# Patient Record
Sex: Female | Born: 1965 | Race: White | Hispanic: No | Marital: Married | Smoking: Former smoker
Health system: Southern US, Community
[De-identification: ages and names within clinical notes are randomized; demographics above are authoritative.]

## PROBLEM LIST (undated history)

## (undated) DIAGNOSIS — R5383 Other fatigue: Secondary | ICD-10-CM

## (undated) DIAGNOSIS — I219 Acute myocardial infarction, unspecified: Secondary | ICD-10-CM

## (undated) DIAGNOSIS — IMO0002 Reserved for concepts with insufficient information to code with codable children: Secondary | ICD-10-CM

## (undated) DIAGNOSIS — I25110 Atherosclerotic heart disease of native coronary artery with unstable angina pectoris: Secondary | ICD-10-CM

## (undated) DIAGNOSIS — Z951 Presence of aortocoronary bypass graft: Secondary | ICD-10-CM

## (undated) DIAGNOSIS — G35 Multiple sclerosis: Secondary | ICD-10-CM

## (undated) DIAGNOSIS — K589 Irritable bowel syndrome without diarrhea: Secondary | ICD-10-CM

## (undated) DIAGNOSIS — O24419 Gestational diabetes mellitus in pregnancy, unspecified control: Secondary | ICD-10-CM

## (undated) DIAGNOSIS — M51369 Other intervertebral disc degeneration, lumbar region without mention of lumbar back pain or lower extremity pain: Secondary | ICD-10-CM

## (undated) DIAGNOSIS — E669 Obesity, unspecified: Secondary | ICD-10-CM

## (undated) DIAGNOSIS — G35D Multiple sclerosis, unspecified: Secondary | ICD-10-CM

## (undated) DIAGNOSIS — M5136 Other intervertebral disc degeneration, lumbar region: Secondary | ICD-10-CM

## (undated) HISTORY — DX: Acute myocardial infarction, unspecified: I21.9

## (undated) HISTORY — DX: Gestational diabetes mellitus in pregnancy, unspecified control: O24.419

## (undated) HISTORY — PX: CHOLECYSTECTOMY: SHX55

## (undated) HISTORY — DX: Reserved for concepts with insufficient information to code with codable children: IMO0002

---

## 2001-05-15 ENCOUNTER — Other Ambulatory Visit: Admission: RE | Admit: 2001-05-15 | Discharge: 2001-05-15 | Payer: Self-pay | Admitting: Obstetrics and Gynecology

## 2002-03-14 ENCOUNTER — Emergency Department (HOSPITAL_COMMUNITY): Admission: EM | Admit: 2002-03-14 | Discharge: 2002-03-14 | Payer: Self-pay | Admitting: *Deleted

## 2002-03-26 DIAGNOSIS — IMO0002 Reserved for concepts with insufficient information to code with codable children: Secondary | ICD-10-CM

## 2002-03-26 DIAGNOSIS — R87619 Unspecified abnormal cytological findings in specimens from cervix uteri: Secondary | ICD-10-CM

## 2002-03-26 HISTORY — PX: CERVICAL CONE BIOPSY: SUR198

## 2002-03-26 HISTORY — DX: Unspecified abnormal cytological findings in specimens from cervix uteri: R87.619

## 2002-03-26 HISTORY — DX: Reserved for concepts with insufficient information to code with codable children: IMO0002

## 2002-03-26 HISTORY — PX: COLPOSCOPY: SHX161

## 2004-03-26 DIAGNOSIS — O24419 Gestational diabetes mellitus in pregnancy, unspecified control: Secondary | ICD-10-CM

## 2004-03-26 HISTORY — DX: Gestational diabetes mellitus in pregnancy, unspecified control: O24.419

## 2010-07-02 ENCOUNTER — Emergency Department (HOSPITAL_COMMUNITY): Payer: 59

## 2010-07-02 ENCOUNTER — Emergency Department (HOSPITAL_COMMUNITY)
Admission: EM | Admit: 2010-07-02 | Discharge: 2010-07-02 | Disposition: A | Discharge status: Home / Self Care | Payer: 59 | Attending: Emergency Medicine | Admitting: Emergency Medicine

## 2010-07-02 DIAGNOSIS — G35 Multiple sclerosis: Secondary | ICD-10-CM | POA: Insufficient documentation

## 2010-07-02 DIAGNOSIS — R0789 Other chest pain: Secondary | ICD-10-CM | POA: Insufficient documentation

## 2010-07-02 DIAGNOSIS — M546 Pain in thoracic spine: Secondary | ICD-10-CM | POA: Insufficient documentation

## 2010-07-02 LAB — COMPREHENSIVE METABOLIC PANEL
ALT: 27 U/L (ref 0–35)
AST: 28 U/L (ref 0–37)
Albumin: 3.3 g/dL — ABNORMAL LOW (ref 3.5–5.2)
Alkaline Phosphatase: 49 U/L (ref 39–117)
BUN: 7 mg/dL (ref 6–23)
CO2: 23 mEq/L (ref 19–32)
Calcium: 8.4 mg/dL (ref 8.4–10.5)
Chloride: 106 mEq/L (ref 96–112)
Creatinine, Ser: 0.53 mg/dL (ref 0.4–1.2)
GFR calc Af Amer: >60 mL/min (ref >60)
GFR calc non Af Amer: >60 mL/min (ref >60)
Glucose, Bld: 129 mg/dL — ABNORMAL HIGH (ref 70–99)
Potassium: 3.5 mEq/L (ref 3.5–5.1)
Sodium: 138 mEq/L (ref 135–145)
Total Bilirubin: 0.3 mg/dL (ref 0.3–1.2)
Total Protein: 6.4 g/dL (ref 6.0–8.3)

## 2010-07-02 LAB — URINALYSIS, ROUTINE W REFLEX MICROSCOPIC
Bilirubin Urine: NEGATIVE
Glucose, UA: NEGATIVE mg/dL
Hgb urine dipstick: NEGATIVE
Ketones, ur: NEGATIVE mg/dL
Nitrite: NEGATIVE
Protein, ur: NEGATIVE mg/dL
Specific Gravity, Urine: 1.013 (ref 1.005–1.030)
Urobilinogen, UA: 0.2 mg/dL (ref 0.0–1.0)
pH: 8 (ref 5.0–8.0)

## 2010-07-02 LAB — DIFFERENTIAL
Basophils Absolute: 0 10*3/uL (ref 0.0–0.1)
Basophils Relative: 0 % (ref 0–1)
Eosinophils Absolute: 0.1 10*3/uL (ref 0.0–0.7)
Eosinophils Relative: 3 % (ref 0–5)
Lymphocytes Relative: 29 % (ref 12–46)
Lymphs Abs: 1.3 10*3/uL (ref 0.7–4.0)
Monocytes Absolute: 0.5 10*3/uL (ref 0.1–1.0)
Monocytes Relative: 11 % (ref 3–12)
Neutro Abs: 2.6 10*3/uL (ref 1.7–7.7)
Neutrophils Relative %: 57 % (ref 43–77)

## 2010-07-02 LAB — CBC
HCT: 35.2 % — ABNORMAL LOW (ref 36.0–46.0)
Hemoglobin: 12.2 g/dL (ref 12.0–15.0)
MCH: 30.4 pg (ref 26.0–34.0)
MCHC: 34.7 g/dL (ref 30.0–36.0)
MCV: 87.8 fL (ref 78.0–100.0)
Platelets: 209 10*3/uL (ref 150–400)
RBC: 4.01 MIL/uL (ref 3.87–5.11)
RDW: 12.4 % (ref 11.5–15.5)
WBC: 4.6 10*3/uL (ref 4.0–10.5)

## 2010-07-02 LAB — POCT CARDIAC MARKERS
CKMB, poc: <1 ng/mL — ABNORMAL LOW (ref 1.0–8.0)
CKMB, poc: <1 ng/mL — ABNORMAL LOW (ref 1.0–8.0)
Myoglobin, poc: 35.5 ng/mL (ref 12–200)
Myoglobin, poc: 34.8 ng/mL (ref 12–200)
Troponin i, poc: <0.05 ng/mL (ref 0.00–0.09)
Troponin i, poc: <0.05 ng/mL (ref 0.00–0.09)

## 2010-07-02 LAB — D-DIMER, QUANTITATIVE: D-Dimer, Quant: 0.25 ug/mL-FEU (ref 0.00–0.48)

## 2010-07-02 LAB — POCT PREGNANCY, URINE: Preg Test, Ur: NEGATIVE

## 2010-07-03 LAB — URINE CULTURE
Colony Count: 75000
Culture  Setup Time: 201204082043

## 2011-06-20 ENCOUNTER — Other Ambulatory Visit: Payer: Self-pay | Admitting: Neurology

## 2011-07-02 ENCOUNTER — Other Ambulatory Visit: Payer: Self-pay | Admitting: Neurology

## 2011-07-04 ENCOUNTER — Other Ambulatory Visit: Payer: Self-pay | Admitting: Neurology

## 2011-07-04 ENCOUNTER — Encounter (HOSPITAL_COMMUNITY): Payer: Self-pay

## 2011-07-04 ENCOUNTER — Encounter (HOSPITAL_COMMUNITY)
Admission: RE | Admit: 2011-07-04 | Discharge: 2011-07-04 | Disposition: A | Discharge status: Home / Self Care | Payer: 59 | Source: Ambulatory Visit | Attending: Neurology | Admitting: Neurology

## 2011-07-04 DIAGNOSIS — G35 Multiple sclerosis: Secondary | ICD-10-CM | POA: Insufficient documentation

## 2011-07-04 HISTORY — DX: Multiple sclerosis, unspecified: G35.D

## 2011-07-04 HISTORY — DX: Multiple sclerosis: G35

## 2011-07-04 MED ORDER — DIPHENHYDRAMINE HCL 25 MG PO CAPS
ORAL_CAPSULE | ORAL | Status: AC
  Filled 2011-07-04: qty 1

## 2011-07-04 MED ORDER — LORATADINE 10 MG PO TABS
ORAL_TABLET | ORAL | Status: AC
  Administered 2011-07-04: 10 mg via ORAL
  Filled 2011-07-04: qty 1

## 2011-07-04 MED ORDER — SODIUM CHLORIDE 0.9 % IV SOLN
INTRAVENOUS | Status: AC
  Administered 2011-07-04: 11:00:00 via INTRAVENOUS

## 2011-07-04 MED ORDER — LORATADINE 10 MG PO TABS
10.0000 mg | ORAL_TABLET | Freq: Once | ORAL | Status: AC | PRN
  Administered 2011-07-04: 10 mg via ORAL

## 2011-07-04 MED ORDER — ACETAMINOPHEN 325 MG PO TABS
650.0000 mg | ORAL_TABLET | Freq: Four times a day (QID) | ORAL | Status: DC | PRN
  Administered 2011-07-04: 650 mg via ORAL

## 2011-07-04 MED ORDER — ACETAMINOPHEN 325 MG PO TABS
ORAL_TABLET | ORAL | Status: AC
  Administered 2011-07-04: 650 mg via ORAL
  Filled 2011-07-04: qty 2

## 2011-07-04 MED ORDER — DIPHENHYDRAMINE HCL 25 MG PO CAPS: 50.0000 mg | ORAL_CAPSULE | Freq: Four times a day (QID) | ORAL | Status: DC | PRN

## 2011-07-04 MED ORDER — SODIUM CHLORIDE 0.9 % IV SOLN
300.0000 mg | INTRAVENOUS | Status: DC
  Administered 2011-07-04: 300 mg via INTRAVENOUS
  Filled 2011-07-04: qty 15

## 2011-07-04 NOTE — Discharge Instructions (Signed)
Natalizumab injection What is this medicine? NATALIZUMAB (na ta LIZ you mab) is used to treat relapsing multiple sclerosis. This drug is not a cure. It is also used to treat Crohn's disease. This medicine may be used for other purposes; ask your health care provider or pharmacist if you have questions. What should I tell my health care provider before I take this medicine? They need to know if you have any of these conditions: -immune system problems -progressive multifocal leukoencephalopathy (PML) -an unusual or allergic reaction to natalizumab, other medicines, foods, dyes, or preservatives -pregnant or trying to get pregnant -breast-feeding How should I use this medicine? This medicine is for infusion into a vein. It is given by a health care professional in a hospital or clinic setting. A special MedGuide will be given to you by the pharmacist with each prescription and refill. Be sure to read this information carefully each time. Talk to your pediatrician regarding the use of this medicine in children. This medicine is not approved for use in children. Overdosage: If you think you have taken too much of this medicine contact a poison control center or emergency room at once. NOTE: This medicine is only for you. Do not share this medicine with others. What if I miss a dose? It is important not to miss your dose. Call your doctor or health care professional if you are unable to keep an appointment. What may interact with this medicine? -azathioprine -cyclosporine -interferon -6-mercaptopurine -methotrexate -steroid medicines like prednisone or cortisone -TNF-alpha inhibitors like adalimumab, etanercept, and infliximab -vaccines This list may not describe all possible interactions. Give your health care provider a list of all the medicines, herbs, non-prescription drugs, or dietary supplements you use. Also tell them if you smoke, drink alcohol, or use illegal drugs. Some items may  interact with your medicine. What should I watch for while using this medicine? Your condition will be monitored carefully while you are receiving this medicine. Visit your doctor for regular check ups. Tell your doctor or healthcare professional if your symptoms do not start to get better or if they get worse. Stay away from people who are sick. Call your doctor or health care professional for advice if you get a fever, chills or sore throat, or other symptoms of a cold or flu. Do not treat yourself. In some patients, this medicine may cause a serious brain infection that may cause death. If you have any problems seeing, thinking, speaking, walking, or standing, tell your doctor right away. If you cannot reach your doctor, get urgent medical care. What side effects may I notice from receiving this medicine? Side effects that you should report to your doctor or health care professional as soon as possible: -allergic reactions like skin rash, itching or hives, swelling of the face, lips, or tongue -breathing problems -changes in vision -chest pain -dark urine -depression, feelings of sadness -dizziness -general ill feeling or flu-like symptoms -irregular, missed, or painful menstrual periods -light-colored stools -loss of appetite, nausea -muscle weakness -problems with balance, talking, or walking -right upper belly pain -unusually weak or tired -yellowing of the eyes or skin Side effects that usually do not require medical attention (report to your doctor or health care professional if they continue or are bothersome): -aches, pains -headache -stomach upset -tiredness This list may not describe all possible side effects. Call your doctor for medical advice about side effects. You may report side effects to FDA at 1-800-FDA-1088. Where should I keep my medicine? This drug   is given in a hospital or clinic and will not be stored at home. NOTE: This sheet is a summary. It may not cover  all possible information. If you have questions about this medicine, talk to your doctor, pharmacist, or health care provider.  2012, Elsevier/Gold Standard. (05/01/2008 1:33:21 PM) 

## 2011-07-04 NOTE — Progress Notes (Signed)
Patient stable Tolerated infusion well. No signs of adverse reaction noted.

## 2011-07-31 ENCOUNTER — Other Ambulatory Visit: Payer: Self-pay | Admitting: Neurology

## 2011-08-01 ENCOUNTER — Encounter (HOSPITAL_COMMUNITY)
Admission: RE | Admit: 2011-08-01 | Discharge: 2011-08-01 | Disposition: A | Discharge status: Home / Self Care | Payer: 59 | Source: Ambulatory Visit | Attending: Neurology | Admitting: Neurology

## 2011-08-01 ENCOUNTER — Encounter (HOSPITAL_COMMUNITY): Payer: 59

## 2011-08-01 ENCOUNTER — Encounter (HOSPITAL_COMMUNITY): Payer: Self-pay

## 2011-08-01 DIAGNOSIS — G35 Multiple sclerosis: Secondary | ICD-10-CM | POA: Insufficient documentation

## 2011-08-01 MED ORDER — ACETAMINOPHEN 325 MG PO TABS
ORAL_TABLET | ORAL | Status: AC
  Administered 2011-08-01: 975 mg via ORAL
  Filled 2011-08-01: qty 3

## 2011-08-01 MED ORDER — LORATADINE 10 MG PO TABS
10.0000 mg | ORAL_TABLET | ORAL | Status: DC
  Administered 2011-08-01: 10 mg via ORAL

## 2011-08-01 MED ORDER — SODIUM CHLORIDE 0.9 % IV SOLN
INTRAVENOUS | Status: DC
  Administered 2011-08-01: 10:00:00 via INTRAVENOUS

## 2011-08-01 MED ORDER — ACETAMINOPHEN 500 MG PO TABS
1000.0000 mg | ORAL_TABLET | ORAL | Status: DC
  Administered 2011-08-01: 975 mg via ORAL

## 2011-08-01 MED ORDER — SODIUM CHLORIDE 0.9 % IV SOLN
300.0000 mg | INTRAVENOUS | Status: DC
  Administered 2011-08-01: 300 mg via INTRAVENOUS
  Filled 2011-08-01: qty 15

## 2011-08-01 MED ORDER — LORATADINE 10 MG PO TABS
ORAL_TABLET | ORAL | Status: AC
  Administered 2011-08-01: 10 mg via ORAL
  Filled 2011-08-01: qty 1

## 2011-08-01 NOTE — Progress Notes (Signed)
Tolerated Tysabri well. Verbalized understanding of d/c instructions. Home with daughter.

## 2011-08-01 NOTE — Discharge Instructions (Signed)
Natalizumab injection What is this medicine? NATALIZUMAB (na ta LIZ you mab) is used to treat relapsing multiple sclerosis. This drug is not a cure. It is also used to treat Crohn's disease. This medicine may be used for other purposes; ask your health care provider or pharmacist if you have questions. What should I tell my health care provider before I take this medicine? They need to know if you have any of these conditions: -immune system problems -progressive multifocal leukoencephalopathy (PML) -an unusual or allergic reaction to natalizumab, other medicines, foods, dyes, or preservatives -pregnant or trying to get pregnant -breast-feeding How should I use this medicine? This medicine is for infusion into a vein. It is given by a health care professional in a hospital or clinic setting. A special MedGuide will be given to you by the pharmacist with each prescription and refill. Be sure to read this information carefully each time. Talk to your pediatrician regarding the use of this medicine in children. This medicine is not approved for use in children. Overdosage: If you think you have taken too much of this medicine contact a poison control center or emergency room at once. NOTE: This medicine is only for you. Do not share this medicine with others. What if I miss a dose? It is important not to miss your dose. Call your doctor or health care professional if you are unable to keep an appointment. What may interact with this medicine? -azathioprine -cyclosporine -interferon -6-mercaptopurine -methotrexate -steroid medicines like prednisone or cortisone -TNF-alpha inhibitors like adalimumab, etanercept, and infliximab -vaccines This list may not describe all possible interactions. Give your health care provider a list of all the medicines, herbs, non-prescription drugs, or dietary supplements you use. Also tell them if you smoke, drink alcohol, or use illegal drugs. Some items may  interact with your medicine. What should I watch for while using this medicine? Your condition will be monitored carefully while you are receiving this medicine. Visit your doctor for regular check ups. Tell your doctor or healthcare professional if your symptoms do not start to get better or if they get worse. Stay away from people who are sick. Call your doctor or health care professional for advice if you get a fever, chills or sore throat, or other symptoms of a cold or flu. Do not treat yourself. In some patients, this medicine may cause a serious brain infection that may cause death. If you have any problems seeing, thinking, speaking, walking, or standing, tell your doctor right away. If you cannot reach your doctor, get urgent medical care. What side effects may I notice from receiving this medicine? Side effects that you should report to your doctor or health care professional as soon as possible: -allergic reactions like skin rash, itching or hives, swelling of the face, lips, or tongue -breathing problems -changes in vision -chest pain -dark urine -depression, feelings of sadness -dizziness -general ill feeling or flu-like symptoms -irregular, missed, or painful menstrual periods -light-colored stools -loss of appetite, nausea -muscle weakness -problems with balance, talking, or walking -right upper belly pain -unusually weak or tired -yellowing of the eyes or skin Side effects that usually do not require medical attention (report to your doctor or health care professional if they continue or are bothersome): -aches, pains -headache -stomach upset -tiredness This list may not describe all possible side effects. Call your doctor for medical advice about side effects. You may report side effects to FDA at 1-800-FDA-1088. Where should I keep my medicine? This drug   is given in a hospital or clinic and will not be stored at home. NOTE: This sheet is a summary. It may not cover  all possible information. If you have questions about this medicine, talk to your doctor, pharmacist, or health care provider.  2012, Elsevier/Gold Standard. (05/01/2008 1:33:21 PM) 

## 2011-08-06 DIAGNOSIS — D069 Carcinoma in situ of cervix, unspecified: Secondary | ICD-10-CM | POA: Insufficient documentation

## 2011-08-28 ENCOUNTER — Other Ambulatory Visit (HOSPITAL_COMMUNITY): Payer: Self-pay | Admitting: *Deleted

## 2011-08-29 ENCOUNTER — Encounter (HOSPITAL_COMMUNITY)
Admission: RE | Admit: 2011-08-29 | Discharge: 2011-08-29 | Disposition: A | Discharge status: Home / Self Care | Payer: 59 | Source: Ambulatory Visit | Attending: Neurology | Admitting: Neurology

## 2011-08-29 ENCOUNTER — Encounter (HOSPITAL_COMMUNITY): Payer: Self-pay

## 2011-08-29 DIAGNOSIS — G35 Multiple sclerosis: Secondary | ICD-10-CM | POA: Insufficient documentation

## 2011-08-29 MED ORDER — LORATADINE 10 MG PO TABS
ORAL_TABLET | ORAL | Status: AC
  Filled 2011-08-29: qty 1

## 2011-08-29 MED ORDER — SODIUM CHLORIDE 0.9 % IV SOLN
INTRAVENOUS | Status: DC
  Administered 2011-08-29: 250 mL via INTRAVENOUS

## 2011-08-29 MED ORDER — ACETAMINOPHEN 500 MG PO TABS
ORAL_TABLET | ORAL | Status: AC
  Filled 2011-08-29: qty 2

## 2011-08-29 MED ORDER — LORATADINE 10 MG PO TABS
10.0000 mg | ORAL_TABLET | Freq: Once | ORAL | Status: AC
  Administered 2011-08-29: 10 mg via ORAL

## 2011-08-29 MED ORDER — SODIUM CHLORIDE 0.9 % IV SOLN
300.0000 mg | INTRAVENOUS | Status: DC
  Administered 2011-08-29: 300 mg via INTRAVENOUS
  Filled 2011-08-29: qty 15

## 2011-08-29 MED ORDER — ACETAMINOPHEN 500 MG PO TABS
1000.0000 mg | ORAL_TABLET | ORAL | Status: DC
  Administered 2011-08-29: 1000 mg via ORAL

## 2011-08-29 MED ORDER — LORATADINE 5 MG/5ML PO SYRP: 10.0000 mg | ORAL_SOLUTION | ORAL | Status: DC

## 2011-08-29 NOTE — Discharge Instructions (Signed)
Natalizumab injection What is this medicine? NATALIZUMAB (na ta LIZ you mab) is used to treat relapsing multiple sclerosis. This drug is not a cure. It is also used to treat Crohn's disease. This medicine may be used for other purposes; ask your health care provider or pharmacist if you have questions. What should I tell my health care provider before I take this medicine? They need to know if you have any of these conditions: -immune system problems -progressive multifocal leukoencephalopathy (PML) -an unusual or allergic reaction to natalizumab, other medicines, foods, dyes, or preservatives -pregnant or trying to get pregnant -breast-feeding How should I use this medicine? This medicine is for infusion into a vein. It is given by a health care professional in a hospital or clinic setting. A special MedGuide will be given to you by the pharmacist with each prescription and refill. Be sure to read this information carefully each time. Talk to your pediatrician regarding the use of this medicine in children. This medicine is not approved for use in children. Overdosage: If you think you have taken too much of this medicine contact a poison control center or emergency room at once. NOTE: This medicine is only for you. Do not share this medicine with others. What if I miss a dose? It is important not to miss your dose. Call your doctor or health care professional if you are unable to keep an appointment. What may interact with this medicine? -azathioprine -cyclosporine -interferon -6-mercaptopurine -methotrexate -steroid medicines like prednisone or cortisone -TNF-alpha inhibitors like adalimumab, etanercept, and infliximab -vaccines This list may not describe all possible interactions. Give your health care provider a list of all the medicines, herbs, non-prescription drugs, or dietary supplements you use. Also tell them if you smoke, drink alcohol, or use illegal drugs. Some items may  interact with your medicine. What should I watch for while using this medicine? Your condition will be monitored carefully while you are receiving this medicine. Visit your doctor for regular check ups. Tell your doctor or healthcare professional if your symptoms do not start to get better or if they get worse. Stay away from people who are sick. Call your doctor or health care professional for advice if you get a fever, chills or sore throat, or other symptoms of a cold or flu. Do not treat yourself. In some patients, this medicine may cause a serious brain infection that may cause death. If you have any problems seeing, thinking, speaking, walking, or standing, tell your doctor right away. If you cannot reach your doctor, get urgent medical care. What side effects may I notice from receiving this medicine? Side effects that you should report to your doctor or health care professional as soon as possible: -allergic reactions like skin rash, itching or hives, swelling of the face, lips, or tongue -breathing problems -changes in vision -chest pain -dark urine -depression, feelings of sadness -dizziness -general ill feeling or flu-like symptoms -irregular, missed, or painful menstrual periods -light-colored stools -loss of appetite, nausea -muscle weakness -problems with balance, talking, or walking -right upper belly pain -unusually weak or tired -yellowing of the eyes or skin Side effects that usually do not require medical attention (report to your doctor or health care professional if they continue or are bothersome): -aches, pains -headache -stomach upset -tiredness This list may not describe all possible side effects. Call your doctor for medical advice about side effects. You may report side effects to FDA at 1-800-FDA-1088. Where should I keep my medicine? This drug   is given in a hospital or clinic and will not be stored at home. NOTE: This sheet is a summary. It may not cover  all possible information. If you have questions about this medicine, talk to your doctor, pharmacist, or health care provider.  2012, Elsevier/Gold Standard. (05/01/2008 1:33:21 PM) 

## 2011-09-26 ENCOUNTER — Encounter (HOSPITAL_COMMUNITY)
Admission: RE | Admit: 2011-09-26 | Discharge: 2011-09-26 | Disposition: A | Discharge status: Home / Self Care | Payer: 59 | Source: Ambulatory Visit | Attending: Neurology | Admitting: Neurology

## 2011-09-26 ENCOUNTER — Encounter (HOSPITAL_COMMUNITY): Payer: Self-pay

## 2011-09-26 DIAGNOSIS — G35 Multiple sclerosis: Secondary | ICD-10-CM | POA: Insufficient documentation

## 2011-09-26 HISTORY — DX: Other fatigue: R53.83

## 2011-09-26 MED ORDER — ACETAMINOPHEN 500 MG PO TABS
ORAL_TABLET | ORAL | Status: AC
  Filled 2011-09-26: qty 2

## 2011-09-26 MED ORDER — LORATADINE 10 MG PO TABS
10.0000 mg | ORAL_TABLET | Freq: Every day | ORAL | Status: DC
  Administered 2011-09-26: 10 mg via ORAL

## 2011-09-26 MED ORDER — LORATADINE 10 MG PO TABS
ORAL_TABLET | ORAL | Status: AC
  Filled 2011-09-26: qty 1

## 2011-09-26 MED ORDER — SODIUM CHLORIDE 0.9 % IV SOLN
INTRAVENOUS | Status: DC
  Administered 2011-09-26: 10:00:00 via INTRAVENOUS

## 2011-09-26 MED ORDER — ACETAMINOPHEN 500 MG PO TABS
1000.0000 mg | ORAL_TABLET | ORAL | Status: DC
  Administered 2011-09-26: 1000 mg via ORAL

## 2011-09-26 MED ORDER — SODIUM CHLORIDE 0.9 % IV SOLN
300.0000 mg | INTRAVENOUS | Status: DC
  Administered 2011-09-26: 300 mg via INTRAVENOUS
  Filled 2011-09-26: qty 15

## 2011-10-24 ENCOUNTER — Encounter (HOSPITAL_COMMUNITY): Payer: Self-pay

## 2011-10-24 ENCOUNTER — Encounter (HOSPITAL_COMMUNITY)
Admission: RE | Admit: 2011-10-24 | Discharge: 2011-10-24 | Disposition: A | Discharge status: Home / Self Care | Payer: 59 | Source: Ambulatory Visit | Attending: Neurology | Admitting: Neurology

## 2011-10-24 MED ORDER — LORATADINE 10 MG PO TABS
10.0000 mg | ORAL_TABLET | Freq: Every day | ORAL | Status: DC
  Administered 2011-10-24: 10 mg via ORAL
  Filled 2011-10-24: qty 1

## 2011-10-24 MED ORDER — EPINEPHRINE 0.3 MG/0.3ML IJ DEVI
0.3000 mg | Freq: Once | INTRAMUSCULAR | Status: DC
  Filled 2011-10-24: qty 0.6

## 2011-10-24 MED ORDER — SODIUM CHLORIDE 0.9 % IV SOLN
500.0000 mg | Freq: Once | INTRAVENOUS | Status: DC
  Filled 2011-10-24: qty 4

## 2011-10-24 MED ORDER — ACETAMINOPHEN 500 MG PO TABS
1000.0000 mg | ORAL_TABLET | ORAL | Status: AC
  Administered 2011-10-24: 1000 mg via ORAL
  Filled 2011-10-24: qty 2

## 2011-10-24 MED ORDER — SODIUM CHLORIDE 0.9 % IV SOLN
300.0000 mg | INTRAVENOUS | Status: AC
  Administered 2011-10-24: 300 mg via INTRAVENOUS
  Filled 2011-10-24: qty 15

## 2011-10-24 MED ORDER — SODIUM CHLORIDE 0.9 % IV SOLN
INTRAVENOUS | Status: AC
  Administered 2011-10-24: 10:00:00 via INTRAVENOUS

## 2011-11-21 ENCOUNTER — Encounter (HOSPITAL_COMMUNITY): Payer: Self-pay

## 2011-11-21 ENCOUNTER — Encounter (HOSPITAL_COMMUNITY)
Admission: RE | Admit: 2011-11-21 | Discharge: 2011-11-21 | Disposition: A | Discharge status: Home / Self Care | Payer: 59 | Source: Ambulatory Visit | Attending: Neurology | Admitting: Neurology

## 2011-11-21 DIAGNOSIS — G35 Multiple sclerosis: Secondary | ICD-10-CM | POA: Insufficient documentation

## 2011-11-21 MED ORDER — LORATADINE 10 MG PO TABS
10.0000 mg | ORAL_TABLET | ORAL | Status: DC
  Administered 2011-11-21: 10 mg via ORAL
  Filled 2011-11-21: qty 1

## 2011-11-21 MED ORDER — SODIUM CHLORIDE 0.9 % IV SOLN
INTRAVENOUS | Status: DC
  Administered 2011-11-21: 250 mL via INTRAVENOUS

## 2011-11-21 MED ORDER — SODIUM CHLORIDE 0.9 % IV SOLN
300.0000 mg | INTRAVENOUS | Status: DC
  Administered 2011-11-21: 300 mg via INTRAVENOUS
  Filled 2011-11-21: qty 15

## 2011-11-21 MED ORDER — ACETAMINOPHEN 500 MG PO TABS
1000.0000 mg | ORAL_TABLET | ORAL | Status: DC
  Administered 2011-11-21: 1000 mg via ORAL
  Filled 2011-11-21: qty 2

## 2011-12-19 ENCOUNTER — Encounter (HOSPITAL_COMMUNITY)
Admission: RE | Admit: 2011-12-19 | Discharge: 2011-12-19 | Disposition: A | Discharge status: Home / Self Care | Payer: 59 | Source: Ambulatory Visit | Attending: Neurology | Admitting: Neurology

## 2011-12-19 ENCOUNTER — Encounter (HOSPITAL_COMMUNITY): Payer: Self-pay

## 2011-12-19 DIAGNOSIS — G35 Multiple sclerosis: Secondary | ICD-10-CM | POA: Insufficient documentation

## 2011-12-19 MED ORDER — SODIUM CHLORIDE 0.9 % IV SOLN
300.0000 mg | INTRAVENOUS | Status: DC
  Administered 2011-12-19: 300 mg via INTRAVENOUS
  Filled 2011-12-19: qty 15

## 2011-12-19 MED ORDER — SODIUM CHLORIDE 0.9 % IV SOLN
INTRAVENOUS | Status: DC
  Administered 2011-12-19: 11:00:00 via INTRAVENOUS

## 2011-12-19 MED ORDER — ACETAMINOPHEN 500 MG PO TABS
1000.0000 mg | ORAL_TABLET | ORAL | Status: DC
  Administered 2011-12-19: 1000 mg via ORAL
  Filled 2011-12-19: qty 2

## 2011-12-19 MED ORDER — LORATADINE 10 MG PO TABS
10.0000 mg | ORAL_TABLET | ORAL | Status: DC
  Administered 2011-12-19: 10 mg via ORAL
  Filled 2011-12-19: qty 1

## 2011-12-19 NOTE — Progress Notes (Signed)
Pt's "runny nose" remains unchanged and still afebrile. She request to leave 30 minutes earlier than usual 1 hour observation period post Tysabri infusion because she wanted "to go home and take some allergy medication"

## 2011-12-19 NOTE — Progress Notes (Signed)
Pt has a "runny nose' clear secretion and is afebrile." Feels like it is allergy". Pt spoke with Dr Terrace Arabia and is to proceed with Tysabri unless she has a fever

## 2012-01-16 ENCOUNTER — Encounter (HOSPITAL_COMMUNITY)
Admission: RE | Admit: 2012-01-16 | Discharge: 2012-01-16 | Disposition: A | Discharge status: Home / Self Care | Payer: 59 | Source: Ambulatory Visit | Attending: Neurology | Admitting: Neurology

## 2012-01-16 ENCOUNTER — Encounter (HOSPITAL_COMMUNITY): Payer: Self-pay

## 2012-01-16 DIAGNOSIS — G35 Multiple sclerosis: Secondary | ICD-10-CM | POA: Insufficient documentation

## 2012-01-16 MED ORDER — LORATADINE 10 MG PO TABS
10.0000 mg | ORAL_TABLET | ORAL | Status: AC
  Administered 2012-01-16: 10 mg via ORAL
  Filled 2012-01-16: qty 1

## 2012-01-16 MED ORDER — ACETAMINOPHEN 500 MG PO TABS
1000.0000 mg | ORAL_TABLET | ORAL | Status: AC
  Administered 2012-01-16: 1000 mg via ORAL
  Filled 2012-01-16: qty 2

## 2012-01-16 MED ORDER — SODIUM CHLORIDE 0.9 % IV SOLN
300.0000 mg | INTRAVENOUS | Status: AC
  Administered 2012-01-16: 300 mg via INTRAVENOUS
  Filled 2012-01-16: qty 15

## 2012-01-16 MED ORDER — SODIUM CHLORIDE 0.9 % IV SOLN
INTRAVENOUS | Status: AC
  Administered 2012-01-16: 10:00:00 via INTRAVENOUS

## 2012-01-16 NOTE — Progress Notes (Addendum)
Post TYSABRI infusion. Pt declined to stay the entire 60 minutes observation time. Pt and daughter left after 30 minutes post infusion

## 2012-02-13 ENCOUNTER — Encounter (HOSPITAL_COMMUNITY): Payer: Self-pay

## 2012-02-13 ENCOUNTER — Encounter (HOSPITAL_COMMUNITY)
Admission: RE | Admit: 2012-02-13 | Discharge: 2012-02-13 | Disposition: A | Discharge status: Home / Self Care | Payer: 59 | Source: Ambulatory Visit | Attending: Neurology | Admitting: Neurology

## 2012-02-13 DIAGNOSIS — G35 Multiple sclerosis: Secondary | ICD-10-CM | POA: Insufficient documentation

## 2012-02-13 MED ORDER — SODIUM CHLORIDE 0.9 % IV SOLN
INTRAVENOUS | Status: AC
  Administered 2012-02-13: 10:00:00 via INTRAVENOUS

## 2012-02-13 MED ORDER — SODIUM CHLORIDE 0.9 % IV SOLN
300.0000 mg | INTRAVENOUS | Status: AC
  Administered 2012-02-13: 300 mg via INTRAVENOUS
  Filled 2012-02-13: qty 15

## 2012-02-13 MED ORDER — LORATADINE 10 MG PO TABS
10.0000 mg | ORAL_TABLET | ORAL | Status: AC
  Administered 2012-02-13: 10 mg via ORAL
  Filled 2012-02-13: qty 1

## 2012-02-13 MED ORDER — ACETAMINOPHEN 500 MG PO TABS
1000.0000 mg | ORAL_TABLET | ORAL | Status: AC
  Administered 2012-02-13: 1000 mg via ORAL
  Filled 2012-02-13: qty 2

## 2012-02-13 NOTE — Progress Notes (Signed)
Patient and daughter stayed 30 minutes post Tysabri infusion. Patient stated she "felt fine" and was unable to stay the remaining 30 minutes for observation. Home with daughter.

## 2012-03-10 ENCOUNTER — Other Ambulatory Visit: Payer: Self-pay | Admitting: Neurology

## 2012-03-12 ENCOUNTER — Encounter (HOSPITAL_COMMUNITY): Payer: Self-pay

## 2012-03-12 ENCOUNTER — Encounter (HOSPITAL_COMMUNITY)
Admission: RE | Admit: 2012-03-12 | Discharge: 2012-03-12 | Disposition: A | Discharge status: Home / Self Care | Payer: 59 | Source: Ambulatory Visit | Attending: Neurology | Admitting: Neurology

## 2012-03-12 DIAGNOSIS — G35 Multiple sclerosis: Secondary | ICD-10-CM | POA: Insufficient documentation

## 2012-03-12 MED ORDER — LORATADINE 10 MG PO TABS
10.0000 mg | ORAL_TABLET | ORAL | Status: DC
Start: 2012-03-12 — End: 2012-03-13
  Administered 2012-03-12: 10 mg via ORAL
  Filled 2012-03-12: qty 1

## 2012-03-12 MED ORDER — SODIUM CHLORIDE 0.9 % IV SOLN
300.0000 mg | INTRAVENOUS | Status: DC
  Administered 2012-03-12: 300 mg via INTRAVENOUS
  Filled 2012-03-12: qty 15

## 2012-03-12 MED ORDER — ACETAMINOPHEN 500 MG PO TABS
1000.0000 mg | ORAL_TABLET | ORAL | Status: DC
  Administered 2012-03-12: 1000 mg via ORAL
  Filled 2012-03-12: qty 2

## 2012-03-12 MED ORDER — METHYLPREDNISOLONE SODIUM SUCC 1000 MG IJ SOLR
500.0000 mg | INTRAMUSCULAR | Status: DC
  Filled 2012-03-12: qty 4

## 2012-03-12 MED ORDER — SODIUM CHLORIDE 0.9 % IV SOLN
INTRAVENOUS | Status: DC
  Administered 2012-03-12: 10:00:00 via INTRAVENOUS

## 2012-03-12 MED ORDER — EPINEPHRINE 0.3 MG/0.3ML IJ DEVI
0.3000 mg | INTRAMUSCULAR | Status: DC
  Filled 2012-03-12: qty 0.6

## 2012-04-09 ENCOUNTER — Encounter (HOSPITAL_COMMUNITY)
Admission: RE | Admit: 2012-04-09 | Discharge: 2012-04-09 | Disposition: A | Discharge status: Home / Self Care | Payer: Managed Care, Other (non HMO) | Source: Ambulatory Visit | Attending: Neurology | Admitting: Neurology

## 2012-04-09 ENCOUNTER — Encounter (HOSPITAL_COMMUNITY): Payer: Self-pay

## 2012-04-09 DIAGNOSIS — G35 Multiple sclerosis: Secondary | ICD-10-CM | POA: Insufficient documentation

## 2012-04-09 MED ORDER — LORATADINE 10 MG PO TABS
10.0000 mg | ORAL_TABLET | ORAL | Status: DC
  Administered 2012-04-09: 10 mg via ORAL
  Filled 2012-04-09: qty 1

## 2012-04-09 MED ORDER — ACETAMINOPHEN 500 MG PO TABS
1000.0000 mg | ORAL_TABLET | ORAL | Status: DC
  Administered 2012-04-09: 1000 mg via ORAL
  Filled 2012-04-09: qty 2

## 2012-04-09 MED ORDER — NATALIZUMAB 300 MG/15ML IV CONC
300.0000 mg | INTRAVENOUS | Status: DC
  Administered 2012-04-09: 300 mg via INTRAVENOUS
  Filled 2012-04-09: qty 15

## 2012-04-09 MED ORDER — SODIUM CHLORIDE 0.9 % IV SOLN
INTRAVENOUS | Status: DC
  Administered 2012-04-09: 10:00:00 via INTRAVENOUS

## 2012-04-09 NOTE — Progress Notes (Addendum)
Tolerated Tysabri well. No adverse reactions noted. Patient and daughter stayed 35 minutes post infusion. Declined to stay 60 minutes. Stated she felt fine and "really needed to leave after 30 minutes today." Instructed to call physician for concerns once she leaves Short Stay.

## 2012-05-09 ENCOUNTER — Encounter (HOSPITAL_COMMUNITY): Payer: Self-pay

## 2012-05-09 ENCOUNTER — Encounter (HOSPITAL_COMMUNITY)
Admission: RE | Admit: 2012-05-09 | Discharge: 2012-05-09 | Disposition: A | Discharge status: Home / Self Care | Payer: Managed Care, Other (non HMO) | Source: Ambulatory Visit | Attending: Neurology | Admitting: Neurology

## 2012-05-09 DIAGNOSIS — G35 Multiple sclerosis: Secondary | ICD-10-CM | POA: Insufficient documentation

## 2012-05-09 MED ORDER — ACETAMINOPHEN 500 MG PO TABS
1000.0000 mg | ORAL_TABLET | ORAL | Status: AC
  Administered 2012-05-09: 1000 mg via ORAL
  Filled 2012-05-09: qty 2

## 2012-05-09 MED ORDER — SODIUM CHLORIDE 0.9 % IV SOLN
INTRAVENOUS | Status: DC
  Administered 2012-05-09: 10:00:00 via INTRAVENOUS

## 2012-05-09 MED ORDER — LORATADINE 10 MG PO TABS
10.0000 mg | ORAL_TABLET | ORAL | Status: AC
  Administered 2012-05-09: 10 mg via ORAL
  Filled 2012-05-09: qty 1

## 2012-05-09 MED ORDER — SODIUM CHLORIDE 0.9 % IV SOLN
300.0000 mg | INTRAVENOUS | Status: AC
  Administered 2012-05-09: 300 mg via INTRAVENOUS
  Filled 2012-05-09: qty 15

## 2012-06-03 ENCOUNTER — Other Ambulatory Visit: Payer: Self-pay | Admitting: Neurology

## 2012-06-03 MED ORDER — SODIUM CHLORIDE 0.9 % IV SOLN: 300.0000 mg | INTRAVENOUS | Status: DC

## 2012-06-05 ENCOUNTER — Other Ambulatory Visit (HOSPITAL_COMMUNITY): Payer: Self-pay | Admitting: Neurology

## 2012-06-06 ENCOUNTER — Encounter (HOSPITAL_COMMUNITY): Payer: Self-pay

## 2012-06-06 ENCOUNTER — Encounter (HOSPITAL_COMMUNITY)
Admission: RE | Admit: 2012-06-06 | Discharge: 2012-06-06 | Disposition: A | Discharge status: Home / Self Care | Payer: Managed Care, Other (non HMO) | Source: Ambulatory Visit | Attending: Neurology | Admitting: Neurology

## 2012-06-06 DIAGNOSIS — G35 Multiple sclerosis: Secondary | ICD-10-CM | POA: Insufficient documentation

## 2012-06-06 MED ORDER — LORATADINE 10 MG PO TABS
10.0000 mg | ORAL_TABLET | ORAL | Status: DC
  Administered 2012-06-06: 10 mg via ORAL
  Filled 2012-06-06: qty 1

## 2012-06-06 MED ORDER — ACETAMINOPHEN 500 MG PO TABS
1000.0000 mg | ORAL_TABLET | ORAL | Status: DC
  Administered 2012-06-06: 1000 mg via ORAL
  Filled 2012-06-06: qty 2

## 2012-06-06 MED ORDER — SODIUM CHLORIDE 0.9 % IV SOLN
INTRAVENOUS | Status: DC
  Administered 2012-06-06: 10:00:00 via INTRAVENOUS

## 2012-06-06 MED ORDER — SODIUM CHLORIDE 0.9 % IV SOLN
300.0000 mg | INTRAVENOUS | Status: DC
  Administered 2012-06-06: 300 mg via INTRAVENOUS
  Filled 2012-06-06: qty 15

## 2012-06-06 NOTE — Progress Notes (Signed)
Post TYSABRI infusion. Pt does not want to stay the suggested 1 hour observation post infusion, She voices no c/o

## 2012-06-24 ENCOUNTER — Other Ambulatory Visit (HOSPITAL_COMMUNITY): Payer: Self-pay | Admitting: Neurology

## 2012-07-04 ENCOUNTER — Encounter (HOSPITAL_COMMUNITY)
Admission: RE | Admit: 2012-07-04 | Discharge: 2012-07-04 | Disposition: A | Discharge status: Home / Self Care | Payer: Managed Care, Other (non HMO) | Source: Ambulatory Visit | Attending: Neurology | Admitting: Neurology

## 2012-07-04 ENCOUNTER — Encounter (HOSPITAL_COMMUNITY): Payer: Self-pay

## 2012-07-04 DIAGNOSIS — G35 Multiple sclerosis: Secondary | ICD-10-CM | POA: Insufficient documentation

## 2012-07-04 MED ORDER — ACETAMINOPHEN 500 MG PO TABS
1000.0000 mg | ORAL_TABLET | ORAL | Status: DC
  Administered 2012-07-04: 1000 mg via ORAL
  Filled 2012-07-04: qty 2

## 2012-07-04 MED ORDER — LORATADINE 10 MG PO TABS
10.0000 mg | ORAL_TABLET | ORAL | Status: DC
  Administered 2012-07-04: 10 mg via ORAL
  Filled 2012-07-04: qty 1

## 2012-07-04 MED ORDER — SODIUM CHLORIDE 0.9 % IV SOLN
INTRAVENOUS | Status: DC
Start: 2012-07-04 — End: 2012-07-05
  Administered 2012-07-04: 20 mL/h via INTRAVENOUS

## 2012-07-04 MED ORDER — SODIUM CHLORIDE 0.9 % IV SOLN
300.0000 mg | INTRAVENOUS | Status: DC
  Administered 2012-07-04: 300 mg via INTRAVENOUS
  Filled 2012-07-04: qty 15

## 2012-07-31 ENCOUNTER — Telehealth: Payer: Self-pay | Admitting: *Deleted

## 2012-08-01 ENCOUNTER — Ambulatory Visit: Payer: Self-pay | Admitting: Nurse Practitioner

## 2012-08-01 ENCOUNTER — Encounter (HOSPITAL_COMMUNITY)
Admission: RE | Admit: 2012-08-01 | Discharge: 2012-08-01 | Disposition: A | Discharge status: Home / Self Care | Payer: Managed Care, Other (non HMO) | Source: Ambulatory Visit | Attending: Neurology | Admitting: Neurology

## 2012-08-01 ENCOUNTER — Encounter (HOSPITAL_COMMUNITY): Payer: Self-pay

## 2012-08-01 DIAGNOSIS — G35 Multiple sclerosis: Secondary | ICD-10-CM | POA: Insufficient documentation

## 2012-08-01 MED ORDER — SODIUM CHLORIDE 0.9 % IV SOLN
INTRAVENOUS | Status: DC
  Administered 2012-08-01: 11:00:00 via INTRAVENOUS

## 2012-08-01 MED ORDER — LORATADINE 10 MG PO TABS
10.0000 mg | ORAL_TABLET | ORAL | Status: DC
  Administered 2012-08-01: 10 mg via ORAL
  Filled 2012-08-01: qty 1

## 2012-08-01 MED ORDER — ACETAMINOPHEN 500 MG PO TABS
1000.0000 mg | ORAL_TABLET | ORAL | Status: DC
  Administered 2012-08-01: 1000 mg via ORAL
  Filled 2012-08-01: qty 2

## 2012-08-01 MED ORDER — SODIUM CHLORIDE 0.9 % IV SOLN
300.0000 mg | INTRAVENOUS | Status: DC
  Administered 2012-08-01: 300 mg via INTRAVENOUS
  Filled 2012-08-01: qty 15

## 2012-08-01 NOTE — Progress Notes (Signed)
Post TYSABRI infusion. Patient declines to stay the 1 hour post infusion observation period.

## 2012-08-19 NOTE — Telephone Encounter (Signed)
Closing encounter

## 2012-08-29 ENCOUNTER — Encounter (HOSPITAL_COMMUNITY): Payer: Self-pay

## 2012-08-29 ENCOUNTER — Encounter (HOSPITAL_COMMUNITY)
Admission: RE | Admit: 2012-08-29 | Discharge: 2012-08-29 | Disposition: A | Discharge status: Home / Self Care | Payer: Managed Care, Other (non HMO) | Source: Ambulatory Visit | Attending: Neurology | Admitting: Neurology

## 2012-08-29 DIAGNOSIS — G35 Multiple sclerosis: Secondary | ICD-10-CM | POA: Insufficient documentation

## 2012-08-29 MED ORDER — LORATADINE 10 MG PO TABS
10.0000 mg | ORAL_TABLET | ORAL | Status: AC
  Administered 2012-08-29: 10 mg via ORAL
  Filled 2012-08-29: qty 1

## 2012-08-29 MED ORDER — SODIUM CHLORIDE 0.9 % IV SOLN
INTRAVENOUS | Status: AC
  Administered 2012-08-29: 09:00:00 via INTRAVENOUS

## 2012-08-29 MED ORDER — SODIUM CHLORIDE 0.9 % IV SOLN
300.0000 mg | INTRAVENOUS | Status: AC
  Administered 2012-08-29: 300 mg via INTRAVENOUS
  Filled 2012-08-29: qty 15

## 2012-08-29 MED ORDER — ACETAMINOPHEN 500 MG PO TABS
ORAL_TABLET | ORAL | Status: AC
  Administered 2012-08-29: 1000 mg via ORAL
  Filled 2012-08-29: qty 2

## 2012-08-29 MED ORDER — ACETAMINOPHEN 500 MG PO TABS: 1000.0000 mg | ORAL_TABLET | ORAL | Status: AC

## 2012-09-01 ENCOUNTER — Encounter: Payer: Self-pay | Admitting: Neurology

## 2012-09-02 ENCOUNTER — Ambulatory Visit (INDEPENDENT_AMBULATORY_CARE_PROVIDER_SITE_OTHER): Payer: Managed Care, Other (non HMO) | Admitting: Neurology

## 2012-09-02 ENCOUNTER — Encounter: Payer: Self-pay | Admitting: Neurology

## 2012-09-02 VITALS — BP 111/69 | HR 71 | Ht 64.0 in | Wt 235.5 lb

## 2012-09-02 DIAGNOSIS — R269 Unspecified abnormalities of gait and mobility: Secondary | ICD-10-CM

## 2012-09-02 DIAGNOSIS — M25569 Pain in unspecified knee: Secondary | ICD-10-CM

## 2012-09-02 DIAGNOSIS — M25562 Pain in left knee: Secondary | ICD-10-CM

## 2012-09-02 DIAGNOSIS — G35 Multiple sclerosis: Secondary | ICD-10-CM

## 2012-09-02 MED ORDER — METHYLPHENIDATE HCL 10 MG PO TABS: 10.0000 mg | ORAL_TABLET | Freq: Three times a day (TID) | ORAL | Status: DC

## 2012-09-02 NOTE — Progress Notes (Signed)
History of Present Illness:   Kaylee Bryant is a 47 RH WF for continued care of RRMS.   She was diagnosed with RRMS in 2007, 6 month post partum, she had numbness from waist done, diagnosis was confirmed by marked abnormal MRI, I reviewed the film from [facility] in March,2007. There were multiple T2/Flair periventricular lesions, corpus callosum involvement, mutiple ring enhancing lesions. Also had enhancing lesion in cervical and thoracic spines.  She was treated at [facility] by Dr. Tinnie Gens, no csf study, but had extensive lab evaluations to rule out mimics.  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  She denies visual changes or other clear relapsing episodes.   MRI was done by Dr. Ninetta Lights in 01/2007, I have report only,MRI  brain, multiple T2/FLAIR lesions in both hemisphere with corpus callosum involvement, there are slight increase in lesion load. MRI cervical, stable lesions in C6-7, and C2-3. Thoracic, multiple non enhancing lesions.  She tolerated Rebif well, no flu like illness, but  complains of bilateral arm, and the lateral thigh lipoatrophy with repeat Rebif injection. There was no clear relapsed over the years, but she has slow worsening gait difficulty over the past 6 months.  Most recent MRI in Sept 2012,  MRI of thoracic spine Multiple chronic plaques at C7, T6-7, T8-9, T10.  MRI of cervical spine ,Multiple chronic demyelinating plaques at C3, C4 and C7. MRI of brain Multiple round and ovioid, periventricular and subcortical chronic demyelinating plaques.  2 plaques noted in the pons.  No abnormal lesions are seen on post contrast views.    Her JC-virus antibody was negative. After discuss, we started her on Tysarbri infusion since summer of 2013.  She  tolerated the infusion very well, but complains of excessive fatigue over the past few months, she still able to work full-time as a Engineer, civil (consulting), she complains of difficulty dragging herself out of bed.  Modafinil was not approved by insurance.She was put on Ritalin 10mg  tid, which helped her fatigue. feels much better on Tysabri.    UPDATE June 10th 2014: She fell twice a left knee since 2013, now presenting with 2 weeks history of left knee pain, worsening gait difficulty because of that, dragging her left leg more, she is [place]   Review of Systems  Out of a complete 14 system review, the patient complains of only the following symptoms, and all other reviewed systems are negative.   Constitutional:   N/A Cardiovascular:  N/A Ear/Nose/Throat:  N/A Skin: N/A Eyes: N/A Respiratory: N/A Gastroitestinal: N/A    Hematology/Lymphatic:  N/A Endocrine:  N/A Musculoskeletal: joints pain. Allergy/Immunology: N/A Neurological: gait difficulty Psychiatric:    N/A  Physical Exam  General: Well groomed and well nourished Neck: supple no carotid bruits Respiratory: LCA Cardiovascular: RRR  Neurologic Exam  Mental Status: pleasant, awake, alert, cooperative to history, talking, and casual conversation. Cranial Nerves: CN II-XII pupils were equal round reactive to light.  Fundi were sharp bilaterally.  Extraocular movements were full.  Visual fields were full on confrontational test.  Facial sensation and strength were normal.  Uvula tongue were midline.  Head turning and shoulder shrugging were normal and symmetric.  Tongue protrusion into the cheeks strength were normal.  Motor: Strength normal and symmetric upper and lower extremities  mild limitation on left lower extremity due to left knee pain. Sensory: length dependent decreased light touch, pinprick to toes, normal  proprioception, and  decreased vibratory sensation to toes. Coordination: Normal finger-to-nose, heel-to-shin.  There  was no dysmetria noticed. Gait and Station: wide based and stiff, atalgic due to left knee pain Reflexes: Deep tendon reflexes: Biceps: 2/2, Brachioradialis: 2/2, Triceps: 2/2, Pateller: 2/2, Achilles: 1/1.  Plantar responses are flexor.   Assessment and Plan:  47 y/o Caucasian female with RRMS, JV-virus Ab negative, on Tysarbri infusion since Feb 2013.next infusion 05/09/12    1.Continue  methylphenidate at 10mg  three times daily  2.repeat MRI of the brain w/wo, JC-virus antibody 3. refer her to orthopedic surgeon for evaluation of her left knee pain, 4 return to clinic with Kaylee Bryant in 6 months.

## 2012-09-08 ENCOUNTER — Telehealth: Payer: Self-pay

## 2012-09-08 NOTE — Telephone Encounter (Signed)
[name] from [place] called, left message.  Kaylee Bryant has been approved for 6 months, effective 09/04/2012-03/06/2013 Ref # [id number].  If there are any questions, he can be reached at [phone].

## 2012-09-29 ENCOUNTER — Encounter (HOSPITAL_COMMUNITY): Payer: Managed Care, Other (non HMO)

## 2012-09-30 ENCOUNTER — Encounter (HOSPITAL_COMMUNITY): Payer: Managed Care, Other (non HMO)

## 2012-09-30 ENCOUNTER — Telehealth: Payer: Self-pay | Admitting: Neurology

## 2012-09-30 ENCOUNTER — Other Ambulatory Visit: Payer: Self-pay | Admitting: Neurology

## 2012-09-30 DIAGNOSIS — G35 Multiple sclerosis: Secondary | ICD-10-CM

## 2012-09-30 NOTE — Telephone Encounter (Signed)
[facility] called and they need orders for Tysabri in Epic, patient appointment on Thursday

## 2012-10-03 ENCOUNTER — Encounter (HOSPITAL_COMMUNITY): Payer: Self-pay

## 2012-10-03 ENCOUNTER — Ambulatory Visit: Payer: Self-pay | Admitting: Neurology

## 2012-10-03 ENCOUNTER — Encounter (HOSPITAL_COMMUNITY)
Admission: RE | Admit: 2012-10-03 | Discharge: 2012-10-03 | Disposition: A | Discharge status: Home / Self Care | Payer: Managed Care, Other (non HMO) | Source: Ambulatory Visit | Attending: Neurology | Admitting: Neurology

## 2012-10-03 VITALS — BP 114/65 | HR 59 | Temp 98.4°F | Resp 20 | Ht 63.0 in | Wt 225.0 lb

## 2012-10-03 DIAGNOSIS — G35 Multiple sclerosis: Secondary | ICD-10-CM

## 2012-10-03 MED ORDER — SODIUM CHLORIDE 0.9 % IV SOLN
300.0000 mg | INTRAVENOUS | Status: DC
  Administered 2012-10-03: 300 mg via INTRAVENOUS
  Filled 2012-10-03: qty 15

## 2012-10-03 MED ORDER — LORATADINE 10 MG PO TABS
10.0000 mg | ORAL_TABLET | ORAL | Status: DC
  Administered 2012-10-03: 10 mg via ORAL
  Filled 2012-10-03: qty 1

## 2012-10-03 MED ORDER — SODIUM CHLORIDE 0.9 % IV SOLN
INTRAVENOUS | Status: DC
  Administered 2012-10-03: 20 mL/h via INTRAVENOUS

## 2012-10-03 MED ORDER — ACETAMINOPHEN 500 MG PO TABS
1000.0000 mg | ORAL_TABLET | ORAL | Status: DC
  Administered 2012-10-03: 1000 mg via ORAL
  Filled 2012-10-03: qty 2

## 2012-10-03 NOTE — Progress Notes (Signed)
Informed pt of the recommended time to stay post infusion of 1 hour and pt declines and states I will go when it is done.  Pt will call MD with any problems.

## 2012-10-08 ENCOUNTER — Other Ambulatory Visit: Payer: Self-pay

## 2012-10-08 ENCOUNTER — Telehealth: Payer: Self-pay | Admitting: Neurology

## 2012-10-08 MED ORDER — METHYLPHENIDATE HCL 10 MG PO TABS: 10.0000 mg | ORAL_TABLET | Freq: Three times a day (TID) | ORAL | Status: DC

## 2012-10-08 NOTE — Telephone Encounter (Signed)
Patient called requesting a refill on Ritalin.

## 2012-10-09 DIAGNOSIS — G35 Multiple sclerosis: Secondary | ICD-10-CM

## 2012-10-09 NOTE — Telephone Encounter (Signed)
Saw the orthopedic doctor yesterday and he wants to give her an injection in her knee. Wants to make sure this is okay to do with her current medications. Which is methylphenidate at 10mg  three times daily please advise.

## 2012-10-09 NOTE — Telephone Encounter (Signed)
Please call patient, it is Ok to proceed with her orthopedic treatment

## 2012-10-10 NOTE — Telephone Encounter (Signed)
I called and LMVM for pt that per Dr. Terrace Arabia , ok to proceed with orthopedic injection.  Pt to call back if needed.

## 2012-10-27 ENCOUNTER — Encounter (HOSPITAL_COMMUNITY): Payer: Managed Care, Other (non HMO)

## 2012-10-28 ENCOUNTER — Encounter (HOSPITAL_COMMUNITY): Payer: Managed Care, Other (non HMO)

## 2012-10-31 ENCOUNTER — Encounter (HOSPITAL_COMMUNITY): Payer: Self-pay

## 2012-10-31 ENCOUNTER — Encounter (HOSPITAL_COMMUNITY)
Admission: RE | Admit: 2012-10-31 | Discharge: 2012-10-31 | Disposition: A | Discharge status: Home / Self Care | Payer: Managed Care, Other (non HMO) | Source: Ambulatory Visit | Attending: Neurology | Admitting: Neurology

## 2012-10-31 VITALS — BP 142/78 | HR 55 | Temp 97.9°F | Resp 18

## 2012-10-31 DIAGNOSIS — G35 Multiple sclerosis: Secondary | ICD-10-CM | POA: Insufficient documentation

## 2012-10-31 MED ORDER — LORATADINE 10 MG PO TABS
10.0000 mg | ORAL_TABLET | ORAL | Status: DC
  Administered 2012-10-31: 10 mg via ORAL
  Filled 2012-10-31: qty 1

## 2012-10-31 MED ORDER — ACETAMINOPHEN 500 MG PO TABS
1000.0000 mg | ORAL_TABLET | ORAL | Status: DC
  Administered 2012-10-31: 1000 mg via ORAL
  Filled 2012-10-31: qty 2

## 2012-10-31 MED ORDER — SODIUM CHLORIDE 0.9 % IV SOLN
INTRAVENOUS | Status: DC
  Administered 2012-10-31: 250 mL via INTRAVENOUS

## 2012-10-31 MED ORDER — SODIUM CHLORIDE 0.9 % IV SOLN
300.0000 mg | INTRAVENOUS | Status: DC
  Administered 2012-10-31: 300 mg via INTRAVENOUS
  Filled 2012-10-31: qty 15

## 2012-10-31 NOTE — Progress Notes (Signed)
Advised pt to stay the 1 hour recommended time post tysabri infusion and pt states: "I'm gonna go ahead and leave".  Pt states she is aware to call MD for problems.

## 2012-11-03 DIAGNOSIS — Z0289 Encounter for other administrative examinations: Secondary | ICD-10-CM

## 2012-11-06 ENCOUNTER — Telehealth: Payer: Self-pay

## 2012-11-06 NOTE — Telephone Encounter (Signed)
I called patient and left a VM that FMLA forms are awaiting signature and then will be finished up today.

## 2012-11-18 ENCOUNTER — Other Ambulatory Visit: Payer: Self-pay | Admitting: Neurology

## 2012-11-28 ENCOUNTER — Encounter (HOSPITAL_COMMUNITY): Payer: Self-pay

## 2012-11-28 ENCOUNTER — Encounter (HOSPITAL_COMMUNITY)
Admission: RE | Admit: 2012-11-28 | Discharge: 2012-11-28 | Disposition: A | Discharge status: Home / Self Care | Payer: Managed Care, Other (non HMO) | Source: Ambulatory Visit | Attending: Neurology | Admitting: Neurology

## 2012-11-28 VITALS — BP 109/71 | HR 56 | Temp 97.2°F | Resp 18

## 2012-11-28 DIAGNOSIS — G35 Multiple sclerosis: Secondary | ICD-10-CM

## 2012-11-28 MED ORDER — SODIUM CHLORIDE 0.9 % IV SOLN
INTRAVENOUS | Status: AC
  Administered 2012-11-28: 09:00:00 via INTRAVENOUS

## 2012-11-28 MED ORDER — LORATADINE 10 MG PO TABS
10.0000 mg | ORAL_TABLET | ORAL | Status: AC
  Administered 2012-11-28: 10 mg via ORAL
  Filled 2012-11-28: qty 1

## 2012-11-28 MED ORDER — ACETAMINOPHEN 500 MG PO TABS
1000.0000 mg | ORAL_TABLET | ORAL | Status: AC
  Administered 2012-11-28: 1000 mg via ORAL
  Filled 2012-11-28: qty 2

## 2012-11-28 MED ORDER — SODIUM CHLORIDE 0.9 % IV SOLN
300.0000 mg | INTRAVENOUS | Status: AC
  Administered 2012-11-28: 300 mg via INTRAVENOUS
  Filled 2012-11-28: qty 15

## 2012-11-28 NOTE — Progress Notes (Signed)
Pt advised to stay the 1 hour recommended time post Tysabri infusion.  Pt states she is going to leave when done.  Pt knows to call MD for questions/concerns.

## 2012-12-24 ENCOUNTER — Other Ambulatory Visit: Payer: Self-pay | Admitting: Neurology

## 2012-12-24 ENCOUNTER — Ambulatory Visit (INDEPENDENT_AMBULATORY_CARE_PROVIDER_SITE_OTHER): Payer: Managed Care, Other (non HMO)

## 2012-12-24 DIAGNOSIS — G35 Multiple sclerosis: Secondary | ICD-10-CM

## 2012-12-24 DIAGNOSIS — M25562 Pain in left knee: Secondary | ICD-10-CM

## 2012-12-24 DIAGNOSIS — R269 Unspecified abnormalities of gait and mobility: Secondary | ICD-10-CM

## 2012-12-24 DIAGNOSIS — M25569 Pain in unspecified knee: Secondary | ICD-10-CM

## 2012-12-24 MED ORDER — METHYLPHENIDATE HCL 10 MG PO TABS: 10.0000 mg | ORAL_TABLET | Freq: Three times a day (TID) | ORAL | Status: DC

## 2012-12-24 MED ORDER — GADOPENTETATE DIMEGLUMINE 469.01 MG/ML IV SOLN: 20.0000 mL | Freq: Once | INTRAVENOUS | Status: AC | PRN

## 2012-12-26 ENCOUNTER — Encounter (HOSPITAL_COMMUNITY): Payer: Self-pay

## 2012-12-26 ENCOUNTER — Ambulatory Visit (HOSPITAL_COMMUNITY)
Admission: RE | Admit: 2012-12-26 | Discharge: 2012-12-26 | Disposition: A | Discharge status: Home / Self Care | Payer: Managed Care, Other (non HMO) | Source: Ambulatory Visit | Attending: Neurology | Admitting: Neurology

## 2012-12-26 VITALS — BP 117/59 | HR 61 | Temp 97.3°F | Resp 16

## 2012-12-26 DIAGNOSIS — G35 Multiple sclerosis: Secondary | ICD-10-CM | POA: Insufficient documentation

## 2012-12-26 MED ORDER — LORATADINE 10 MG PO TABS
10.0000 mg | ORAL_TABLET | ORAL | Status: AC
  Administered 2012-12-26: 10 mg via ORAL
  Filled 2012-12-26: qty 1

## 2012-12-26 MED ORDER — ACETAMINOPHEN 500 MG PO TABS
1000.0000 mg | ORAL_TABLET | ORAL | Status: AC
  Administered 2012-12-26: 1000 mg via ORAL
  Filled 2012-12-26: qty 2

## 2012-12-26 MED ORDER — SODIUM CHLORIDE 0.9 % IV SOLN
INTRAVENOUS | Status: AC
  Administered 2012-12-26: 10:00:00 via INTRAVENOUS

## 2012-12-26 MED ORDER — SODIUM CHLORIDE 0.9 % IV SOLN
300.0000 mg | INTRAVENOUS | Status: AC
  Administered 2012-12-26: 300 mg via INTRAVENOUS
  Filled 2012-12-26: qty 15

## 2012-12-26 NOTE — Progress Notes (Signed)
Quick Note:  Please call her,  MRI brain (with and without) demonstrating: 1. Multiple periventricular and subcortical chronic demyelinating plaques. 2. No acute plaques. 3. Mucosal thickening in the left maxillary sinus with bubbly secretions. 4. Compared to prior MRI on 01/09/12, no significant change.  ______

## 2013-01-12 NOTE — Progress Notes (Signed)
Quick Note:  Left message for patient that compared to MRI from 01-09-12, no significant changes. Told to call if she wants further details. ______

## 2013-01-21 ENCOUNTER — Telehealth: Payer: Self-pay | Admitting: Neurology

## 2013-01-21 ENCOUNTER — Other Ambulatory Visit: Payer: Self-pay | Admitting: Neurology

## 2013-01-21 DIAGNOSIS — G35 Multiple sclerosis: Secondary | ICD-10-CM

## 2013-01-21 NOTE — Telephone Encounter (Signed)
Dee from [place] called and needs order put in Epic for Tysabri, patient's appointment is tomorrow.

## 2013-01-23 ENCOUNTER — Ambulatory Visit (INDEPENDENT_AMBULATORY_CARE_PROVIDER_SITE_OTHER): Payer: Managed Care, Other (non HMO) | Admitting: Nurse Practitioner

## 2013-01-23 ENCOUNTER — Ambulatory Visit (HOSPITAL_COMMUNITY)
Admission: RE | Admit: 2013-01-23 | Discharge: 2013-01-23 | Disposition: A | Discharge status: Home / Self Care | Payer: Managed Care, Other (non HMO) | Source: Ambulatory Visit | Attending: Neurology | Admitting: Neurology

## 2013-01-23 ENCOUNTER — Encounter: Payer: Self-pay | Admitting: Nurse Practitioner

## 2013-01-23 ENCOUNTER — Encounter (HOSPITAL_COMMUNITY): Payer: Self-pay

## 2013-01-23 VITALS — BP 130/84 | HR 68 | Resp 16 | Ht 63.0 in | Wt 229.0 lb

## 2013-01-23 VITALS — BP 122/74 | HR 59 | Temp 97.0°F | Resp 16 | Ht 63.0 in | Wt 228.5 lb

## 2013-01-23 DIAGNOSIS — D229 Melanocytic nevi, unspecified: Secondary | ICD-10-CM

## 2013-01-23 DIAGNOSIS — Z0000 Encounter for general adult medical examination without abnormal findings: Secondary | ICD-10-CM

## 2013-01-23 DIAGNOSIS — G35 Multiple sclerosis: Secondary | ICD-10-CM

## 2013-01-23 DIAGNOSIS — D239 Other benign neoplasm of skin, unspecified: Secondary | ICD-10-CM

## 2013-01-23 DIAGNOSIS — Z23 Encounter for immunization: Secondary | ICD-10-CM

## 2013-01-23 DIAGNOSIS — IMO0002 Reserved for concepts with insufficient information to code with codable children: Secondary | ICD-10-CM

## 2013-01-23 DIAGNOSIS — R6889 Other general symptoms and signs: Secondary | ICD-10-CM

## 2013-01-23 DIAGNOSIS — N915 Oligomenorrhea, unspecified: Secondary | ICD-10-CM

## 2013-01-23 DIAGNOSIS — Z01419 Encounter for gynecological examination (general) (routine) without abnormal findings: Secondary | ICD-10-CM

## 2013-01-23 LAB — POCT URINALYSIS DIPSTICK
Bilirubin, UA: NEGATIVE
Blood, UA: NEGATIVE
Glucose, UA: NEGATIVE
Ketones, UA: NEGATIVE
Leukocytes, UA: NEGATIVE
Nitrite, UA: NEGATIVE
Protein, UA: NEGATIVE
Urobilinogen, UA: NEGATIVE
pH, UA: 7

## 2013-01-23 MED ORDER — MEDROXYPROGESTERONE ACETATE 10 MG PO TABS: 10.0000 mg | ORAL_TABLET | Freq: Every day | ORAL | Status: DC

## 2013-01-23 MED ORDER — LORATADINE 10 MG PO TABS
10.0000 mg | ORAL_TABLET | ORAL | Status: DC
  Administered 2013-01-23: 10 mg via ORAL
  Filled 2013-01-23: qty 1

## 2013-01-23 MED ORDER — SODIUM CHLORIDE 0.9 % IV SOLN
300.0000 mg | INTRAVENOUS | Status: DC
  Administered 2013-01-23: 300 mg via INTRAVENOUS
  Filled 2013-01-23: qty 15

## 2013-01-23 MED ORDER — ACETAMINOPHEN 500 MG PO TABS
1000.0000 mg | ORAL_TABLET | ORAL | Status: DC
  Administered 2013-01-23: 1000 mg via ORAL
  Filled 2013-01-23: qty 2

## 2013-01-23 MED ORDER — SODIUM CHLORIDE 0.9 % IV SOLN
INTRAVENOUS | Status: DC
  Administered 2013-01-23: 09:00:00 via INTRAVENOUS

## 2013-01-23 NOTE — Patient Instructions (Signed)

## 2013-01-23 NOTE — Progress Notes (Signed)
Patient ID: Kaylee Bryant, female   DOB: 1965-11-29, 47 y.o.   MRN: [id number] 47 y.o. [id number] Married Caucasian Fe here for annual exam.  Normal cycles last 7-9 days and fairly heavy that she took OCP for years.  Ever since starting Tysabri IV for MS her menses has only been every 6 months for about 18 months. Now cycle last 4 days, with being very heavy X 1 day. Super pad changing every 2 hours.  Husband with vasectomy.  She has felt PMS, and bloating like menses was going to start but never does. She does have a mole that she states has been present forever but now feels different and larger at left buttock area. She works as a Engineer, civil (consulting) in wound care.   Patient's last menstrual period was 06/24/2012.          Sexually active: yes  The current method of family planning is vasectomy.    Exercising: no  The patient does not participate in regular exercise at present. Smoker:  Former, quit in 2005  Health Maintenance: Pap:  06/2011, normal, history of abnormal and HPV 2004 MMG:  06/2011, normal TDaP:  ? Overdue, will update today Labs: labs at PCP.  Urine: normal   reports that she quit smoking about 9 years ago. Her smoking use included Cigarettes. She smoked 1.50 packs per day. She has never used smokeless tobacco. She reports that she drinks about 0.6 ounces of alcohol per week. She reports that she does not use illicit drugs.  Past Medical History  Diagnosis Date  . Multiple sclerosis   . Fatigue   . Abnormal Pap smear 2004    HPV, cone biopsy    Past Surgical History  Procedure Laterality Date  . Cholecystectomy      1988  . Cesarean section      1996, 2006  . Colposcopy  2004  . Cervical cone biopsy  2004    Current Outpatient Prescriptions  Medication Sig Dispense Refill  . aspirin 325 MG tablet Take 325 mg by mouth every 4 (four) hours as needed.       . baclofen (LIORESAL) 10 MG tablet TAKE ONE TABLET BY MOUTH THREE TIMES DAILY  90 tablet  11  . gabapentin (NEURONTIN) 300 MG  capsule TAKE TWO CAPSULES BY MOUTH AT BEDTIME  60 capsule  6  . ibuprofen (ADVIL,MOTRIN) 200 MG tablet Take 800 mg by mouth at bedtime as needed.      . indomethacin (INDOCIN) 25 MG capsule Take 50 mg by mouth 2 (two) times daily with a meal.      . methylphenidate (RITALIN) 10 MG tablet Take 1 tablet (10 mg total) by mouth 3 (three) times daily. Do not take last dose any later than 4pm  90 tablet  0  . natalizumab (TYSABRI) 300 MG/15ML injection Inject into the vein.      . sodium chloride 0.9 % SOLN 100 mL with natalizumab 300 MG/15ML CONC 300 mg Inject 300 mg into the vein every 28 (twenty-eight) days.  300 mg  3   No current facility-administered medications for this visit.   Facility-Administered Medications Ordered in Other Visits  Medication Dose Route Frequency Provider Last Rate Last Dose  . 0.9 %  sodium chloride infusion   Intravenous Q28 days Levert Feinstein, MD 50 mL/hr at 01/23/13 0912    . acetaminophen (TYLENOL) tablet 1,000 mg  1,000 mg Oral Q28 days Levert Feinstein, MD   1,000 mg at 01/23/13 0914  .  loratadine (CLARITIN) tablet 10 mg  10 mg Oral Q28 days Levert Feinstein, MD   10 mg at 01/23/13 0914  . natalizumab (TYSABRI) 300 mg in sodium chloride 0.9 % 100 mL IVPB  300 mg Intravenous Q28 days Levert Feinstein, MD   300 mg at 01/23/13 0919    Family History  Problem Relation Age of Onset  . Diabetes Mother   . Hypertension Mother   . Hypertension Father   . Diabetes Sister   . Breast cancer Maternal Aunt     ROS:  Pertinent items are noted in HPI.  Otherwise, a comprehensive ROS was negative.  Exam:   BP 130/84  Pulse 68  Resp 16  Ht 5\' 3"  (1.6 m)  Wt 229 lb (103.874 kg)  BMI 40.58 kg/m2  LMP 06/24/2012 Height: 5\' 3"  (160 cm)  Ht Readings from Last 3 Encounters:  01/23/13 5\' 3"  (1.6 m)  01/23/13 5\' 3"  (1.6 m)  10/03/12 5\' 3"  (1.6 m)    General appearance: alert, cooperative and appears stated age Head: Normocephalic, without obvious abnormality, atraumatic Neck: no adenopathy,  supple, symmetrical, trachea midline and thyroid normal to inspection and palpation Lungs: clear to auscultation bilaterally Breasts: normal appearance, no masses or tenderness Heart: regular rate and rhythm Abdomen: soft, non-tender; no masses,  no organomegaly Extremities: extremities normal, atraumatic, no cyanosis or edema Skin: Skin color, texture, turgor normal. No rashes or lesions Lymph nodes: Cervical, supraclavicular, and axillary nodes normal. No abnormal inguinal nodes palpated Neurologic: Grossly normal   Pelvic: External genitalia:  no lesions.  Mole on left inner perianal buttock area - irregular in shape and per patient has gotten larger.              Urethra:  normal appearing urethra with no masses, tenderness or lesions              Bartholin's and Skene's: normal                 Vagina: normal appearing vagina with normal color and discharge, no lesions              Cervix: anteverted              Pap taken: yes Bimanual Exam:  Uterus:  normal size, contour, position, consistency, mobility, non-tender              Adnexa: no mass, fullness, tenderness               Rectovaginal: Confirms               Anus:  normal sphincter tone, no lesions  A:  Well Woman with normal exam  History of menorrhagia  History of MS - currently on Tysabri for 18 months  Husband with vasectomy  Currently oligomenorrhea - at risk for endo hyperplasia  Remote history of abnormal pap in 1994 treated with cervical conization.  Mole change left buttock area   Update immunization today  P:   Pap smear as per guidelines Done today with HR HPV  Mammogram will schedule and list of facilities given to patient.  Discussed her care with Dr. Hyacinth Meeker - plan is to do a Provera challenge now - if her withdrawal is still moderate to light will plan to do Provera every 3  months.  If her withdrawal is significant she will call back, then may get further evaluation.  Patient understands and  agrees.  Referral to dermatologist for mole check  Counseled on  breast self exam, adequate intake of calcium and vitamin D, diet and exercise  TDaP given today  return annually or prn  An After Visit Summary was printed and given to the patient.

## 2013-01-25 DIAGNOSIS — D229 Melanocytic nevi, unspecified: Secondary | ICD-10-CM | POA: Insufficient documentation

## 2013-01-25 DIAGNOSIS — N915 Oligomenorrhea, unspecified: Secondary | ICD-10-CM | POA: Insufficient documentation

## 2013-01-25 DIAGNOSIS — IMO0002 Reserved for concepts with insufficient information to code with codable children: Secondary | ICD-10-CM | POA: Insufficient documentation

## 2013-01-26 NOTE — Progress Notes (Signed)
Reviewed personally.  M. Suzanne Miller, MD.  

## 2013-01-27 LAB — IPS PAP TEST WITH HPV

## 2013-01-28 ENCOUNTER — Telehealth: Payer: Self-pay | Admitting: *Deleted

## 2013-01-28 NOTE — Telephone Encounter (Signed)
I have attempted to contact this patient by phone with the following results: left message to return my call on answering machine (home).  

## 2013-01-28 NOTE — Telephone Encounter (Signed)
Message copied by Luisa Dago on Wed Jan 28, 2013 10:13 AM ------      Message from: Ria Comment R      Created: Tue Jan 27, 2013  6:22 PM       Let her know result of pap - good news about HPV  (previous HPV in 2004) ------

## 2013-01-29 NOTE — Telephone Encounter (Signed)
Returning call.

## 2013-01-29 NOTE — Telephone Encounter (Signed)
Notified of results in result note. 

## 2013-02-20 ENCOUNTER — Encounter (HOSPITAL_COMMUNITY): Payer: Self-pay

## 2013-02-20 ENCOUNTER — Ambulatory Visit (HOSPITAL_COMMUNITY)
Admission: RE | Admit: 2013-02-20 | Discharge: 2013-02-20 | Disposition: A | Discharge status: Home / Self Care | Payer: Managed Care, Other (non HMO) | Source: Ambulatory Visit | Attending: Neurology | Admitting: Neurology

## 2013-02-20 VITALS — BP 132/66 | HR 73 | Temp 97.6°F | Resp 18 | Ht 63.0 in | Wt 228.0 lb

## 2013-02-20 DIAGNOSIS — G35 Multiple sclerosis: Secondary | ICD-10-CM | POA: Insufficient documentation

## 2013-02-20 MED ORDER — LORATADINE 10 MG PO TABS
10.0000 mg | ORAL_TABLET | ORAL | Status: DC
  Administered 2013-02-20: 10 mg via ORAL
  Filled 2013-02-20: qty 1

## 2013-02-20 MED ORDER — SODIUM CHLORIDE 0.9 % IV SOLN
INTRAVENOUS | Status: DC
  Administered 2013-02-20: 10:00:00 via INTRAVENOUS

## 2013-02-20 MED ORDER — SODIUM CHLORIDE 0.9 % IV SOLN
300.0000 mg | INTRAVENOUS | Status: DC
  Administered 2013-02-20: 300 mg via INTRAVENOUS
  Filled 2013-02-20: qty 15

## 2013-02-20 MED ORDER — ACETAMINOPHEN 500 MG PO TABS
1000.0000 mg | ORAL_TABLET | ORAL | Status: DC
  Administered 2013-02-20: 1000 mg via ORAL
  Filled 2013-02-20: qty 2

## 2013-02-20 NOTE — Progress Notes (Signed)
Pt advised to stay the 1 hour post infusion.  Pt states she will go ahead and leave.  Pt advised to call physician with questions/concerns.  Pt voiced understanding.

## 2013-03-23 ENCOUNTER — Ambulatory Visit (HOSPITAL_COMMUNITY): Payer: Managed Care, Other (non HMO)

## 2013-03-25 ENCOUNTER — Encounter (HOSPITAL_COMMUNITY): Payer: Self-pay

## 2013-03-25 ENCOUNTER — Encounter (HOSPITAL_COMMUNITY)
Admission: RE | Admit: 2013-03-25 | Discharge: 2013-03-25 | Disposition: A | Discharge status: Home / Self Care | Payer: Managed Care, Other (non HMO) | Source: Ambulatory Visit | Attending: Neurology | Admitting: Neurology

## 2013-03-25 VITALS — BP 135/79 | HR 67 | Temp 97.3°F | Resp 16

## 2013-03-25 DIAGNOSIS — G35 Multiple sclerosis: Secondary | ICD-10-CM | POA: Insufficient documentation

## 2013-03-25 MED ORDER — ACETAMINOPHEN 500 MG PO TABS
1000.0000 mg | ORAL_TABLET | ORAL | Status: AC
  Administered 2013-03-25: 1000 mg via ORAL
  Filled 2013-03-25: qty 2

## 2013-03-25 MED ORDER — LORATADINE 10 MG PO TABS
10.0000 mg | ORAL_TABLET | ORAL | Status: AC
  Administered 2013-03-25: 10 mg via ORAL
  Filled 2013-03-25: qty 1

## 2013-03-25 MED ORDER — SODIUM CHLORIDE 0.9 % IV SOLN
300.0000 mg | INTRAVENOUS | Status: AC
  Administered 2013-03-25: 300 mg via INTRAVENOUS
  Filled 2013-03-25: qty 15

## 2013-03-25 MED ORDER — SODIUM CHLORIDE 0.9 % IV SOLN
INTRAVENOUS | Status: AC
  Administered 2013-03-25: 09:00:00 via INTRAVENOUS

## 2013-03-25 NOTE — Progress Notes (Signed)
Patient declined to stay for one hour observation post Tysabri. Home with husband. Aware to call MD if any problems or questions.

## 2013-04-02 NOTE — Telephone Encounter (Signed)
Completed.

## 2013-04-21 ENCOUNTER — Telehealth: Payer: Self-pay | Admitting: *Deleted

## 2013-04-21 ENCOUNTER — Other Ambulatory Visit: Payer: Self-pay | Admitting: Neurology

## 2013-04-21 DIAGNOSIS — G35 Multiple sclerosis: Secondary | ICD-10-CM

## 2013-04-21 NOTE — Telephone Encounter (Signed)
Dee from [facility] calling for tysabri orders.  Pt is due there on Thursday.

## 2013-04-21 NOTE — Telephone Encounter (Signed)
I have put orders in for her Tysarbri infusion

## 2013-04-22 NOTE — Telephone Encounter (Signed)
See attached note from Dr. Krista Blue

## 2013-04-23 NOTE — Telephone Encounter (Signed)
Patient scheduled for follow up on 05-04-13.

## 2013-04-24 ENCOUNTER — Encounter (HOSPITAL_COMMUNITY)
Admission: RE | Admit: 2013-04-24 | Discharge: 2013-04-24 | Disposition: A | Discharge status: Home / Self Care | Payer: Managed Care, Other (non HMO) | Source: Ambulatory Visit | Attending: Neurology | Admitting: Neurology

## 2013-04-24 ENCOUNTER — Encounter (HOSPITAL_COMMUNITY): Payer: Self-pay

## 2013-04-24 VITALS — BP 123/71 | HR 65 | Temp 98.1°F | Resp 16 | Ht 63.0 in | Wt 228.0 lb

## 2013-04-24 DIAGNOSIS — G35 Multiple sclerosis: Secondary | ICD-10-CM | POA: Insufficient documentation

## 2013-04-24 MED ORDER — SODIUM CHLORIDE 0.9 % IV SOLN
300.0000 mg | INTRAVENOUS | Status: DC
  Administered 2013-04-24: 300 mg via INTRAVENOUS
  Filled 2013-04-24: qty 15

## 2013-04-24 MED ORDER — SODIUM CHLORIDE 0.9 % IV SOLN
INTRAVENOUS | Status: DC
Start: 2013-04-24 — End: 2013-04-25
  Administered 2013-04-24: 11:00:00 via INTRAVENOUS

## 2013-04-24 MED ORDER — ACETAMINOPHEN 500 MG PO TABS
1000.0000 mg | ORAL_TABLET | ORAL | Status: DC
Start: 2013-04-24 — End: 2013-04-25
  Administered 2013-04-24: 1000 mg via ORAL
  Filled 2013-04-24: qty 2

## 2013-04-24 MED ORDER — LORATADINE 10 MG PO TABS
10.0000 mg | ORAL_TABLET | ORAL | Status: DC
  Administered 2013-04-24: 10 mg via ORAL
  Filled 2013-04-24: qty 1

## 2013-04-24 NOTE — Discharge Instructions (Signed)
Natalizumab injection °What is this medicine? °NATALIZUMAB (na ta LIZ you mab) is used to treat relapsing multiple sclerosis. This drug is not a cure. It is also used to treat Crohn's disease. °This medicine may be used for other purposes; ask your health care provider or pharmacist if you have questions. °COMMON BRAND NAME(S): Tysabri °What should I tell my health care provider before I take this medicine? °They need to know if you have any of these conditions: °-immune system problems °-progressive multifocal leukoencephalopathy (PML) °-an unusual or allergic reaction to natalizumab, other medicines, foods, dyes, or preservatives °-pregnant or trying to get pregnant °-breast-feeding °How should I use this medicine? °This medicine is for infusion into a vein. It is given by a health care professional in a hospital or clinic setting. °A special MedGuide will be given to you by the pharmacist with each prescription and refill. Be sure to read this information carefully each time. °Talk to your pediatrician regarding the use of this medicine in children. This medicine is not approved for use in children. °Overdosage: If you think you have taken too much of this medicine contact a poison control center or emergency room at once. °NOTE: This medicine is only for you. Do not share this medicine with others. °What if I miss a dose? °It is important not to miss your dose. Call your doctor or health care professional if you are unable to keep an appointment. °What may interact with this medicine? °-azathioprine °-cyclosporine °-interferon °-6-mercaptopurine °-methotrexate °-steroid medicines like prednisone or cortisone °-TNF-alpha inhibitors like adalimumab, etanercept, and infliximab °-vaccines °This list may not describe all possible interactions. Give your health care provider a list of all the medicines, herbs, non-prescription drugs, or dietary supplements you use. Also tell them if you smoke, drink alcohol, or use  illegal drugs. Some items may interact with your medicine. °What should I watch for while using this medicine? °Your condition will be monitored carefully while you are receiving this medicine. Visit your doctor for regular check ups. Tell your doctor or healthcare professional if your symptoms do not start to get better or if they get worse. °Stay away from people who are sick. Call your doctor or health care professional for advice if you get a fever, chills or sore throat, or other symptoms of a cold or flu. Do not treat yourself. °In some patients, this medicine may cause a serious brain infection that may cause death. If you have any problems seeing, thinking, speaking, walking, or standing, tell your doctor right away. If you cannot reach your doctor, get urgent medical care. °What side effects may I notice from receiving this medicine? °Side effects that you should report to your doctor or health care professional as soon as possible: °-allergic reactions like skin rash, itching or hives, swelling of the face, lips, or tongue °-breathing problems °-changes in vision °-chest pain °-dark urine °-depression, feelings of sadness °-dizziness °-general ill feeling or flu-like symptoms °-irregular, missed, or painful menstrual periods °-light-colored stools °-loss of appetite, nausea °-muscle weakness °-problems with balance, talking, or walking °-right upper belly pain °-unusually weak or tired °-yellowing of the eyes or skin °Side effects that usually do not require medical attention (report to your doctor or health care professional if they continue or are bothersome): °-aches, pains °-headache °-stomach upset °-tiredness °This list may not describe all possible side effects. Call your doctor for medical advice about side effects. You may report side effects to FDA at 1-800-FDA-1088. °Where should I keep   my medicine? °This drug is given in a hospital or clinic and will not be stored at home. °NOTE: This sheet is  a summary. It may not cover all possible information. If you have questions about this medicine, talk to your doctor, pharmacist, or health care provider. °© 2014, Elsevier/Gold Standard. (2008-05-01 13:33:21) °Multiple Sclerosis °Multiple sclerosis (MS) is a disease of the central nervous system. It leads to loss of the insulating covering of the nerves (myelin sheath) of your brain. When this happens, brain signals do not get transmitted properly or may not get transmitted at all. The symptoms of MS occur in episodes or attacks. These attacks may last weeks to months. There may be long periods of nearly no problems between attacks. The age of onset of MS varies.  °CAUSES °The cause of MS is unknown. However, it is more common in the [place] than in the southern [place]. °RISK FACTORS °There is a higher incidence of MS in women than in men. MS is not an inherited illness, although your risk of MS is higher if you have a relative with MS. °SIGNS AND SYMPTOMS  °The symptoms of MS occur in episodes or attacks. These attacks may last weeks to months. There may be long periods of almost no symptoms between attacks. °The symptoms of MS vary. This is because of the many different ways it affects the central nervous system. The main symptoms of MS include: °· Vision problems and eye pain. °· Numbness. °· Weakness. °· Paralysis in your arms, hands, feet, and legs (extremities). °· Balance problems. °· Tremors. °DIAGNOSIS  °Your health care provider can diagnose MS with the help of imaging exams and lab tests. These may include specialized X-ray exams and spinal fluid tests. The best imaging exam to confirm a diagnosis of MS is MRI. °TREATMENT  °There is no known cure for MS, but there are medicines that can decrease the number and frequency of attacks. Steroids are often used for short-term relief. Physical and occupational therapy may also help. °HOME CARE INSTRUCTIONS  °· Take medicines as directed by  your health care provider. °· Exercise as directed by your health care provider. °SEEK MEDICAL CARE IF: °You begin to feel depressed. °SEEK IMMEDIATE MEDICAL CARE IF: °· You develop paralysis. °· You develop problems with bladder, bowel, or sexual function. °· You develop mental changes, such as forgetfulness or mood swings. °· You have a seizure. °Document Released: 03/09/2000 Document Revised: 12/31/2012 Document Reviewed: 11/17/2012 °ExitCare® Patient Information ©2014 [employer]. ° ° °

## 2013-04-24 NOTE — Progress Notes (Signed)
Pt stayed 30 minutes of the suggested 1 hour observation time post Tysabri. She states she will notify he Dr for any problems questions or concerns

## 2013-05-04 ENCOUNTER — Encounter: Payer: Self-pay | Admitting: Nurse Practitioner

## 2013-05-04 ENCOUNTER — Ambulatory Visit (INDEPENDENT_AMBULATORY_CARE_PROVIDER_SITE_OTHER): Payer: Managed Care, Other (non HMO) | Admitting: Neurology

## 2013-05-04 ENCOUNTER — Ambulatory Visit (INDEPENDENT_AMBULATORY_CARE_PROVIDER_SITE_OTHER): Payer: Self-pay | Admitting: *Deleted

## 2013-05-04 ENCOUNTER — Encounter: Payer: Self-pay | Admitting: Neurology

## 2013-05-04 VITALS — BP 122/80 | HR 72 | Ht 66.0 in | Wt 240.0 lb

## 2013-05-04 DIAGNOSIS — G35 Multiple sclerosis: Secondary | ICD-10-CM

## 2013-05-04 DIAGNOSIS — Z0289 Encounter for other administrative examinations: Secondary | ICD-10-CM

## 2013-05-04 DIAGNOSIS — G35D Multiple sclerosis, unspecified: Secondary | ICD-10-CM

## 2013-05-04 MED ORDER — METHYLPHENIDATE HCL 10 MG PO TABS: 10.0000 mg | ORAL_TABLET | Freq: Three times a day (TID) | ORAL | Status: DC

## 2013-05-04 NOTE — Progress Notes (Addendum)
History of Present Illness:   Mrs. Tome is a 71 RH WF for continued care of RRMS.   She was diagnosed with RRMS in 2007, 6 month post partum, she had numbness from waist done, diagnosis was confirmed by marked abnormal MRI, I reviewed the film from [facility] in March,2007. There were multiple T2/Flair periventricular lesions, corpus callosum involvement, mutiple ring enhancing lesions. Also had enhancing lesion in cervical and thoracic spines.  She was treated at [facility] by Dr. Dellis Filbert, no csf study, but had extensive lab evaluations to rule out mimics.  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  She denies visual changes or other clear relapsing episodes.   MRI was done by Dr. Brandon Melnick in 01/2007, I have report only,MRI  brain, multiple T2/FLAIR lesions in both hemisphere with corpus callosum involvement, there are slight increase in lesion load. MRI cervical, stable lesions in C6-7, and C2-3. Thoracic, multiple non enhancing lesions.  She tolerated Rebif well, no flu like illness, but  complains of bilateral arm, and the lateral thigh lipoatrophy with repeat Rebif injection. There was no clear relapsed over the years, but she has slow worsening gait difficulty over the past 6 months.  Most recent MRI in Sept 2012,  MRI of thoracic spine Multiple chronic plaques at C7, T6-7, T8-9, T10.  MRI of cervical spine ,Multiple chronic demyelinating plaques at C3, C4 and C7. MRI of brain Multiple round and ovioid, periventricular and subcortical chronic demyelinating plaques.  2 plaques noted in the pons.  No abnormal lesions are seen on post contrast views.    Her JC-virus antibody was negative. After discuss, we started her on Tysarbri infusion since February of 2013.  She  tolerated the infusion very well, but complains of excessive fatigue over the past few months, she still able to work full-time as a Marine scientist, she complains of difficulty dragging herself out of bed.  Modafinil was not approved by insurance.She was put on Ritalin 10mg  tid, which helped her fatigue. feels much better on Tysabri.    JC-virus antibody negative (July 2014), Negative 0.17 in 11/11/2013,     UPDATE Feb 9th 2015: She is doing very well, her gait has improved, tolerating Tysarbri, if lower dose of Ritalin 5mg  tid.   Review of Systems  Out of a complete 14 system review, the patient complains of only the following symptoms, and all other reviewed systems are negative.  Mild gait difficulty    Physical Exam  General: Well groomed and well nourished Neck: supple no carotid bruits Respiratory: LCA Cardiovascular: RRR  Neurologic Exam  Mental Status: pleasant, awake, alert, cooperative to history, talking, and casual conversation. Cranial Nerves: CN II-XII pupils were equal round reactive to light.  Fundi were sharp bilaterally.  Extraocular movements were full.  Visual fields were full on confrontational test.  Facial sensation and strength were normal.  Uvula tongue were midline.  Head turning and shoulder shrugging were normal and symmetric.  Tongue protrusion into the cheeks strength were normal.  Motor: Strength normal and symmetric upper and lower extremities mild limitation on left lower extremity due to left knee pain. Sensory: length dependent decreased light touch, pinprick to toes, normal  proprioception, and  decreased vibratory sensation to toes. Coordination: Normal finger-to-nose, heel-to-shin.  There was no  dysmetria noticed. Gait and Station: wide based and stiff, steady is able to perform tiptoe, and heel walking Reflexes: Deep tendon reflexes: Biceps: 2/2, Brachioradialis: 2/2, Triceps: 2/2, Pateller: 2/2, Achilles: 1/1.  Plantar responses are flexor.   Assessment and  Plan:  48 y/o Caucasian female with RRMS, JV-virus Ab negative last July 2014, on Tysarbri infusion since Feb 2013.  1.Continue  methylphenidate at 5 to 10mg  three times daily  2.repeat MRI of the brain w/wo, JC-virus antibody 3. continue Tysabri infusion 4 return to clinic in 6 months with Hoyle Sauer will repeat MRI of brain with and without contrast, JC virus every 6 months, .

## 2013-05-04 NOTE — Progress Notes (Signed)
Pt here for appt with Dr. Krista Blue.  Order for JCV test.  Under aseptic technique 23g butterfly needle inserted to R AC and blood drawn for send out test to [facility].  Specimen placed in [facility] for p/u. [id number].

## 2013-05-13 NOTE — Telephone Encounter (Signed)
error 

## 2013-05-18 ENCOUNTER — Inpatient Hospital Stay: Admission: RE | Admit: 2013-05-18 | Payer: Managed Care, Other (non HMO) | Source: Ambulatory Visit

## 2013-05-22 ENCOUNTER — Encounter (HOSPITAL_COMMUNITY)
Admission: RE | Admit: 2013-05-22 | Discharge: 2013-05-22 | Disposition: A | Discharge status: Home / Self Care | Payer: Managed Care, Other (non HMO) | Source: Ambulatory Visit | Attending: Neurology | Admitting: Neurology

## 2013-05-22 ENCOUNTER — Encounter (HOSPITAL_COMMUNITY): Payer: Self-pay

## 2013-05-22 ENCOUNTER — Ambulatory Visit
Admission: RE | Admit: 2013-05-22 | Discharge: 2013-05-22 | Disposition: A | Discharge status: Home / Self Care | Payer: 59 | Source: Ambulatory Visit | Attending: Neurology | Admitting: Neurology

## 2013-05-22 VITALS — BP 118/70 | HR 67 | Temp 98.0°F | Resp 16

## 2013-05-22 DIAGNOSIS — G35 Multiple sclerosis: Secondary | ICD-10-CM

## 2013-05-22 MED ORDER — SODIUM CHLORIDE 0.9 % IV SOLN
INTRAVENOUS | Status: DC
Start: 2013-05-22 — End: 2013-05-23
  Administered 2013-05-22: 11:00:00 via INTRAVENOUS

## 2013-05-22 MED ORDER — NATALIZUMAB 300 MG/15ML IV CONC
300.0000 mg | INTRAVENOUS | Status: DC
Start: 2013-05-22 — End: 2013-05-23
  Administered 2013-05-22: 300 mg via INTRAVENOUS
  Filled 2013-05-22: qty 15

## 2013-05-22 MED ORDER — LORATADINE 10 MG PO TABS
10.0000 mg | ORAL_TABLET | ORAL | Status: DC
  Administered 2013-05-22: 10 mg via ORAL
  Filled 2013-05-22: qty 1

## 2013-05-22 MED ORDER — ACETAMINOPHEN 500 MG PO TABS
1000.0000 mg | ORAL_TABLET | ORAL | Status: DC
  Administered 2013-05-22: 1000 mg via ORAL
  Filled 2013-05-22: qty 2

## 2013-05-22 MED ORDER — GADOBENATE DIMEGLUMINE 529 MG/ML IV SOLN
20.0000 mL | Freq: Once | INTRAVENOUS | Status: AC | PRN
  Administered 2013-05-22: 20 mL via INTRAVENOUS

## 2013-05-22 NOTE — Discharge Instructions (Signed)

## 2013-05-26 ENCOUNTER — Telehealth: Payer: Self-pay | Admitting: Neurology

## 2013-05-26 NOTE — Telephone Encounter (Signed)
Kaylee Bryant: Please call patient, I was able to review her MRI of the brain, compared to previous scan, there was no significant worsening, continued evidence of multiple sclerosis, continue current treatment

## 2013-05-28 NOTE — Telephone Encounter (Signed)
Left message with MRI brain results, per Dr. Krista Blue.  Told to call with further questions.

## 2013-06-01 ENCOUNTER — Telehealth: Payer: Self-pay | Admitting: Nurse Practitioner

## 2013-06-01 NOTE — Telephone Encounter (Signed)
Patient is having severe right side pain. Patient would like an appointment this week.

## 2013-06-01 NOTE — Telephone Encounter (Signed)
Spoke with pt who has been having pains in her side sporadically for several months. She has seen her PCP about it and he thought it could be colon spasms related to her MS. This month the pains have been worse, and feel more like cramping and ovary pain. Pt last took provera in mid February and did not have a period yet. Pt takes ibuprofen for her MS on a regular basis. Reports the pains come and go, and now they have increased so that she cannot lay on her right side. Pt denies fever or urinary symptoms. Offered pt OV today, but she is at work. Pt requested OV tomorrow afternoon with PG. Sched appt tomorrow at 3:30 with PG. Advised pt to go to Urgent Care or ER if pain becomes excruciating. Pt agreeable.

## 2013-06-02 ENCOUNTER — Encounter: Payer: Self-pay | Admitting: Nurse Practitioner

## 2013-06-02 ENCOUNTER — Ambulatory Visit (INDEPENDENT_AMBULATORY_CARE_PROVIDER_SITE_OTHER): Payer: Managed Care, Other (non HMO) | Admitting: Nurse Practitioner

## 2013-06-02 VITALS — BP 120/76 | HR 68 | Temp 97.8°F | Ht 63.0 in | Wt 240.0 lb

## 2013-06-02 DIAGNOSIS — Z9089 Acquired absence of other organs: Secondary | ICD-10-CM

## 2013-06-02 DIAGNOSIS — R109 Unspecified abdominal pain: Secondary | ICD-10-CM

## 2013-06-02 DIAGNOSIS — Z9049 Acquired absence of other specified parts of digestive tract: Secondary | ICD-10-CM

## 2013-06-02 DIAGNOSIS — K589 Irritable bowel syndrome without diarrhea: Secondary | ICD-10-CM

## 2013-06-02 DIAGNOSIS — G35 Multiple sclerosis: Secondary | ICD-10-CM

## 2013-06-02 DIAGNOSIS — N915 Oligomenorrhea, unspecified: Secondary | ICD-10-CM

## 2013-06-02 DIAGNOSIS — G35D Multiple sclerosis, unspecified: Secondary | ICD-10-CM

## 2013-06-02 LAB — CBC WITH DIFFERENTIAL/PLATELET
Basophils Absolute: 0.1 10*3/uL (ref 0.0–0.1)
Basophils Relative: 1 % (ref 0–1)
Eosinophils Absolute: 0.2 10*3/uL (ref 0.0–0.7)
Eosinophils Relative: 2 % (ref 0–5)
HCT: 38.8 % (ref 36.0–46.0)
Hemoglobin: 13.4 g/dL (ref 12.0–15.0)
Lymphocytes Relative: 49 % — ABNORMAL HIGH (ref 12–46)
Lymphs Abs: 4.9 10*3/uL — ABNORMAL HIGH (ref 0.7–4.0)
MCH: 29.6 pg (ref 26.0–34.0)
MCHC: 34.5 g/dL (ref 30.0–36.0)
MCV: 85.7 fL (ref 78.0–100.0)
Monocytes Absolute: 0.8 10*3/uL (ref 0.1–1.0)
Monocytes Relative: 8 % (ref 3–12)
Neutro Abs: 4 10*3/uL (ref 1.7–7.7)
Neutrophils Relative %: 40 % — ABNORMAL LOW (ref 43–77)
Platelets: 334 10*3/uL (ref 150–400)
RBC: 4.53 MIL/uL (ref 3.87–5.11)
RDW: 14.3 % (ref 11.5–15.5)
WBC: 10.1 10*3/uL (ref 4.0–10.5)

## 2013-06-02 LAB — POCT URINALYSIS DIPSTICK
Bilirubin, UA: NEGATIVE
Blood, UA: NEGATIVE
Glucose, UA: NEGATIVE
Ketones, UA: NEGATIVE
Leukocytes, UA: NEGATIVE
Nitrite, UA: NEGATIVE
Protein, UA: NEGATIVE
Urobilinogen, UA: NEGATIVE
pH, UA: 6

## 2013-06-02 NOTE — Progress Notes (Signed)
Subjective:     Patient ID: Kaylee Bryant, female   DOB: August 27, 1965, 48 y.o.   MRN: [id number]  Abdominal Pain The current episode started more than 1 year ago. The problem occurs intermittently. The problem has been waxing and waning. The pain is located in the RLQ. The pain is at a severity of 5/10. The pain is moderate. The quality of the pain is aching, cramping, dull and sharp. The abdominal pain does not radiate. Associated symptoms include arthralgias and constipation. Pertinent negatives include no diarrhea, dysuria, fever, frequency, hematuria, nausea or vomiting. The pain is aggravated by certain positions. Treatments tried: Bentyl. The treatment provided moderate relief. Prior workup: nothing. Her past medical history is significant for gallstones. S/P open chole, diagnosis of MS      This 48 yo G19P4 MW Fe complains of pain right abdomen for about a year.  Saw PCP and started on Bentyl last fall with diagnosis of IBS.  Symptoms were much better.  Then in February started having a jabbing feeling and saw PCP again.  Again thought it was IBS with constipation.  No other med's given.  She wanted other studies done since this was ongoing. The NP spoke with supervising MD and said no. She was last seen there last Friday.    Now comes in because of the ongoing pain with an increase in spasm and now with a character of menstrual type cramps.   Pain is described as sharp to dull that mainly occurs right lower quadrant and is intermittent.  Since [facility] here in October she has been on Provera monthly day 1-10.  She had experienced oligomenorrhea since taking Tysabri IV for MS.  She was given Provera monthly day 1-10 and has had a menses  lasting 3-4 days. Moderate flow. After provera in February no cycle and has not taken Provera yet this month. No dysuria, urgency, frequency.  No changes in bowel other than constipation, no mucous or bleeding. At times will note the pain is not as severe following a BM.   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  Pain will cause her to awaken and have to turn over.    Review of Systems  Constitutional: Negative for fever, chills, diaphoresis, appetite change and fatigue.  Respiratory: Negative.   Cardiovascular: Negative.   Gastrointestinal: Positive for abdominal pain and constipation. Negative for nausea, vomiting, diarrhea, blood in stool, abdominal distention, anal bleeding and rectal pain.  Endocrine: Positive for heat intolerance.  Genitourinary: Positive for vaginal discharge, menstrual problem and pelvic pain. Negative for dysuria, urgency, frequency, hematuria, decreased urine volume, vaginal bleeding, difficulty urinating, genital sores, vaginal pain and dyspareunia.       No history of renal calculi.  Musculoskeletal: Positive for arthralgias, back pain and joint swelling. Negative for neck pain.       Right knee  Neurological:       History of MS with dizzy and unsteady gait.  Psychiatric/Behavioral: The patient is nervous/anxious.        Objective:   Physical Exam  Constitutional: She is oriented to person, place, and time. She appears well-developed and well-nourished. No distress.  Cardiovascular: Normal rate and regular rhythm.   Pulmonary/Chest: Effort normal and breath sounds normal.  Abdominal: Soft. Bowel sounds are normal. She exhibits no distension and no mass. There is no tenderness. There is no rebound and no guarding.  Genitourinary:  Normal vaginal discharge without bleeding, no cervicitis.  No pain on left adnexa, but more pressure and discomfort on the right.  No obvious mass but exam is greatly reduced due to body habitus. Rectal exam is negative for mass. Routine urine is negative.  Musculoskeletal: Normal range of motion. She exhibits no edema and no tenderness.  Straight leg raise and bilaetral hip ROM did not reproduce same pain. No pain over the SI joint spaces.  Neurological:  She is alert and oriented to person, place, and time.  Skin: Skin is warm and dry.  Psychiatric: She has a normal mood and affect. Her behavior is normal. Judgment and thought content normal.       Assessment:     RLQ pain ? Etiology - about a year PCP followed for possible IBS Oligomenorrhea since on Tysabri IV for MS Provera monthly since October 2014 with endometrial risk of hyperplasia Vasectomy for birth control S/P cholecystectomy 1988    Plan:     Will plan to do PUS and follow - want to make sure not ovarian origin of her pain Will get CBC, CMP and urine culture for completion Then if normal I would suggest GI consult for colonoscopy to R/O other inflammatory origin.

## 2013-06-03 LAB — COMPREHENSIVE METABOLIC PANEL
ALT: 22 U/L (ref 0–35)
AST: 20 U/L (ref 0–37)
Albumin: 4.5 g/dL (ref 3.5–5.2)
Alkaline Phosphatase: 44 U/L (ref 39–117)
BUN: 6 mg/dL (ref 6–23)
CO2: 30 mEq/L (ref 19–32)
Calcium: 8.9 mg/dL (ref 8.4–10.5)
Chloride: 103 mEq/L (ref 96–112)
Creat: 0.65 mg/dL (ref 0.50–1.10)
Glucose, Bld: 104 mg/dL — ABNORMAL HIGH (ref 70–99)
Potassium: 4.1 mEq/L (ref 3.5–5.3)
Sodium: 141 mEq/L (ref 135–145)
Total Bilirubin: 0.5 mg/dL (ref 0.2–1.2)
Total Protein: 7.1 g/dL (ref 6.0–8.3)

## 2013-06-03 LAB — URINE CULTURE: Colony Count: 30000

## 2013-06-04 NOTE — Progress Notes (Signed)
Reviewed personally.  M. Suzanne Miller, MD.  

## 2013-06-05 ENCOUNTER — Telehealth: Payer: Self-pay | Admitting: Obstetrics and Gynecology

## 2013-06-05 DIAGNOSIS — R109 Unspecified abdominal pain: Secondary | ICD-10-CM

## 2013-06-05 NOTE — Telephone Encounter (Signed)
Patient called to schedule PUS. Advised patient of $37.81 patient responsibility for PUS.Scheduled PUS. Advised patient of cancellation policy and cancellation fee. Patient agreeable.

## 2013-06-08 NOTE — Addendum Note (Signed)
Addended by: Michele Mcalpine on: 06/08/2013 02:55 PM   Modules accepted: Orders

## 2013-06-11 ENCOUNTER — Ambulatory Visit (INDEPENDENT_AMBULATORY_CARE_PROVIDER_SITE_OTHER): Payer: Managed Care, Other (non HMO)

## 2013-06-11 ENCOUNTER — Ambulatory Visit (INDEPENDENT_AMBULATORY_CARE_PROVIDER_SITE_OTHER): Payer: Managed Care, Other (non HMO) | Admitting: Obstetrics and Gynecology

## 2013-06-11 ENCOUNTER — Encounter: Payer: Self-pay | Admitting: Obstetrics and Gynecology

## 2013-06-11 VITALS — BP 130/78 | Resp 20 | Wt 242.0 lb

## 2013-06-11 DIAGNOSIS — R1031 Right lower quadrant pain: Secondary | ICD-10-CM

## 2013-06-11 DIAGNOSIS — G8929 Other chronic pain: Secondary | ICD-10-CM

## 2013-06-11 DIAGNOSIS — R109 Unspecified abdominal pain: Secondary | ICD-10-CM

## 2013-06-11 NOTE — Progress Notes (Signed)
GYNECOLOGY  VISIT   HPI: 48 y.o.   Married  Caucasian  female   [phone] with Patient's last menstrual period was 04/14/2013.   here for right lower quadrant pain of one year duration.  Feels like labor pains, contraction like pressure. Occurs mostly at night, and goes away with position change. Feels better during the day, but also has menstrual like cramping during daytime. Does not need to take medication for the pain.  Told she has IBS. Bentyl does not help.   Has multiple sclerosis.  Also has chronic back pain.  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 Takes Baclofen and Gabapentin for leg cramps.   Provera challenges started in October by [address] due to missed menses. Took Provera in February and did not have a cycle for the first time. Some hot flashes.   GYNECOLOGIC HISTORY: Patient's last menstrual period was 04/14/2013.    OB History   Grav Para Term Preterm Abortions TAB SAB Ect Mult Living   6 4 4  2 1 1   4          Patient Active Problem List   Diagnosis Date Noted  . S/P cholecystectomy 06/02/2013  . IBS (irritable bowel syndrome) 06/02/2013  . Oligomenorrhea 01/25/2013  . Atypical nevi 01/25/2013  . MS (multiple sclerosis) 01/25/2013  . Abnormal Pap smear 01/25/2013  . Multiple sclerosis 09/02/2012  . Left knee pain 09/02/2012  . Abnormality of gait 09/02/2012    Past Medical History  Diagnosis Date  . Multiple sclerosis   . Fatigue   . Abnormal Pap smear 2004    HPV, cone biopsy  . DM (diabetes mellitus), gestational 2006    insulin at the time    Past Surgical History  Procedure Laterality Date  . Cholecystectomy      1988  . Cesarean section      1996, 2006  . Colposcopy  2004  . Cervical cone biopsy  2004    Current Outpatient Prescriptions  Medication Sig Dispense Refill  . aspirin 325 MG tablet Take 325 mg by mouth every 4 (four) hours as needed.       . baclofen (LIORESAL) 10 MG tablet TAKE ONE TABLET BY MOUTH THREE TIMES DAILY  90  tablet  11  . dicyclomine (BENTYL) 20 MG tablet as needed.      . gabapentin (NEURONTIN) 300 MG capsule TAKE TWO CAPSULES BY MOUTH AT BEDTIME  60 capsule  6  . ibuprofen (ADVIL,MOTRIN) 200 MG tablet Take 800 mg by mouth at bedtime as needed.      . medroxyPROGESTERone (PROVERA) 10 MG tablet Take 1 tablet (10 mg total) by mouth daily.  10 tablet  6  . methylphenidate (RITALIN) 10 MG tablet Take 1 tablet (10 mg total) by mouth 3 (three) times daily with meals. Do not take last dose any later than 4pm  90 tablet  0  . natalizumab (TYSABRI) 300 MG/15ML injection Inject into the vein.       No current facility-administered medications for this visit.     ALLERGIES: Review of patient's allergies indicates no known allergies.  Family History  Problem Relation Age of Onset  . Diabetes Mother   . Hypertension Mother   . Hypertension Father   . Atrial fibrillation Father   . Diabetes Sister   . Breast cancer Maternal Aunt     History   Social History  . Marital Status: Married    Spouse Name: N/A    Number of Children:  4  . Years of Education: college   Occupational History  . RN    Social History Main Topics  . Smoking status: Former Smoker -- 1.50 packs/day for 15 years    Types: Cigarettes    Quit date: 07/04/2003  . Smokeless tobacco: Never Used  . Alcohol Use: 0.6 oz/week    1 Glasses of wine per week  . Drug Use: No  . Sexual Activity: Yes    Partners: Male    Birth Control/ Protection: Surgical     Comment: vasectomy   Other Topics Concern  . Not on file   Social History Narrative   Patient lives at home with her husband and children. Patient works for the [employer] in [place]. College education.     ROS:  Pertinent items are noted in HPI.  PHYSICAL EXAMINATION:    BP 130/78  Resp 20  Wt 242 lb (109.77 kg)  LMP 04/14/2013     No examination.  Ultrasound   See report below.  Report and images reviewed personally by me and with the patient.  Uterus  normal with EMS 7.07 mm.  Ovaries normal. No free fluid.      ASSESSMENT  Chronic right lower quadrant pain.  Possible IBS. Normal pelvic ultrasound.  Status post cholecystectomy and Cesarean Section times two.  Multiple sclerosis. Chronic back pain.  Muscle spasms.  PLAN  Will refer to Gastroenterology for evaluation and possible colonoscopy.   I discussed laparoscopy as a possibility if she completes her evaluation for pain, and it is negative.  We reviewed the procedure including risks and benefits.  Risks include but are not limited to bleeding, infection, damage to surrounding organs, DVT, PE, death, reaction to anesthesia, need for reoperation.  Written material were given to the patient.    An After Visit Summary was printed and given to the patient.  _25_____ minutes face to face time of which over 50% was spent in counseling.

## 2013-06-11 NOTE — Patient Instructions (Signed)
Diagnostic Laparoscopy Laparoscopy is a surgical procedure. It is used to diagnose and treat diseases inside the belly (abdomen). It is usually a brief, common, and relatively simple procedure. The laparoscopeis a thin, lighted, pencil-sized instrument. It is like a telescope. It is inserted into your abdomen through a small cut (incision). Your caregiver can look at the organs inside your body through this instrument. He or she can see if there is anything abnormal. Laparoscopy can be done either in a hospital or outpatient clinic. You may be given a mild sedative to help you relax before the procedure. Once in the operating room, you will be given a drug to make you sleep (general anesthesia). Laparoscopy usually lasts less than 1 hour. After the procedure, you will be monitored in a recovery area until you are stable and doing well. Once you are home, it will take 2 to 3 days to fully recover. RISKS AND COMPLICATIONS  Laparoscopy has relatively few risks. Your caregiver will discuss the risks with you before the procedure. Some problems that can occur include:  Infection.  Bleeding.  Damage to other organs.  Anesthetic side effects. PROCEDURE Once you receive anesthesia, your surgeon inflates the abdomen with a harmless gas (carbon dioxide). This makes the organs easier to see. The laparoscope is inserted into the abdomen through a small incision. This allows your surgeon to see into the abdomen. Other small instruments are also inserted into the abdomen through other small openings. Many surgeons attach a video camera to the laparoscope to enlarge the view. During a diagnostic laparoscopy, the surgeon may be looking for inflammation, infection, or cancer. Your surgeon may take tissue samples(biopsies). The samples are sent to a specialist in looking at cells and tissue samples (pathologist). The pathologist examines them under a microscope. Biopsies can help to diagnose or confirm a  disease. AFTER THE PROCEDURE   The gas is released from inside the abdomen.  The incisions are closed with stitches (sutures). Because these incisions are small (usually less than 1/2 inch), there is usually minimal discomfort after the procedure. There may be some mild discomfort in the throat. This is from the tube placed in the throat while you were sleeping. You may have some mild abdominal discomfort. There may also be discomfort from the instrument placement incisions in the abdomen.  The recovery time is shortened as long as there are no complications.  You will rest in a recovery room until stable and doing well. As long as there are no complications, you may be allowed to go home. FINDING OUT THE RESULTS OF YOUR TEST Not all test results are available during your visit. If your test results are not back during the visit, make an appointment with your caregiver to find out the results. Do not assume everything is normal if you have not heard from your caregiver or the medical facility. It is important for you to follow up on all of your test results. HOME CARE INSTRUCTIONS   Take all medicines as directed.  Only take over-the-counter or prescription medicines for pain, discomfort, or fever as directed by your caregiver.  Resume daily activities as directed.  Showers are preferred over baths.  You may resume sexual activities in 1 week or as directed.  Do not drive while taking narcotics. SEEK MEDICAL CARE IF:   There is increasing abdominal pain.  There is new pain in the shoulders (shoulder strap areas).  You feel lightheaded or faint.  You have the chills.  You or   increasing abdominal pain.  · There is new pain in the shoulders (shoulder strap areas).  · You feel lightheaded or faint.  · You have the chills.  · You or your child has an oral temperature above 102° F (38.9° C).  · There is pus-like (purulent) drainage from any of the wounds.  · You are unable to pass gas or have a bowel movement.  · You feel sick to your stomach (nauseous) or throw up (vomit).  MAKE SURE YOU:   · Understand these instructions.  · Will watch  your condition.  · Will get help right away if you are not doing well or get worse.  Document Released: 06/18/2000 Document Revised: 07/07/2012 Document Reviewed: 03/12/2007  Exit[employer]® Patient Information ©2014 [employer].

## 2013-06-15 ENCOUNTER — Telehealth: Payer: Self-pay | Admitting: Obstetrics and Gynecology

## 2013-06-15 NOTE — Telephone Encounter (Signed)
Per fax received from Dr Youlanda Mighty office. Patient is scheduled 04.07.2015 @ 1445. Patient has been advised.

## 2013-06-19 ENCOUNTER — Encounter (HOSPITAL_COMMUNITY): Payer: Managed Care, Other (non HMO)

## 2013-06-23 ENCOUNTER — Encounter (HOSPITAL_COMMUNITY): Payer: Self-pay

## 2013-06-23 ENCOUNTER — Encounter (HOSPITAL_COMMUNITY)
Admission: RE | Admit: 2013-06-23 | Discharge: 2013-06-23 | Disposition: A | Discharge status: Home / Self Care | Payer: Managed Care, Other (non HMO) | Source: Ambulatory Visit | Attending: Neurology | Admitting: Neurology

## 2013-06-23 VITALS — BP 130/72 | HR 64 | Temp 97.5°F | Resp 20 | Ht 63.0 in | Wt 242.0 lb

## 2013-06-23 DIAGNOSIS — G35 Multiple sclerosis: Secondary | ICD-10-CM

## 2013-06-23 MED ORDER — SODIUM CHLORIDE 0.9 % IV SOLN
300.0000 mg | INTRAVENOUS | Status: AC
  Administered 2013-06-23: 300 mg via INTRAVENOUS
  Filled 2013-06-23: qty 15

## 2013-06-23 MED ORDER — LORATADINE 10 MG PO TABS
10.0000 mg | ORAL_TABLET | ORAL | Status: AC
  Administered 2013-06-23: 10 mg via ORAL
  Filled 2013-06-23: qty 1

## 2013-06-23 MED ORDER — SODIUM CHLORIDE 0.9 % IV SOLN
INTRAVENOUS | Status: AC
  Administered 2013-06-23: 11:00:00 via INTRAVENOUS

## 2013-06-23 MED ORDER — ACETAMINOPHEN 500 MG PO TABS
1000.0000 mg | ORAL_TABLET | ORAL | Status: AC
  Administered 2013-06-23: 1000 mg via ORAL
  Filled 2013-06-23: qty 2

## 2013-06-26 ENCOUNTER — Other Ambulatory Visit: Payer: Self-pay | Admitting: Neurology

## 2013-07-21 ENCOUNTER — Telehealth: Payer: Self-pay | Admitting: *Deleted

## 2013-07-21 NOTE — Telephone Encounter (Signed)
Information given to Butch Penny, RN, please advise

## 2013-07-21 NOTE — Telephone Encounter (Signed)
Please put new orders in Epic for patient's Tysabri.  Infusion scheduled for Thursday.

## 2013-07-21 NOTE — Telephone Encounter (Signed)
Aleen Campi @ [facility] short stay would like a return call @ [phone]..Thanks

## 2013-07-23 ENCOUNTER — Other Ambulatory Visit: Payer: Self-pay | Admitting: Neurology

## 2013-07-23 DIAGNOSIS — G35 Multiple sclerosis: Secondary | ICD-10-CM

## 2013-07-23 NOTE — Telephone Encounter (Signed)
Order for Tysabri infusion was placed.

## 2013-07-24 ENCOUNTER — Encounter (HOSPITAL_COMMUNITY): Payer: Self-pay

## 2013-07-24 ENCOUNTER — Encounter (HOSPITAL_COMMUNITY)
Admission: RE | Admit: 2013-07-24 | Discharge: 2013-07-24 | Disposition: A | Discharge status: Home / Self Care | Payer: Managed Care, Other (non HMO) | Source: Ambulatory Visit | Attending: Neurology | Admitting: Neurology

## 2013-07-24 VITALS — BP 119/68 | HR 71 | Temp 98.2°F | Resp 16

## 2013-07-24 DIAGNOSIS — G35 Multiple sclerosis: Secondary | ICD-10-CM

## 2013-07-24 HISTORY — DX: Irritable bowel syndrome, unspecified: K58.9

## 2013-07-24 MED ORDER — SODIUM CHLORIDE 0.9 % IV SOLN
INTRAVENOUS | Status: DC
  Administered 2013-07-24: 09:00:00 via INTRAVENOUS

## 2013-07-24 MED ORDER — LORATADINE 10 MG PO TABS
10.0000 mg | ORAL_TABLET | ORAL | Status: DC
  Administered 2013-07-24: 10 mg via ORAL
  Filled 2013-07-24: qty 1

## 2013-07-24 MED ORDER — ACETAMINOPHEN 500 MG PO TABS
1000.0000 mg | ORAL_TABLET | ORAL | Status: DC
  Administered 2013-07-24: 1000 mg via ORAL
  Filled 2013-07-24: qty 2

## 2013-07-24 MED ORDER — SODIUM CHLORIDE 0.9 % IV SOLN
300.0000 mg | INTRAVENOUS | Status: DC
  Administered 2013-07-24: 300 mg via INTRAVENOUS
  Filled 2013-07-24: qty 15

## 2013-07-24 NOTE — Progress Notes (Signed)
Patient stayed for 10 minutes of recommended 1 hour observation post Tysabri infusion. Stated she would call Dr. Krista Blue for concerns following discharge from Short Stay.

## 2013-08-21 ENCOUNTER — Encounter (HOSPITAL_COMMUNITY): Payer: Self-pay

## 2013-08-21 ENCOUNTER — Encounter (HOSPITAL_COMMUNITY)
Admission: RE | Admit: 2013-08-21 | Discharge: 2013-08-21 | Disposition: A | Discharge status: Home / Self Care | Payer: Managed Care, Other (non HMO) | Source: Ambulatory Visit | Attending: Neurology | Admitting: Neurology

## 2013-08-21 VITALS — BP 127/75 | HR 67 | Temp 98.4°F | Resp 18 | Ht 63.0 in | Wt 242.0 lb

## 2013-08-21 DIAGNOSIS — G35 Multiple sclerosis: Secondary | ICD-10-CM

## 2013-08-21 MED ORDER — LORATADINE 10 MG PO TABS
10.0000 mg | ORAL_TABLET | ORAL | Status: DC
  Administered 2013-08-21: 10 mg via ORAL
  Filled 2013-08-21: qty 1

## 2013-08-21 MED ORDER — SODIUM CHLORIDE 0.9 % IV SOLN
300.0000 mg | INTRAVENOUS | Status: DC
  Administered 2013-08-21: 300 mg via INTRAVENOUS
  Filled 2013-08-21: qty 15

## 2013-08-21 MED ORDER — ACETAMINOPHEN 500 MG PO TABS
1000.0000 mg | ORAL_TABLET | ORAL | Status: DC
  Administered 2013-08-21: 1000 mg via ORAL
  Filled 2013-08-21: qty 2

## 2013-08-21 MED ORDER — SODIUM CHLORIDE 0.9 % IV SOLN
INTRAVENOUS | Status: DC
  Administered 2013-08-21: 250 mL via INTRAVENOUS

## 2013-08-21 NOTE — Progress Notes (Signed)
Pt stayed 30 minutes of the recommended 1 hour post infusion of TYSABRI observation. Pt states she will contact her MD for any questions or concerns

## 2013-08-24 ENCOUNTER — Telehealth: Payer: Self-pay | Admitting: Nurse Practitioner

## 2013-08-24 NOTE — Telephone Encounter (Signed)
Called patient regarding rescheduling 12/02/13 appointment per Carolyn's schedule, spoke to patient and patient verbalized understanding about new appointment time.

## 2013-09-08 ENCOUNTER — Other Ambulatory Visit: Payer: Self-pay | Admitting: Nurse Practitioner

## 2013-09-08 MED ORDER — METHYLPHENIDATE HCL 10 MG PO TABS: 10.0000 mg | ORAL_TABLET | Freq: Three times a day (TID) | ORAL | Status: DC

## 2013-09-08 NOTE — Telephone Encounter (Signed)
Request forwarded to provider for approval  

## 2013-09-08 NOTE — Telephone Encounter (Signed)
Patient requesting refill of Ritalin to be picked up.

## 2013-09-15 NOTE — Telephone Encounter (Signed)
Called pt and left message informing her that her Rx was ready to be picked up at the front desk and if she has any other problems, questions or concerns to call the office.  °

## 2013-09-18 ENCOUNTER — Telehealth: Payer: Self-pay | Admitting: Neurology

## 2013-09-18 ENCOUNTER — Encounter (HOSPITAL_COMMUNITY): Payer: Self-pay

## 2013-09-18 ENCOUNTER — Encounter (HOSPITAL_COMMUNITY)
Admission: RE | Admit: 2013-09-18 | Discharge: 2013-09-18 | Disposition: A | Discharge status: Home / Self Care | Payer: Managed Care, Other (non HMO) | Source: Ambulatory Visit | Attending: Neurology | Admitting: Neurology

## 2013-09-18 VITALS — BP 129/61 | HR 60 | Temp 98.4°F | Resp 18

## 2013-09-18 DIAGNOSIS — G35 Multiple sclerosis: Secondary | ICD-10-CM

## 2013-09-18 MED ORDER — SODIUM CHLORIDE 0.9 % IV SOLN
300.0000 mg | INTRAVENOUS | Status: AC
  Administered 2013-09-18: 300 mg via INTRAVENOUS
  Filled 2013-09-18: qty 15

## 2013-09-18 MED ORDER — LORATADINE 10 MG PO TABS
10.0000 mg | ORAL_TABLET | ORAL | Status: AC
  Administered 2013-09-18: 10 mg via ORAL
  Filled 2013-09-18: qty 1

## 2013-09-18 MED ORDER — ACETAMINOPHEN 500 MG PO TABS
1000.0000 mg | ORAL_TABLET | ORAL | Status: AC
  Administered 2013-09-18: 1000 mg via ORAL
  Filled 2013-09-18: qty 2

## 2013-09-18 MED ORDER — SODIUM CHLORIDE 0.9 % IV SOLN
INTRAVENOUS | Status: AC
  Administered 2013-09-18: 09:00:00 via INTRAVENOUS

## 2013-09-18 NOTE — Telephone Encounter (Signed)
Patient wants to know if she needs to have her JVC test prior to her apt. In September. Please call to advise.

## 2013-09-18 NOTE — Discharge Instructions (Signed)

## 2013-09-18 NOTE — Telephone Encounter (Signed)
Previous visit reviewed, will order MRI brain w/wo, jc virus antibody titer.

## 2013-09-18 NOTE — Telephone Encounter (Signed)
Spoke to patient. She is requesting to have JCV and MRI test before her appt in Sept. Advised spoke w/ Dr. Krista Blue and she will order. Patient agreed.

## 2013-09-18 NOTE — Progress Notes (Signed)
Pt. Stayed 30 minutes post-Tysabri infusion, pt. To follow-up with doctor with any problems.

## 2013-09-24 ENCOUNTER — Telehealth: Payer: Self-pay | Admitting: Neurology

## 2013-09-24 NOTE — Telephone Encounter (Signed)
Called and left patient a message about sooner appt. With Dr.Yan.

## 2013-09-24 NOTE — Telephone Encounter (Signed)
Kaylee Bryant, Change her appt with me in one month

## 2013-09-28 NOTE — Telephone Encounter (Signed)
Called patient back and left her a sooner appt.

## 2013-10-16 ENCOUNTER — Other Ambulatory Visit: Payer: Self-pay | Admitting: Neurology

## 2013-10-16 ENCOUNTER — Encounter: Payer: Self-pay | Admitting: Neurology

## 2013-10-16 ENCOUNTER — Encounter (HOSPITAL_COMMUNITY)
Admission: RE | Admit: 2013-10-16 | Discharge: 2013-10-16 | Disposition: A | Discharge status: Home / Self Care | Payer: Managed Care, Other (non HMO) | Source: Ambulatory Visit | Attending: Neurology | Admitting: Neurology

## 2013-10-16 VITALS — BP 105/66 | HR 58 | Temp 97.5°F | Resp 18

## 2013-10-16 DIAGNOSIS — G35 Multiple sclerosis: Secondary | ICD-10-CM

## 2013-10-16 MED ORDER — LORATADINE 10 MG PO TABS
10.0000 mg | ORAL_TABLET | ORAL | Status: AC
  Administered 2013-10-16: 10 mg via ORAL
  Filled 2013-10-16: qty 1

## 2013-10-16 MED ORDER — SODIUM CHLORIDE 0.9 % IV SOLN
INTRAVENOUS | Status: AC
  Administered 2013-10-16: 09:00:00 via INTRAVENOUS

## 2013-10-16 MED ORDER — SODIUM CHLORIDE 0.9 % IV SOLN
300.0000 mg | INTRAVENOUS | Status: AC
  Administered 2013-10-16: 300 mg via INTRAVENOUS
  Filled 2013-10-16: qty 15

## 2013-10-16 MED ORDER — GABAPENTIN 300 MG PO CAPS: 300.0000 mg | ORAL_CAPSULE | Freq: Three times a day (TID) | ORAL | Status: DC

## 2013-10-16 MED ORDER — ACETAMINOPHEN 500 MG PO TABS
1000.0000 mg | ORAL_TABLET | ORAL | Status: AC
  Administered 2013-10-16: 1000 mg via ORAL
  Filled 2013-10-16: qty 2

## 2013-10-16 NOTE — Discharge Instructions (Signed)

## 2013-10-19 ENCOUNTER — Other Ambulatory Visit: Payer: Managed Care, Other (non HMO)

## 2013-10-21 ENCOUNTER — Ambulatory Visit
Admission: RE | Admit: 2013-10-21 | Discharge: 2013-10-21 | Disposition: A | Discharge status: Home / Self Care | Payer: Managed Care, Other (non HMO) | Source: Ambulatory Visit | Attending: Neurology | Admitting: Neurology

## 2013-10-21 DIAGNOSIS — G35 Multiple sclerosis: Secondary | ICD-10-CM

## 2013-10-22 NOTE — Telephone Encounter (Signed)
Juliann Pulse call patient ,  MRI of the brain showed evidence of multiple sclerosis, no change from previous scan in February 2015, she should continue with her followup appointment  Multiple supratentorial and infratentorial chronic demyelinating  plaques. 2. No acute plaques. 3. No change from MRI on 05/22/13.

## 2013-11-05 ENCOUNTER — Encounter: Payer: Self-pay | Admitting: Neurology

## 2013-11-05 ENCOUNTER — Other Ambulatory Visit: Payer: Self-pay

## 2013-11-05 MED ORDER — METHYLPHENIDATE HCL 10 MG PO TABS: 10.0000 mg | ORAL_TABLET | Freq: Three times a day (TID) | ORAL | Status: DC

## 2013-11-05 NOTE — Telephone Encounter (Signed)
Dr Krista Blue is out of the office,  Forwarding request to [facility] for approval.

## 2013-11-06 NOTE — Telephone Encounter (Signed)
Called pt and left message informing her that her Rx was ready to be picked up at the front desk and if she has any other problems, questions or concerns to call the office.  °

## 2013-11-09 ENCOUNTER — Ambulatory Visit (INDEPENDENT_AMBULATORY_CARE_PROVIDER_SITE_OTHER): Payer: Managed Care, Other (non HMO) | Admitting: *Deleted

## 2013-11-09 DIAGNOSIS — G35 Multiple sclerosis: Secondary | ICD-10-CM

## 2013-11-09 NOTE — Patient Instructions (Signed)
Will call with results when received and reviewed.

## 2013-11-09 NOTE — Progress Notes (Signed)
Pt here for lab draw for JCV antibody.   Taken to lab area for bright light.  Instructed for JCV antibody.  Under aseptic technique 23g butterfly needle inserted to R outer AC with good blood return.  JCV tube filled.  Needle removed, pressure and bandage applied.   Pt tolerated well.   NAD.   To check out.

## 2013-11-10 ENCOUNTER — Telehealth: Payer: Self-pay | Admitting: *Deleted

## 2013-11-10 NOTE — Telephone Encounter (Signed)
Kaylee Bryant, from [facility] needing orders for tysabri.

## 2013-11-10 NOTE — Telephone Encounter (Signed)
Yes. I do not see anything in chart saying not to continue tysabri. Ask Dr Krista Blue if available

## 2013-11-11 ENCOUNTER — Other Ambulatory Visit: Payer: Self-pay | Admitting: *Deleted

## 2013-11-11 DIAGNOSIS — G35 Multiple sclerosis: Secondary | ICD-10-CM

## 2013-11-11 NOTE — Telephone Encounter (Signed)
Placed orders

## 2013-11-11 NOTE — Telephone Encounter (Signed)
Dr. Krista Blue out until Thursday.   Pt appt for tysabri 11-13-13 0900.

## 2013-11-13 ENCOUNTER — Encounter (HOSPITAL_COMMUNITY): Payer: Self-pay

## 2013-11-13 ENCOUNTER — Encounter (HOSPITAL_COMMUNITY)
Admission: RE | Admit: 2013-11-13 | Discharge: 2013-11-13 | Disposition: A | Discharge status: Home / Self Care | Payer: Managed Care, Other (non HMO) | Source: Ambulatory Visit | Attending: Neurology | Admitting: Neurology

## 2013-11-13 VITALS — BP 121/74 | HR 69 | Temp 97.4°F | Resp 16 | Ht 63.0 in | Wt 243.0 lb

## 2013-11-13 DIAGNOSIS — G35 Multiple sclerosis: Secondary | ICD-10-CM | POA: Diagnosis present

## 2013-11-13 MED ORDER — ACETAMINOPHEN 500 MG PO TABS
1000.0000 mg | ORAL_TABLET | Freq: Once | ORAL | Status: AC
  Administered 2013-11-13: 1000 mg via ORAL
  Filled 2013-11-13: qty 2

## 2013-11-13 MED ORDER — SODIUM CHLORIDE 0.9 % IV SOLN
INTRAVENOUS | Status: DC
  Administered 2013-11-13: 10:00:00 via INTRAVENOUS

## 2013-11-13 MED ORDER — LORATADINE 10 MG PO TABS
10.0000 mg | ORAL_TABLET | Freq: Once | ORAL | Status: AC
  Administered 2013-11-13: 10 mg via ORAL
  Filled 2013-11-13: qty 1

## 2013-11-13 MED ORDER — SODIUM CHLORIDE 0.9 % IV SOLN
300.0000 mg | INTRAVENOUS | Status: DC
  Administered 2013-11-13: 300 mg via INTRAVENOUS
  Filled 2013-11-13: qty 15

## 2013-11-13 NOTE — Progress Notes (Signed)
Patient tolerated tysabri w/o immediate problems.  Aware of need to stay recommended 1 hr post .  Refused to stay whole hour post infusion.

## 2013-11-24 ENCOUNTER — Emergency Department (HOSPITAL_COMMUNITY)
Admission: EM | Admit: 2013-11-24 | Discharge: 2013-11-24 | Disposition: A | Discharge status: Home / Self Care | Payer: Managed Care, Other (non HMO) | Attending: Emergency Medicine | Admitting: Emergency Medicine

## 2013-11-24 ENCOUNTER — Encounter (HOSPITAL_COMMUNITY): Payer: Self-pay | Admitting: Emergency Medicine

## 2013-11-24 DIAGNOSIS — R209 Unspecified disturbances of skin sensation: Secondary | ICD-10-CM | POA: Insufficient documentation

## 2013-11-24 DIAGNOSIS — Z8669 Personal history of other diseases of the nervous system and sense organs: Secondary | ICD-10-CM | POA: Diagnosis not present

## 2013-11-24 DIAGNOSIS — Z7982 Long term (current) use of aspirin: Secondary | ICD-10-CM | POA: Insufficient documentation

## 2013-11-24 DIAGNOSIS — Z87891 Personal history of nicotine dependence: Secondary | ICD-10-CM | POA: Diagnosis not present

## 2013-11-24 DIAGNOSIS — Z79899 Other long term (current) drug therapy: Secondary | ICD-10-CM | POA: Diagnosis not present

## 2013-11-24 DIAGNOSIS — M5430 Sciatica, unspecified side: Secondary | ICD-10-CM | POA: Diagnosis not present

## 2013-11-24 DIAGNOSIS — M549 Dorsalgia, unspecified: Secondary | ICD-10-CM | POA: Diagnosis present

## 2013-11-24 DIAGNOSIS — M5441 Lumbago with sciatica, right side: Secondary | ICD-10-CM

## 2013-11-24 DIAGNOSIS — Z8719 Personal history of other diseases of the digestive system: Secondary | ICD-10-CM | POA: Insufficient documentation

## 2013-11-24 MED ORDER — HYDROCODONE-ACETAMINOPHEN 5-325 MG PO TABS: ORAL_TABLET | ORAL | Status: DC

## 2013-11-24 NOTE — ED Provider Notes (Signed)
Medical screening examination/treatment/procedure(s) were performed by non-physician practitioner and as supervising physician I was immediately available for consultation/collaboration.   EKG Interpretation None        Merryl Hacker, MD 11/24/13 [phone]

## 2013-11-24 NOTE — Discharge Instructions (Signed)
Please read and follow all provided instructions.  Your diagnoses today include:  1. Right-sided low back pain with right-sided sciatica     Tests performed today include:  Vital signs - see below for your results today  Medications prescribed:   Vicodin (hydrocodone/acetaminophen) - narcotic pain medication  DO NOT drive or perform any activities that require you to be awake and alert because this medicine can make you drowsy. BE VERY CAREFUL not to take multiple medicines containing Tylenol (also called acetaminophen). Doing so can lead to an overdose which can damage your liver and cause liver failure and possibly death.  Take any prescribed medications only as directed.  Home care instructions:   Follow any educational materials contained in this packet  Please rest, use ice or heat on your back for the next several days  Do not lift, push, pull anything more than 10 pounds for the next week  Follow-up instructions: Please follow-up with your primary care provider in the next 1 week for further evaluation of your symptoms.   Return instructions:  SEEK IMMEDIATE MEDICAL ATTENTION IF YOU HAVE:  New numbness, tingling, weakness, or problem with the use of your arms or legs  Severe back pain not relieved with medications  Loss control of your bowels or bladder  Increasing pain in any areas of the body (such as chest or abdominal pain)  Shortness of breath, dizziness, or fainting.   Worsening nausea (feeling sick to your stomach), vomiting, fever, or sweats  Any other emergent concerns regarding your health   Additional Information:  Your vital signs today were: BP 124/59   Pulse 77   Temp(Src) 98.2 F (36.8 C) (Oral)   Resp 18   SpO2 96%   LMP 11/14/2013 If your blood pressure (BP) was elevated above 135/85 this visit, please have this repeated by your doctor within one month. --------------

## 2013-11-24 NOTE — ED Notes (Signed)
One month hx of low back pain radiating to r/side.

## 2013-11-24 NOTE — ED Provider Notes (Signed)
CSN: [id number]     Arrival date & time 11/24/13  1303 History   First MD Initiated Contact with Patient 11/24/13 1309    This chart was scribed for Carlisle Cater, PA, with Merryl Hacker, MD by Edison Simon, ED Scribe. This patient was seen in room WTR6/WTR6 and the patient's care was started at 1:18 PM.    Chief Complaint  Patient presents with  . Back Pain   The history is provided by the patient. No language interpreter was used.   HPI Comments: Kaylee Bryant is a 48 y.o. 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 She denies a history of chronic back problems. She reports a history of multiple sclerosis though not localized to her lumbar area at her most recent scan and states current symptoms are not similar to previous MS sx. She also reports pain in her right hip down her right leg, chracterized as shooting. She reports some numbness and tingling in her lower extremities that she has had for 8 years. She denies associated weakness. She has some dysequilibrium at baseline, unchanged. No red flag s/s of low back pain.   Past Medical History  Diagnosis Date  . Multiple sclerosis   . Fatigue   . Abnormal Pap smear 2004    HPV, cone biopsy  . DM (diabetes mellitus), gestational 2006    insulin at the time  . IBS (irritable bowel syndrome)     with chronic constipation   Past Surgical History  Procedure Laterality Date  . Cholecystectomy      1988  . Cesarean section      1996, 2006  . Colposcopy  2004  . Cervical cone biopsy  2004   Family History  Problem Relation Age of Onset  . Diabetes Mother   . Hypertension Mother   . Hypertension Father   . Atrial fibrillation Father   . Diabetes Sister   . Breast cancer Maternal Aunt    History  Substance Use Topics  .  Smoking status: Former Smoker -- 1.50 packs/day for 15 years    Types: Cigarettes    Quit date: 07/04/2003  . Smokeless tobacco: Never Used  . Alcohol Use: 0.6 oz/week    1 Glasses of wine per week   OB History   Grav Para Term Preterm Abortions TAB SAB Ect Mult Living   6 4 4  2 1 1   4      Review of Systems  Constitutional: Negative for fever and unexpected weight change.  Cardiovascular: Negative for leg swelling.  Gastrointestinal: Negative for constipation.       Negative for fecal incontinence.   Genitourinary: Negative for dysuria, hematuria, flank pain, vaginal bleeding, vaginal discharge and pelvic pain.       Negative for urinary incontinence or retention.  Musculoskeletal: Positive for back pain.  Neurological: Positive for numbness (baseline). Negative for weakness.       Denies saddle paresthesias.      Allergies  Review of patient's allergies indicates no known allergies.  Home Medications   Prior to Admission medications   Medication Sig Start Date End Date Taking? Authorizing Provider  aspirin 325 MG tablet Take 325 mg by mouth every 4 (four) hours as needed.     Historical Provider, MD  baclofen (LIORESAL) 10  MG tablet TAKE ONE TABLET BY MOUTH THREE TIMES DAILY 06/26/13   Marcial Pacas, MD  dicyclomine (BENTYL) 20 MG tablet as needed. 05/27/13   Historical Provider, MD  gabapentin (NEURONTIN) 300 MG capsule TAKE TWO CAPSULES BY MOUTH DAILY AT BEDTIME 06/26/13   Marcial Pacas, MD  gabapentin (NEURONTIN) 300 MG capsule Take 1 capsule (300 mg total) by mouth 3 (three) times daily. 10/16/13   Hulen Luster, DO  ibuprofen (ADVIL,MOTRIN) 200 MG tablet Take 800 mg by mouth at bedtime as needed.    Historical Provider, MD  Linaclotide Rolan Lipa) 145 MCG CAPS capsule Take 145 mcg by mouth as needed.    Historical Provider, MD  medroxyPROGESTERone (PROVERA) 10 MG tablet Take 1 tablet (10 mg total) by mouth daily. 01/23/13   Milford Cage, FNP  methylphenidate (RITALIN) 10  MG tablet Take 1 tablet (10 mg total) by mouth 3 (three) times daily with meals. Do not take last dose any later than 4pm 11/05/13   Larey Seat, MD  natalizumab (TYSABRI) 300 MG/15ML injection Inject into the vein.    Historical Provider, MD  Probiotic Product ([place]) Take by mouth 1 day or 1 dose.    Historical Provider, MD   LMP 11/14/2013 Physical Exam  Nursing note and vitals reviewed. Constitutional: She appears well-developed and well-nourished.  HENT:  Head: Normocephalic and atraumatic.  Eyes: Conjunctivae are normal.  Neck: Normal range of motion. Neck supple.  Pulmonary/Chest: Effort normal.  Abdominal: Soft. There is no tenderness. There is no CVA tenderness.  Musculoskeletal: Normal range of motion.       Cervical back: Normal.       Thoracic back: Normal.       Lumbar back: She exhibits tenderness. She exhibits normal range of motion and no bony tenderness.       Back:  No step-off noted with palpation of spine.   Neurological: She is alert. She has normal strength and normal reflexes. No sensory deficit. Coordination and gait normal. GCS eye subscore is 4. GCS verbal subscore is 5. GCS motor subscore is 6.  5/5 strength in entire lower extremities bilaterally. No sensation deficit.   Skin: Skin is warm and dry. No rash noted.  Psychiatric: She has a normal mood and affect.    ED Course  Procedures (including critical care time) Labs Review Labs Reviewed - No data to display  Imaging Review No results found.   EKG Interpretation None     DIAGNOSTIC STUDIES: Oxygen Saturation is 96% on room air, normal by my interpretation.    COORDINATION OF CARE:  1:38 PM Patient seen and examined. Work-up initiated. Medications ordered.   Vital signs reviewed and are as follows: Filed Vitals:   11/24/13 1312  BP: 124/59  Pulse: 77  Temp: 98.2 F (36.8 C)  Resp: 18    No red flag s/s of low back pain. Patient was counseled on back pain  precautions and told to do activity as tolerated but do not lift, push, or pull heavy objects more than 10 pounds for the next week.  Patient counseled to use ice or heat on back for no longer than 15 minutes every hour.   Patient is on muscle relaxant medication. She will continue.   Patient prescribed narcotic pain medicine and counseled on proper use of narcotic pain medications. Counseled not to combine this medication with others containing tylenol.   Urged patient not to drink alcohol, drive, or perform any other activities that requires focus  while taking either of these medications.  Patient urged to follow-up with PCP if pain does not improve with treatment and rest or if pain becomes recurrent. Urged to return with worsening severe pain, loss of bowel or bladder control, trouble walking.   The patient verbalizes understanding and agrees with the plan.   MDM   Final diagnoses:  Right-sided low back pain with right-sided sciatica   Patient with back pain - new over past month. No injury. No new neurological deficits. Patient is ambulatory. It is tough to know if this is related to MS or injury from work which is also possible. Regardless, no warning symptoms of back pain including: fecal incontinence, urinary retention or overflow incontinence, night sweats, waking from sleep with back pain, unexplained fevers or weight loss, h/o cancer, IVDU, recent trauma. No concern for cauda equina, epidural abscess, or other serious cause of back pain. Conservative measures such as rest, ice/heat and pain medicine indicated with PCP follow-up if no improvement with conservative management.     I personally performed the services described in this documentation, which was scribed in my presence. The recorded information has been reviewed and is accurate.   Carlisle Cater, PA-C 11/24/13 1339

## 2013-11-24 NOTE — ED Notes (Signed)
Per pt, back has been hurting for a while and today pulled it at work.  Pt has hx of ms.  Pain in lower mid back.  No difficulty urinating.

## 2013-12-02 ENCOUNTER — Ambulatory Visit: Payer: Managed Care, Other (non HMO) | Admitting: Nurse Practitioner

## 2013-12-03 ENCOUNTER — Other Ambulatory Visit: Payer: Self-pay | Admitting: Family Medicine

## 2013-12-03 DIAGNOSIS — R1084 Generalized abdominal pain: Secondary | ICD-10-CM

## 2013-12-03 DIAGNOSIS — R109 Unspecified abdominal pain: Secondary | ICD-10-CM

## 2013-12-08 ENCOUNTER — Ambulatory Visit
Admission: RE | Admit: 2013-12-08 | Discharge: 2013-12-08 | Disposition: A | Discharge status: Home / Self Care | Payer: Managed Care, Other (non HMO) | Source: Ambulatory Visit | Attending: Family Medicine | Admitting: Family Medicine

## 2013-12-08 DIAGNOSIS — R1084 Generalized abdominal pain: Secondary | ICD-10-CM

## 2013-12-08 DIAGNOSIS — R109 Unspecified abdominal pain: Secondary | ICD-10-CM

## 2013-12-14 ENCOUNTER — Encounter (HOSPITAL_COMMUNITY): Payer: Managed Care, Other (non HMO)

## 2013-12-16 ENCOUNTER — Encounter: Payer: Self-pay | Admitting: Nurse Practitioner

## 2013-12-16 ENCOUNTER — Ambulatory Visit (INDEPENDENT_AMBULATORY_CARE_PROVIDER_SITE_OTHER): Payer: Managed Care, Other (non HMO) | Admitting: Nurse Practitioner

## 2013-12-16 VITALS — BP 138/78 | HR 82 | Ht 63.0 in | Wt 240.8 lb

## 2013-12-16 DIAGNOSIS — R269 Unspecified abnormalities of gait and mobility: Secondary | ICD-10-CM

## 2013-12-16 DIAGNOSIS — G35 Multiple sclerosis: Secondary | ICD-10-CM

## 2013-12-16 MED ORDER — METHYLPHENIDATE HCL 10 MG PO TABS: 10.0000 mg | ORAL_TABLET | Freq: Three times a day (TID) | ORAL | Status: DC

## 2013-12-16 NOTE — Progress Notes (Signed)
[facility]  PATIENT: Kaylee Bryant DOB: 1965/06/04   REASON FOR VISIT:follow up for MS    HISTORY OF PRESENT ILLNESS: HISTORY: She was diagnosed with RRMS in 2007, 6 month post partum, she had numbness from waist done, diagnosis was confirmed by marked abnormal MRI, I reviewed the film from [facility] in March,2007. There were multiple T2/Flair periventricular lesions, corpus callosum involvement, mutiple ring enhancing lesions. Also had enhancing lesion in cervical and thoracic spines. She was treated at [facility] by Dr. Dellis Filbert, no csf study, but had extensive lab evaluations to rule out mimics. 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 She denies visual changes or other clear relapsing episodes.  MRI was done by Dr. Brandon Melnick in 01/2007, I have report only,MRI brain, multiple T2/FLAIR lesions in both hemisphere with corpus callosum involvement, there are slight increase in lesion load. MRI cervical, stable lesions in C6-7, and C2-3. Thoracic, multiple non enhancing lesions.  She tolerated Rebif well, no flu like illness, but complains of bilateral arm, and the lateral thigh lipoatrophy with repeat Rebif injection. There was no clear relapsed over the years, but she has slow worsening gait difficulty over the past 6 months.  Most recent MRI in Sept 2012, MRI of thoracic spine Multiple chronic plaques at C7, T6-7, T8-9, T10. MRI of cervical spine ,Multiple chronic demyelinating plaques at C3, C4 and C7. MRI of brain Multiple round and ovioid, periventricular and subcortical chronic demyelinating plaques. 2 plaques noted in the pons. No abnormal lesions are seen on post contrast views.  Her JC-virus antibody was negative. After discuss, we started her  on Tysarbri infusion since February of 2013. She tolerated the infusion very well, but complains of excessive fatigue over the past few months, she still able to work full-time as a Marine scientist, she complains of difficulty dragging herself out of bed. Modafinil was not approved by insurance.She was put on Ritalin 77m tid, which helped her fatigue. feels much better on Tysabri.  JC-virus antibody negative (July 2014), Negative 0.17 in 11/11/2013,  UPDATE December 16 2013: Kaylee Bryant returns for followup after her last visit with Dr. YKrista BlueFebruary 9 2015. She is doing very well, her gait has improved, tolerating TDwyane Dee Ritalin 10 mg tid. for her fatigue.JC-virus antibody negative 11/11/2013 . Most recent MRI of the brain 10/22/2011 with multiple supra tentorial and infra-tentorial chronic demyelinating plaques. No acute plaques. No change from MRI 05/22/2013. She returns for reevaluation   REVIEW OF SYSTEMS: Full 14 system review of systems performed and notable only for those listed, all others are neg:  Constitutional: N/A  Cardiovascular: N/A  Ear/Nose/Throat: N/A  Skin: N/A  Eyes: N/A  Respiratory: N/A  Gastroitestinal: N/A  Hematology/Lymphatic: N/A  Endocrine: N/A Musculoskeletal:back pain Allergy/Immunology: N/A  Neurological: Dizziness Psychiatric: N/A Sleep : NA   ALLERGIES: No Known Allergies  HOME MEDICATIONS: Outpatient Prescriptions Prior to Visit  Medication Sig Dispense Refill  . aspirin 325 MG tablet Take 325 mg by mouth every 4 (four) hours as needed.       . baclofen (LIORESAL) 10 MG tablet TAKE ONE TABLET BY MOUTH THREE TIMES DAILY  90 tablet  5  . gabapentin (NEURONTIN) 300 MG capsule Take 1 capsule (300 mg total) by mouth 3 (three) times daily.  90 capsule  11  .  HYDROcodone-acetaminophen (NORCO/VICODIN) 5-325 MG per tablet Take 1-2 tablets every 6 hours as needed for severe pain  12 tablet  0  . ibuprofen (ADVIL,MOTRIN) 200 MG tablet Take 800 mg by mouth at bedtime as  needed.      . Linaclotide (LINZESS) 145 MCG CAPS capsule Take 145 mcg by mouth as needed.      . methylphenidate (RITALIN) 10 MG tablet Take 1 tablet (10 mg total) by mouth 3 (three) times daily with meals. Do not take last dose any later than 4pm  90 tablet  0  . natalizumab (TYSABRI) 300 MG/15ML injection Inject into the vein.      . Probiotic Product (ULTRAFLORA IMMUNE HEALTH PO) Take by mouth 1 day or 1 dose.      . dicyclomine (BENTYL) 20 MG tablet as needed.      . gabapentin (NEURONTIN) 300 MG capsule TAKE TWO CAPSULES BY MOUTH DAILY AT BEDTIME  60 capsule  5  . medroxyPROGESTERone (PROVERA) 10 MG tablet Take 1 tablet (10 mg total) by mouth daily.  10 tablet  6   No facility-administered medications prior to visit.    PAST MEDICAL HISTORY: Past Medical History  Diagnosis Date  . Multiple sclerosis   . Fatigue   . Abnormal Pap smear 2004    HPV, cone biopsy  . DM (diabetes mellitus), gestational 2006    insulin at the time  . IBS (irritable bowel syndrome)     with chronic constipation    PAST SURGICAL HISTORY: Past Surgical History  Procedure Laterality Date  . Cholecystectomy      1988  . Cesarean section      1996, 2006  . Colposcopy  2004  . Cervical cone biopsy  2004    FAMILY HISTORY: Family History  Problem Relation Age of Onset  . Diabetes Mother   . Hypertension Mother   . Hypertension Father   . Atrial fibrillation Father   . Diabetes Sister   . Breast cancer Maternal Aunt     SOCIAL HISTORY: History   Social History  . Marital Status: Married    Spouse Name: N/A    Number of Children: 4  . Years of Education: college   Occupational History  . RN    Social History Main Topics  . Smoking status: Former Smoker -- 1.50 packs/day for 15 years    Types: Cigarettes    Quit date: 07/04/2003  . Smokeless tobacco: Never Used  . Alcohol Use: 0.6 oz/week    1 Glasses of wine per week  . Drug Use: No  . Sexual Activity: Yes    Partners: Male     Birth Control/ Protection: Surgical     Comment: vasectomy   Other Topics Concern  . Not on file   Social History Narrative   Patient lives at home with her husband and children. Patient works for the [employer] in [place]. College education.      PHYSICAL EXAM  Filed Vitals:   12/16/13 1533  BP: 138/78  Pulse: 82  Height: 5' 3"  (1.6 m)  Weight: 240 lb 12.8 oz (109.226 kg)   Body mass index is 42.67 kg/(m^2). General: Well groomed and well nourished  Neck: supple no carotid bruits  Respiratory: LCA  Cardiovascular: RRR  Neurologic Exam  Mental Status: pleasant, awake, alert, cooperative to history, talking, and casual conversation.  Cranial Nerves: CN II-XII pupils were equal round reactive to light. Fundi were sharp bilaterally. Extraocular movements were full. Visual fields  were full on confrontational test. Facial sensation and strength were normal. Uvula tongue were midline. Head turning and shoulder shrugging were normal and symmetric. Tongue protrusion into the cheeks strength were normal.  Motor: Strength normal and symmetric upper and lower extremities no focal weakness.  Sensory: length dependent decreased light touch, pinprick to toes, normal proprioception, and decreased vibratory sensation to toes.  Coordination: Normal finger-to-nose, heel-to-shin. There was no dysmetria noticed.  Gait and Station: wide based and stiff, steady is able to perform tiptoe, and heel walking no assistive device Reflexes: Deep tendon reflexes: Biceps: 2/2, Brachioradialis: 2/2, Triceps: 2/2, Pateller: 2/2, Achilles: 1/1. Plantar responses are flexor.   DIAGNOSTIC DATA (LABS, IMAGING, TESTING) - I reviewed patient records, labs, notes, testing and imaging myself where available.  Lab Results  Component Value Date   WBC 10.1 06/02/2013   HGB 13.4 06/02/2013   HCT 38.8 06/02/2013   MCV 85.7 06/02/2013   PLT 334 06/02/2013      Component Value Date/Time   NA 141 06/02/2013 1640   K 4.1  06/02/2013 1640   CL 103 06/02/2013 1640   CO2 30 06/02/2013 1640   GLUCOSE 104* 06/02/2013 1640   BUN 6 06/02/2013 1640   CREATININE 0.65 06/02/2013 1640   CREATININE 0.53 07/02/2010 1516   CALCIUM 8.9 06/02/2013 1640   PROT 7.1 06/02/2013 1640   ALBUMIN 4.5 06/02/2013 1640   AST 20 06/02/2013 1640   ALT 22 06/02/2013 1640   ALKPHOS 44 06/02/2013 1640   BILITOT 0.5 06/02/2013 1640   GFRNONAA >60 07/02/2010 1516   GFRAA  Value: >60        The eGFR has been calculated using the MDRD equation. This calculation has not been validated in all clinical situations. eGFR's persistently <60 mL/min signify possible Chronic Kidney Disease. 07/02/2010 1516    ASSESSMENT AND PLAN  48 y.o. year old female  has a past medical history of Multiple sclerosis; Fatigue; here to followup.JC-virus antibody negative 11/11/2013 . Most recent MRI of the brain 10/22/2011 with multiple supra tentorial and infra-tentorial chronic demyelinating plaques. No acute plaques. No change from MRI 05/22/2013.  Continue Ritalin 10 mg 3 times daily RX given to patient Continues Tysabri monthly Followup in 6 months next visit with Dr. Luan Pulling, [facility], APRN  [facility] [address] [place], [place] [address] [phone]

## 2013-12-16 NOTE — Patient Instructions (Signed)
Continue Ritalin 10 mg 3 times daily Continues Tysabri monthly JC antibody  Negative MRI 10/21/2013 without change from 05/22/2013 Followup in 6 months next visit with Dr. Krista Blue

## 2013-12-17 ENCOUNTER — Encounter: Payer: Self-pay | Admitting: Nurse Practitioner

## 2013-12-18 ENCOUNTER — Encounter (HOSPITAL_COMMUNITY)
Admission: RE | Admit: 2013-12-18 | Discharge: 2013-12-18 | Disposition: A | Discharge status: Home / Self Care | Payer: Managed Care, Other (non HMO) | Source: Ambulatory Visit | Attending: Neurology | Admitting: Neurology

## 2013-12-18 ENCOUNTER — Encounter (HOSPITAL_COMMUNITY): Payer: Self-pay

## 2013-12-18 VITALS — BP 136/77 | HR 66 | Temp 97.4°F | Resp 16 | Ht 63.0 in | Wt 240.0 lb

## 2013-12-18 DIAGNOSIS — G35 Multiple sclerosis: Secondary | ICD-10-CM | POA: Diagnosis not present

## 2013-12-18 HISTORY — DX: Other intervertebral disc degeneration, lumbar region without mention of lumbar back pain or lower extremity pain: M51.369

## 2013-12-18 HISTORY — DX: Other intervertebral disc degeneration, lumbar region: M51.36

## 2013-12-18 MED ORDER — SODIUM CHLORIDE 0.9 % IV SOLN
300.0000 mg | INTRAVENOUS | Status: DC
  Administered 2013-12-18: 300 mg via INTRAVENOUS
  Filled 2013-12-18: qty 15

## 2013-12-18 MED ORDER — LORATADINE 10 MG PO TABS
10.0000 mg | ORAL_TABLET | ORAL | Status: DC
  Administered 2013-12-18: 10 mg via ORAL
  Filled 2013-12-18: qty 1

## 2013-12-18 MED ORDER — SODIUM CHLORIDE 0.9 % IV SOLN
INTRAVENOUS | Status: DC
  Administered 2013-12-18: 250 mL via INTRAVENOUS

## 2013-12-18 MED ORDER — ACETAMINOPHEN 500 MG PO TABS
1000.0000 mg | ORAL_TABLET | ORAL | Status: DC
  Administered 2013-12-18: 1000 mg via ORAL
  Filled 2013-12-18: qty 2

## 2013-12-18 NOTE — Telephone Encounter (Signed)
This encounter was created in error - please disregard.

## 2013-12-18 NOTE — Progress Notes (Signed)
Uneventful infusion of TYSABRI. Pt declined to stay the entire 1 recommended post infusion observation. She stayed 35 minutes and will call her MD for questions or concerns

## 2014-01-11 ENCOUNTER — Encounter (HOSPITAL_COMMUNITY): Payer: Managed Care, Other (non HMO)

## 2014-01-15 ENCOUNTER — Encounter (HOSPITAL_COMMUNITY): Payer: Self-pay

## 2014-01-15 ENCOUNTER — Encounter (HOSPITAL_COMMUNITY)
Admission: RE | Admit: 2014-01-15 | Discharge: 2014-01-15 | Disposition: A | Discharge status: Home / Self Care | Payer: Managed Care, Other (non HMO) | Source: Ambulatory Visit | Attending: Neurology | Admitting: Neurology

## 2014-01-15 VITALS — BP 135/75 | HR 68 | Temp 97.9°F | Resp 16

## 2014-01-15 DIAGNOSIS — G35 Multiple sclerosis: Secondary | ICD-10-CM

## 2014-01-15 MED ORDER — SODIUM CHLORIDE 0.9 % IV SOLN
INTRAVENOUS | Status: DC
  Administered 2014-01-15: 10:00:00 via INTRAVENOUS

## 2014-01-15 MED ORDER — ACETAMINOPHEN 500 MG PO TABS
1000.0000 mg | ORAL_TABLET | ORAL | Status: AC
  Administered 2014-01-15: 1000 mg via ORAL
  Filled 2014-01-15: qty 2

## 2014-01-15 MED ORDER — SODIUM CHLORIDE 0.9 % IV SOLN
300.0000 mg | INTRAVENOUS | Status: AC
  Administered 2014-01-15: 300 mg via INTRAVENOUS
  Filled 2014-01-15: qty 15

## 2014-01-15 MED ORDER — LORATADINE 10 MG PO TABS
10.0000 mg | ORAL_TABLET | ORAL | Status: DC
  Administered 2014-01-15: 10 mg via ORAL
  Filled 2014-01-15: qty 1

## 2014-01-25 ENCOUNTER — Encounter: Payer: Self-pay | Admitting: Nurse Practitioner

## 2014-01-25 ENCOUNTER — Ambulatory Visit (INDEPENDENT_AMBULATORY_CARE_PROVIDER_SITE_OTHER): Payer: Managed Care, Other (non HMO) | Admitting: Nurse Practitioner

## 2014-01-25 VITALS — BP 130/76 | HR 80 | Ht 63.0 in | Wt 240.0 lb

## 2014-01-25 DIAGNOSIS — N926 Irregular menstruation, unspecified: Secondary | ICD-10-CM | POA: Insufficient documentation

## 2014-01-25 DIAGNOSIS — G35 Multiple sclerosis: Secondary | ICD-10-CM

## 2014-01-25 DIAGNOSIS — Z0000 Encounter for general adult medical examination without abnormal findings: Secondary | ICD-10-CM

## 2014-01-25 DIAGNOSIS — K589 Irritable bowel syndrome without diarrhea: Secondary | ICD-10-CM

## 2014-01-25 DIAGNOSIS — N915 Oligomenorrhea, unspecified: Secondary | ICD-10-CM

## 2014-01-25 DIAGNOSIS — Z01419 Encounter for gynecological examination (general) (routine) without abnormal findings: Secondary | ICD-10-CM

## 2014-01-25 MED ORDER — MEDROXYPROGESTERONE ACETATE 10 MG PO TABS: 10.0000 mg | ORAL_TABLET | Freq: Every day | ORAL | Status: DC

## 2014-01-25 MED ORDER — EFLORNITHINE HCL 13.9 % EX CREA: 1.0000 "application " | TOPICAL_CREAM | Freq: Two times a day (BID) | CUTANEOUS | Status: DC

## 2014-01-25 NOTE — Patient Instructions (Signed)

## 2014-01-25 NOTE — Progress Notes (Signed)
Patient ID: Kaylee Bryant, female   DOB: [birth year], 48 y.o.   MRN: [id number] 48 y.o. [id number] Married Caucasian Fe here for annual exam.  This past year irregular menses every 3-4 months.  No menses since June.  Feels at times like menses is going to start. Her history has been since starting on Tysabri she has had irregular menses.  She was on Provera every 3 months for amenorrhea since last AEX.  No vaso symptoms.  For a short time was having right side pain and not sure if related to Provera so stopped med's several months ago.  Dr. Collene Mares has done evaluation and now  following with Linzess for constipation.  MS with treatment once a month with Tysabri - now in remission.  Works as Marine scientist in wound care.  Patient's last menstrual period was 08/24/2013 (approximate).          Sexually active: yes  The current method of family planning is vasectomy.  Exercising: no The patient does not participate in regular exercise at present. Smoker: Former, quit in 2005  Health Maintenance: Pap: 06/2011, normal with negative HR HPV, (history of abnormal and HPV 2004) MMG: 02/23/13  normal TDaP: 01/23/13 Labs:  PCP   reports that she quit smoking about 10 years ago. Her smoking use included Cigarettes. She has a 22.5 pack-year smoking history. She has never used smokeless tobacco. She reports that she drinks about 0.6 oz of alcohol per week. She reports that she does not use illicit drugs.  Past Medical History  Diagnosis Date  . Multiple sclerosis   . Fatigue   . Abnormal Pap smear 2004    HPV, cone biopsy  . DM (diabetes mellitus), gestational 2006    insulin at the time  . IBS (irritable bowel syndrome)     with chronic constipation  . DDD (degenerative disc disease), lumbar     Past Surgical History  Procedure Laterality Date  . Cholecystectomy      1988  . Cesarean section      1996, 2006  . Colposcopy  2004  . Cervical cone biopsy  2004    Current Outpatient Prescriptions   Medication Sig Dispense Refill  . baclofen (LIORESAL) 10 MG tablet TAKE ONE TABLET BY MOUTH THREE TIMES DAILY 90 tablet 5  . gabapentin (NEURONTIN) 300 MG capsule Take 1 capsule (300 mg total) by mouth 3 (three) times daily. 90 capsule 11  . ibuprofen (ADVIL,MOTRIN) 200 MG tablet Take 800 mg by mouth at bedtime as needed.    . Linaclotide (LINZESS) 145 MCG CAPS capsule Take 145 mcg by mouth as needed.    . methylphenidate (RITALIN) 10 MG tablet Take 1 tablet (10 mg total) by mouth 3 (three) times daily with meals. Do not take last dose any later than 4pm 90 tablet 0  . natalizumab (TYSABRI) 300 MG/15ML injection Inject into the vein.    . Probiotic Product (ULTRAFLORA IMMUNE HEALTH PO) Take by mouth 1 day or 1 dose.    . Eflornithine HCl (VANIQA) 13.9 % cream Apply 1 application topically 2 (two) times daily with a meal. 30 g 1  . medroxyPROGESTERone (PROVERA) 10 MG tablet Take 1 tablet (10 mg total) by mouth daily. 10 tablet 0   No current facility-administered medications for this visit.    Family History  Problem Relation Age of Onset  . Diabetes Mother   . Hypertension Mother   . Hypertension Father   . Atrial fibrillation Father   . Diabetes  Sister   . Breast cancer Maternal Aunt     ROS:  Pertinent items are noted in HPI.  Otherwise, a comprehensive ROS was negative.  Exam:   BP 130/76 mmHg  Pulse 80  Ht 5\' 3"  (1.6 m)  Wt 240 lb (108.863 kg)  BMI 42.52 kg/m2  LMP 08/24/2013 (Approximate) Height: 5\' 3"  (160 cm)  Ht Readings from Last 3 Encounters:  01/25/14 5\' 3"  (1.6 m)  12/18/13 5\' 3"  (1.6 m)  12/16/13 5\' 3"  (1.6 m)    General appearance: alert, cooperative and appears stated age Head: Normocephalic, without obvious abnormality, atraumatic Neck: no adenopathy, supple, symmetrical, trachea midline and thyroid normal to inspection and palpation Lungs: clear to auscultation bilaterally Breasts: normal appearance, no masses or tenderness Heart: regular rate and  rhythm Abdomen: soft, non-tender; no masses,  no organomegaly Extremities: extremities normal, atraumatic, no cyanosis or edema Skin: Skin color, texture, turgor normal. No rashes or lesions Lymph nodes: Cervical, supraclavicular, and axillary nodes normal. No abnormal inguinal nodes palpated Neurologic: Grossly normal   Pelvic: External genitalia:  no lesions              Urethra:  normal appearing urethra with no masses, tenderness or lesions              Bartholin's and Skene's: normal                 Vagina: normal appearing vagina with normal color and discharge, no lesions              Cervix: anteverted              Pap taken: No. Bimanual Exam:  Uterus:  normal size, contour, position, consistency, mobility, non-tender              Adnexa: no mass, fullness, tenderness               Rectovaginal: Confirms               Anus:  normal sphincter tone, no lesions  A:  Well Woman with normal exam  History of menorrhagia History of MS - currently on Tysabri for 2 years Husband with vasectomy Currently oligomenorrhea - at risk for endo hyperplasia  History of excessive facial hair Remote history of abnormal pap in 2004 treated with cervical conization.   P:   Reviewed health and wellness pertinent to exam  Pap smear not  taken today  Mammogram is due 12/15  Rx. for Provera 10 mg X 10 days - she is to CB with +/- results - she will then need to call every 3 months if no menses.  Menstrual calendar  Per request will try Vaniqa to use as directed to face BID   Counseled on breast self exam, mammography screening, adequate intake of calcium and vitamin D, diet and exercise, Kegel's exercises return annually or prn  An After Visit Summary was printed and given to the patient.

## 2014-01-27 NOTE — Progress Notes (Signed)
Encounter reviewed by Dr. Brook Silva.  

## 2014-02-03 ENCOUNTER — Encounter: Payer: Self-pay | Admitting: Neurology

## 2014-02-08 ENCOUNTER — Telehealth: Payer: Self-pay | Admitting: Neurology

## 2014-02-08 ENCOUNTER — Other Ambulatory Visit: Payer: Self-pay | Admitting: Neurology

## 2014-02-08 DIAGNOSIS — G35 Multiple sclerosis: Secondary | ICD-10-CM

## 2014-02-08 NOTE — Telephone Encounter (Signed)
Order for Kaylee Bryant was placed

## 2014-02-12 ENCOUNTER — Encounter (HOSPITAL_COMMUNITY)
Admission: RE | Admit: 2014-02-12 | Discharge: 2014-02-12 | Disposition: A | Discharge status: Home / Self Care | Payer: Managed Care, Other (non HMO) | Source: Ambulatory Visit | Attending: Neurology | Admitting: Neurology

## 2014-02-12 ENCOUNTER — Encounter (HOSPITAL_COMMUNITY): Payer: Self-pay

## 2014-02-12 VITALS — BP 137/79 | HR 65 | Temp 97.5°F | Resp 16 | Ht 63.0 in | Wt 240.0 lb

## 2014-02-12 DIAGNOSIS — G35 Multiple sclerosis: Secondary | ICD-10-CM | POA: Diagnosis present

## 2014-02-12 MED ORDER — LORATADINE 10 MG PO TABS
10.0000 mg | ORAL_TABLET | ORAL | Status: DC
  Administered 2014-02-12: 10 mg via ORAL
  Filled 2014-02-12: qty 1

## 2014-02-12 MED ORDER — SODIUM CHLORIDE 0.9 % IV SOLN
300.0000 mg | INTRAVENOUS | Status: DC
  Administered 2014-02-12: 300 mg via INTRAVENOUS
  Filled 2014-02-12: qty 15

## 2014-02-12 MED ORDER — ACETAMINOPHEN 500 MG PO TABS
1000.0000 mg | ORAL_TABLET | ORAL | Status: DC
  Administered 2014-02-12: 1000 mg via ORAL
  Filled 2014-02-12: qty 2

## 2014-02-12 MED ORDER — SODIUM CHLORIDE 0.9 % IV SOLN
INTRAVENOUS | Status: DC
  Administered 2014-02-12: 09:00:00 via INTRAVENOUS

## 2014-02-12 MED ORDER — EPINEPHRINE 0.3 MG/0.3ML IJ SOAJ
0.3000 mg | INTRAMUSCULAR | Status: DC
  Filled 2014-02-12: qty 0.6

## 2014-02-12 MED ORDER — SODIUM CHLORIDE 0.9 % IV SOLN
500.0000 mg | INTRAVENOUS | Status: DC
  Filled 2014-02-12: qty 4

## 2014-03-03 ENCOUNTER — Other Ambulatory Visit: Payer: Self-pay | Admitting: Neurology

## 2014-03-03 NOTE — Telephone Encounter (Signed)
Patient is calling to get a written Rx for Ritalin. Please call patient when ready for pickup. Thank you.

## 2014-03-03 NOTE — Telephone Encounter (Signed)
Request entered, forwarded to provider for approval.  

## 2014-03-05 MED ORDER — METHYLPHENIDATE HCL 10 MG PO TABS: 10.0000 mg | ORAL_TABLET | Freq: Three times a day (TID) | ORAL | Status: DC

## 2014-03-10 ENCOUNTER — Other Ambulatory Visit: Payer: Self-pay | Admitting: Neurology

## 2014-03-10 NOTE — Telephone Encounter (Signed)
Rx was taken to the front desk on 03/08/14 and as of 03/10/14, the patient has not picked up the Rx.

## 2014-03-12 ENCOUNTER — Encounter (HOSPITAL_COMMUNITY): Payer: Self-pay

## 2014-03-12 ENCOUNTER — Encounter (HOSPITAL_COMMUNITY)
Admission: RE | Admit: 2014-03-12 | Discharge: 2014-03-12 | Disposition: A | Discharge status: Home / Self Care | Payer: Managed Care, Other (non HMO) | Source: Ambulatory Visit | Attending: Neurology | Admitting: Neurology

## 2014-03-12 VITALS — BP 147/82 | HR 88 | Temp 97.5°F | Resp 16 | Ht 63.0 in | Wt 240.0 lb

## 2014-03-12 DIAGNOSIS — G35 Multiple sclerosis: Secondary | ICD-10-CM | POA: Insufficient documentation

## 2014-03-12 MED ORDER — LORATADINE 10 MG PO TABS
10.0000 mg | ORAL_TABLET | ORAL | Status: DC
  Administered 2014-03-12: 10 mg via ORAL
  Filled 2014-03-12: qty 1

## 2014-03-12 MED ORDER — ACETAMINOPHEN 500 MG PO TABS
1000.0000 mg | ORAL_TABLET | ORAL | Status: DC
  Administered 2014-03-12: 1000 mg via ORAL
  Filled 2014-03-12: qty 2

## 2014-03-12 MED ORDER — SODIUM CHLORIDE 0.9 % IV SOLN
INTRAVENOUS | Status: DC
  Administered 2014-03-12: 250 mL via INTRAVENOUS

## 2014-03-12 MED ORDER — SODIUM CHLORIDE 0.9 % IV SOLN
300.0000 mg | INTRAVENOUS | Status: DC
  Administered 2014-03-12: 300 mg via INTRAVENOUS
  Filled 2014-03-12: qty 15

## 2014-03-12 NOTE — Progress Notes (Signed)
Uneventful infusion of Tysabri. Pt declined to stay the suggested 1 hour post infusion observation. She stayed 20 minutes and states she will contact her dr for any questions or concerns

## 2014-03-15 LAB — HM COLONOSCOPY

## 2014-04-09 ENCOUNTER — Encounter (HOSPITAL_COMMUNITY): Payer: Self-pay

## 2014-04-09 ENCOUNTER — Encounter (HOSPITAL_COMMUNITY)
Admission: RE | Admit: 2014-04-09 | Discharge: 2014-04-09 | Disposition: A | Discharge status: Home / Self Care | Payer: Managed Care, Other (non HMO) | Source: Ambulatory Visit | Attending: Neurology | Admitting: Neurology

## 2014-04-09 VITALS — BP 125/53 | HR 66 | Temp 98.3°F | Resp 16 | Ht 63.0 in | Wt 240.0 lb

## 2014-04-09 DIAGNOSIS — G35 Multiple sclerosis: Secondary | ICD-10-CM

## 2014-04-09 MED ORDER — SODIUM CHLORIDE 0.9 % IV SOLN
300.0000 mg | INTRAVENOUS | Status: AC
  Administered 2014-04-09: 300 mg via INTRAVENOUS
  Filled 2014-04-09: qty 15

## 2014-04-09 MED ORDER — LORATADINE 10 MG PO TABS
10.0000 mg | ORAL_TABLET | ORAL | Status: AC
  Administered 2014-04-09: 10 mg via ORAL
  Filled 2014-04-09: qty 1

## 2014-04-09 MED ORDER — SODIUM CHLORIDE 0.9 % IV SOLN
INTRAVENOUS | Status: AC
  Administered 2014-04-09: 10:00:00 via INTRAVENOUS

## 2014-04-09 MED ORDER — ACETAMINOPHEN 500 MG PO TABS
1000.0000 mg | ORAL_TABLET | ORAL | Status: AC
  Administered 2014-04-09: 1000 mg via ORAL
  Filled 2014-04-09: qty 2

## 2014-04-09 NOTE — Progress Notes (Signed)
Patient tolerated Tysabri treatment w/o immediate reaction. Will not stay recommended post 1 Hour observation.

## 2014-04-12 ENCOUNTER — Telehealth: Payer: Self-pay | Admitting: *Deleted

## 2014-04-12 DIAGNOSIS — Z0289 Encounter for other administrative examinations: Secondary | ICD-10-CM

## 2014-04-12 NOTE — Telephone Encounter (Signed)
Form on [facility] desk.

## 2014-04-22 ENCOUNTER — Telehealth: Payer: Self-pay | Admitting: Nurse Practitioner

## 2014-04-22 DIAGNOSIS — G35D Multiple sclerosis, unspecified: Secondary | ICD-10-CM

## 2014-04-22 DIAGNOSIS — Z5181 Encounter for therapeutic drug level monitoring: Secondary | ICD-10-CM

## 2014-04-22 DIAGNOSIS — G35 Multiple sclerosis: Secondary | ICD-10-CM

## 2014-04-22 NOTE — Telephone Encounter (Signed)
Called patient. Gave instructions and lab hours. Patient verbalized understanding.

## 2014-04-22 NOTE — Telephone Encounter (Signed)
Please let patient know it is time to check her JC antibody status. Order in the system. thanks

## 2014-04-26 ENCOUNTER — Telehealth: Payer: Self-pay | Admitting: *Deleted

## 2014-04-26 ENCOUNTER — Encounter: Payer: Self-pay | Admitting: Nurse Practitioner

## 2014-04-26 NOTE — Telephone Encounter (Signed)
Patient form at front desk 04/26/14.

## 2014-04-27 ENCOUNTER — Telehealth: Payer: Self-pay | Admitting: Nurse Practitioner

## 2014-04-27 MED ORDER — METHYLPHENIDATE HCL 10 MG PO TABS: 10.0000 mg | ORAL_TABLET | Freq: Three times a day (TID) | ORAL | Status: DC

## 2014-04-27 NOTE — Telephone Encounter (Signed)
Called and spoke to patient and she will pick up her rx and her form at the front desk.

## 2014-04-27 NOTE — Telephone Encounter (Signed)
Kaylee Bryant has RX for patient and she has been called to pick up. RX at the [facility]. 04-27-14.

## 2014-04-27 NOTE — Telephone Encounter (Signed)
Hinton Dyer I have her Ritalin refill.I have not received FMLA papers to fill out on her. She is a Futures trader patient. Will you check please. Tell the pt RX ready for pickup.

## 2014-04-29 ENCOUNTER — Ambulatory Visit (INDEPENDENT_AMBULATORY_CARE_PROVIDER_SITE_OTHER): Payer: Self-pay

## 2014-04-29 DIAGNOSIS — Z0289 Encounter for other administrative examinations: Secondary | ICD-10-CM

## 2014-05-07 ENCOUNTER — Encounter (HOSPITAL_COMMUNITY): Payer: Self-pay

## 2014-05-07 ENCOUNTER — Encounter (HOSPITAL_COMMUNITY)
Admission: RE | Admit: 2014-05-07 | Discharge: 2014-05-07 | Disposition: A | Discharge status: Home / Self Care | Payer: Managed Care, Other (non HMO) | Source: Ambulatory Visit | Attending: Neurology | Admitting: Neurology

## 2014-05-07 VITALS — BP 139/76 | HR 67 | Temp 97.9°F | Resp 16 | Ht 63.0 in | Wt 240.0 lb

## 2014-05-07 DIAGNOSIS — G35 Multiple sclerosis: Secondary | ICD-10-CM | POA: Diagnosis not present

## 2014-05-07 MED ORDER — SODIUM CHLORIDE 0.9 % IV SOLN
300.0000 mg | INTRAVENOUS | Status: AC
  Administered 2014-05-07: 300 mg via INTRAVENOUS
  Filled 2014-05-07: qty 15

## 2014-05-07 MED ORDER — ACETAMINOPHEN 500 MG PO TABS
1000.0000 mg | ORAL_TABLET | ORAL | Status: AC
  Administered 2014-05-07: 1000 mg via ORAL
  Filled 2014-05-07: qty 2

## 2014-05-07 MED ORDER — LORATADINE 10 MG PO TABS
10.0000 mg | ORAL_TABLET | ORAL | Status: AC
  Administered 2014-05-07: 10 mg via ORAL
  Filled 2014-05-07: qty 1

## 2014-05-07 MED ORDER — SODIUM CHLORIDE 0.9 % IV SOLN
INTRAVENOUS | Status: AC
  Administered 2014-05-07: 10:00:00 via INTRAVENOUS

## 2014-05-07 NOTE — Progress Notes (Signed)
Refused to stay additional observation hour. Infusion complete w/o problems

## 2014-06-02 ENCOUNTER — Other Ambulatory Visit: Payer: Self-pay | Admitting: Neurology

## 2014-06-02 DIAGNOSIS — G35 Multiple sclerosis: Secondary | ICD-10-CM

## 2014-06-04 ENCOUNTER — Encounter (HOSPITAL_COMMUNITY): Payer: Self-pay

## 2014-06-04 ENCOUNTER — Encounter (HOSPITAL_COMMUNITY)
Admission: RE | Admit: 2014-06-04 | Discharge: 2014-06-04 | Disposition: A | Discharge status: Home / Self Care | Payer: Managed Care, Other (non HMO) | Source: Ambulatory Visit | Attending: Neurology | Admitting: Neurology

## 2014-06-04 VITALS — BP 125/69 | HR 76 | Temp 97.9°F | Resp 16

## 2014-06-04 DIAGNOSIS — G35 Multiple sclerosis: Secondary | ICD-10-CM | POA: Diagnosis not present

## 2014-06-04 MED ORDER — SODIUM CHLORIDE 0.9 % IV SOLN
300.0000 mg | INTRAVENOUS | Status: DC
  Administered 2014-06-04: 300 mg via INTRAVENOUS
  Filled 2014-06-04: qty 15

## 2014-06-04 MED ORDER — SODIUM CHLORIDE 0.9 % IV SOLN
INTRAVENOUS | Status: DC
  Administered 2014-06-04: 11:00:00 via INTRAVENOUS

## 2014-06-04 MED ORDER — LORATADINE 10 MG PO TABS
10.0000 mg | ORAL_TABLET | ORAL | Status: DC
  Administered 2014-06-04: 10 mg via ORAL
  Filled 2014-06-04: qty 1

## 2014-06-04 MED ORDER — ACETAMINOPHEN 500 MG PO TABS
1000.0000 mg | ORAL_TABLET | ORAL | Status: DC
  Administered 2014-06-04: 1000 mg via ORAL
  Filled 2014-06-04: qty 2

## 2014-06-11 ENCOUNTER — Telehealth: Payer: Self-pay | Admitting: Neurology

## 2014-06-11 NOTE — Telephone Encounter (Signed)
I spoke to the patient about the [employer] trial. Patient is interested. I will mail the Informed Consent Form (ICF) and will check with the patient within a week,

## 2014-06-18 ENCOUNTER — Ambulatory Visit: Payer: Managed Care, Other (non HMO) | Admitting: Neurology

## 2014-06-28 ENCOUNTER — Ambulatory Visit (INDEPENDENT_AMBULATORY_CARE_PROVIDER_SITE_OTHER): Payer: Managed Care, Other (non HMO) | Admitting: Neurology

## 2014-06-28 ENCOUNTER — Encounter: Payer: Self-pay | Admitting: Neurology

## 2014-06-28 VITALS — BP 155/78 | HR 69 | Ht 63.0 in | Wt 241.0 lb

## 2014-06-28 DIAGNOSIS — M545 Low back pain, unspecified: Secondary | ICD-10-CM

## 2014-06-28 DIAGNOSIS — G35 Multiple sclerosis: Secondary | ICD-10-CM

## 2014-06-28 MED ORDER — METHYLPHENIDATE HCL 10 MG PO TABS: 10.0000 mg | ORAL_TABLET | Freq: Three times a day (TID) | ORAL | Status: DC

## 2014-06-28 NOTE — Progress Notes (Signed)
[facility]  PATIENT: Kaylee Bryant DOB: [birth year]   REASON FOR VISIT:follow up for MS  HISTORY OF PRESENT ILLNESS: HISTORY: She was diagnosed with RRMS in 2007, 6 month post partum, she had numbness from waist done, diagnosis was confirmed by marked abnormal MRI brain, in March,2007. There were multiple T2/Flair periventricular lesions, corpus callosum involvement, mutiple ring enhancing lesions. Also had enhancing lesion in cervical and thoracic spines. She was treated at [facility] by Dr. Dellis Filbert, no csf study, but had extensive lab evaluations to rule out mimics. 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 She denies visual changes or other clear relapsing episodes.   I have reviewed report MRI brain by  Dr. Brandon Melnick in 01/2007, multiple T2/FLAIR lesions in both hemisphere with corpus callosum involvement, there are slight increase in lesion load. MRI cervical, stable lesions in C6-7, and C2-3. Thoracic, multiple non enhancing lesions.   She tolerated Rebif well, no flu like illness, but complains of bilateral arm, and the lateral thigh lipoatrophy with repeat Rebif injection. There was no clear relapsed over the years, but she has slow worsening gait difficulty over the past 6 months.  MRI in Sept 2012, MRI of thoracic spine Multiple chronic plaques at C7, T6-7, T8-9, T10.  MRI of cervical spine ,Multiple chronic demyelinating plaques at C3, C4 and C7. MRI of brain Multiple round and ovioid, periventricular and subcortical chronic demyelinating plaques. 2 plaques noted in the pons. No abnormal lesions are seen on post contrast views.   Her JC-virus antibody was negative. After discuss, we started her on Tysarbri infusion since February of 2013. She  tolerated the infusion very well, but complains of excessive fatigue over the past few months, she still able to work full-time as a Marine scientist, she complains of difficulty dragging herself out of bed. Modafinil was not approved by insurance.She was put on Ritalin 79m tid, which helped her fatigue. feels much better on Tysabri.  JC-virus antibody negative (July 2014), Negative 0.17 in 11/11/2013,  UPDATE December 16 2013:  She is doing very well, her gait has improved, tolerating [place] Dee Ritalin 10 mg tid. for her fatigue.JC-virus antibody negative 11/11/2013 .  Most recent MRI of the brain 10/21/2013 with multiple supra tentorial and infra-tentorial chronic demyelinating plaques. No acute plaques. No change from MRI 05/22/2013. 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 has more dizziness spells, especially when she has to stand up, she has occasional headaches, She complains of intermittent sharp pain at her right lower thoracic region, X-ray showed DJD, excerbated by walking,   Orthopedic evaluation was normal, including bilateral hip,  She is on Tysarbri,taking baclofen 111mtid, works well for her lower extremity She has no incotninence, if she stopped,   REVIEW OF SYSTEMS: Full 14 system review of systems performed and notable only for those listed, all others are neg:      ALLERGIES: No Known Allergies  HOME MEDICATIONS: Outpatient Prescriptions Prior to Visit  Medication Sig Dispense Refill  . baclofen (LIORESAL) 10 MG tablet TAKE ONE TABLET BY MOUTH THREE TIMES DAILY 90 tablet 5  . gabapentin (NEURONTIN) 300 MG capsule Take 1 capsule (300 mg total) by mouth 3 (three) times daily. 90 capsule 11  . ibuprofen (ADVIL,MOTRIN) 200  MG tablet Take 800 mg by mouth at bedtime as needed.    . methylphenidate (RITALIN) 10 MG tablet Take 1 tablet (10 mg total) by mouth 3 (three) times daily with meals. Do not take last dose any later than 4pm 90 tablet 0  .  natalizumab (TYSABRI) 300 MG/15ML injection Inject into the vein.    . Probiotic Product (ULTRAFLORA IMMUNE HEALTH PO) Take by mouth 1 day or 1 dose.    . baclofen (LIORESAL) 10 MG tablet TAKE ONE TABLET BY MOUTH THREE TIMES DAILY 90 tablet 6  . Eflornithine HCl (VANIQA) 13.9 % cream Apply 1 application topically 2 (two) times daily with a meal. 30 g 1  . Linaclotide (LINZESS) 145 MCG CAPS capsule Take 145 mcg by mouth as needed.    . medroxyPROGESTERone (PROVERA) 10 MG tablet Take 1 tablet (10 mg total) by mouth daily. 10 tablet 0   No facility-administered medications prior to visit.    PAST MEDICAL HISTORY: Past Medical History  Diagnosis Date  . Multiple sclerosis   . Fatigue   . Abnormal Pap smear 2004    HPV, cone biopsy  . DM (diabetes mellitus), gestational 2006    insulin at the time  . IBS (irritable bowel syndrome)     with chronic constipation  . DDD (degenerative disc disease), lumbar     PAST SURGICAL HISTORY: Past Surgical History  Procedure Laterality Date  . Cholecystectomy      1988  . Cesarean section      1996, 2006  . Colposcopy  2004  . Cervical cone biopsy  2004    FAMILY HISTORY: Family History  Problem Relation Age of Onset  . Diabetes Mother   . Hypertension Mother   . Hypertension Father   . Atrial fibrillation Father   . Diabetes Sister   . Breast cancer Maternal Aunt     SOCIAL HISTORY: History   Social History  . Marital Status: Married    Spouse Name: N/A  . Number of Children: 4  . Years of Education: college   Occupational History  . RN    Social History Main Topics  . Smoking status: Former Smoker -- 1.50 packs/day for 15 years    Types: Cigarettes    Quit date: 07/04/2003  . Smokeless tobacco: Never Used  . Alcohol Use: 0.6 oz/week    1 Glasses of wine per week  . Drug Use: No  . Sexual Activity:    Partners: Male    Birth Control/ Protection: Surgical     Comment: vasectomy   Other Topics Concern  . Not on  file   Social History Narrative   Patient lives at home with her husband and children. Patient works for the [employer] in [place]. College education.      PHYSICAL EXAM  Filed Vitals:   06/28/14 1121  BP: 155/78  Pulse: 69  Height: 5' 3"  (1.6 m)  Weight: 241 lb (109.317 kg)   Body mass index is 42.7 kg/(m^2).  PHYSICAL EXAMNIATION:  Gen: NAD, conversant, well nourised, obese, well groomed                     Cardiovascular: Regular rate rhythm, no peripheral edema, warm, nontender. Eyes: Conjunctivae clear without exudates or hemorrhage Neck: Supple, no carotid bruise. Pulmonary: Clear to auscultation bilaterally   NEUROLOGICAL EXAM:  MENTAL STATUS: Speech:    Speech is normal; fluent and spontaneous with normal comprehension.  Cognition:    The  patient is oriented to person, place, and time;     recent and remote memory intact;     language fluent;     normal attention, concentration,     fund of knowledge.  CRANIAL NERVES: CN II: Visual fields are full to confrontation. Fundoscopic exam is normal with sharp discs and no vascular changes. Venous pulsations are present bilaterally. Pupils are 4 mm and briskly reactive to light. Visual acuity is 20/20 bilaterally. CN III, IV, VI: extraocular movement are normal. No ptosis. CN V: Facial sensation is intact to pinprick in all 3 divisions bilaterally. Corneal responses are intact.  CN VII: Face is symmetric with normal eye closure and smile. CN VIII: Hearing is normal to rubbing fingers CN IX, X: Palate elevates symmetrically. Phonation is normal. CN XI: Head turning and shoulder shrug are intact CN XII: Tongue is midline with normal movements and no atrophy.  MOTOR: There is no pronator drift of out-stretched arms. Mild bilateral lower extremity spasticity, no significant weakness   Shoulder abduction Shoulder external rotation Elbow flexion Elbow extension Wrist flexion Wrist extension Finger abduction Hip flexion  Knee flexion Knee extension Ankle dorsi flexion Ankle plantar flexion  R 5 5 5 5 5 5 5 5 5 5 5 5   L 5 5 5 5 5 5 5 5 5 5 5 5     REFLEXES: Reflexes are 2+ and symmetric at the biceps, triceps, knees, and ankles. Plantar responses are flexor.  SENSORY: Light touch, pinprick, position sense, and vibration sense are intact in fingers and toes.  COORDINATION: Rapid alternating movements and fine finger movements are intact. There is no dysmetria on finger-to-nose and heel-knee-shin. There are no abnormal or extraneous movements.   GAIT/STANCE: Posture is normal. Gait is steady with normal steps, base, arm swing, and turning. Heel and toe walking are normal. Tandem gait is normal.  Romberg is absent.     DIAGNOSTIC DATA (LABS, IMAGING, TESTING) - I reviewed patient records, labs, notes, testing and imaging myself where available.  Lab Results  Component Value Date   WBC 10.1 06/02/2013   HGB 13.4 06/02/2013   HCT 38.8 06/02/2013   MCV 85.7 06/02/2013   PLT 334 06/02/2013      Component Value Date/Time   NA 141 06/02/2013 1640   K 4.1 06/02/2013 1640   CL 103 06/02/2013 1640   CO2 30 06/02/2013 1640   GLUCOSE 104* 06/02/2013 1640   BUN 6 06/02/2013 1640   CREATININE 0.65 06/02/2013 1640   CREATININE 0.53 07/02/2010 1516   CALCIUM 8.9 06/02/2013 1640   PROT 7.1 06/02/2013 1640   ALBUMIN 4.5 06/02/2013 1640   AST 20 06/02/2013 1640   ALT 22 06/02/2013 1640   ALKPHOS 44 06/02/2013 1640   BILITOT 0.5 06/02/2013 1640   GFRNONAA >60 07/02/2010 1516   GFRAA  07/02/2010 1516    >60        The eGFR has been calculated using the MDRD equation. This calculation has not been validated in all clinical situations. eGFR's persistently <60 mL/min signify possible Chronic Kidney Disease.    ASSESSMENT AND PLAN  49 y.o. year old female  has a past medical history of Multiple sclerosis; Fatigue; here to followup.JC-virus antibody negative 11/11/2013 . Most recent MRI of the brain  10/22/2011 with multiple supra tentorial and infra-tentorial chronic demyelinating plaques. No acute plaques. No change from MRI 05/22/2013.  Continue Ritalin 10 mg 3 times daily RX given to patient Continues Tysabri monthly, Lab today, MRI brain w/wo  She has been taken Baclofen 86m tid, agrees on [employer] trial    YMarcial Pacas M.D. Ph.D.  [facility] [address] [place] [address]Phone: [phone]:      [phone]

## 2014-06-29 ENCOUNTER — Telehealth: Payer: Self-pay | Admitting: Neurology

## 2014-06-29 LAB — COMPREHENSIVE METABOLIC PANEL
ALT: 24 IU/L (ref 0–32)
AST: 18 IU/L (ref 0–40)
Albumin/Globulin Ratio: 1.7 (ref 1.1–2.5)
Albumin: 4.4 g/dL (ref 3.5–5.5)
Alkaline Phosphatase: 59 IU/L (ref 39–117)
BUN/Creatinine Ratio: 14 (ref 9–23)
BUN: 9 mg/dL (ref 6–24)
Bilirubin Total: 0.4 mg/dL (ref 0.0–1.2)
CO2: 25 mmol/L (ref 18–29)
Calcium: 9.5 mg/dL (ref 8.7–10.2)
Chloride: 101 mmol/L (ref 97–108)
Creatinine, Ser: 0.66 mg/dL (ref 0.57–1.00)
GFR calc Af Amer: 121 mL/min/{1.73_m2} (ref >59)
GFR calc non Af Amer: 105 mL/min/{1.73_m2} (ref >59)
Globulin, Total: 2.6 g/dL (ref 1.5–4.5)
Glucose: 150 mg/dL — ABNORMAL HIGH (ref 65–99)
Potassium: 4.2 mmol/L (ref 3.5–5.2)
Sodium: 142 mmol/L (ref 134–144)
Total Protein: 7 g/dL (ref 6.0–8.5)

## 2014-06-29 LAB — CBC WITH DIFFERENTIAL
Basophils Absolute: 0.1 10*3/uL (ref 0.0–0.2)
Basos: 1 %
Eos: 3 %
Eosinophils Absolute: 0.2 10*3/uL (ref 0.0–0.4)
HCT: 42.3 % (ref 34.0–46.6)
Hemoglobin: 14.1 g/dL (ref 11.1–15.9)
Immature Grans (Abs): 0 10*3/uL (ref 0.0–0.1)
Immature Granulocytes: 0 %
Lymphocytes Absolute: 4.1 10*3/uL — ABNORMAL HIGH (ref 0.7–3.1)
Lymphs: 50 %
MCH: 29.6 pg (ref 26.6–33.0)
MCHC: 33.3 g/dL (ref 31.5–35.7)
MCV: 89 fL (ref 79–97)
Monocytes Absolute: 0.6 10*3/uL (ref 0.1–0.9)
Monocytes: 7 %
Neutrophils Absolute: 3.2 10*3/uL (ref 1.4–7.0)
Neutrophils Relative %: 39 %
RBC: 4.76 x10E6/uL (ref 3.77–5.28)
RDW: 14.4 % (ref 12.3–15.4)
WBC: 8.3 10*3/uL (ref 3.4–10.8)

## 2014-06-29 NOTE — Telephone Encounter (Signed)
Left message on Kaylee Bryant's personal voicemail letting her know results.

## 2014-06-29 NOTE — Telephone Encounter (Signed)
Michelle: Please call patient, normal CMP with exception of elevated glucose 150, this could due to timing of the sample, normal CBC

## 2014-07-02 ENCOUNTER — Encounter (HOSPITAL_COMMUNITY): Payer: Managed Care, Other (non HMO)

## 2014-07-05 ENCOUNTER — Ambulatory Visit
Admission: RE | Admit: 2014-07-05 | Discharge: 2014-07-05 | Disposition: A | Discharge status: Home / Self Care | Payer: Managed Care, Other (non HMO) | Source: Ambulatory Visit | Attending: Neurology | Admitting: Neurology

## 2014-07-05 DIAGNOSIS — G35 Multiple sclerosis: Secondary | ICD-10-CM | POA: Diagnosis not present

## 2014-07-05 DIAGNOSIS — M545 Low back pain, unspecified: Secondary | ICD-10-CM

## 2014-07-05 MED ORDER — GADOBENATE DIMEGLUMINE 529 MG/ML IV SOLN
20.0000 mL | Freq: Once | INTRAVENOUS | Status: AC | PRN
  Administered 2014-07-05: 20 mL via INTRAVENOUS

## 2014-07-06 ENCOUNTER — Telehealth: Payer: Self-pay | Admitting: Neurology

## 2014-07-06 NOTE — Telephone Encounter (Signed)
Patient aware of results.

## 2014-07-06 NOTE — Telephone Encounter (Signed)
Michelle: Please call patient, repeat MRI showed stable MS lesions, no significant change compared to previous scans in July 2015   Abnormal MRI brain (with and without) demonstrating: 1. Scattered round and ovoid, periventricular, subcortical, juxtacortical, midbrain, pontine chronic demyelinating plaques. Some of these are hypointense on T1 views. 2. No abnormal lesions are seen on post contrast views.  3. No significant change from MRI on 10/21/13.

## 2014-07-07 ENCOUNTER — Ambulatory Visit (INDEPENDENT_AMBULATORY_CARE_PROVIDER_SITE_OTHER): Payer: Self-pay | Admitting: Neurology

## 2014-07-07 VITALS — BP 160/92 | HR 64 | Temp 97.6°F | Resp 12

## 2014-07-07 DIAGNOSIS — G35 Multiple sclerosis: Secondary | ICD-10-CM

## 2014-07-07 DIAGNOSIS — R269 Unspecified abnormalities of gait and mobility: Secondary | ICD-10-CM

## 2014-07-07 NOTE — Progress Notes (Signed)
Kaylee Bryant was seen today for Screening Visit for the [employer] CLR_09_21 Research Trial (A Placebo-Controlled Randomized Withdrawal Evaluation of the Efficacy and Safety of Baclofen ER Capsules (GRS) In Subjects with Spasticity due to Multiple Sclerosis).  Patient was provided ample time to read, review, and understand the Informed Consent Form (ICF). The patient was also given time to ask questions and have those addressed. Then, the ICF (Version Date: [id number]) was signed at 13:15h on [id number]. Ashworth assessment was performed by [place].  Principal Investigator, Dr. Marcial Pacas, reviewed with the patient the following:     MS History     Spasticity History     MS symptoms other than spasticity      Baclofen History (Baclofen IR 10mg  tid since 2013)     Medical History  Moreover, Dr. Krista Blue performed a complete physical and neurological examinations at 14:20h. Vital signs were non-clinically-significant. Clinical Laboratory tests and Pharmacokinetic Blood sampling were collected at 14:50h. C-SSRS was performed by [place] and exhibited no significant findings. An EKG was performed during the visit.  Dr. Krista Blue reviewed inclusion and exclusion criteria and concluded that the subject qualifies to continue in the study. Finally, patient's Visit 1 (Week 1) was scheduled for Thursday, 21APR2016.

## 2014-07-07 NOTE — Progress Notes (Signed)
  Reviewed, patient is here for [employer] baclofen 9-21 screen visit

## 2014-07-13 ENCOUNTER — Telehealth: Payer: Self-pay | Admitting: *Deleted

## 2014-07-13 NOTE — Telephone Encounter (Signed)
Collected 07/07/14 - JCV 0.19 negative

## 2014-07-14 ENCOUNTER — Telehealth: Payer: Self-pay | Admitting: Neurology

## 2014-07-14 NOTE — Telephone Encounter (Signed)
I spoke to the patient to confirm her appointment on Thursday, 21APR2016 at 15:00h.

## 2014-07-15 ENCOUNTER — Ambulatory Visit (INDEPENDENT_AMBULATORY_CARE_PROVIDER_SITE_OTHER): Payer: Managed Care, Other (non HMO) | Admitting: Neurology

## 2014-07-15 ENCOUNTER — Encounter: Payer: Self-pay | Admitting: Neurology

## 2014-07-15 VITALS — BP 132/84 | HR 64 | Temp 97.7°F | Resp 12

## 2014-07-15 DIAGNOSIS — G35 Multiple sclerosis: Secondary | ICD-10-CM

## 2014-07-15 DIAGNOSIS — R269 Unspecified abnormalities of gait and mobility: Secondary | ICD-10-CM

## 2014-07-15 NOTE — Progress Notes (Signed)
Kaylee Bryant (DOB: [id number]) was seen today for Visit 1 (Week 1) for the [employer] CLR_09_21 Research Trial (A Placebo-Controlled Randomized Withdrawal Evaluation of the Efficacy and Safety of Baclofen ER Capsules (GRS) In Subjects with Spasticity due to Multiple Sclerosis).  Since her last research visit on [id number] (Screening Visit), patient stated that there were not any adverse events or changes in her medications. Vital signs were non-clinically-significant. Expanded Disability Status Scale (EDSS) was performed by Dr. Krista Blue (EDSS score: 3.0). C-SSRS was performed by [place] with no significant findings. Patient was dispensed Baclofen IR 10 mg t.i.d. (kit number: [id number]). Subject Diary was dispensed. Diary and dosing instructions were reviewed with the patient.   Patient was reminded to stay on stable caffeine and nicotine consumption and minimize alcohol consumption if applicable. Ashworth Assessment was performed by Ashworth Assessor Rizwan Sabir. Finally, patient's Visit 2 (Week 2) was scheduled for Friday, 29APR2016.

## 2014-07-16 NOTE — Progress Notes (Signed)
Reviewed above agree with plan

## 2014-07-23 ENCOUNTER — Telehealth: Payer: Self-pay | Admitting: Neurology

## 2014-07-23 ENCOUNTER — Ambulatory Visit (INDEPENDENT_AMBULATORY_CARE_PROVIDER_SITE_OTHER): Payer: Self-pay | Admitting: Neurology

## 2014-07-23 VITALS — BP 138/94 | HR 88 | Temp 97.8°F | Resp 16

## 2014-07-23 DIAGNOSIS — R269 Unspecified abnormalities of gait and mobility: Secondary | ICD-10-CM

## 2014-07-23 DIAGNOSIS — G35 Multiple sclerosis: Secondary | ICD-10-CM

## 2014-07-23 NOTE — Telephone Encounter (Signed)
Provided number to [place] in [facility].

## 2014-07-23 NOTE — Progress Notes (Signed)
Kaylee Bryant (DOB: [id number]) was seen today, [phone] for Visit 2 (Week 2) for the [employer] CLR_09_21 Research Trial (A Placebo-Controlled Randomized Withdrawal Evaluation of the Efficacy and Safety of Baclofen ER Capsules (GRS) In Subjects with Spasticity due to Multiple Sclerosis). Patient was accompanied by her daughter.  Since her last research visit on 21APR2016 for Visit 1 (Week 1), patient stated that there were not any adverse events or changes in her medications. Vital signs were non-clinically-significant. A 12 lead ECG was also performed in triplicate at two minute intervals: 14:23h, 14:25h, and 14:27h. C-SSRS was performed by [facility] with no significant findings. Patient returned 1 bottle of Baclofen IR and was found to be compliant. Patient was dispensed Baclofen ER (GRS) (kit number: [id number]). Diary pages were collected. Diary and dosing instructions were reviewed with the patient.   Patient was reminded to stay on stable caffeine and nicotine consumption and minimize alcohol consumption if applicable. Ashworth Assessment was performed by Ashworth Assessor Rizwan Sabir. Finally, patient's Visit 3 (Week 4) was scheduled for Friday, 12MAY2016.

## 2014-07-23 NOTE — Telephone Encounter (Signed)
Jiles Garter with [facility] is calling to speak to someone about delivery of Tysabri for the patient.

## 2014-07-27 ENCOUNTER — Encounter: Payer: Self-pay | Admitting: Neurology

## 2014-07-28 NOTE — Progress Notes (Signed)
I have reviewed and agreed above plan. 

## 2014-07-30 ENCOUNTER — Encounter (HOSPITAL_COMMUNITY): Payer: Managed Care, Other (non HMO)

## 2014-08-04 ENCOUNTER — Other Ambulatory Visit: Payer: Self-pay | Admitting: *Deleted

## 2014-08-04 MED ORDER — NATALIZUMAB 300 MG/15ML IV CONC: 300.0000 mg | INTRAVENOUS | Status: DC

## 2014-08-05 ENCOUNTER — Encounter: Payer: Managed Care, Other (non HMO) | Admitting: Neurology

## 2014-08-11 ENCOUNTER — Encounter (INDEPENDENT_AMBULATORY_CARE_PROVIDER_SITE_OTHER): Payer: Self-pay | Admitting: Neurology

## 2014-08-11 DIAGNOSIS — Z0289 Encounter for other administrative examinations: Secondary | ICD-10-CM

## 2014-08-27 ENCOUNTER — Encounter (HOSPITAL_COMMUNITY): Payer: Managed Care, Other (non HMO)

## 2014-09-14 ENCOUNTER — Other Ambulatory Visit: Payer: Self-pay | Admitting: Neurology

## 2014-09-14 ENCOUNTER — Other Ambulatory Visit: Payer: Self-pay

## 2014-09-14 ENCOUNTER — Telehealth: Payer: Self-pay | Admitting: *Deleted

## 2014-09-14 MED ORDER — METHYLPHENIDATE HCL 10 MG PO TABS: 10.0000 mg | ORAL_TABLET | Freq: Three times a day (TID) | ORAL | Status: DC

## 2014-09-14 NOTE — Telephone Encounter (Signed)
Methylphenidate rx signed and placed at front for pick up.

## 2014-09-24 ENCOUNTER — Encounter (HOSPITAL_COMMUNITY): Payer: Managed Care, Other (non HMO)

## 2014-10-07 ENCOUNTER — Ambulatory Visit: Payer: Managed Care, Other (non HMO) | Admitting: Neurology

## 2014-10-19 ENCOUNTER — Encounter: Payer: Self-pay | Admitting: Neurology

## 2014-10-19 ENCOUNTER — Ambulatory Visit (INDEPENDENT_AMBULATORY_CARE_PROVIDER_SITE_OTHER): Payer: Managed Care, Other (non HMO) | Admitting: Neurology

## 2014-10-19 VITALS — BP 128/74 | HR 63 | Ht 63.0 in | Wt 235.0 lb

## 2014-10-19 DIAGNOSIS — R269 Unspecified abnormalities of gait and mobility: Secondary | ICD-10-CM | POA: Diagnosis not present

## 2014-10-19 DIAGNOSIS — G35D Multiple sclerosis, unspecified: Secondary | ICD-10-CM

## 2014-10-19 DIAGNOSIS — M545 Low back pain, unspecified: Secondary | ICD-10-CM

## 2014-10-19 DIAGNOSIS — G35 Multiple sclerosis: Secondary | ICD-10-CM | POA: Diagnosis not present

## 2014-10-19 MED ORDER — CELECOXIB 100 MG PO CAPS: 100.0000 mg | ORAL_CAPSULE | Freq: Two times a day (BID) | ORAL | Status: DC

## 2014-10-19 NOTE — Progress Notes (Signed)
Chief Complaint  Patient presents with  . Multiple Sclerosis    Feels she has been more fatigued.  She is concerned about low back pain that has been worsening.  Pain radiates into right hip and causes her more difficulty after prolonged sitting.       [facility]  PATIENT: Kaylee Bryant DOB: [birth year]   REASON FOR VISIT:follow up for MS  HISTORY OF PRESENT ILLNESS: HISTORY: She was diagnosed with RRMS in 2007, 6 month post partum, she had numbness from waist done, diagnosis was confirmed by marked abnormal MRI brain, in March,2007. There were multiple T2/Flair periventricular lesions, corpus callosum involvement, mutiple ring enhancing lesions. Also had enhancing lesion in cervical and thoracic spines. She was treated at [facility] by Dr. Dellis Filbert, no csf study, but had extensive lab evaluations to rule out mimics. 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 She denies visual changes or other clear relapsing episodes.    I have reviewed report MRI brain by  Dr. Brandon Melnick in 01/2007, multiple T2/FLAIR lesions in both hemisphere with corpus callosum involvement, there are slight increase in lesion load. MRI cervical, stable lesions in C6-7, and C2-3. Thoracic, multiple non enhancing lesions.   She tolerated Rebif well, no flu like illness, but complains of bilateral arm, and the lateral thigh lipoatrophy with repeat Rebif injection. There was no clear relapsed over the years, but she has slow worsening gait difficulty over the past 6 months.   MRI in Sept 2012, MRI of thoracic spine Multiple chronic plaques at C7, T6-7, T8-9, T10.  MRI of cervical spine ,Multiple chronic demyelinating plaques at C3, C4 and C7. MRI of brain Multiple round and ovioid,  periventricular and subcortical chronic demyelinating plaques. 2 plaques noted in the pons. No abnormal lesions are seen on post contrast views.   Her JC-virus antibody was negative. After discuss, we started her on Tysarbri infusion since February of 2013. She tolerated the infusion very well, but complains of excessive fatigue over the past few months, she still able to work full-time as a Marine scientist, she complains of difficulty dragging herself out of bed. Modafinil was not approved by insurance.She was put on Ritalin 10mg  tid, which helped her fatigue. feels much better on Tysabri.  JC-virus antibody negative (July 2014), Negative 0.17 in 11/11/2013,   UPDATE December 16 2013:  She is doing very well, her gait has improved, tolerating [name], Ritalin 10 mg tid. for her fatigue.JC-virus antibody negative 11/11/2013 .  Most recent MRI of the brain 10/21/2013 with multiple supra tentorial and infra-tentorial chronic demyelinating plaques. No acute plaques. No change from MRI 05/22/2013. She returns for reevaluation  UPDATE April 4th 2016: She works full time as Therapist, sports, s he has more dizziness spells, especially when she has to stand up, she has occasional headaches, She complains of intermittent sharp pain at her right lower thoracic region, X-ray showed DJD, excerbated by walking,   Orthopedic evaluation was normal, including bilateral hip,  She is on Tysarbri,taking baclofen 10mg  tid, works well for her lower extremity She has no incotninence, if she stopped,   UPDATE July 26th 2016: She is now complainng of right side low back pain, radiating to right hip, right hip muscle cramping, sometimes difficulty getting out of the bed, to put on her  cloth, 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 baclofen (LIORESAL) 10 MG tablet TAKE ONE TABLET BY MOUTH THREE TIMES DAILY 90 tablet 5  . gabapentin (NEURONTIN) 300 MG capsule Take 1 capsule (300 mg total) by mouth 3 (three) times daily. 90 capsule 11  . ibuprofen (ADVIL,MOTRIN) 200 MG tablet Take 800 mg by mouth at bedtime as needed.    . methylphenidate (RITALIN) 10 MG tablet Take 1 tablet (10 mg total) by mouth 3 (three) times daily with meals. Do not take last dose any later than 4pm 90 tablet 0  . natalizumab (TYSABRI) 300 MG/15ML injection Inject 15 mLs (300 mg total) into the vein every 30 (thirty) days. 15 mL 11  . Probiotic Product (ULTRAFLORA IMMUNE HEALTH PO) Take by mouth 1 day or 1 dose.     No facility-administered medications prior to visit.    PAST MEDICAL HISTORY: Past Medical History  Diagnosis Date  . Multiple sclerosis   . Fatigue   . Abnormal Pap smear 2004    HPV, cone biopsy  . DM (diabetes mellitus), gestational 2006    insulin at the time  . IBS (irritable bowel syndrome)     with chronic constipation  . DDD (degenerative disc disease), lumbar     PAST SURGICAL HISTORY: Past Surgical History  Procedure Laterality Date  . Cholecystectomy      1988  . Cesarean section      1996, 2006  . Colposcopy  2004  . Cervical cone biopsy  2004    FAMILY HISTORY: Family History  Problem Relation Age of Onset  . Diabetes Mother   . Hypertension Mother   . Hypertension Father   . Atrial fibrillation Father   . Diabetes Sister   . Breast cancer Maternal Aunt     SOCIAL HISTORY: History   Social History  . Marital Status: Married    Spouse Name: N/A  . Number of Children: 4  . Years of Education: college   Occupational History  . RN    Social History Main Topics  . Smoking status: Former Smoker --  1.50 packs/day for 15 years    Types: Cigarettes    Quit date: 07/04/2003  . Smokeless tobacco: Never Used  . Alcohol Use: 0.6 oz/week    1 Glasses of wine per week  . Drug Use: No  . Sexual Activity:    Partners: Male    Birth Control/ Protection: Surgical     Comment: vasectomy   Other Topics Concern  . Not on file   Social History Narrative   Patient lives at home with her husband and children. Patient works for the [employer] in [place]. College education.      PHYSICAL EXAM  Filed Vitals:   10/19/14 0825  BP: 128/74  Pulse: 63  Height: 5\' 3"  (1.6 m)  Weight: 235 lb (106.595 kg)   Body mass index is 41.64 kg/(m^2).  PHYSICAL EXAMNIATION:  Gen: NAD, conversant, well nourised, obese, well groomed  Cardiovascular: Regular rate rhythm, no peripheral edema, warm, nontender. Eyes: Conjunctivae clear without exudates or hemorrhage Neck: Supple, no carotid bruise. Pulmonary: Clear to auscultation bilaterally   NEUROLOGICAL EXAM:  MENTAL STATUS: Speech:    Speech is normal; fluent and spontaneous with normal comprehension.  Cognition:    The patient is oriented to person, place, and time;     recent and remote memory intact;     language fluent;     normal attention, concentration,     fund of knowledge.  CRANIAL NERVES: CN II: Visual fields are full to confrontation. Fundoscopic exam is normal with sharp discs and no vascular changes. Pupils are equal round reactive to light CN III, IV, VI: extraocular movement are normal. No ptosis. CN V: Facial sensation is intact to pinprick in all 3 divisions bilaterally. Corneal responses are intact.  CN VII: Face is symmetric with normal eye closure and smile. CN VIII: Hearing is normal to rubbing fingers CN IX, X: Palate elevates symmetrically. Phonation is normal. CN XI: Head turning and shoulder shrug are intact CN XII: Tongue is midline with normal movements and no atrophy.  MOTOR: She has mild  spasticity of bilateral lower extremity, mild bilateral hip flexion weakness,  REFLEXES: Reflexes are 2+ and symmetric at the biceps, triceps, knees, and ankles. Plantar responses are flexor.  SENSORY: Light touch, pinprick, position sense, and vibration sense are intact in fingers and toes.  COORDINATION: Rapid alternating movements and fine finger movements are intact. There is no dysmetria on finger-to-nose and heel-knee-shin. There are no abnormal or extraneous movements.   GAIT/STANCE: Wide-based, stiff, cautious gait    DIAGNOSTIC DATA (LABS, IMAGING, TESTING) - I reviewed patient records, labs, notes, testing and imaging myself where available.  Lab Results  Component Value Date   WBC 8.3 06/28/2014   HGB 14.1 06/28/2014   HCT 42.3 06/28/2014   MCV 89 06/28/2014   PLT 334 06/02/2013      Component Value Date/Time   NA 142 06/28/2014 1420   NA 141 06/02/2013 1640   K 4.2 06/28/2014 1420   CL 101 06/28/2014 1420   CO2 25 06/28/2014 1420   GLUCOSE 150* 06/28/2014 1420   GLUCOSE 104* 06/02/2013 1640   BUN 9 06/28/2014 1420   BUN 6 06/02/2013 1640   CREATININE 0.66 06/28/2014 1420   CREATININE 0.65 06/02/2013 1640   CALCIUM 9.5 06/28/2014 1420   PROT 7.0 06/28/2014 1420   PROT 7.1 06/02/2013 1640   ALBUMIN 4.5 06/02/2013 1640   AST 18 06/28/2014 1420   ALT 24 06/28/2014 1420   ALKPHOS 59 06/28/2014 1420   BILITOT 0.4 06/28/2014 1420   BILITOT 0.5 06/02/2013 1640   GFRNONAA 105 06/28/2014 1420   GFRAA 121 06/28/2014 1420   MRI brain April 2016: Scattered round and ovoid, periventricular, subcortical, juxtacortical, midbrain, pontine chronic demyelinating plaques. Some of these are hypointense on T1 views.  No abnormal lesions are seen on post contrast views.  No significant change from MRI on 10/21/13.  Started Tysabri since Feb 2013  JC virus negative: July 2014; 0.17 in 11/11/2013; 0.19 in April 2016.  ASSESSMENT AND PLAN  49 y.o. year old female   1,  relapsing remitting multiple sclerosis, continue with Tysarbri infusion JC virus negative,Repeat JC virus titer every 6 months, MRI of the brain every 6-12 months 2. fatigue, Ritalin 10 mg 3 times a day 3. New onset low back pain, right hip pain, most likely right SI joint etiology, versus right lumbar radiculopathy, I  have suggested Celebrex 100 mg twice a day as needed, hard compression, physical therapy   Marcial Pacas, M.D. Ph.D.  [facility] [place], [place] [address] Phone: [phone] Fax:      [phone]

## 2014-10-22 ENCOUNTER — Encounter (HOSPITAL_COMMUNITY): Payer: Managed Care, Other (non HMO)

## 2014-10-25 ENCOUNTER — Ambulatory Visit: Payer: Managed Care, Other (non HMO) | Admitting: Physical Therapy

## 2014-11-03 ENCOUNTER — Other Ambulatory Visit: Payer: Self-pay

## 2014-11-03 ENCOUNTER — Other Ambulatory Visit: Payer: Self-pay | Admitting: Neurology

## 2014-11-04 ENCOUNTER — Ambulatory Visit: Payer: Managed Care, Other (non HMO) | Admitting: Physical Therapy

## 2014-11-04 ENCOUNTER — Encounter: Payer: Self-pay | Admitting: *Deleted

## 2014-11-04 MED ORDER — METHYLPHENIDATE HCL 10 MG PO TABS
10.0000 mg | ORAL_TABLET | Freq: Three times a day (TID) | ORAL | Status: DC
Start: 2014-11-04 — End: 2015-01-12

## 2014-11-04 NOTE — Progress Notes (Signed)
Ritalin rx. up front [facility] 

## 2014-11-05 ENCOUNTER — Ambulatory Visit: Payer: Managed Care, Other (non HMO) | Attending: Neurology | Admitting: Physical Therapy

## 2014-11-05 DIAGNOSIS — M545 Low back pain, unspecified: Secondary | ICD-10-CM

## 2014-11-05 DIAGNOSIS — M533 Sacrococcygeal disorders, not elsewhere classified: Secondary | ICD-10-CM | POA: Diagnosis present

## 2014-11-05 NOTE — Therapy (Signed)
[facility] [place], [place], [address] Phone: [phone]   Fax:  [phone]  Physical Therapy Evaluation  Patient Details  Name: Kaylee Bryant MRN: [id number] Date of Birth: Oct 25, 1965 Referring Provider:  Marcial Pacas, MD  Encounter Date: 11/05/2014      PT End of Session - 11/05/14 1214    Visit Number 1   Number of Visits 12   Date for PT Re-Evaluation 12/17/14   PT Start Time 0945   PT Stop Time 1051   PT Time Calculation (min) 66 min   Activity Tolerance Patient tolerated treatment well   Behavior During Therapy [facility] for tasks assessed/performed      Past Medical History  Diagnosis Date  . Multiple sclerosis   . Fatigue   . Abnormal Pap smear 2004    HPV, cone biopsy  . DM (diabetes mellitus), gestational 2006    insulin at the time  . IBS (irritable bowel syndrome)     with chronic constipation  . DDD (degenerative disc disease), lumbar     Past Surgical History  Procedure Laterality Date  . Cholecystectomy      1988  . Cesarean section      1996, 2006  . Colposcopy  2004  . Cervical cone biopsy  2004    There were no vitals filed for this visit.  Visit Diagnosis:  Right-sided low back pain without sciatica - Plan: PT plan of care cert/re-cert  Sacral pain - Plan: PT plan of care cert/re-cert      Subjective Assessment - 11/05/14 1226    Subjective Pain in right side of low back and goes to my groin.   Patient Stated Goals Get out of pain.   Currently in Pain? Yes   Pain Score 5    Pain Location Back   Pain Orientation Right;Lower   Pain Descriptors / Indicators Sharp;Stabbing   Pain Type Chronic pain   Pain Onset More than a month ago   Pain Frequency Constant   Aggravating Factors  After work shift.   Pain Relieving Factors Rest.            [facility] PT Assessment - 11/05/14 0001    Assessment   Medical Diagnosis Right sided low back pain without sciatica.   Onset Date/Surgical  Date --  2 years.   Precautions   Precautions None   Restrictions   Weight Bearing Restrictions No   Balance Screen   Has the patient fallen in the past 6 months No   Has the patient had a decrease in activity level because of a fear of falling?  Yes   Is the patient reluctant to leave their home because of a fear of falling?  Yes   [place] residence   Prior Function   Level of Independence Independent   Cognition   Overall Cognitive Status Within Functional Limits for tasks assessed   Posture/Postural Control   Posture Comments Generally good posture.   ROM / Strength   AROM / PROM / Strength AROM;Strength   AROM   Overall AROM Comments Normal spinal movement.   Strength   Overall Strength Comments Normal LE strength.   Palpation   Palpation comment Very tender to palpation over right SIJ and to a lesser degree over the patient's right lower lumbar musculature.   Special Tests    Special Tests Lumbar;Sacrolliac Tests;Leg LengthTest   Lumbar Tests --  Unable to elicit bilateral LE  DTR's.  Mildly + RT SLR test.   Sacroiliac Tests  --  Positive right FABER test.   Leg length test  --  Equal leg lengths.   Ambulation/Gait   Gait Comments Unweighting over right LE during gait cycle.                   [facility] Adult PT Treatment/Exercise - 11/05/14 0001    Modalities   Modalities Electrical Stimulation;Ultrasound   Electrical Stimulation   Electrical Stimulation Location Right LB/SIJ region.   Electrical Stimulation Action 80-150 HZ x 15 minutes constant.   Electrical Stimulation Goals Pain   Ultrasound   Ultrasound Location Right LB/SIJ region.   Ultrasound Parameters 1.50 W/CM2 x 12 minutes.   Manual Therapy   Manual therapy comments STW/M to right low back musculature and SIJ region over associated ligaments x 12 minutes.                     PT Long Term Goals - 11/05/14 1259    PT LONG TERM GOAL #1    Title Ind with HEP.   Time 4   Period Weeks   Status New   PT LONG TERM GOAL #2   Title Pain after workshift not > 3/10.   Time 4   Period Weeks   Status New   PT LONG TERM GOAL #3   Title Perform ADL's with pain not > 3/10.   Time 4   Period Weeks   Status New   PT LONG TERM GOAL #4   Title Eliminate right groin pain.   Time 4   Period Weeks   Status New               Plan - 11/05/14 1228    Clinical Impression Statement The patient reports progressively worsening right sided low back pain over the last two years that now radiates into the right groin region.  Pain can rise to a 7/10 especially after a work shift.  She finds it difficult to achieve a comfortable position.  She states her back pain alters the way she walks at times.   Pt will benefit from skilled therapeutic intervention in order to improve on the following deficits Pain;Decreased activity tolerance   Rehab Potential Excellent   PT Frequency 3x / week   PT Duration 4 weeks   PT Treatment/Interventions ADLs/Self Care Home Management;Cryotherapy;Electrical Stimulation;Moist Heat;Ultrasound;Therapeutic activities;Therapeutic exercise;Patient/family education   PT Next Visit Plan Modalities and STW/M to right SIJ region.         Problem List Patient Active Problem List   Diagnosis Date Noted  . Irregular menses 01/25/2014  . S/P cholecystectomy 06/02/2013  . IBS (irritable bowel syndrome) 06/02/2013  . Oligomenorrhea 01/25/2013  . Atypical nevi 01/25/2013  . MS (multiple sclerosis) 01/25/2013  . Abnormal Pap smear 01/25/2013  . Multiple sclerosis 09/02/2012  . Left knee pain 09/02/2012  . Abnormality of gait 09/02/2012    APPLEGATE, [place] MPT 11/05/2014, 1:03 PM  [facility] [address] [place], [place], [address] Phone: [phone]   Fax:  [phone]

## 2014-11-10 ENCOUNTER — Other Ambulatory Visit: Payer: Self-pay | Admitting: Neurology

## 2014-11-10 ENCOUNTER — Ambulatory Visit: Payer: Managed Care, Other (non HMO) | Admitting: Physical Therapy

## 2014-11-10 DIAGNOSIS — M545 Low back pain, unspecified: Secondary | ICD-10-CM

## 2014-11-10 DIAGNOSIS — M533 Sacrococcygeal disorders, not elsewhere classified: Secondary | ICD-10-CM

## 2014-11-10 NOTE — Therapy (Addendum)
[facility] [place], [place], [address] Phone: [phone]   Fax:  [phone]  Physical Therapy Treatment  Patient Details  Name: Kaylee Bryant MRN: [id number] Date of Birth: [birth year] Referring Provider:  Maurice Small, MD  Encounter Date: 11/10/2014    Past Medical History:  Diagnosis Date  . 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 DDD (degenerative disc disease), lumbar   . DM (diabetes mellitus), gestational 2006   insulin at the time  . Fatigue   . Heart attack   . IBS (irritable bowel syndrome)    with chronic constipation  . Multiple sclerosis ([place])   . Obesity   . 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 CARDIAC CATHETERIZATION N/A 04/05/2015   Procedure: Left Heart Cath and Coronary Angiography;  Surgeon: Troy Sine, MD;  Location: [facility];  Service: Cardiovascular;  Laterality: N/A;  . CERVICAL CONE BIOPSY  2004  . [place], 2006  . CHOLECYSTECTOMY     1988  . COLPOSCOPY  2004  . CORONARY ARTERY BYPASS GRAFT N/A 04/07/2015   Procedure: CORONARY ARTERY BYPASS GRAFTING (CABG), OFF PUMP, TIMES ONE, USING LEFT INTERNAL MAMMARY ARTERY;  Surgeon: Rexene Alberts, MD;  Location: [facility];  Service: Open Heart Surgery;  Laterality: N/A;  LIMA to LAD  . TEE WITHOUT CARDIOVERSION N/A 04/07/2015   Procedure: TRANSESOPHAGEAL ECHOCARDIOGRAM (TEE);  Surgeon: Rexene Alberts, MD;  Location: [facility];  Service: Open Heart Surgery;  Laterality: N/A;    There were no vitals filed for this visit.  Visit Diagnosis:  Right-sided low back pain without sciatica  Sacral pain                                    PT Long Term Goals - 11/05/14 1259      PT LONG TERM GOAL #1   Title Ind with HEP.   Time 4   Period Weeks   Status New     PT LONG TERM GOAL #2   Title Pain after workshift not > 3/10.   Time 4   Period Weeks   Status New     PT LONG TERM GOAL #3   Title Perform ADL's with pain not > 3/10.   Time 4   Period Weeks   Status New     PT LONG TERM GOAL #4   Title Eliminate right groin pain.   Time 4   Period Weeks   Status New               Problem List Patient Active Problem List   Diagnosis Date Noted  . Anemia 07/27/2015  . Restless leg syndrome 07/27/2015  . Hyperlipidemia 07/26/2015  . CAD (coronary artery disease)   . S/P CABG x 3   . 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 Obesity   . Pain in the chest 04/05/2015  . Chest pain 04/05/2015  . 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 NSTEMI (non-ST elevated myocardial infarction) ([place])   . Irregular menses 01/25/2014  . S/P cholecystectomy 06/02/2013  . IBS (irritable bowel syndrome) 06/02/2013  .  Oligomenorrhea 01/25/2013  . Atypical nevi 01/25/2013  . MS (multiple sclerosis) ([place]) 01/25/2013  . Abnormal Pap smear 01/25/2013  . Multiple sclerosis ([place]) 09/02/2012  . Left knee pain 09/02/2012  . Abnormality of gait 09/02/2012    Madelyn Flavors PT  02/20/2016, 6:50 PM  [facility] [place], [place], [address] Phone: [phone]   Fax:  [phone]  PHYSICAL THERAPY DISCHARGE SUMMARY  Visits from Start of Care: 2.  Current functional level related to goals / functional outcomes: Please see above.   Remaining deficits: Patient did not return to PT.   Education / Equipment:  Plan: Patient agrees to discharge.  Patient goals were not met. Patient is being discharged due to not returning since the last visit.  ?????         Mali Applegate MPT

## 2014-11-10 NOTE — Telephone Encounter (Signed)
Per email 07/24

## 2014-11-19 ENCOUNTER — Encounter (HOSPITAL_COMMUNITY): Payer: Managed Care, Other (non HMO)

## 2014-12-14 ENCOUNTER — Other Ambulatory Visit: Payer: Self-pay | Admitting: Neurology

## 2015-01-10 ENCOUNTER — Telehealth: Payer: Self-pay | Admitting: Neurology

## 2015-01-10 NOTE — Telephone Encounter (Signed)
[employer] called to set up delivery for medication. The woman, was not given a name,  said if they did not get a call back that it could be confirmed on-line. Was not given a call back number either.

## 2015-01-12 ENCOUNTER — Other Ambulatory Visit: Payer: Self-pay | Admitting: Neurology

## 2015-01-12 ENCOUNTER — Other Ambulatory Visit: Payer: Self-pay

## 2015-01-12 MED ORDER — METHYLPHENIDATE HCL 10 MG PO TABS: 10.0000 mg | ORAL_TABLET | Freq: Three times a day (TID) | ORAL | Status: DC

## 2015-01-12 NOTE — Telephone Encounter (Signed)
Rx for methylphenidate placed up front for pick up. 

## 2015-01-18 ENCOUNTER — Encounter: Payer: Self-pay | Admitting: Nurse Practitioner

## 2015-01-18 ENCOUNTER — Ambulatory Visit: Payer: Managed Care, Other (non HMO) | Admitting: Nurse Practitioner

## 2015-01-18 ENCOUNTER — Ambulatory Visit (INDEPENDENT_AMBULATORY_CARE_PROVIDER_SITE_OTHER): Payer: Managed Care, Other (non HMO) | Admitting: Nurse Practitioner

## 2015-01-18 VITALS — BP 130/78 | HR 81 | Ht 63.0 in | Wt 235.8 lb

## 2015-01-18 DIAGNOSIS — Z5181 Encounter for therapeutic drug level monitoring: Secondary | ICD-10-CM

## 2015-01-18 DIAGNOSIS — R269 Unspecified abnormalities of gait and mobility: Secondary | ICD-10-CM

## 2015-01-18 DIAGNOSIS — G35 Multiple sclerosis: Secondary | ICD-10-CM

## 2015-01-18 DIAGNOSIS — R5382 Chronic fatigue, unspecified: Secondary | ICD-10-CM | POA: Diagnosis not present

## 2015-01-18 NOTE — Progress Notes (Signed)
[facility]  PATIENT: Kaylee Bryant DOB: 11-09-1965   REASON FOR VISIT: follow up for MS, relapsing remitting, fatigue abnormality of gait, back pain HISTORY FROM:patient    HISTORY OF PRESENT ILLNESS: HISTORY: She was diagnosed with RRMS in 2007, 6 month post partum, she had numbness from waist done, diagnosis was confirmed by marked abnormal MRI brain, in March,2007. There were multiple T2/Flair periventricular lesions, corpus callosum involvement, mutiple ring enhancing lesions. Also had enhancing lesion in cervical and thoracic spines. She was treated at [facility] by Dr. Dellis Filbert, no csf study, but had extensive lab evaluations to rule out mimics. 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 She denies visual changes or other clear relapsing episodes.  I have reviewed report MRI brain by Dr. Brandon Melnick in 01/2007, multiple T2/FLAIR lesions in both hemisphere with corpus callosum involvement, there are slight increase in lesion load. MRI cervical, stable lesions in C6-7, and C2-3. Thoracic, multiple non enhancing lesions.  She tolerated Rebif well, no flu like illness, but complains of bilateral arm, and the lateral thigh lipoatrophy with repeat Rebif injection. There was no clear relapsed over the years, but she has slow worsening gait difficulty over the past 6 months.  MRI in Sept 2012, MRI of thoracic spine Multiple chronic plaques at C7, T6-7, T8-9, T10.  MRI of cervical spine ,Multiple chronic demyelinating plaques at C3, C4 and C7. MRI of brain Multiple round and ovioid, periventricular and subcortical chronic demyelinating plaques. 2 plaques noted in the pons. No abnormal lesions are seen on post contrast views.  Her JC-virus antibody was negative.  After discuss, we started her on Tysarbri infusion since February of 2013. She tolerated the infusion very well, but complains of excessive fatigue over the past few months, she still able to work full-time as a Marine scientist, she complains of difficulty dragging herself out of bed. Modafinil was not approved by insurance.She was put on Ritalin 10mg  tid, which helped her fatigue. feels much better on Tysabri.  JC-virus antibody negative (July 2014), Negative 0.17 in 11/11/2013,   UPDATE December 16 2013: She is doing very well, her gait has improved, tolerating [name], Ritalin 10 mg tid. for her fatigue.JC-virus antibody negative 11/11/2013 .  Most recent MRI of the brain 10/21/2013 with multiple supra tentorial and infra-tentorial chronic demyelinating plaques. No acute plaques. No change from MRI 05/22/2013. She returns for reevaluation  UPDATE April 4th 2016: She works full time as Therapist, sports, s he has more dizziness spells, especially when she has to stand up, she has occasional headaches, She complains of intermittent sharp pain at her right lower thoracic region, X-ray showed DJD, excerbated by walking, Orthopedic evaluation was normal, including bilateral hip,  She is on Tysarbri,taking baclofen 10mg  tid, works well for her lower extremity She has no incotninence, if she stopped,   UPDATE July 26th 2016:She is now complainng of right side low back pain, radiating to right hip, right hip muscle cramping, sometimes difficulty getting out of the bed, to put on her clothes, 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 She has a history of  relapsing remitting multiple sclerosis and is currently on Tysabri . Her next dose is due November 17. She has chronic fatigue from her multiple  sclerosis and is currently on Ritalin 10 mg 3 times daily. In addition she takes Neurontin and baclofen for spasticity. When last seen she was having some low back pain and she was placed on Celebrex which she did not find beneficial. She did have a couple of sessions with physical therapy and is continuing a home exercise program. 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.JC-virus antibody negative (July 2014), Negative 0.17 in 11/11/2013, 07/07/2014 JCV 0.19 negative.She returns for reevaluation   REVIEW OF SYSTEMS: Full 14 system review of systems performed and notable only for those listed, all others are neg:  Constitutional: neg  Cardiovascular: neg Ear/Nose/Throat: neg  Skin: neg Eyes: neg Respiratory: neg Gastroitestinal: neg  Hematology/Lymphatic: neg  Endocrine: neg Musculoskeletal:neg Allergy/Immunology: neg Neurological: neg Psychiatric: neg Sleep : neg   ALLERGIES: No Known Allergies  HOME MEDICATIONS: Outpatient Prescriptions Prior to Visit  Medication Sig Dispense Refill  . baclofen (LIORESAL) 10 MG tablet TAKE ONE TABLET BY MOUTH THREE TIMES DAILY 90 tablet 5  . gabapentin (NEURONTIN) 300 MG capsule TAKE ONE CAPSULE BY MOUTH THREE TIMES DAILY 90 capsule 6  . ibuprofen (ADVIL,MOTRIN) 200 MG tablet Take 800 mg by mouth at bedtime as needed.    . methylphenidate (RITALIN) 10 MG tablet Take 1 tablet (10 mg total) by mouth 3 (three) times daily with meals. Do not take last dose any later than 4pm 90 tablet 0  . natalizumab (TYSABRI) 300 MG/15ML injection Inject 15 mLs (300 mg total) into the vein every 30 (thirty) days. 15 mL 11  . Probiotic Product (ULTRAFLORA IMMUNE HEALTH PO) Take by mouth 1 day or 1 dose.    . celecoxib (CELEBREX) 100 MG capsule Take 1 capsule (100 mg total) by mouth 2 (two) times daily. (Patient not taking: Reported on 01/18/2015) 60 capsule 3   No facility-administered medications prior to visit.    PAST MEDICAL HISTORY: Past Medical History    Diagnosis Date  . Multiple sclerosis ([place])   . Fatigue   . Abnormal Pap smear 2004    HPV, cone biopsy  . DM (diabetes mellitus), gestational 2006    insulin at the time  . IBS (irritable bowel syndrome)     with chronic constipation  . DDD (degenerative disc disease), lumbar     PAST SURGICAL HISTORY: Past Surgical History  Procedure Laterality Date  . Cholecystectomy      1988  . Cesarean section      1996, 2006  . Colposcopy  2004  . Cervical cone biopsy  2004    FAMILY HISTORY: Family History  Problem Relation Age of Onset  . Diabetes Mother   . Hypertension Mother   . Hypertension Father   . Atrial fibrillation Father   . Diabetes Sister   . Breast cancer Maternal Aunt     SOCIAL HISTORY: Social History   Social History  . Marital Status: Married    Spouse Name: N/A  . Number of Children: 4  . Years of Education: college   Occupational History  . RN    Social History Main Topics  . Smoking status: Former Smoker -- 1.50 packs/day for 15 years    Types: Cigarettes    Quit date: 07/04/2003  . Smokeless tobacco: Never Used  . Alcohol Use: 0.6 oz/week    1 Glasses of wine per week  .  Drug Use: No  . Sexual Activity:    Partners: Male    Birth Control/ Protection: Surgical     Comment: vasectomy   Other Topics Concern  . Not on file   Social History Narrative   Patient lives at home with her husband and children. Patient works for the [employer] in [place]. College education.      PHYSICAL EXAM  Filed Vitals:   01/18/15 1609  BP: 130/78  Pulse: 81  Height: 5\' 3"  (1.6 m)  Weight: 235 lb 12.8 oz (106.958 kg)   Body mass index is 41.78 kg/(m^2).  Generalized: Well developed, obese female in no acute distress  Head: normocephalic and atraumatic,. Oropharynx benign  Neck: Supple, no carotid bruits  Cardiac: Regular rate rhythm, no murmur  Musculoskeletal: No deformity   Neurological examination   Mentation: Alert oriented to time,  place, history taking. Attention span and concentration appropriate. Recent and remote memory intact.  Follows all commands speech and language fluent.   Cranial nerve II-XII: Fundoscopic exam reveals sharp disc margins.Pupils were equal round reactive to light extraocular movements were full, visual field were full on confrontational test. Facial sensation and strength were normal. hearing was intact to finger rubbing bilaterally. Uvula tongue midline. head turning and shoulder shrug were normal and symmetric.Tongue protrusion into cheek strength was normal. Motor: She has mild spasticity of bilateral lower extremities, mild bilateral hip flexion weakness Sensory: normal and symmetric to light touch, pinprick, and  Vibration, proprioception in upper and lower extremities Coordination: finger-nose-finger, heel-to-shin bilaterally, no dysmetria Reflexes: Brachioradialis 2/2, biceps 2/2, triceps 2/2, patellar 2/2, Achilles 2/2, plantar responses were flexor bilaterally. Gait and Station: Rising up from seated position without assistance, wide based and cautious gait  smooth turning, able to perform tiptoe, and heel walking without difficulty. Tandem gait is mildly unsteady. No assistive device  DIAGNOSTIC DATA (LABS, IMAGING, TESTING) - I reviewed patient records, labs, notes, testing and imaging myself where available.  Lab Results  Component Value Date   WBC 8.3 06/28/2014   HGB 14.1 06/28/2014   HCT 42.3 06/28/2014   MCV 89 06/28/2014   PLT 334 06/02/2013      Component Value Date/Time   NA 142 06/28/2014 1420   NA 141 06/02/2013 1640   K 4.2 06/28/2014 1420   CL 101 06/28/2014 1420   CO2 25 06/28/2014 1420   GLUCOSE 150* 06/28/2014 1420   GLUCOSE 104* 06/02/2013 1640   BUN 9 06/28/2014 1420   BUN 6 06/02/2013 1640   CREATININE 0.66 06/28/2014 1420   CREATININE 0.65 06/02/2013 1640   CALCIUM 9.5 06/28/2014 1420   PROT 7.0 06/28/2014 1420   PROT 7.1 06/02/2013 1640   ALBUMIN 4.4  06/28/2014 1420   ALBUMIN 4.5 06/02/2013 1640   AST 18 06/28/2014 1420   ALT 24 06/28/2014 1420   ALKPHOS 59 06/28/2014 1420   BILITOT 0.4 06/28/2014 1420   BILITOT 0.5 06/02/2013 1640   GFRNONAA 105 06/28/2014 1420   GFRAA 121 06/28/2014 1420    ASSESSMENT AND PLAN  49 y.o. year old female  has a past medical history of Multiple sclerosis ([place]); Fatigue; IBS (irritable bowel syndrome); and DDD (degenerative disc disease), lumbar. here to follow-up.MRI brain April 2016: Scattered round and ovoid, periventricular, subcortical, juxtacortical, midbrain, pontine chronic demyelinating plaques. Some of these are hypointense on T1 views. No abnormal lesions are seen on post contrast views.  No significant change from MRI on 10/21/13.JC-virus antibody negative (July 2014), Negative 0.17 in 11/11/2013, 07/07/2014  JCV 0.19 negative.   Continue Tysabri monthly CBC CMP JC virus and A1C today Continue Neurontin 300 mg 3 times daily Continue baclofen 10 mg 3 times daily for spasticity Continue Ritalin 10 mg 3 times daily for fatigue Continue home exercise program for  low back pain, right hip pain, most likely right SI joint etiology, versus right lumbar radiculopathy,  F/U 6 months repeat MRI of the brain at that time Vst time 25 min Dennie Bible, [facility], [facility], [facility] [address], [place],  [address] [phone]

## 2015-01-18 NOTE — Patient Instructions (Signed)
Continue Tysabri monthly CBC CMP JC virus and A1C today Continue same meds F/U 6 months repeat MRI at that time

## 2015-01-19 LAB — CBC WITH DIFFERENTIAL/PLATELET
Basophils Absolute: 0.1 10*3/uL (ref 0.0–0.2)
Basos: 1 %
EOS (ABSOLUTE): 0.2 10*3/uL (ref 0.0–0.4)
Eos: 2 %
Hematocrit: 37.3 % (ref 34.0–46.6)
Hemoglobin: 12.8 g/dL (ref 11.1–15.9)
Immature Grans (Abs): 0 10*3/uL (ref 0.0–0.1)
Immature Granulocytes: 0 %
Lymphocytes Absolute: 4.4 10*3/uL — ABNORMAL HIGH (ref 0.7–3.1)
Lymphs: 57 %
MCH: 30 pg (ref 26.6–33.0)
MCHC: 34.3 g/dL (ref 31.5–35.7)
MCV: 88 fL (ref 79–97)
Monocytes Absolute: 0.6 10*3/uL (ref 0.1–0.9)
Monocytes: 7 %
Neutrophils Absolute: 2.6 10*3/uL (ref 1.4–7.0)
Neutrophils: 33 %
Platelets: 248 10*3/uL (ref 150–379)
RBC: 4.26 x10E6/uL (ref 3.77–5.28)
RDW: 14.1 % (ref 12.3–15.4)
WBC: 7.8 10*3/uL (ref 3.4–10.8)

## 2015-01-19 LAB — COMPREHENSIVE METABOLIC PANEL
ALT: 38 IU/L — ABNORMAL HIGH (ref 0–32)
AST: 28 IU/L (ref 0–40)
Albumin/Globulin Ratio: 1.7 (ref 1.1–2.5)
Albumin: 4.3 g/dL (ref 3.5–5.5)
Alkaline Phosphatase: 57 IU/L (ref 39–117)
BUN/Creatinine Ratio: 14 (ref 9–23)
BUN: 10 mg/dL (ref 6–24)
Bilirubin Total: 0.4 mg/dL (ref 0.0–1.2)
CO2: 23 mmol/L (ref 18–29)
Calcium: 8.8 mg/dL (ref 8.7–10.2)
Chloride: 99 mmol/L (ref 97–106)
Creatinine, Ser: 0.74 mg/dL (ref 0.57–1.00)
GFR calc Af Amer: 111 mL/min/{1.73_m2} (ref >59)
GFR calc non Af Amer: 96 mL/min/{1.73_m2} (ref >59)
Globulin, Total: 2.6 g/dL (ref 1.5–4.5)
Glucose: 209 mg/dL — ABNORMAL HIGH (ref 65–99)
Potassium: 3.7 mmol/L (ref 3.5–5.2)
Sodium: 140 mmol/L (ref 136–144)
Total Protein: 6.9 g/dL (ref 6.0–8.5)

## 2015-01-19 LAB — HEMOGLOBIN A1C
Est. average glucose Bld gHb Est-mCnc: 186 mg/dL
Hgb A1c MFr Bld: 8.1 % — ABNORMAL HIGH (ref 4.8–5.6)

## 2015-01-19 NOTE — Progress Notes (Signed)
I have reviewed and agreed above plan. 

## 2015-01-20 ENCOUNTER — Telehealth: Payer: Self-pay | Admitting: *Deleted

## 2015-01-20 NOTE — Telephone Encounter (Signed)
-----   Message from Kaylee Bible, NP sent at 01/19/2015  2:40 PM EDT ----- Labs stable except Glucose 209 and hemoglobin A1c 8.1. Need to follow up with primary care, please call the patient

## 2015-01-20 NOTE — Telephone Encounter (Signed)
Unable to reach patient on home phone, no answering machine to leave message. Called mobile number and spoke with patient. Informed her per Daun Peacock NP that her labs are stable except her glucose 209 and Hgb A1C 8.1. Advised she contact her PCP to be followed up and inform him of these lab results. She verbalized understanding, agreement.

## 2015-01-26 ENCOUNTER — Telehealth: Payer: Self-pay | Admitting: Nurse Practitioner

## 2015-01-26 NOTE — Telephone Encounter (Signed)
JC-virus antibody negative (July 2014), Negative 0.17 in 11/11/2013, 07/07/2014 JCV 0.19 negative 01/19/15 0.15 negative

## 2015-01-28 ENCOUNTER — Ambulatory Visit: Payer: Managed Care, Other (non HMO) | Admitting: Nurse Practitioner

## 2015-01-28 ENCOUNTER — Encounter: Payer: Self-pay | Admitting: Nurse Practitioner

## 2015-02-08 ENCOUNTER — Telehealth: Payer: Self-pay | Admitting: Neurology

## 2015-02-08 NOTE — Telephone Encounter (Signed)
[facility]/Tysabri [phone] called regarding Tysabri shipment

## 2015-02-08 NOTE — Telephone Encounter (Signed)
I have spoken with [facility]--and after speaking with Otila Kluver in the infusion suite, confirmed pt's infusion date of this Thursday, 02-10-15.  [facility] sts. she will ship Tysabri out today, to arrive tomorrow/fim

## 2015-02-10 ENCOUNTER — Ambulatory Visit: Payer: Managed Care, Other (non HMO) | Admitting: Nurse Practitioner

## 2015-03-08 ENCOUNTER — Telehealth: Payer: Self-pay | Admitting: Neurology

## 2015-03-08 ENCOUNTER — Other Ambulatory Visit: Payer: Self-pay | Admitting: Neurology

## 2015-03-08 NOTE — Telephone Encounter (Signed)
[facility] [phone] called to schedule Tysabri delivery for infusion Thursday 03/10/15.

## 2015-03-08 NOTE — Telephone Encounter (Signed)
Information provided to [facility] in [place] for delivery scheduling.

## 2015-03-09 ENCOUNTER — Other Ambulatory Visit: Payer: Self-pay

## 2015-03-09 MED ORDER — METHYLPHENIDATE HCL 10 MG PO TABS: 10.0000 mg | ORAL_TABLET | Freq: Three times a day (TID) | ORAL | Status: DC

## 2015-03-09 NOTE — Telephone Encounter (Signed)
Please let patient know, Ritalin 10 mg 3 times a day 90 tablets is ready for her

## 2015-03-09 NOTE — Addendum Note (Signed)
Addended by: Marcial Pacas on: 03/09/2015 05:31 PM   Modules accepted: Orders

## 2015-03-10 NOTE — Telephone Encounter (Signed)
Rx signed and placed up front for pickup. 

## 2015-04-05 ENCOUNTER — Encounter (HOSPITAL_COMMUNITY)
Admission: EM | Disposition: A | Discharge status: Home / Self Care | Payer: Self-pay | Source: Home / Self Care | Attending: Thoracic Surgery (Cardiothoracic Vascular Surgery)

## 2015-04-05 ENCOUNTER — Other Ambulatory Visit: Payer: Self-pay | Admitting: *Deleted

## 2015-04-05 ENCOUNTER — Observation Stay (HOSPITAL_COMMUNITY): Payer: Managed Care, Other (non HMO)

## 2015-04-05 ENCOUNTER — Emergency Department (HOSPITAL_COMMUNITY): Payer: Managed Care, Other (non HMO)

## 2015-04-05 ENCOUNTER — Encounter (HOSPITAL_COMMUNITY): Payer: Self-pay | Admitting: Emergency Medicine

## 2015-04-05 ENCOUNTER — Inpatient Hospital Stay (HOSPITAL_COMMUNITY)
Admission: EM | Admit: 2015-04-05 | Discharge: 2015-04-11 | DRG: 234 | Disposition: A | Discharge status: Home / Self Care | Payer: Managed Care, Other (non HMO) | Attending: Thoracic Surgery (Cardiothoracic Vascular Surgery) | Admitting: Thoracic Surgery (Cardiothoracic Vascular Surgery)

## 2015-04-05 DIAGNOSIS — Z833 Family history of diabetes mellitus: Secondary | ICD-10-CM

## 2015-04-05 DIAGNOSIS — G35 Multiple sclerosis: Secondary | ICD-10-CM | POA: Diagnosis present

## 2015-04-05 DIAGNOSIS — E669 Obesity, unspecified: Secondary | ICD-10-CM | POA: Diagnosis present

## 2015-04-05 DIAGNOSIS — Z87891 Personal history of nicotine dependence: Secondary | ICD-10-CM

## 2015-04-05 DIAGNOSIS — D62 Acute posthemorrhagic anemia: Secondary | ICD-10-CM | POA: Diagnosis not present

## 2015-04-05 DIAGNOSIS — Z8249 Family history of ischemic heart disease and other diseases of the circulatory system: Secondary | ICD-10-CM

## 2015-04-05 DIAGNOSIS — I2510 Atherosclerotic heart disease of native coronary artery without angina pectoris: Secondary | ICD-10-CM | POA: Diagnosis not present

## 2015-04-05 DIAGNOSIS — Z7984 Long term (current) use of oral hypoglycemic drugs: Secondary | ICD-10-CM

## 2015-04-05 DIAGNOSIS — Z951 Presence of aortocoronary bypass graft: Secondary | ICD-10-CM

## 2015-04-05 DIAGNOSIS — I214 Non-ST elevation (NSTEMI) myocardial infarction: Secondary | ICD-10-CM | POA: Diagnosis not present

## 2015-04-05 DIAGNOSIS — E1122 Type 2 diabetes mellitus with diabetic chronic kidney disease: Secondary | ICD-10-CM | POA: Diagnosis present

## 2015-04-05 DIAGNOSIS — R079 Chest pain, unspecified: Secondary | ICD-10-CM

## 2015-04-05 DIAGNOSIS — Z6841 Body Mass Index (BMI) 40.0 and over, adult: Secondary | ICD-10-CM

## 2015-04-05 DIAGNOSIS — R7989 Other specified abnormal findings of blood chemistry: Secondary | ICD-10-CM

## 2015-04-05 DIAGNOSIS — I25110 Atherosclerotic heart disease of native coronary artery with unstable angina pectoris: Secondary | ICD-10-CM | POA: Diagnosis present

## 2015-04-05 DIAGNOSIS — I209 Angina pectoris, unspecified: Secondary | ICD-10-CM

## 2015-04-05 DIAGNOSIS — J9811 Atelectasis: Secondary | ICD-10-CM | POA: Diagnosis not present

## 2015-04-05 DIAGNOSIS — K589 Irritable bowel syndrome without diarrhea: Secondary | ICD-10-CM | POA: Diagnosis present

## 2015-04-05 DIAGNOSIS — R071 Chest pain on breathing: Secondary | ICD-10-CM

## 2015-04-05 HISTORY — PX: CARDIAC CATHETERIZATION: SHX172

## 2015-04-05 HISTORY — DX: Atherosclerotic heart disease of native coronary artery with unstable angina pectoris: I25.110

## 2015-04-05 HISTORY — DX: Obesity, unspecified: E66.9

## 2015-04-05 HISTORY — DX: Presence of aortocoronary bypass graft: Z95.1

## 2015-04-05 LAB — BASIC METABOLIC PANEL
Anion gap: 13 (ref 5–15)
BUN: 12 mg/dL (ref 6–20)
CO2: 23 mmol/L (ref 22–32)
Calcium: 8.8 mg/dL — ABNORMAL LOW (ref 8.9–10.3)
Chloride: 101 mmol/L (ref 101–111)
Creatinine, Ser: 0.59 mg/dL (ref 0.44–1.00)
GFR calc Af Amer: >60 mL/min (ref >60)
GFR calc non Af Amer: >60 mL/min (ref >60)
Glucose, Bld: 218 mg/dL — ABNORMAL HIGH (ref 65–99)
Potassium: 3.9 mmol/L (ref 3.5–5.1)
Sodium: 137 mmol/L (ref 135–145)

## 2015-04-05 LAB — PROTIME-INR
INR: 1.13 (ref 0.00–1.49)
Prothrombin Time: 14.6 seconds (ref 11.6–15.2)

## 2015-04-05 LAB — DIFFERENTIAL
Basophils Absolute: 0.1 10*3/uL (ref 0.0–0.1)
Basophils Relative: 1 %
Eosinophils Absolute: 0.2 10*3/uL (ref 0.0–0.7)
Eosinophils Relative: 3 %
Lymphocytes Relative: 47 %
Lymphs Abs: 3.6 10*3/uL (ref 0.7–4.0)
Monocytes Absolute: 0.6 10*3/uL (ref 0.1–1.0)
Monocytes Relative: 8 %
Neutro Abs: 3.2 10*3/uL (ref 1.7–7.7)
Neutrophils Relative %: 41 %

## 2015-04-05 LAB — PREGNANCY, URINE: Preg Test, Ur: NEGATIVE

## 2015-04-05 LAB — CBC
HCT: 40.5 % (ref 36.0–46.0)
Hemoglobin: 14 g/dL (ref 12.0–15.0)
MCH: 30.3 pg (ref 26.0–34.0)
MCHC: 34.6 g/dL (ref 30.0–36.0)
MCV: 87.7 fL (ref 78.0–100.0)
Platelets: 230 10*3/uL (ref 150–400)
RBC: 4.62 MIL/uL (ref 3.87–5.11)
RDW: 14.1 % (ref 11.5–15.5)
WBC: 7.7 10*3/uL (ref 4.0–10.5)

## 2015-04-05 LAB — HEPATIC FUNCTION PANEL
ALT: 85 U/L — ABNORMAL HIGH (ref 14–54)
AST: 80 U/L — ABNORMAL HIGH (ref 15–41)
Albumin: 4 g/dL (ref 3.5–5.0)
Alkaline Phosphatase: 58 U/L (ref 38–126)
Bilirubin, Direct: 0.2 mg/dL (ref 0.1–0.5)
Indirect Bilirubin: 0.6 mg/dL (ref 0.3–0.9)
Total Bilirubin: 0.8 mg/dL (ref 0.3–1.2)
Total Protein: 6.7 g/dL (ref 6.5–8.1)

## 2015-04-05 LAB — LIPID PANEL
Cholesterol: 199 mg/dL (ref 0–200)
HDL: 34 mg/dL — ABNORMAL LOW (ref >40)
LDL Cholesterol: 144 mg/dL — ABNORMAL HIGH (ref 0–99)
Total CHOL/HDL Ratio: 5.9 RATIO
Triglycerides: 107 mg/dL (ref <150)
VLDL: 21 mg/dL (ref 0–40)

## 2015-04-05 LAB — GLUCOSE, CAPILLARY
Glucose-Capillary: 184 mg/dL — ABNORMAL HIGH (ref 65–99)
Glucose-Capillary: 162 mg/dL — ABNORMAL HIGH (ref 65–99)
Glucose-Capillary: 137 mg/dL — ABNORMAL HIGH (ref 65–99)
Glucose-Capillary: 117 mg/dL — ABNORMAL HIGH (ref 65–99)

## 2015-04-05 LAB — TROPONIN I
Troponin I: 0.15 ng/mL — ABNORMAL HIGH (ref <0.031)
Troponin I: 0.3 ng/mL — ABNORMAL HIGH (ref <0.031)

## 2015-04-05 LAB — I-STAT TROPONIN, ED: Troponin i, poc: 0.06 ng/mL (ref 0.00–0.08)

## 2015-04-05 LAB — HEPARIN LEVEL (UNFRACTIONATED): Heparin Unfractionated: 0.49 IU/mL (ref 0.30–0.70)

## 2015-04-05 LAB — TSH: TSH: 1.684 u[IU]/mL (ref 0.350–4.500)

## 2015-04-05 SURGERY — LEFT HEART CATH AND CORONARY ANGIOGRAPHY

## 2015-04-05 MED ORDER — NITROGLYCERIN 1 MG/10 ML FOR IR/CATH LAB
INTRA_ARTERIAL | Status: AC
  Filled 2015-04-05: qty 10

## 2015-04-05 MED ORDER — SODIUM CHLORIDE 0.9 % IV SOLN
INTRAVENOUS | Status: DC
  Administered 2015-04-05 – 2015-04-06 (×3): via INTRAVENOUS

## 2015-04-05 MED ORDER — HEPARIN (PORCINE) IN NACL 2-0.9 UNIT/ML-% IJ SOLN
INTRAMUSCULAR | Status: AC
  Filled 2015-04-05: qty 500

## 2015-04-05 MED ORDER — NITROGLYCERIN IN D5W 200-5 MCG/ML-% IV SOLN
INTRAVENOUS | Status: DC | PRN
  Administered 2015-04-05: 10 ug/min via INTRAVENOUS

## 2015-04-05 MED ORDER — FENTANYL CITRATE (PF) 100 MCG/2ML IJ SOLN
INTRAMUSCULAR | Status: DC | PRN
  Administered 2015-04-05: 50 ug via INTRAVENOUS

## 2015-04-05 MED ORDER — NITROGLYCERIN IN D5W 200-5 MCG/ML-% IV SOLN
INTRAVENOUS | Status: AC
  Filled 2015-04-05: qty 250

## 2015-04-05 MED ORDER — HEPARIN BOLUS VIA INFUSION
4000.0000 [IU] | Freq: Once | INTRAVENOUS | Status: AC
  Filled 2015-04-05: qty 4000

## 2015-04-05 MED ORDER — GABAPENTIN 300 MG PO CAPS
300.0000 mg | ORAL_CAPSULE | Freq: Three times a day (TID) | ORAL | Status: DC
  Administered 2015-04-05 – 2015-04-06 (×5): 300 mg via ORAL
  Filled 2015-04-05 (×5): qty 1

## 2015-04-05 MED ORDER — HEPARIN SODIUM (PORCINE) 1000 UNIT/ML IJ SOLN
INTRAMUSCULAR | Status: DC | PRN
Start: 2015-04-05 — End: 2015-04-05
  Administered 2015-04-05: 5000 [IU] via INTRAVENOUS

## 2015-04-05 MED ORDER — NITROGLYCERIN IN D5W 200-5 MCG/ML-% IV SOLN
0.0000 ug/min | INTRAVENOUS | Status: DC
  Administered 2015-04-05: 20 ug/min via INTRAVENOUS
  Administered 2015-04-07: 10 ug/min via INTRAVENOUS

## 2015-04-05 MED ORDER — METOPROLOL TARTRATE 25 MG PO TABS
25.0000 mg | ORAL_TABLET | Freq: Two times a day (BID) | ORAL | Status: DC
  Administered 2015-04-05 – 2015-04-06 (×3): 25 mg via ORAL
  Filled 2015-04-05 (×3): qty 1

## 2015-04-05 MED ORDER — ASPIRIN EC 81 MG PO TBEC
81.0000 mg | DELAYED_RELEASE_TABLET | Freq: Every day | ORAL | Status: DC
  Administered 2015-04-05 – 2015-04-06 (×2): 81 mg via ORAL
  Filled 2015-04-05 (×2): qty 1

## 2015-04-05 MED ORDER — LIDOCAINE HCL (PF) 1 % IJ SOLN
INTRAMUSCULAR | Status: AC
  Filled 2015-04-05: qty 30

## 2015-04-05 MED ORDER — ASPIRIN 81 MG PO CHEW: 81.0000 mg | CHEWABLE_TABLET | ORAL | Status: DC

## 2015-04-05 MED ORDER — ACETAMINOPHEN 325 MG PO TABS
650.0000 mg | ORAL_TABLET | ORAL | Status: DC | PRN
  Administered 2015-04-05 – 2015-04-06 (×2): 650 mg via ORAL

## 2015-04-05 MED ORDER — SODIUM CHLORIDE 0.9 % IJ SOLN
3.0000 mL | Freq: Two times a day (BID) | INTRAMUSCULAR | Status: DC
  Administered 2015-04-05 – 2015-04-06 (×3): 3 mL via INTRAVENOUS

## 2015-04-05 MED ORDER — SODIUM CHLORIDE 0.9 % IV SOLN: 250.0000 mL | INTRAVENOUS | Status: DC | PRN

## 2015-04-05 MED ORDER — NITROGLYCERIN 1 MG/10 ML FOR IR/CATH LAB
INTRA_ARTERIAL | Status: DC | PRN
Start: 2015-04-05 — End: 2015-04-05
  Administered 2015-04-05: 15 mL via INTRA_ARTERIAL

## 2015-04-05 MED ORDER — MORPHINE SULFATE (PF) 2 MG/ML IV SOLN: 2.0000 mg | INTRAVENOUS | Status: DC | PRN

## 2015-04-05 MED ORDER — HEPARIN SODIUM (PORCINE) 1000 UNIT/ML IJ SOLN
INTRAMUSCULAR | Status: AC
  Filled 2015-04-05: qty 1

## 2015-04-05 MED ORDER — INSULIN ASPART 100 UNIT/ML ~~LOC~~ SOLN
0.0000 [IU] | Freq: Three times a day (TID) | SUBCUTANEOUS | Status: DC
  Administered 2015-04-05: 2 [IU] via SUBCUTANEOUS
  Administered 2015-04-06: 1 [IU] via SUBCUTANEOUS
  Administered 2015-04-06: 2 [IU] via SUBCUTANEOUS
  Administered 2015-04-06: 1 [IU] via SUBCUTANEOUS

## 2015-04-05 MED ORDER — HEPARIN (PORCINE) IN NACL 100-0.45 UNIT/ML-% IJ SOLN
1400.0000 [IU]/h | INTRAMUSCULAR | Status: DC
  Administered 2015-04-06: 1250 [IU]/h via INTRAVENOUS
  Administered 2015-04-06: 1400 [IU]/h via INTRAVENOUS
  Filled 2015-04-05 (×3): qty 250

## 2015-04-05 MED ORDER — FENTANYL CITRATE (PF) 100 MCG/2ML IJ SOLN
INTRAMUSCULAR | Status: AC
  Filled 2015-04-05: qty 2

## 2015-04-05 MED ORDER — LIDOCAINE HCL (PF) 1 % IJ SOLN
INTRAMUSCULAR | Status: DC | PRN
  Administered 2015-04-05: 16:00:00

## 2015-04-05 MED ORDER — TRAZODONE HCL 50 MG PO TABS
50.0000 mg | ORAL_TABLET | Freq: Every day | ORAL | Status: DC
  Administered 2015-04-05 – 2015-04-06 (×2): 50 mg via ORAL
  Filled 2015-04-05 (×2): qty 1

## 2015-04-05 MED ORDER — IOHEXOL 350 MG/ML SOLN
INTRAVENOUS | Status: DC | PRN
  Administered 2015-04-05: 90 mL via INTRA_ARTERIAL

## 2015-04-05 MED ORDER — MIDAZOLAM HCL 2 MG/2ML IJ SOLN
INTRAMUSCULAR | Status: AC
  Filled 2015-04-05: qty 2

## 2015-04-05 MED ORDER — ATORVASTATIN CALCIUM 80 MG PO TABS
80.0000 mg | ORAL_TABLET | Freq: Every day | ORAL | Status: DC
  Administered 2015-04-06 – 2015-04-10 (×4): 80 mg via ORAL
  Filled 2015-04-05 (×4): qty 1

## 2015-04-05 MED ORDER — NITROGLYCERIN 0.4 MG/HR TD PT24
0.4000 mg | MEDICATED_PATCH | Freq: Every day | TRANSDERMAL | Status: DC
  Administered 2015-04-05: 0.4 mg via TRANSDERMAL
  Filled 2015-04-05 (×2): qty 1

## 2015-04-05 MED ORDER — PNEUMOCOCCAL VAC POLYVALENT 25 MCG/0.5ML IJ INJ: 0.5000 mL | INJECTION | INTRAMUSCULAR | Status: DC

## 2015-04-05 MED ORDER — METHYLPHENIDATE HCL 5 MG PO TABS
10.0000 mg | ORAL_TABLET | Freq: Three times a day (TID) | ORAL | Status: DC
  Administered 2015-04-05 – 2015-04-06 (×5): 10 mg via ORAL
  Filled 2015-04-05 (×5): qty 2

## 2015-04-05 MED ORDER — VERAPAMIL HCL 2.5 MG/ML IV SOLN
INTRAVENOUS | Status: AC
  Filled 2015-04-05: qty 2

## 2015-04-05 MED ORDER — ONDANSETRON HCL 4 MG/2ML IJ SOLN: 4.0000 mg | Freq: Four times a day (QID) | INTRAMUSCULAR | Status: DC | PRN

## 2015-04-05 MED ORDER — BACLOFEN 10 MG PO TABS
10.0000 mg | ORAL_TABLET | Freq: Three times a day (TID) | ORAL | Status: DC
  Administered 2015-04-05 – 2015-04-06 (×5): 10 mg via ORAL
  Filled 2015-04-05 (×9): qty 1

## 2015-04-05 MED ORDER — NITROGLYCERIN 2 % TD OINT
1.0000 [in_us] | TOPICAL_OINTMENT | Freq: Once | TRANSDERMAL | Status: AC
  Administered 2015-04-05: 1 [in_us] via TOPICAL
  Filled 2015-04-05: qty 1

## 2015-04-05 MED ORDER — NATALIZUMAB 300 MG/15ML IV CONC: 300.0000 mg | INTRAVENOUS | Status: DC

## 2015-04-05 MED ORDER — SODIUM CHLORIDE 0.9 % IV SOLN
INTRAVENOUS | Status: DC
  Administered 2015-04-05 (×2): via INTRAVENOUS

## 2015-04-05 MED ORDER — MIDAZOLAM HCL 2 MG/2ML IJ SOLN
INTRAMUSCULAR | Status: DC | PRN
  Administered 2015-04-05: 2 mg via INTRAVENOUS

## 2015-04-05 MED ORDER — ASPIRIN EC 325 MG PO TBEC
325.0000 mg | DELAYED_RELEASE_TABLET | Freq: Every day | ORAL | Status: DC
  Administered 2015-04-05: 325 mg via ORAL
  Filled 2015-04-05: qty 1

## 2015-04-05 MED ORDER — ACETAMINOPHEN 325 MG PO TABS
650.0000 mg | ORAL_TABLET | ORAL | Status: DC | PRN
  Administered 2015-04-06: 650 mg via ORAL
  Filled 2015-04-05 (×2): qty 2

## 2015-04-05 MED ORDER — SODIUM CHLORIDE 0.9 % IJ SOLN: 3.0000 mL | INTRAMUSCULAR | Status: DC | PRN

## 2015-04-05 MED ORDER — ATORVASTATIN CALCIUM 80 MG PO TABS: 80.0000 mg | ORAL_TABLET | Freq: Every day | ORAL | Status: DC

## 2015-04-05 MED ORDER — ACETAMINOPHEN 325 MG PO TABS
ORAL_TABLET | ORAL | Status: AC
  Filled 2015-04-05: qty 2

## 2015-04-05 MED ORDER — HEPARIN (PORCINE) IN NACL 100-0.45 UNIT/ML-% IJ SOLN
1250.0000 [IU]/h | INTRAMUSCULAR | Status: DC
  Administered 2015-04-05: 1250 [IU]/h via INTRAVENOUS
  Filled 2015-04-05: qty 250

## 2015-04-05 SURGICAL SUPPLY — 12 items
CATH INFINITI 5FR ANG PIGTAIL (CATHETERS) ×2 IMPLANT
CATH OPTITORQUE TIG 4.0 5F (CATHETERS) ×2 IMPLANT
DEVICE RAD COMP TR BAND LRG (VASCULAR PRODUCTS) ×2 IMPLANT
GLIDESHEATH SLEND SS 6F .021 (SHEATH) ×2 IMPLANT
KIT ENCORE 26 ADVANTAGE (KITS) IMPLANT
KIT HEART LEFT (KITS) ×2 IMPLANT
PACK CARDIAC CATHETERIZATION (CUSTOM PROCEDURE TRAY) ×2 IMPLANT
SYR MEDRAD MARK V 150ML (SYRINGE) ×2 IMPLANT
TRANSDUCER W/STOPCOCK (MISCELLANEOUS) ×2 IMPLANT
TUBING CIL FLEX 10 FLL-RA (TUBING) ×2 IMPLANT
WIRE HI TORQ VERSACORE-J 145CM (WIRE) ×2 IMPLANT
WIRE SAFE-T 1.5MM-J .035X260CM (WIRE) ×2 IMPLANT

## 2015-04-05 NOTE — Progress Notes (Signed)
TR BAND REMOVAL  LOCATION:    Radial Rt  DEFLATED PER PROTOCOL:   Yes  TIME BAND OFF / DRESSING APPLIED:    18:55  SITE UPON ARRIVAL:    Level 0  SITE AFTER BAND REMOVAL:    Level 0  CIRCULATION SENSATION AND MOVEMENT:    Within Normal Limits : yes.Rt radial pulse 2+  COMMENTS:   Tegaderm and 2x2 applied to rt radial site. Rt hand is warm,rt radial pulse present, rt capillary <3sec

## 2015-04-05 NOTE — Progress Notes (Signed)
50 y.o. female with a history of DM, IBD, DDD of lumbar, former smoker (quit 2005) and multiple sclerosis who admitted with symptoms concerning for unstable angina. EKG without acute findings. POc troponin 0.06. Trop I of 0.15. Started IV heparin and nitro patch. CXR clear. Currently chest pain free. Dad has CHF, HTN and DM.  Seen by cardiology for unstable angina, currently nothing by mouth, pending further recommendations from cardiology 2-D echo ordered and pending  Patient scheduled for cardiac cath today

## 2015-04-05 NOTE — H&P (View-Only) (Signed)
CARDIOLOGY CONSULT NOTE   Patient ID: Kaylee Bryant MRN: [id number] DOB/AGE: Feb 24, 1966 50 y.o.  Admit date: 04/05/2015  Primary Physician   Jonathon Bellows, MD Primary Cardiologist   New Reason for Consultation   Chest pain Requesting Physician Dr. Hal Hope  HPI: Kaylee Bryant is a 50 y.o. female with a history of DM, IBD, DDD of lumbar, former smoker (quit 2005)  and multiple sclerosis who admitted with symptoms concerning for unstable angina.  No prior cardiac hx. The patient has been having substernal chest tightness/pressue for the past 2-3 days. The first episode occurred while putting a bed sheet. Her pain occurs with minimal exertion and relieves with rest in 5 mins. The chest pain radiates to her jaw and neck. She did not shoveled the snow. Last night she woke up from sleep with 10/10 chest tightness associated with diaphoresis, SOB and nausea. EMS was called, given 325mg  aspirin and relief with SL nitro x 2. The patient denies vomiting, fever, palpitations, orthopnea, PND, dizziness, syncope, cough, congestion, abdominal pain, hematochezia, melena, lower extremity edema.  EKG without acute findings. POc troponin 0.06. Trop I of 0.15. Started IV heparin and nitro patch. CXR clear. Currently chest pain free. Dad has CHF, HTN and DM.    Past Medical History  Diagnosis Date  . Multiple sclerosis ([place])   . Fatigue   . Abnormal Pap smear 2004    HPV, cone biopsy  . DM (diabetes mellitus), gestational 2006    insulin at the time  . IBS (irritable bowel syndrome)     with chronic constipation  . DDD (degenerative disc disease), lumbar      Past Surgical History  Procedure Laterality Date  . Cholecystectomy      1988  . Cesarean section      1996, 2006  . Colposcopy  2004  . Cervical cone biopsy  2004    No Known Allergies  I have reviewed the patient's current medications . aspirin EC  325 mg Oral Daily  . baclofen  10 mg Oral TID  . gabapentin  300 mg Oral TID  .  insulin aspart  0-9 Units Subcutaneous TID WC  . methylphenidate  10 mg Oral TID WC  . nitroGLYCERIN  0.4 mg Transdermal Daily  . [START ON 04/06/2015] pneumococcal 23 valent vaccine  0.5 mL Intramuscular Tomorrow-1000  . traZODone  50 mg Oral QHS   . heparin 1,250 Units/hr (04/05/15 0106)   acetaminophen, morphine injection, ondansetron (ZOFRAN) IV  Prior to Admission medications   Medication Sig Start Date End Date Taking? Authorizing Provider  baclofen (LIORESAL) 10 MG tablet TAKE ONE TABLET BY MOUTH THREE TIMES DAILY 12/16/14  Yes Marcial Pacas, MD  gabapentin (NEURONTIN) 300 MG capsule TAKE ONE CAPSULE BY MOUTH THREE TIMES DAILY 11/10/14  Yes Marcial Pacas, MD  ibuprofen (ADVIL,MOTRIN) 200 MG tablet Take 800 mg by mouth at bedtime as needed for moderate pain.    Yes Historical Provider, MD  metFORMIN (GLUCOPHAGE-XR) 500 MG 24 hr tablet Take 500 mg by mouth every evening.   Yes Historical Provider, MD  methylphenidate (RITALIN) 10 MG tablet Take 1 tablet (10 mg total) by mouth 3 (three) times daily with meals. Do not take last dose any later than 4pm 03/09/15  Yes Marcial Pacas, MD  natalizumab (TYSABRI) 300 MG/15ML injection Inject 15 mLs (300 mg total) into the vein every 30 (thirty) days. 08/04/14  Yes Marcial Pacas, MD  traZODone (DESYREL) 50 MG tablet Take 50 mg by  mouth at bedtime.   Yes Historical Provider, MD     Social History   Social History  . Marital Status: Married    Spouse Name: N/A  . Number of Children: 4  . Years of Education: college   Occupational History  . RN    Social History Main Topics  . Smoking status: Former Smoker -- 1.50 packs/day for 15 years    Types: Cigarettes    Quit date: 07/04/2003  . Smokeless tobacco: Never Used  . Alcohol Use: 0.6 oz/week    1 Glasses of wine per week  . Drug Use: No  . Sexual Activity:    Partners: Male    Birth Control/ Protection: Surgical     Comment: vasectomy   Other Topics Concern  . Not on file   Social History  Narrative   Patient lives at home with her husband and children. Patient works for the [employer] in [place]. College education.     Family Status  Relation Status Death Age  . Mother Alive   . Father Deceased   . Sister Alive   . Maternal Aunt Alive   . Brother Alive   . Sister Alive   . Brother Alive    Family History  Problem Relation Age of Onset  . Diabetes Mother   . Hypertension Mother   . Hypertension Father   . Atrial fibrillation Father   . Diabetes Sister   . Breast cancer Maternal Aunt   . Arrhythmia Sister       ROS:  Full 14 point review of systems complete and found to be negative unless listed above.  Physical Exam: Blood pressure 109/54, pulse 75, temperature 97.6 F (36.4 C), temperature source Oral, resp. rate 16, height 5\' 3"  (1.6 m), weight 228 lb (103.42 kg), last menstrual period 10/03/2014, SpO2 98 %.  General: Well developed, well nourished, female in no acute distress Head: Eyes PERRLA, No xanthomas. Normocephalic and atraumatic, oropharynx without edema or exudate.  Lungs: Resp regular and unlabored, CTA. Heart: RRR no s3, s4, or murmurs..   Neck: No carotid bruits. No lymphadenopathy.  No JVD. Abdomen: Bowel sounds present, abdomen soft and non-tender without masses or hernias noted. Msk:  No spine or cva tenderness. No weakness, no joint deformities or effusions. Extremities: No clubbing, cyanosis or edema. DP/PT/Radials 2+ and equal bilaterally. Neuro: Alert and oriented X 3. No focal deficits noted. Psych:  Good affect, responds appropriately Skin: No rashes or lesions noted.  Labs:   Lab Results  Component Value Date   WBC 7.7 04/05/2015   HGB 14.0 04/05/2015   HCT 40.5 04/05/2015   MCV 87.7 04/05/2015   PLT 230 04/05/2015   No results for input(s): INR in the last 72 hours.  Recent Labs Lab 04/05/15 0026  NA 137  K 3.9  CL 101  CO2 23  BUN 12  CREATININE 0.59  CALCIUM 8.8*  GLUCOSE 218*   No results found for:  MG  Recent Labs  04/05/15 0320  TROPONINI 0.15*    Recent Labs  04/05/15 0041  TROPIPOC 0.06    Echo: Pending  ECG: Vent. rate 74 BPM PR interval 151 ms QRS duration 92 ms QT/QTc 405/449 ms P-R-T axes 45 51 71  Radiology:  Dg Chest 2 View  04/05/2015  CLINICAL DATA:  50 year old female with chest pain EXAM: CHEST  2 VIEW COMPARISON:  Radiograph dated 07/02/2010 FINDINGS: The heart size and mediastinal contours are within normal limits. Both lungs are  clear. The visualized skeletal structures are unremarkable. IMPRESSION: No active cardiopulmonary disease. Electronically Signed   By: Anner Crete M.D.   On: 04/05/2015 01:25    ASSESSMENT AND PLAN:     1. Unstable angina - She has classic symptoms. Responsive to nitro. Currently chest pain free. EKG withtou acute findings. POc troponin 0.06.  Trop I of 0.15. Cardiac risk factor includes DM, former smoker and obesity.  - Continue IV heparin and nitro patch. - Echo pending. Will get Lipid panel and TSH. Will start high dose statin and check LFTs. Likely patient needs a cath. Will review with MD.  The patient understands that risks include but are not limited to stroke (1 in 1000), death (1 in 1000), kidney failure [usually temporary] (1 in 500), bleeding (1 in 200), allergic reaction [possibly serious] (1 in 200), and agrees to proceed.   2. DM - A1c of 8.1 01/19/2015. - per primary  Signed: Bhagat,Bhavinkumar, [place] 04/05/2015, 7:57 AM Pager [phone]  Co-Sign MD  Agree with note by Robbie Lis PA-C  + CRF. Sx C/W [place]. Mildly + Trop. EKG w/o acute changes. Exam benign. For Cor angio today. Good RRA pulse. The patient understands that risks included but are not limited to stroke (1 in 1000), death (1 in 75), kidney failure [usually temporary] (1 in 500), bleeding (1 in 200), allergic reaction [possibly serious] (1 in 200). The patient understands and agrees to proceed  Lorretta Harp, M.D., [place], [facility], [place],  [place] [address] [place], [place]  [address]  [phone] 04/05/2015 9:26 AM

## 2015-04-05 NOTE — H&P (Signed)
[facility] History and Physical  MARQUIE EXCELL [id number] DOB: 1966-01-27 DOA: 04/05/2015  Referring physician: Dr. Roxanne Mins. PCP: Jonathon Bellows, MD  Specialists: Neurologist for multiple sclerosis.  Chief Complaint: Chest pain.  HPI: Karrin A Winsett is a 50 y.o. female with history of multiple sclerosis and recently diagnosed diabetes mellitus presents to the ER because of chest pain. Patient's chest pain started after shoveling the snow. It happened 3 days ago. Patient's chest pain is only on exertion. Has happened at least 4-5 times over the last 3 days. Last night when patient started experience chest pain EMS was called and patient was given sublingual nitroglycerin following which patient's chest pain improved. EKG shows nonspecific changes. Chest x-ray does not show anything acute. Patient has been admitted for chest pain concerning for unstable angina. Presently chest pain-free. Patient has been started on heparin.   Review of Systems: As presented in the history of presenting illness, rest negative.  Past Medical History  Diagnosis Date  . Multiple sclerosis ([place])   . Fatigue   . Abnormal Pap smear 2004    HPV, cone biopsy  . DM (diabetes mellitus), gestational 2006    insulin at the time  . IBS (irritable bowel syndrome)     with chronic constipation  . DDD (degenerative disc disease), lumbar    Past Surgical History  Procedure Laterality Date  . Cholecystectomy      1988  . Cesarean section      1996, 2006  . Colposcopy  2004  . Cervical cone biopsy  2004   Social History:  reports that she quit smoking about 11 years ago. Her smoking use included Cigarettes. She has a 22.5 pack-year smoking history. She has never used smokeless tobacco. She reports that she drinks about 0.6 oz of alcohol per week. She reports that she does not use illicit drugs. Where does patient live at home. Can patient participate in ADLs? Yes.  No Known Allergies  Family History:   Family History  Problem Relation Age of Onset  . Diabetes Mother   . Hypertension Mother   . Hypertension Father   . Atrial fibrillation Father   . Diabetes Sister   . Breast cancer Maternal Aunt       Prior to Admission medications   Medication Sig Start Date End Date Taking? Authorizing Provider  baclofen (LIORESAL) 10 MG tablet TAKE ONE TABLET BY MOUTH THREE TIMES DAILY 12/16/14  Yes Marcial Pacas, MD  gabapentin (NEURONTIN) 300 MG capsule TAKE ONE CAPSULE BY MOUTH THREE TIMES DAILY 11/10/14  Yes Marcial Pacas, MD  ibuprofen (ADVIL,MOTRIN) 200 MG tablet Take 800 mg by mouth at bedtime as needed for moderate pain.    Yes Historical Provider, MD  metFORMIN (GLUCOPHAGE-XR) 500 MG 24 hr tablet Take 500 mg by mouth every evening.   Yes Historical Provider, MD  methylphenidate (RITALIN) 10 MG tablet Take 1 tablet (10 mg total) by mouth 3 (three) times daily with meals. Do not take last dose any later than 4pm 03/09/15  Yes Marcial Pacas, MD  natalizumab (TYSABRI) 300 MG/15ML injection Inject 15 mLs (300 mg total) into the vein every 30 (thirty) days. 08/04/14  Yes Marcial Pacas, MD  traZODone (DESYREL) 50 MG tablet Take 50 mg by mouth at bedtime.   Yes Historical Provider, MD    Physical Exam: Filed Vitals:   04/05/15 0115 04/05/15 0130 04/05/15 0145 04/05/15 0230  BP: 119/76 110/83 120/74 124/80  Pulse: 79 74 73 87  Temp:  TempSrc:      Resp: 15  20 20   Height:      Weight:      SpO2: 96% 97% 97% 98%     General:  Moderately built and nourished.  Eyes: Anicteric no pallor.  ENT: No discharge from the ears eyes nose or mouth.  Neck: No mass felt.  Cardiovascular: S1 and S2 heard.  Respiratory: No rhonchi or crepitations.  Abdomen: Soft nontender bowel sounds present.  Skin: No rash.  Musculoskeletal: No edema.  Psychiatric: Appears normal.  Neurologic: Alert awake oriented to time place and person. Moves all extremities.  Labs on Admission:  Basic Metabolic Panel:  Recent  Labs Lab 04/05/15 0026  NA 137  K 3.9  CL 101  CO2 23  GLUCOSE 218*  BUN 12  CREATININE 0.59  CALCIUM 8.8*   Liver Function Tests: No results for input(s): AST, ALT, ALKPHOS, BILITOT, PROT, ALBUMIN in the last 168 hours. No results for input(s): LIPASE, AMYLASE in the last 168 hours. No results for input(s): AMMONIA in the last 168 hours. CBC:  Recent Labs Lab 04/05/15 0026  WBC 7.7  NEUTROABS 3.2  HGB 14.0  HCT 40.5  MCV 87.7  PLT 230   Cardiac Enzymes: No results for input(s): CKTOTAL, CKMB, CKMBINDEX, TROPONINI in the last 168 hours.  BNP (last 3 results) No results for input(s): BNP in the last 8760 hours.  ProBNP (last 3 results) No results for input(s): PROBNP in the last 8760 hours.  CBG: No results for input(s): GLUCAP in the last 168 hours.  Radiological Exams on Admission: Dg Chest 2 View  04/05/2015  CLINICAL DATA:  50 year old female with chest pain EXAM: CHEST  2 VIEW COMPARISON:  Radiograph dated 07/02/2010 FINDINGS: The heart size and mediastinal contours are within normal limits. Both lungs are clear. The visualized skeletal structures are unremarkable. IMPRESSION: No active cardiopulmonary disease. Electronically Signed   By: Anner Crete M.D.   On: 04/05/2015 01:25    EKG: Independently reviewed. Normal sinus rhythm.  Assessment/Plan Principal Problem:   Chest pain   1. Chest pain concerning for unstable angina - patient has been started on heparin infusion will be continued. Nitroglycerin patch. Aspirin. Check 2-D echo. Keep patient nothing by mouth in anticipation of possible cardiac procedure. 2. Recently diagnosed diabetes mellitus type 2 - will hold metformin in anticipation of procedure. Patient has been placed on sliding scale coverage. 3. History of multiple sclerosis - will continue present medications.   DVT Prophylaxis heparin infusion.  Code Status: Full code.  Family Communication: Family at the bedside.  Disposition Plan:  Admit for observation.    KAKRAKANDY,ARSHAD N. [facility] Pager [phone].  If 7PM-7AM, please contact night-coverage [email or web address] Password [email or web address] 04/05/2015, 2:38 AM

## 2015-04-05 NOTE — Progress Notes (Signed)
ANTICOAGULATION CONSULT NOTE - Follow Up Consult  Pharmacy Consult for Heparin Indication: chest pain/ACS  No Known Allergies  Patient Measurements: Height: 5\' 3"  (160 cm) Weight: 228 lb (103.42 kg) IBW/kg (Calculated) : 52.4 Heparin Dosing Weight: 80 kg  Vital Signs: Temp: 97.6 F (36.4 C) (01/10 0740) Temp Source: Oral (01/10 0740) BP: 109/54 mmHg (01/10 0740) Pulse Rate: 75 (01/10 0317)  Labs:  Recent Labs  04/05/15 0026 04/05/15 0320 04/05/15 0810 04/05/15 0850  HGB 14.0  --   --   --   HCT 40.5  --   --   --   PLT 230  --   --   --   HEPARINUNFRC  --   --  0.49  --   CREATININE 0.59  --   --   --   TROPONINI  --  0.15*  --  0.30*    Estimated Creatinine Clearance: 98.8 mL/min (by C-G formula based on Cr of 0.59).  Assessment:    Initial heparin level is therapeutic (0.49) on 1250 units/hr.    For cardiac cath today.  Goal of Therapy:  Heparin level 0.3-0.7 units/ml Monitor platelets by anticoagulation protocol: Yes   Plan:   Continue Heparin drip at 1250 units/hr.  Daily heparin level and CBC while on heparin.  Follow up post-cath.  Arty Baumgartner, [place] Pager: [phone] 04/05/2015,9:47 AM

## 2015-04-05 NOTE — Consult Note (Signed)
CARDIOLOGY CONSULT NOTE   Patient ID: Kaylee Bryant MRN: [id number] DOB/AGE: 49-Jul-1967 49 y.o.  Admit date: 04/05/2015  Primary Physician   Jonathon Bellows, MD Primary Cardiologist   New Reason for Consultation   Chest pain Requesting Physician Dr. Hal Hope  HPI: [name] is a 50 y.o. female with a history of DM, IBD, DDD of lumbar, former smoker (quit 2005)  and multiple sclerosis who admitted with symptoms concerning for unstable angina.  No prior cardiac hx. The patient has been having substernal chest tightness/pressue for the past 2-3 days. The first episode occurred while putting a bed sheet. Her pain occurs with minimal exertion and relieves with rest in 5 mins. The chest pain radiates to her jaw and neck. She did not shoveled the snow. Last night she woke up from sleep with 10/10 chest tightness associated with diaphoresis, SOB and nausea. EMS was called, given 325mg  aspirin and relief with SL nitro x 2. The patient denies vomiting, fever, palpitations, orthopnea, PND, dizziness, syncope, cough, congestion, abdominal pain, hematochezia, melena, lower extremity edema.  EKG without acute findings. POc troponin 0.06. Trop I of 0.15. Started IV heparin and nitro patch. CXR clear. Currently chest pain free. Dad has CHF, HTN and DM.    Past Medical History  Diagnosis Date  . Multiple sclerosis ([place])   . Fatigue   . Abnormal Pap smear 2004    HPV, cone biopsy  . DM (diabetes mellitus), gestational 2006    insulin at the time  . IBS (irritable bowel syndrome)     with chronic constipation  . DDD (degenerative disc disease), lumbar      Past Surgical History  Procedure Laterality Date  . Cholecystectomy      1988  . Cesarean section      1996, 2006  . Colposcopy  2004  . Cervical cone biopsy  2004    No Known Allergies  I have reviewed the patient's current medications . aspirin EC  325 mg Oral Daily  . baclofen  10 mg Oral TID  . gabapentin  300 mg Oral TID  .  insulin aspart  0-9 Units Subcutaneous TID WC  . methylphenidate  10 mg Oral TID WC  . nitroGLYCERIN  0.4 mg Transdermal Daily  . [START ON 04/06/2015] pneumococcal 23 valent vaccine  0.5 mL Intramuscular Tomorrow-1000  . traZODone  50 mg Oral QHS   . heparin 1,250 Units/hr (04/05/15 0106)   acetaminophen, morphine injection, ondansetron (ZOFRAN) IV  Prior to Admission medications   Medication Sig Start Date End Date Taking? Authorizing Provider  baclofen (LIORESAL) 10 MG tablet TAKE ONE TABLET BY MOUTH THREE TIMES DAILY 12/16/14  Yes Marcial Pacas, MD  gabapentin (NEURONTIN) 300 MG capsule TAKE ONE CAPSULE BY MOUTH THREE TIMES DAILY 11/10/14  Yes Marcial Pacas, MD  ibuprofen (ADVIL,MOTRIN) 200 MG tablet Take 800 mg by mouth at bedtime as needed for moderate pain.    Yes Historical Provider, MD  metFORMIN (GLUCOPHAGE-XR) 500 MG 24 hr tablet Take 500 mg by mouth every evening.   Yes Historical Provider, MD  methylphenidate (RITALIN) 10 MG tablet Take 1 tablet (10 mg total) by mouth 3 (three) times daily with meals. Do not take last dose any later than 4pm 03/09/15  Yes Marcial Pacas, MD  natalizumab (TYSABRI) 300 MG/15ML injection Inject 15 mLs (300 mg total) into the vein every 30 (thirty) days. 08/04/14  Yes Marcial Pacas, MD  traZODone (DESYREL) 50 MG tablet Take 50 mg by  mouth at bedtime.   Yes Historical Provider, MD     Social History   Social History  . Marital Status: Married    Spouse Name: N/A  . Number of Children: 4  . Years of Education: college   Occupational History  . RN    Social History Main Topics  . Smoking status: Former Smoker -- 1.50 packs/day for 15 years    Types: Cigarettes    Quit date: 07/04/2003  . Smokeless tobacco: Never Used  . Alcohol Use: 0.6 oz/week    1 Glasses of wine per week  . Drug Use: No  . Sexual Activity:    Partners: Male    Birth Control/ Protection: Surgical     Comment: vasectomy   Other Topics Concern  . Not on file   Social History  Narrative   Patient lives at home with her husband and children. Patient works for the [employer] in [place]. College education.     Family Status  Relation Status Death Age  . Mother Alive   . Father Deceased   . Sister Alive   . Maternal Aunt Alive   . Brother Alive   . Sister Alive   . Brother Alive    Family History  Problem Relation Age of Onset  . Diabetes Mother   . Hypertension Mother   . Hypertension Father   . Atrial fibrillation Father   . Diabetes Sister   . Breast cancer Maternal Aunt   . Arrhythmia Sister       ROS:  Full 14 point review of systems complete and found to be negative unless listed above.  Physical Exam: Blood pressure 109/54, pulse 75, temperature 97.6 F (36.4 C), temperature source Oral, resp. rate 16, height 5\' 3"  (1.6 m), weight 228 lb (103.42 kg), last menstrual period 10/03/2014, SpO2 98 %.  General: Well developed, well nourished, female in no acute distress Head: Eyes PERRLA, No xanthomas. Normocephalic and atraumatic, oropharynx without edema or exudate.  Lungs: Resp regular and unlabored, CTA. Heart: RRR no s3, s4, or murmurs..   Neck: No carotid bruits. No lymphadenopathy.  No JVD. Abdomen: Bowel sounds present, abdomen soft and non-tender without masses or hernias noted. Msk:  No spine or cva tenderness. No weakness, no joint deformities or effusions. Extremities: No clubbing, cyanosis or edema. DP/PT/Radials 2+ and equal bilaterally. Neuro: Alert and oriented X 3. No focal deficits noted. Psych:  Good affect, responds appropriately Skin: No rashes or lesions noted.  Labs:   Lab Results  Component Value Date   WBC 7.7 04/05/2015   HGB 14.0 04/05/2015   HCT 40.5 04/05/2015   MCV 87.7 04/05/2015   PLT 230 04/05/2015   No results for input(s): INR in the last 72 hours.  Recent Labs Lab 04/05/15 0026  NA 137  K 3.9  CL 101  CO2 23  BUN 12  CREATININE 0.59  CALCIUM 8.8*  GLUCOSE 218*   No results found for:  MG  Recent Labs  04/05/15 0320  TROPONINI 0.15*    Recent Labs  04/05/15 0041  TROPIPOC 0.06    Echo: Pending  ECG: Vent. rate 74 BPM PR interval 151 ms QRS duration 92 ms QT/QTc 405/449 ms P-R-T axes 45 51 71  Radiology:  Dg Chest 2 View  04/05/2015  CLINICAL DATA:  50 year old female with chest pain EXAM: CHEST  2 VIEW COMPARISON:  Radiograph dated 07/02/2010 FINDINGS: The heart size and mediastinal contours are within normal limits. Both lungs are  clear. The visualized skeletal structures are unremarkable. IMPRESSION: No active cardiopulmonary disease. Electronically Signed   By: Anner Crete M.D.   On: 04/05/2015 01:25    ASSESSMENT AND PLAN:     1. Unstable angina - She has classic symptoms. Responsive to nitro. Currently chest pain free. EKG withtou acute findings. POc troponin 0.06.  Trop I of 0.15. Cardiac risk factor includes DM, former smoker and obesity.  - Continue IV heparin and nitro patch. - Echo pending. Will get Lipid panel and TSH. Will start high dose statin and check LFTs. Likely patient needs a cath. Will review with MD.  The patient understands that risks include but are not limited to stroke (1 in 1000), death (1 in 1000), kidney failure [usually temporary] (1 in 500), bleeding (1 in 200), allergic reaction [possibly serious] (1 in 200), and agrees to proceed.   2. DM - A1c of 8.1 01/19/2015. - per primary  Signed: Bhagat,Bhavinkumar, [place] 04/05/2015, 7:57 AM Pager [phone]  Co-Sign MD  Agree with note by Robbie Lis PA-C  + CRF. Sx C/W [place]. Mildly + Trop. EKG w/o acute changes. Exam benign. For Cor angio today. Good RRA pulse. The patient understands that risks included but are not limited to stroke (1 in 1000), death (1 in 79), kidney failure [usually temporary] (1 in 500), bleeding (1 in 200), allergic reaction [possibly serious] (1 in 200). The patient understands and agrees to proceed  Lorretta Harp, M.D., [place], [facility], [place],  [place] [address] [place], [place]  [address]  [phone] 04/05/2015 9:26 AM

## 2015-04-05 NOTE — Progress Notes (Signed)
  ANTICOAGULATION CONSULT NOTE - Initial Consult  Pharmacy Consult for Heparin Indication: chest pain/ACS  No Known Allergies  Patient Measurements: Height: 5\' 3"  (160 cm) Weight: 231 lb (104.781 kg) IBW/kg (Calculated) : 52.4 Heparin Dosing Weight: 80 kg  Vital Signs: Temp: 97.5 F (36.4 C) (01/10 0014) Temp Source: Oral (01/10 0014) BP: 116/76 mmHg (01/10 0014) Pulse Rate: 75 (01/10 0014)  Labs: No results for input(s): HGB, HCT, PLT, APTT, LABPROT, INR, HEPARINUNFRC, CREATININE, CKTOTAL, CKMB, TROPONINI in the last 72 hours.  CrCl cannot be calculated (Patient has no serum creatinine result on file.).   Medical History: Past Medical History  Diagnosis Date  . Multiple sclerosis ([place])   . Fatigue   . Abnormal Pap smear 2004    HPV, cone biopsy  . DM (diabetes mellitus), gestational 2006    insulin at the time  . IBS (irritable bowel syndrome)     with chronic constipation  . DDD (degenerative disc disease), lumbar     Medications:  Baclofen  Neurontin  Ritalin  Tysabri  Ibuprofen  Assessment: 50 y.o. female with chest pain for heparin  Goal of Therapy:  Heparin level 0.3-0.7 units/ml Monitor platelets by anticoagulation protocol: Yes   Plan:  Heparin 4000 units IV bolus, then start heparin 1250 units/hr Check heparin level in 6 hours.   Abbott, [place] 04/05/2015,12:42 AM

## 2015-04-05 NOTE — ED Notes (Signed)
Patient reports she took a 325mg  tab of aspirin prior to ems arrival today

## 2015-04-05 NOTE — ED Provider Notes (Signed)
CSN: [id number]     Arrival date & time 04/05/15  0007 History  By signing my name below, I, Altamease Oiler, attest that this documentation has been prepared under the direction and in the presence of Delora Fuel, MD. Electronically Signed: Altamease Oiler, ED Scribe. 04/05/2015. 2:25 AM   Chief Complaint  Patient presents with  . Chest Pain    The history is provided by the patient. No language interpreter was used.   Brought in by EMS, Kaylee Bryant is a 50 y.o. female with PMHx of DM and MS who presents to the Emergency Department complaining of intermittent, heavy,10/10 in severity mid sternal chest pain with onset 2 days ago. Pt notes that the pain radiates to the neck and jaw. The 5 minute episodes are elicited with exertion, yesterday with shoveling snow, and today just from walking from one room to another. Tonight, she had an episode of pain which woke her from sleep. She took 325 mg of aspirin at home and NTG was given by EMS. Associated symptoms include nausea and sweating. Pt denies SOB. She denies alcohol and tobacco use. No history of HTN or high cholesterol. Her father had CHF in his 37s but she denies other relevant family medical history.   Past Medical History  Diagnosis Date  . Multiple sclerosis ([place])   . Fatigue   . Abnormal Pap smear 2004    HPV, cone biopsy  . DM (diabetes mellitus), gestational 2006    insulin at the time  . IBS (irritable bowel syndrome)     with chronic constipation  . DDD (degenerative disc disease), lumbar    Past Surgical History  Procedure Laterality Date  . Cholecystectomy      1988  . Cesarean section      1996, 2006  . Colposcopy  2004  . Cervical cone biopsy  2004   Family History  Problem Relation Age of Onset  . Diabetes Mother   . Hypertension Mother   . Hypertension Father   . Atrial fibrillation Father   . Diabetes Sister   . Breast cancer Maternal Aunt    Social History  Substance Use Topics  . Smoking status:  Former Smoker -- 1.50 packs/day for 15 years    Types: Cigarettes    Quit date: 07/04/2003  . Smokeless tobacco: Never Used  . Alcohol Use: 0.6 oz/week    1 Glasses of wine per week   OB History    Gravida Para Term Preterm AB TAB SAB Ectopic Multiple Living   6 4 4  2 1 1   4      Review of Systems  Constitutional: Positive for diaphoresis.  Respiratory: Negative for shortness of breath.   Cardiovascular: Positive for chest pain.  Gastrointestinal: Positive for nausea. Negative for vomiting.  All other systems reviewed and are negative.  Allergies  Review of patient's allergies indicates no known allergies.  Home Medications   Prior to Admission medications   Medication Sig Start Date End Date Taking? Authorizing Provider  baclofen (LIORESAL) 10 MG tablet TAKE ONE TABLET BY MOUTH THREE TIMES DAILY 12/16/14  Yes Marcial Pacas, MD  gabapentin (NEURONTIN) 300 MG capsule TAKE ONE CAPSULE BY MOUTH THREE TIMES DAILY 11/10/14  Yes Marcial Pacas, MD  ibuprofen (ADVIL,MOTRIN) 200 MG tablet Take 800 mg by mouth at bedtime as needed for moderate pain.    Yes Historical Provider, MD  metFORMIN (GLUCOPHAGE-XR) 500 MG 24 hr tablet Take 500 mg by mouth every evening.  Yes Historical Provider, MD  methylphenidate (RITALIN) 10 MG tablet Take 1 tablet (10 mg total) by mouth 3 (three) times daily with meals. Do not take last dose any later than 4pm 03/09/15  Yes Marcial Pacas, MD  natalizumab (TYSABRI) 300 MG/15ML injection Inject 15 mLs (300 mg total) into the vein every 30 (thirty) days. 08/04/14  Yes Marcial Pacas, MD  traZODone (DESYREL) 50 MG tablet Take 50 mg by mouth at bedtime.   Yes Historical Provider, MD   BP 116/76 mmHg  Pulse 75  Temp(Src) 97.5 F (36.4 C) (Oral)  Resp 13  Ht 5\' 3"  (1.6 m)  Wt 231 lb (104.781 kg)  BMI 40.93 kg/m2  SpO2 98%  LMP  (LMP Unknown) Physical Exam  Constitutional: She is oriented to person, place, and time. She appears well-developed and well-nourished. No distress.   HENT:  Head: Normocephalic and atraumatic.  Eyes: EOM are normal. Pupils are equal, round, and reactive to light.  Neck: Normal range of motion. Neck supple. No JVD present.  Cardiovascular: Normal rate, regular rhythm and normal heart sounds.   No murmur heard. Pulmonary/Chest: Effort normal and breath sounds normal. She has no wheezes. She has no rales. She exhibits no tenderness.  Abdominal: Soft. Bowel sounds are normal. She exhibits no distension and no mass. There is no tenderness.  Musculoskeletal: Normal range of motion.  Lymphadenopathy:    She has no cervical adenopathy.  Neurological: She is alert and oriented to person, place, and time. No cranial nerve deficit. She exhibits normal muscle tone. Coordination normal.  Skin: Skin is warm and dry. No rash noted.  Psychiatric: She has a normal mood and affect. Her behavior is normal. Judgment and thought content normal.  Nursing note and vitals reviewed.   ED Course  Procedures (including critical care time) DIAGNOSTIC STUDIES: Oxygen Saturation is 98% on RA,  normal by my interpretation.    COORDINATION OF CARE: 12:34 AM Discussed treatment plan which includes lab work, CXR, EKG, heparin, and NTG with pt at bedside and pt agreed to plan.  2:23 AM-Consult complete with Hospitalist. Patient case explained and discussed. Call ended at 2:24 AM   Labs Review Results for orders placed or performed during the hospital encounter of [id number]  Basic metabolic panel  Result Value Ref Range   Sodium 137 135 - 145 mmol/L   Potassium 3.9 3.5 - 5.1 mmol/L   Chloride 101 101 - 111 mmol/L   CO2 23 22 - 32 mmol/L   Glucose, Bld 218 (H) 65 - 99 mg/dL   BUN 12 6 - 20 mg/dL   Creatinine, Ser 0.59 0.44 - 1.00 mg/dL   Calcium 8.8 (L) 8.9 - 10.3 mg/dL   GFR calc non Af Amer >60 >60 mL/min   GFR calc Af Amer >60 >60 mL/min   Anion gap 13 5 - 15  CBC  Result Value Ref Range   WBC 7.7 4.0 - 10.5 K/uL   RBC 4.62 3.87 - 5.11 MIL/uL    Hemoglobin 14.0 12.0 - 15.0 g/dL   HCT 40.5 36.0 - 46.0 %   MCV 87.7 78.0 - 100.0 fL   MCH 30.3 26.0 - 34.0 pg   MCHC 34.6 30.0 - 36.0 g/dL   RDW 14.1 11.5 - 15.5 %   Platelets 230 150 - 400 K/uL  Differential  Result Value Ref Range   Neutrophils Relative % 41 %   Neutro Abs 3.2 1.7 - 7.7 K/uL   Lymphocytes Relative 47 %  Lymphs Abs 3.6 0.7 - 4.0 K/uL   Monocytes Relative 8 %   Monocytes Absolute 0.6 0.1 - 1.0 K/uL   Eosinophils Relative 3 %   Eosinophils Absolute 0.2 0.0 - 0.7 K/uL   Basophils Relative 1 %   Basophils Absolute 0.1 0.0 - 0.1 K/uL  I-stat troponin, ED (not at [facility], [facility])  Result Value Ref Range   Troponin i, poc 0.06 0.00 - 0.08 ng/mL   Comment 3           Imaging Review Dg Chest 2 View  04/05/2015  CLINICAL DATA:  50 year old female with chest pain EXAM: CHEST  2 VIEW COMPARISON:  Radiograph dated 07/02/2010 FINDINGS: The heart size and mediastinal contours are within normal limits. Both lungs are clear. The visualized skeletal structures are unremarkable. IMPRESSION: No active cardiopulmonary disease. Electronically Signed   By: Anner Crete M.D.   On: 04/05/2015 01:25   I have personally reviewed and evaluated these images and lab results as part of my medical decision-making.   EKG Interpretation   Date/Time:  Tuesday April 05 2015 00:17:34 EST Ventricular Rate:  74 PR Interval:  151 QRS Duration: 92 QT Interval:  405 QTC Calculation: 449 R Axis:   51 Text Interpretation:  Sinus rhythm Anteroseptal infarct, age indeterminate  When compared with ECG of 07/02/2010, No significant change was found  Confirmed by [facility]  MD, DAVID ([id number]) on 04/05/2015 12:22:19 AM      MDM   Final diagnoses:  Chest pain, unspecified chest pain type    Chest pain in a pattern suggestive of unstable angina. No ECG changes to suggest acute MI. She started on heparin and topical nitrates. She started taking aspirin. She will need to be admitted. Case is discussed with  Dr. Hal Hope of [facility] who agrees to admit the patient.  I personally performed the services described in this documentation, which was scribed in my presence. The recorded information has been reviewed and is accurate.       Delora Fuel, MD [id number]

## 2015-04-05 NOTE — ED Notes (Signed)
Per EMS, the patient reports having chest pain in mid sternal area, radiating to jaw and neck. No hx of MI. Normal sinus on monitor, unremarkable ekg. 2 nitro tabs with relief, patient took aspirin prior to ems arrival. 18g in R A/C. 124/81, p 78, 98% on room air, rr 16 with ems.

## 2015-04-05 NOTE — ED Notes (Signed)
Patient gone to xray 

## 2015-04-05 NOTE — Progress Notes (Signed)
ANTICOAGULATION CONSULT NOTE - Follow Up Consult  Pharmacy Consult for Heparin Indication: CVTS consult s/p cath   No Known Allergies  Patient Measurements: Height: 5\' 3"  (160 cm) Weight: 228 lb (103.42 kg) IBW/kg (Calculated) : 52.4 Heparin Dosing Weight: 80 kg  Vital Signs: Temp: 97.8 F (36.6 C) (01/10 1132) Temp Source: Oral (01/10 1132) BP: 126/56 mmHg (01/10 1910) Pulse Rate: 78 (01/10 1910)  Labs:  Recent Labs  04/05/15 0026 04/05/15 0320 04/05/15 0810 04/05/15 0850 04/05/15 1058  HGB 14.0  --   --   --   --   HCT 40.5  --   --   --   --   PLT 230  --   --   --   --   LABPROT  --   --   --   --  14.6  INR  --   --   --   --  1.13  HEPARINUNFRC  --   --  0.49  --   --   CREATININE 0.59  --   --   --   --   TROPONINI  --  0.15*  --  0.30*  --     Estimated Creatinine Clearance: 98.8 mL/min (by C-G formula based on Cr of 0.59).  Assessment:    Initial heparin level is therapeutic (0.49) on 1250 units/hr. Now s/p cardiac cath to resume heparin 8 hours post sheath removal. CVTS consult pending.       Goal of Therapy:  Heparin level 0.3-0.7 units/ml Monitor platelets by anticoagulation protocol: Yes   Plan:   Resume Heparin drip at 1250 units/hr 0015 on 1/11 (8 hours post sheath removal). NO BOLUS   Daily heparin level and CBC while on heparin.  Follow up post-cath.  Albertina Parr, PharmD., BCPS Clinical Pharmacist Pager [phone]

## 2015-04-05 NOTE — Interval H&P Note (Signed)
Cath Lab Visit (complete for each Cath Lab visit)  Clinical Evaluation Leading to the Procedure:   ACS: Yes.    Non-ACS:    Anginal Classification: CCS IV  Anti-ischemic medical therapy: Maximal Therapy (2 or more classes of medications)  Non-Invasive Test Results: No non-invasive testing performed  Prior CABG: No previous CABG      History and Physical Interval Note:  04/05/2015 3:08 PM  Kaylee Bryant  has presented today for surgery, with the diagnosis of cp  The various methods of treatment have been discussed with the patient and family. After consideration of risks, benefits and other options for treatment, the patient has consented to  Procedure(s): Left Heart Cath and Coronary Angiography (N/A) as a surgical intervention .  The patient's history has been reviewed, patient examined, no change in status, stable for surgery.  I have reviewed the patient's chart and labs.  Questions were answered to the patient's satisfaction.     KELLY,THOMAS A

## 2015-04-05 NOTE — ED Notes (Signed)
NPO at 0400 per Dr. Hal Hope, hospitalist.

## 2015-04-06 ENCOUNTER — Inpatient Hospital Stay (HOSPITAL_COMMUNITY): Payer: Managed Care, Other (non HMO)

## 2015-04-06 ENCOUNTER — Encounter (HOSPITAL_COMMUNITY): Payer: Self-pay | Admitting: Cardiovascular Disease

## 2015-04-06 DIAGNOSIS — R079 Chest pain, unspecified: Secondary | ICD-10-CM

## 2015-04-06 DIAGNOSIS — I25110 Atherosclerotic heart disease of native coronary artery with unstable angina pectoris: Secondary | ICD-10-CM

## 2015-04-06 DIAGNOSIS — I214 Non-ST elevation (NSTEMI) myocardial infarction: Principal | ICD-10-CM

## 2015-04-06 DIAGNOSIS — E669 Obesity, unspecified: Secondary | ICD-10-CM | POA: Diagnosis present

## 2015-04-06 DIAGNOSIS — I2510 Atherosclerotic heart disease of native coronary artery without angina pectoris: Secondary | ICD-10-CM

## 2015-04-06 LAB — COMPREHENSIVE METABOLIC PANEL
ALT: 75 U/L — ABNORMAL HIGH (ref 14–54)
ALT: 75 U/L — ABNORMAL HIGH (ref 14–54)
AST: 73 U/L — ABNORMAL HIGH (ref 15–41)
AST: 61 U/L — ABNORMAL HIGH (ref 15–41)
Albumin: 3.4 g/dL — ABNORMAL LOW (ref 3.5–5.0)
Albumin: 3.6 g/dL (ref 3.5–5.0)
Alkaline Phosphatase: 49 U/L (ref 38–126)
Alkaline Phosphatase: 55 U/L (ref 38–126)
Anion gap: 8 (ref 5–15)
Anion gap: 7 (ref 5–15)
BUN: 7 mg/dL (ref 6–20)
BUN: <5 mg/dL — ABNORMAL LOW (ref 6–20)
CO2: 24 mmol/L (ref 22–32)
CO2: 27 mmol/L (ref 22–32)
Calcium: 8.3 mg/dL — ABNORMAL LOW (ref 8.9–10.3)
Calcium: 8.9 mg/dL (ref 8.9–10.3)
Chloride: 107 mmol/L (ref 101–111)
Chloride: 107 mmol/L (ref 101–111)
Creatinine, Ser: 0.6 mg/dL (ref 0.44–1.00)
Creatinine, Ser: 0.71 mg/dL (ref 0.44–1.00)
GFR calc Af Amer: >60 mL/min (ref >60)
GFR calc Af Amer: >60 mL/min (ref >60)
GFR calc non Af Amer: >60 mL/min (ref >60)
GFR calc non Af Amer: >60 mL/min (ref >60)
Glucose, Bld: 138 mg/dL — ABNORMAL HIGH (ref 65–99)
Glucose, Bld: 162 mg/dL — ABNORMAL HIGH (ref 65–99)
Potassium: 3.7 mmol/L (ref 3.5–5.1)
Potassium: 3.8 mmol/L (ref 3.5–5.1)
Sodium: 139 mmol/L (ref 135–145)
Sodium: 141 mmol/L (ref 135–145)
Total Bilirubin: 0.7 mg/dL (ref 0.3–1.2)
Total Bilirubin: 0.8 mg/dL (ref 0.3–1.2)
Total Protein: 6.1 g/dL — ABNORMAL LOW (ref 6.5–8.1)
Total Protein: 6.5 g/dL (ref 6.5–8.1)

## 2015-04-06 LAB — URINALYSIS, ROUTINE W REFLEX MICROSCOPIC
Bilirubin Urine: NEGATIVE
Glucose, UA: NEGATIVE mg/dL
Hgb urine dipstick: NEGATIVE
Ketones, ur: NEGATIVE mg/dL
Leukocytes, UA: NEGATIVE
Nitrite: NEGATIVE
Protein, ur: NEGATIVE mg/dL
Specific Gravity, Urine: 1.005 (ref 1.005–1.030)
pH: 6 (ref 5.0–8.0)

## 2015-04-06 LAB — PULMONARY FUNCTION TEST
FEF 25-75 Post: 2.71 L/sec
FEF 25-75 Pre: 2.69 L/sec
FEF2575-%Change-Post: 0 %
FEF2575-%Pred-Post: 98 %
FEF2575-%Pred-Pre: 97 %
FEV1-%Change-Post: 0 %
FEV1-%Pred-Post: 90 %
FEV1-%Pred-Pre: 90 %
FEV1-Post: 2.49 L
FEV1-Pre: 2.47 L
FEV1FVC-%Change-Post: 3 %
FEV1FVC-%Pred-Pre: 100 %
FEV6-%Change-Post: -1 %
FEV6-%Pred-Post: 89 %
FEV6-%Pred-Pre: 90 %
FEV6-Post: 2.99 L
FEV6-Pre: 3.04 L
FEV6FVC-%Change-Post: 0 %
FEV6FVC-%Pred-Post: 103 %
FEV6FVC-%Pred-Pre: 102 %
FVC-%Change-Post: -2 %
FVC-%Pred-Post: 86 %
FVC-%Pred-Pre: 88 %
FVC-Post: 2.99 L
FVC-Pre: 3.06 L
Post FEV1/FVC ratio: 83 %
Post FEV6/FVC ratio: 100 %
Pre FEV1/FVC ratio: 81 %
Pre FEV6/FVC Ratio: 100 %

## 2015-04-06 LAB — BLOOD GAS, ARTERIAL
Acid-Base Excess: 0.6 mmol/L (ref 0.0–2.0)
Bicarbonate: 24.9 mEq/L — ABNORMAL HIGH (ref 20.0–24.0)
Drawn by: 295031
FIO2: 0.21
O2 Saturation: 93.9 %
Patient temperature: 98.6
TCO2: 26.2 mmol/L (ref 0–100)
pCO2 arterial: 41.5 mmHg (ref 35.0–45.0)
pH, Arterial: 7.396 (ref 7.350–7.450)
pO2, Arterial: 69.1 mmHg — ABNORMAL LOW (ref 80.0–100.0)

## 2015-04-06 LAB — CBC
HCT: 37.3 % (ref 36.0–46.0)
Hemoglobin: 12.5 g/dL (ref 12.0–15.0)
MCH: 29.8 pg (ref 26.0–34.0)
MCHC: 33.5 g/dL (ref 30.0–36.0)
MCV: 89 fL (ref 78.0–100.0)
Platelets: 209 10*3/uL (ref 150–400)
RBC: 4.19 MIL/uL (ref 3.87–5.11)
RDW: 14.2 % (ref 11.5–15.5)
WBC: 8.4 10*3/uL (ref 4.0–10.5)

## 2015-04-06 LAB — LIPID PANEL
Cholesterol: 159 mg/dL (ref 0–200)
HDL: 28 mg/dL — ABNORMAL LOW (ref >40)
LDL Cholesterol: 112 mg/dL — ABNORMAL HIGH (ref 0–99)
Total CHOL/HDL Ratio: 5.7 RATIO
Triglycerides: 94 mg/dL (ref <150)
VLDL: 19 mg/dL (ref 0–40)

## 2015-04-06 LAB — APTT: aPTT: 44 seconds — ABNORMAL HIGH (ref 24–37)

## 2015-04-06 LAB — HEPARIN LEVEL (UNFRACTIONATED)
Heparin Unfractionated: 0.24 IU/mL — ABNORMAL LOW (ref 0.30–0.70)
Heparin Unfractionated: 0.4 IU/mL (ref 0.30–0.70)

## 2015-04-06 LAB — GLUCOSE, CAPILLARY
Glucose-Capillary: 182 mg/dL — ABNORMAL HIGH (ref 65–99)
Glucose-Capillary: 186 mg/dL — ABNORMAL HIGH (ref 65–99)
Glucose-Capillary: 128 mg/dL — ABNORMAL HIGH (ref 65–99)
Glucose-Capillary: 133 mg/dL — ABNORMAL HIGH (ref 65–99)

## 2015-04-06 LAB — PREALBUMIN: Prealbumin: 20.6 mg/dL (ref 18–38)

## 2015-04-06 LAB — PROTIME-INR
INR: 1.19 (ref 0.00–1.49)
Prothrombin Time: 15.3 seconds — ABNORMAL HIGH (ref 11.6–15.2)

## 2015-04-06 LAB — TYPE AND SCREEN
ABO/RH(D): O POS
Antibody Screen: NEGATIVE

## 2015-04-06 LAB — SURGICAL PCR SCREEN
MRSA, PCR: NEGATIVE
Staphylococcus aureus: NEGATIVE

## 2015-04-06 LAB — ABO/RH: ABO/RH(D): O POS

## 2015-04-06 MED ORDER — CHLORHEXIDINE GLUCONATE 4 % EX LIQD
60.0000 mL | Freq: Once | CUTANEOUS | Status: AC
  Administered 2015-04-07: 4 via TOPICAL
  Filled 2015-04-06: qty 60

## 2015-04-06 MED ORDER — PHENYLEPHRINE HCL 10 MG/ML IJ SOLN
30.0000 ug/min | INTRAMUSCULAR | Status: DC
  Filled 2015-04-06: qty 2

## 2015-04-06 MED ORDER — CEFUROXIME SODIUM 1.5 G IJ SOLR
1.5000 g | INTRAMUSCULAR | Status: DC
  Filled 2015-04-06 (×2): qty 1.5

## 2015-04-06 MED ORDER — PNEUMOCOCCAL VAC POLYVALENT 25 MCG/0.5ML IJ INJ: 0.5000 mL | INJECTION | INTRAMUSCULAR | Status: DC | PRN

## 2015-04-06 MED ORDER — MAGNESIUM SULFATE 50 % IJ SOLN
40.0000 meq | INTRAMUSCULAR | Status: DC
  Filled 2015-04-06: qty 10

## 2015-04-06 MED ORDER — POTASSIUM CHLORIDE 2 MEQ/ML IV SOLN
80.0000 meq | INTRAVENOUS | Status: DC
  Filled 2015-04-06: qty 40

## 2015-04-06 MED ORDER — ALBUTEROL SULFATE (2.5 MG/3ML) 0.083% IN NEBU
2.5000 mg | INHALATION_SOLUTION | Freq: Once | RESPIRATORY_TRACT | Status: AC
  Administered 2015-04-06: 2.5 mg via RESPIRATORY_TRACT

## 2015-04-06 MED ORDER — DOPAMINE-DEXTROSE 3.2-5 MG/ML-% IV SOLN
0.0000 ug/kg/min | INTRAVENOUS | Status: DC
  Filled 2015-04-06: qty 250

## 2015-04-06 MED ORDER — TEMAZEPAM 15 MG PO CAPS: 15.0000 mg | ORAL_CAPSULE | Freq: Once | ORAL | Status: DC | PRN

## 2015-04-06 MED ORDER — BISACODYL 5 MG PO TBEC
5.0000 mg | DELAYED_RELEASE_TABLET | Freq: Once | ORAL | Status: AC
  Administered 2015-04-06: 5 mg via ORAL
  Filled 2015-04-06: qty 1

## 2015-04-06 MED ORDER — VANCOMYCIN HCL 10 G IV SOLR
1500.0000 mg | INTRAVENOUS | Status: DC
  Filled 2015-04-06 (×2): qty 1500

## 2015-04-06 MED ORDER — PLASMA-LYTE 148 IV SOLN
INTRAVENOUS | Status: DC
  Filled 2015-04-06: qty 2.5

## 2015-04-06 MED ORDER — SODIUM CHLORIDE 0.9 % IV SOLN
INTRAVENOUS | Status: DC
  Filled 2015-04-06: qty 40

## 2015-04-06 MED ORDER — METOPROLOL TARTRATE 12.5 MG HALF TABLET
12.5000 mg | ORAL_TABLET | Freq: Once | ORAL | Status: AC
  Administered 2015-04-07: 12.5 mg via ORAL
  Filled 2015-04-06: qty 1

## 2015-04-06 MED ORDER — SODIUM CHLORIDE 0.9 % IV SOLN
INTRAVENOUS | Status: DC
  Filled 2015-04-06: qty 30

## 2015-04-06 MED ORDER — EPINEPHRINE HCL 1 MG/ML IJ SOLN
0.0000 ug/min | INTRAVENOUS | Status: DC
  Filled 2015-04-06: qty 4

## 2015-04-06 MED ORDER — DEXMEDETOMIDINE HCL IN NACL 400 MCG/100ML IV SOLN
0.1000 ug/kg/h | INTRAVENOUS | Status: DC
  Filled 2015-04-06: qty 100

## 2015-04-06 MED ORDER — SODIUM CHLORIDE 0.9 % IV SOLN
INTRAVENOUS | Status: DC
  Filled 2015-04-06: qty 2.5

## 2015-04-06 MED ORDER — CHLORHEXIDINE GLUCONATE 4 % EX LIQD
60.0000 mL | Freq: Once | CUTANEOUS | Status: AC
  Administered 2015-04-06: 4 via TOPICAL
  Filled 2015-04-06: qty 60

## 2015-04-06 MED ORDER — DEXTROSE 5 % IV SOLN
750.0000 mg | INTRAVENOUS | Status: DC
  Filled 2015-04-06 (×2): qty 750

## 2015-04-06 MED ORDER — NITROGLYCERIN IN D5W 200-5 MCG/ML-% IV SOLN
2.0000 ug/min | INTRAVENOUS | Status: DC
Start: 2015-04-07 — End: 2015-04-07
  Filled 2015-04-06: qty 250

## 2015-04-06 MED ORDER — CHLORHEXIDINE GLUCONATE 0.12 % MT SOLN: 15.0000 mL | Freq: Once | OROMUCOSAL | Status: DC

## 2015-04-06 NOTE — Progress Notes (Signed)
  Echocardiogram 2D Echocardiogram has been performed.  Darlina Sicilian M 04/06/2015, 8:47 AM

## 2015-04-06 NOTE — Progress Notes (Signed)
VASCULAR LAB PRELIMINARY  PRELIMINARY  PRELIMINARY  PRELIMINARY  Pre-op Cardiac Surgery  Carotid Findings:  No evidence of ICA stenosis. Vertebral artery flow is antegrade.  Upper Extremity Right Left  Brachial Pressures IV Site  Triphasic 126 Triphasic  Radial Waveforms Triphasic Triphasic  Ulnar Waveforms Triphasic Triphasic  Palmar Arch (Allen's Test) Normal  Normal   Findings:  Doppler waveforms remain normal with both radial and ulnar compressions bilaterally.    Lower  Extremity Right Left  Dorsalis Pedis 152 Triphasic 141 Triphasic  Posterior Tibial 175 Triphasic 162 Triphasic  Ankle/Brachial Indices 1.39 1.29    Findings:  Bilaterally ABIs and Doppler waveforms are within normal limits at rest.   Janifer Adie, RVT, RDMS 04/06/2015, 5:02 PM

## 2015-04-06 NOTE — Progress Notes (Addendum)
ANTICOAGULATION CONSULT NOTE - Follow Up Consult  Pharmacy Consult for Heparin Indication: CVTS consult s/p cath   No Known Allergies  Patient Measurements: Height: 5\' 3"  (160 cm) Weight: 228 lb 6.3 oz (103.6 kg) IBW/kg (Calculated) : 52.4 Heparin Dosing Weight: 76.9 kg  Vital Signs: Temp: 98.4 F (36.9 C) (01/11 0700) Temp Source: Oral (01/11 0700) BP: 124/68 mmHg (01/11 0700) Pulse Rate: 70 (01/11 0700)  Labs:  Recent Labs  04/05/15 0026 04/05/15 0320 04/05/15 0810 04/05/15 0850 04/05/15 1058 04/06/15 0257 04/06/15 0730  HGB 14.0  --   --   --   --  12.5  --   HCT 40.5  --   --   --   --  37.3  --   PLT 230  --   --   --   --  209  --   APTT  --   --   --   --   --  44*  --   LABPROT  --   --   --   --  14.6 15.3*  --   INR  --   --   --   --  1.13 1.19  --   HEPARINUNFRC  --   --  0.49  --   --   --  0.24*  CREATININE 0.59  --   --   --   --  0.60  --   TROPONINI  --  0.15*  --  0.30*  --   --   --     Estimated Creatinine Clearance: 99 mL/min (by C-G formula based on Cr of 0.6).  Assessment: Heparin level is subtherapeutic (0.24) on 1250 units/hr. Now s/p cardiac cath. CVTS consult pending.       Goal of Therapy:  Heparin level 0.3-0.7 units/ml Monitor platelets by anticoagulation protocol: Yes   Plan:   Increase Heparin drip to 1400 units/hr  Obtain 6 hour Heparin level  Daily heparin level and CBC while on heparin.   Darl Pikes, PharmD Clinical Pharmacist- Resident Pager: [phone]

## 2015-04-06 NOTE — Progress Notes (Signed)
[id number] Did not walk with pt as she stated she had CP when bathing this morning. Discussed sternal precautions and importance of mobility and IS after surgery. Gave OHS booklet and care guide and wrote down how to view pre op video. Pt stated her daughter will be available after surgery to assist with care. Will follow up after surgery. Graylon Good RN BSN 04/06/2015 12:05 PM

## 2015-04-06 NOTE — Consult Note (Signed)
[address]       [name] [address]             [phone]          CARDIOTHORACIC SURGERY CONSULTATION REPORT  PCP is Kaylee Bryant, Kaylee Leaver, MD Referring Provider is Kaylee Sine, MD Primary Cardiologist is Kaylee Bryant, Kaylee Forge, MD  Reason for consultation:  Severe single vessel CAD with acute coronary syndrome  HPI:  Patient is a 50 year old female with history of type 2 diabetes mellitus but no previous history of coronary artery disease was admitted to the hospital with a weeklong history of accelerating symptoms of exertional substernal chest pain culminating with several prolonged episodes of chest discomfort over this past weekend that developed with minimal activity and were slow to resolve. On the morning of hospital admission the patient was awoken from her sleep with severe substernal chest pain associated with shortness of breath. She took an aspirin and symptoms subsided a little bit but persisted. EMS was called and sublingual nitroglycerin was administered on their arrival. The patient's symptoms of chest pain subsequently resolved. She was transported to the emergency department where initial EKG was notable for sinus rhythm without acute ST segment changes. Serial troponin levels were weakly positive and trended up to 0.3, consistent with acute coronary syndrome and mild acute non-ST segment elevation myocardial infarction. She has not had any recurrent chest pain since hospital admission.  The patient underwent diagnostic cardiac catheterization by Dr. Claiborne Bryant demonstrating 95% ostial stenosis of the left anterior descending coronary artery. Left ventricular systolic function remains preserved. Coronary anatomy is felt to be unfavorable for percutaneous coronary intervention. Cardiothoracic surgical consultation was requested.  The patient is married and lives in [place] with her husband and 2 of her children. 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  She states that she has been physically active for all of her adult life. A proximally 1 year ago she began to experience progressive exertional fatigue. She initially attributed these symptoms to her underlying multiple sclerosis. She first began to experience symptoms of exertional chest discomfort approximately one week ago. Prior to that she denies any previous history of chest discomfort or significant exertional shortness of breath. She denies a history of PND, orthopnea, or syncope. She has occasional dizzy spells when she stands up from a seated position. She has slight bilateral lower extremity edema by the end of a long work day standing on her feet. Symptoms related to her underlying multiple sclerosis primarily that of slight numbness and weakness of her lower legs. The symptoms have been stable for many years and have not been problematic to a significant degree.  Past Medical History  Diagnosis Date  . Multiple sclerosis ([place])   . Fatigue   . Abnormal Pap smear 2004    HPV, cone biopsy  . DM (diabetes mellitus), gestational 2006    insulin at the time  . IBS (irritable bowel syndrome)     with chronic constipation  . DDD (degenerative disc disease), lumbar   . Coronary artery disease involving native coronary artery with unstable angina pectoris ([place]) 04/05/2015  . Obesity     Past Surgical History  Procedure Laterality Date  . Cholecystectomy      1988  . Cesarean section      1996, 2006  . Colposcopy  2004  . Cervical cone biopsy  2004  . Cardiac catheterization N/A 04/05/2015    Procedure:  Left Heart Cath and Coronary Angiography;  Surgeon: Kaylee Sine, MD;  Location: [facility];  Service: Cardiovascular;  Laterality: N/A;    Family History  Problem Relation Age of Onset  . Diabetes Mother   . Hypertension Mother   . Hypertension Father   . Atrial fibrillation Father   . Diabetes Sister   . Breast cancer Maternal Aunt   . Arrhythmia  Sister     Social History   Social History  . Marital Status: Married    Spouse Name: N/A  . Number of Children: 4  . Years of Education: college   Occupational History  . RN    Social History Main Topics  . Smoking status: Former Smoker -- 1.50 packs/day for 15 years    Types: Cigarettes    Quit date: 07/04/2003  . Smokeless tobacco: Never Used  . Alcohol Use: 0.6 oz/week    1 Glasses of wine per week  . Drug Use: No  . Sexual Activity:    Partners: Male    Birth Control/ Protection: Surgical     Comment: vasectomy   Other Topics Concern  . Not on file   Social History Narrative   Patient lives at home with her husband and children. Patient works for the [employer] in [place]. College education.     Prior to Admission medications   Medication Sig Start Date End Date Taking? Authorizing Provider  baclofen (LIORESAL) 10 MG tablet TAKE ONE TABLET BY MOUTH THREE TIMES DAILY 12/16/14  Yes Kaylee Pacas, MD  gabapentin (NEURONTIN) 300 MG capsule TAKE ONE CAPSULE BY MOUTH THREE TIMES DAILY 11/10/14  Yes Kaylee Pacas, MD  ibuprofen (ADVIL,MOTRIN) 200 MG tablet Take 800 mg by mouth at bedtime as needed for moderate pain.    Yes Historical Provider, MD  metFORMIN (GLUCOPHAGE-XR) 500 MG 24 hr tablet Take 500 mg by mouth every evening.   Yes Historical Provider, MD  methylphenidate (RITALIN) 10 MG tablet Take 1 tablet (10 mg total) by mouth 3 (three) times daily with meals. Do not take last dose any later than 4pm 03/09/15  Yes Kaylee Pacas, MD  natalizumab (TYSABRI) 300 MG/15ML injection Inject 15 mLs (300 mg total) into the vein every 30 (thirty) days. 08/04/14  Yes Kaylee Pacas, MD  traZODone (DESYREL) 50 MG tablet Take 50 mg by mouth at bedtime.   Yes Historical Provider, MD    Current Facility-Administered Medications  Medication Dose Route Frequency Provider Last Rate Last Dose  . 0.9 %  sodium chloride infusion  250 mL Intravenous PRN Kaylee Sine, MD      . 0.9 %  sodium chloride  infusion   Intravenous Continuous Kaylee Sine, MD 100 mL/hr at 04/06/15 0019    . acetaminophen (TYLENOL) tablet 650 mg  650 mg Oral Q4H PRN Kaylee Patience, MD      . aspirin EC tablet 81 mg  81 mg Oral Daily Kaylee Sine, MD   81 mg at 04/06/15 0947  . atorvastatin (LIPITOR) tablet 80 mg  80 mg Oral q1800 Kaylee Sine, MD      . baclofen (LIORESAL) tablet 10 mg  10 mg Oral TID Kaylee Patience, MD   10 mg at 04/05/15 2100  . gabapentin (NEURONTIN) capsule 300 mg  300 mg Oral TID Kaylee Patience, MD   300 mg at 04/05/15 2100  . heparin ADULT infusion 100 units/mL (25000 units/250 mL)  1,400 Units/hr Intravenous Continuous Rexene Alberts,  MD 14 mL/hr at 04/06/15 0858 1,400 Units/hr at 04/06/15 0858  . insulin aspart (novoLOG) injection 0-9 Units  0-9 Units Subcutaneous TID WC Kaylee Patience, MD   2 Units at 04/05/15 0809  . methylphenidate (RITALIN) tablet 10 mg  10 mg Oral TID WC Kaylee Patience, MD   10 mg at 04/06/15 0947  . metoprolol tartrate (LOPRESSOR) tablet 25 mg  25 mg Oral BID Kaylee Sine, MD   25 mg at 04/06/15 0947  . morphine 2 MG/ML injection 2 mg  2 mg Intravenous Q2H PRN Kaylee Patience, MD      . nitroGLYCERIN 50 mg in dextrose 5 % 250 mL (0.2 mg/mL) infusion  0-200 mcg/min Intravenous Continuous Kaylee Sine, MD 3 mL/hr at 04/06/15 0900 10 mcg/min at 04/06/15 0900  . ondansetron (ZOFRAN) injection 4 mg  4 mg Intravenous Q6H PRN Kaylee Sine, MD      . Derrill Memo ON 04/10/2015] pneumococcal 23 valent vaccine (PNU-IMMUNE) injection 0.5 mL  0.5 mL Intramuscular Prior to discharge Lorretta Harp, MD      . sodium chloride 0.9 % injection 3 mL  3 mL Intravenous Q12H Kaylee Sine, MD   3 mL at 04/05/15 2045  . sodium chloride 0.9 % injection 3 mL  3 mL Intravenous PRN Kaylee Sine, MD      . traZODone (DESYREL) tablet 50 mg  50 mg Oral QHS Kaylee Patience, MD   50 mg at 04/05/15 2100    No Known Allergies    Review of  Systems:   General:  normal appetite, decreased energy, no weight gain, no weight loss, no fever  Cardiac:  + chest pain with exertion, + chest pain at rest, + SOB with exertion, no resting SOB, no PND, no orthopnea, + palpitations, no arrhythmia, no atrial fibrillation, + slight LE edema, + occasional dizzy spells, no syncope  Respiratory:  no shortness of breath, no home oxygen, no productive cough, no dry cough, no bronchitis, no wheezing, no hemoptysis, no asthma, no pain with inspiration or cough, no sleep apnea, no CPAP at night  GI:   no difficulty swallowing, no reflux, no frequent heartburn, no hiatal hernia, no abdominal pain, no constipation, no diarrhea, no hematochezia, no hematemesis, no melena  GU:   no dysuria,  no frequency, no urinary tract infection, no hematuria, no kidney stones, no kidney disease  Vascular:  no pain suggestive of claudication, no pain in feet, no leg cramps, no varicose veins, no DVT, no non-healing foot ulcer  Neuro:   no stroke, no TIA's, no seizures, no headaches, no temporary blindness one eye,  no slurred speech, + peripheral neuropathy, no chronic pain, no instability of gait, no memory/cognitive dysfunction  Musculoskeletal: + mild arthritis - primarily involving the left knee, no joint swelling, no myalgias, no difficulty walking, normal mobility   Skin:   no rash, no itching, no skin infections, no pressure sores or ulcerations  Psych:   no anxiety, no depression, no nervousness, no unusual recent stress  Eyes:   no blurry vision, no floaters, no recent vision changes, does not wear glasses other than readers  ENT:   no hearing loss, no loose or painful teeth, no dentures, last saw dentist within 2 years  Hematologic:  no easy bruising, no abnormal bleeding, no clotting disorder, no frequent epistaxis  Endocrine:  + diabetes, does not check CBG's at home     Physical Exam:   BP 112/68  mmHg  Pulse 74  Temp(Src) 98.4 F (36.9 C) (Oral)  Resp 17   Ht 5\' 3"  (1.6 m)  Wt 103.6 kg (228 lb 6.3 oz)  BMI 40.47 kg/m2  SpO2 98%  LMP 10/03/2014  General:  Obese, o/w  well-appearing  HEENT:  Unremarkable   Neck:   no JVD, no bruits, no adenopathy   Chest:   clear to auscultation, symmetrical breath sounds, no wheezes, no rhonchi   CV:   RRR, no murmur   Abdomen:  soft, non-tender, no masses   Extremities:  warm, well-perfused, pulses palpable, no lower extremity edema  Rectal/GU  Deferred  Neuro:   Grossly non-focal and symmetrical throughout  Skin:   Clean and dry, no rashes, no breakdown  Diagnostic Tests:  CARDIAC CATHETERIZATION Procedures    Left Heart Cath and Coronary Angiography    Conclusion     Prox RCA lesion, 20% stenosed.  Mid RCA lesion, 30% stenosed.  Ost LAD lesion, 95% stenosed.  The left ventricular systolic function is normal.  Normal global ejection fraction at 55% with mild distal anterolateral hypocontractility.  High-grade 95% ostial LAD stenosis arising immediately after trifurcation from the left main, ramus intermediate and left circumflex vessel. Mild nonobstructive RCA disease with 20% proximal stenosis followed by diffuse mid 30% luminal irregularity.  RECOMMENDATION: The angiographic findings were reviewed with the patient in detail. Options were discussed with the patient including a left internal mammary artery bypass graft to her LAD versus PCI/stenting to her ostial LAD. Angiograms were reviewed with Drs. Cooper and [employer]. We all feel that there is not a good landing zone to land the ostial portion of a stent due to the large ramus intermediate vessel. With the patient's recent development of diabetes mellitus, it is felt that the best long-term option is a LIMA graft placed to her LAD. Surgical consultation was requested. The patient will be transferred to the CCU/TCU and will be started on IV nitroglycerin with plans for heparinization and beta blocker therapy.       Indications    NSTEMI (non-ST elevated myocardial infarction) ([place]) [I21.4 (ICD-10-CM)]   Unstable angina ([place]) [I20.0 (ICD-10-CM)]    Technique and Indications    Mrs. Kaylee Bryant is a 50 year old white female with recent diagnosis of diabetes mellitus. She has remote tobacco history but quit smoking in 2005. For the last several days she has developed unstable anginal symptoms. She was awakened from sleep last evening with 10 out of 10 chest pain. She presented to the emergency room with chest pain relieved with nitroglycerin. Troponins are mildly positive at 0.15 and 0.30. ECG has shown T-wave inversion in V1 and V2. She is referred for cardiac catheterization and possible percutaneous coronary intervention.  The patient [place] [facility] [facility] The patient was premedicated with Versed 2 mg and fentanyl 50 mcg. A right radial approach was utilized after an Allen's test verified adequate circulation. The right radial artery was punctured via the Seldinger technique, and a 6 [place] Glidesheath Slender was inserted without difficulty. A radial cocktail consisting of Verapamil, IV nitroglycerin, and lidocaine was administered. Weight adjusted heparin was administered. A safety J wire was advanced into the ascending aorta. Diagnostic catheterization was done with a 5 [place] TIG 4.0 catheter. A 5 French pigtail catheter was used for left ventriculography. Angiograms were reviewed in detail with the patient as well as colleagues. A TR radial band was applied for hemostasis. The patient left the catheterization laboratory in  stable condition.   Estimated blood loss <50 mL. There were no immediate complications during the procedure.    Coronary Findings    Dominance: Co-dominant   Left Main  The left main coronary artery, long vessel which trifurcated it to an upward takeoff LAD, a large ramus intermediate vessel and large circumflex coronary artery. The LAD  had a 95% ostial stenosis originating at the origin of the trifurcation from the left main. The remainder of the LAD was free of significant disease. The Ramus intermediate vessel was a large caliber normal vessel. The Left circumflex coronary was normal vessel that gave Kaylee to 2 marginal branches. Right coronary artery was a moderate size vessel that had smooth 20% proximal and 30% diffuse mid luminal irregularity.     Left Anterior Descending   . Ost LAD lesion, 95% stenosed.     Right Coronary Artery   . Prox RCA lesion, 20% stenosed.   . Mid RCA lesion, 30% stenosed.      Wall Motion                 Left Heart    Left Ventricle The left ventricular size is normal. The left ventricular systolic function is normal. There are wall motion abnormalities in the left ventricle. MIld distal anterolateral hypokinesis.    Coronary Diagrams    Diagnostic Diagram            Implants    Name ID Temporary Type Supply   No information to display    PACS Images    Show images for Cardiac catheterization     Link to Procedure Log    Procedure Log      Hemo Data       Most Recent Value   AO Systolic Pressure  123456 mmHg   AO Diastolic Pressure  72 mmHg   AO Mean  90 mmHg   LV Systolic Pressure  0000000 mmHg   LV Diastolic Pressure  4 mmHg   LV EDP  8 mmHg   Arterial Occlusion Pressure Extended Systolic Pressure  [id number] mmHg   Arterial Occlusion Pressure Extended Diastolic Pressure  72 mmHg   Arterial Occlusion Pressure Extended Mean Pressure  100 mmHg   Left Ventricular Apex Extended Systolic Pressure  123456 mmHg   Left Ventricular Apex Extended Diastolic Pressure  8 mmHg   Left Ventricular Apex Extended EDP Pressure  16 mmHg      Impression:  I have personally reviewed the patient's diagnostic cardiac catheterization. She has single-vessel coronary artery disease with 95% ostial stenosis of the left anterior descending coronary artery. Left  ventricular systolic function is preserved. She presents with acute coronary syndrome with positive cardiac enzymes. Based upon her coronary anatomy I agree that she would best be treated with surgical revascularization.   Plan:  I have reviewed the indications, risks, and potential benefits of coronary artery bypass grafting with the patient and her family.  Alternative treatment strategies have been discussed, including the relative risks, benefits and long term prognosis associated with medical therapy, percutaneous coronary intervention, and surgical revascularization.  The patient understands and accepts all potential associated risks of surgery including but not limited to risk of death, stroke or other neurologic complication, myocardial infarction, congestive heart failure, respiratory failure, renal failure, bleeding requiring blood transfusion and/or reexploration, aortic dissection or other major vascular complication, arrhythmia, heart block or bradycardia requiring permanent pacemaker, pneumonia, pleural effusion, wound infection, pulmonary embolus or other thromboembolic complication, chronic pain or other delayed  complications related to median sternotomy, or the late recurrence of symptomatic ischemic heart disease and/or congestive heart failure.  The importance of long term risk modification have been emphasized.  All questions answered.  We plan to proceed with single vessel coronary artery bypass grafting tomorrow.  I spent in excess of 120 minutes during the conduct of this hospital consultation and >50% of this time involved direct face-to-face encounter for counseling and/or coordination of the patient's care.    Valentina Gu. Roxy Manns, MD 04/06/2015 9:48 AM

## 2015-04-06 NOTE — Progress Notes (Signed)
Subjective:  POD #1 radial cath by Dr Leda Gauze showing 95% ostial LAD lesion with nl LV fxn  Objective:  Temp:  [97.5 F (36.4 C)-98.4 F (36.9 C)] 98.4 F (36.9 C) (01/11 0700) Pulse Rate:  [0-101] 70 (01/11 0700) Resp:  [0-25] 11 (01/11 0700) BP: (85-141)/(41-90) 124/68 mmHg (01/11 0700) SpO2:  [0 %-100 %] 97 % (01/11 0700) Weight:  [228 lb 6.3 oz (103.6 kg)] 228 lb 6.3 oz (103.6 kg) (01/10 2042) Weight change: -2 lb 9.7 oz (-1.181 kg)  Intake/Output from previous day: 01/10 0701 - 01/11 0700 In: 1595.5 [P.O.:240; I.V.:1355.5] Out: 250 [Urine:250]  Intake/Output from this shift:    Physical Exam: General appearance: alert and no distress Neck: no adenopathy, no carotid bruit, no JVD, supple, symmetrical, trachea midline and thyroid not enlarged, symmetric, no tenderness/mass/nodules Lungs: clear to auscultation bilaterally Heart: regular rate and rhythm, S1, S2 normal, no murmur, click, rub or gallop Extremities: Right radial puncture site OK  Lab Results: Results for orders placed or performed during the hospital encounter of 04/05/15 (from the past 48 hour(s))  Basic metabolic panel     Status: Abnormal   Collection Time: 04/05/15 12:26 AM  Result Value Ref Range   Sodium 137 135 - 145 mmol/L   Potassium 3.9 3.5 - 5.1 mmol/L   Chloride 101 101 - 111 mmol/L   CO2 23 22 - 32 mmol/L   Glucose, Bld 218 (H) 65 - 99 mg/dL   BUN 12 6 - 20 mg/dL   Creatinine, Ser 0.59 0.44 - 1.00 mg/dL   Calcium 8.8 (L) 8.9 - 10.3 mg/dL   GFR calc non Af Amer >60 >60 mL/min   GFR calc Af Amer >60 >60 mL/min    Comment: (NOTE) The eGFR has been calculated using the CKD EPI equation. This calculation has not been validated in all clinical situations. eGFR's persistently <60 mL/min signify possible Chronic Kidney Disease.    Anion gap 13 5 - 15  CBC     Status: None   Collection Time: 04/05/15 12:26 AM  Result Value Ref Range   WBC 7.7 4.0 - 10.5 K/uL   RBC 4.62 3.87 - 5.11 MIL/uL     Hemoglobin 14.0 12.0 - 15.0 g/dL   HCT 40.5 36.0 - 46.0 %   MCV 87.7 78.0 - 100.0 fL   MCH 30.3 26.0 - 34.0 pg   MCHC 34.6 30.0 - 36.0 g/dL   RDW 14.1 11.5 - 15.5 %   Platelets 230 150 - 400 K/uL  Differential     Status: None   Collection Time: 04/05/15 12:26 AM  Result Value Ref Range   Neutrophils Relative % 41 %   Neutro Abs 3.2 1.7 - 7.7 K/uL   Lymphocytes Relative 47 %   Lymphs Abs 3.6 0.7 - 4.0 K/uL   Monocytes Relative 8 %   Monocytes Absolute 0.6 0.1 - 1.0 K/uL   Eosinophils Relative 3 %   Eosinophils Absolute 0.2 0.0 - 0.7 K/uL   Basophils Relative 1 %   Basophils Absolute 0.1 0.0 - 0.1 K/uL  I-stat troponin, ED (not at [facility], [facility])     Status: None   Collection Time: 04/05/15 12:41 AM  Result Value Ref Range   Troponin i, poc 0.06 0.00 - 0.08 ng/mL   Comment 3            Comment: Due to the release kinetics of cTnI, a negative result within the first hours of the onset of  symptoms does not rule out myocardial infarction with certainty. If myocardial infarction is still suspected, repeat the test at appropriate intervals.   Troponin I (q 6hr x 3)     Status: Abnormal   Collection Time: 04/05/15  3:20 AM  Result Value Ref Range   Troponin I 0.15 (H) <0.031 ng/mL    Comment:        PERSISTENTLY INCREASED TROPONIN VALUES IN THE RANGE OF 0.04-0.49 ng/mL CAN BE SEEN IN:       -UNSTABLE ANGINA       -CONGESTIVE HEART FAILURE       -MYOCARDITIS       -CHEST TRAUMA       -ARRYHTHMIAS       -LATE PRESENTING MYOCARDIAL INFARCTION       -COPD   CLINICAL FOLLOW-UP RECOMMENDED.   Glucose, capillary     Status: Abnormal   Collection Time: 04/05/15  3:27 AM  Result Value Ref Range   Glucose-Capillary 184 (H) 65 - 99 mg/dL  Glucose, capillary     Status: Abnormal   Collection Time: 04/05/15  7:39 AM  Result Value Ref Range   Glucose-Capillary 162 (H) 65 - 99 mg/dL  Pregnancy, urine     Status: None   Collection Time: 04/05/15  7:47 AM  Result Value Ref Range    Preg Test, Ur NEGATIVE NEGATIVE    Comment:        THE SENSITIVITY OF THIS METHODOLOGY IS >20 mIU/mL.   Heparin level (unfractionated)     Status: None   Collection Time: 04/05/15  8:10 AM  Result Value Ref Range   Heparin Unfractionated 0.49 0.30 - 0.70 IU/mL    Comment:        IF HEPARIN RESULTS ARE BELOW EXPECTED VALUES, AND PATIENT DOSAGE HAS BEEN CONFIRMED, SUGGEST FOLLOW UP TESTING OF ANTITHROMBIN III LEVELS.   Hepatic function panel     Status: Abnormal   Collection Time: 04/05/15  8:50 AM  Result Value Ref Range   Total Protein 6.7 6.5 - 8.1 g/dL   Albumin 4.0 3.5 - 5.0 g/dL   AST 80 (H) 15 - 41 U/L   ALT 85 (H) 14 - 54 U/L   Alkaline Phosphatase 58 38 - 126 U/L   Total Bilirubin 0.8 0.3 - 1.2 mg/dL   Bilirubin, Direct 0.2 0.1 - 0.5 mg/dL   Indirect Bilirubin 0.6 0.3 - 0.9 mg/dL  Lipid panel     Status: Abnormal   Collection Time: 04/05/15  8:50 AM  Result Value Ref Range   Cholesterol 199 0 - 200 mg/dL   Triglycerides 107 <150 mg/dL   HDL 34 (L) >40 mg/dL   Total CHOL/HDL Ratio 5.9 RATIO   VLDL 21 0 - 40 mg/dL   LDL Cholesterol 144 (H) 0 - 99 mg/dL    Comment:        Total Cholesterol/HDL:CHD Risk Coronary Heart Disease Risk Table                     Men   Women  1/2 Average Risk   3.4   3.3  Average Risk       5.0   4.4  2 X Average Risk   9.6   7.1  3 X Average Risk  23.4   11.0        Use the calculated Patient Ratio above and the CHD Risk Table to determine the patient's CHD Risk.  ATP III CLASSIFICATION (LDL):  <100     mg/dL   Optimal  100-129  mg/dL   Near or Above                    Optimal  130-159  mg/dL   Borderline  160-189  mg/dL   High  >190     mg/dL   Very High   TSH     Status: None   Collection Time: 04/05/15  8:50 AM  Result Value Ref Range   TSH 1.684 0.350 - 4.500 uIU/mL  Troponin I (q 6hr x 3)     Status: Abnormal   Collection Time: 04/05/15  8:50 AM  Result Value Ref Range   Troponin I 0.30 (H) <0.031 ng/mL     Comment:        PERSISTENTLY INCREASED TROPONIN VALUES IN THE RANGE OF 0.04-0.49 ng/mL CAN BE SEEN IN:       -UNSTABLE ANGINA       -CONGESTIVE HEART FAILURE       -MYOCARDITIS       -CHEST TRAUMA       -ARRYHTHMIAS       -LATE PRESENTING MYOCARDIAL INFARCTION       -COPD   CLINICAL FOLLOW-UP RECOMMENDED.   Protime-INR     Status: None   Collection Time: 04/05/15 10:58 AM  Result Value Ref Range   Prothrombin Time 14.6 11.6 - 15.2 seconds   INR 1.13 0.00 - 1.49  Glucose, capillary     Status: Abnormal   Collection Time: 04/05/15 11:28 AM  Result Value Ref Range   Glucose-Capillary 137 (H) 65 - 99 mg/dL  Glucose, capillary     Status: Abnormal   Collection Time: 04/05/15  5:03 PM  Result Value Ref Range   Glucose-Capillary 117 (H) 65 - 99 mg/dL   Comment 1 Document in Chart   Surgical pcr screen     Status: None   Collection Time: 04/05/15  8:45 PM  Result Value Ref Range   MRSA, PCR NEGATIVE NEGATIVE   Staphylococcus aureus NEGATIVE NEGATIVE    Comment:        The Xpert SA Assay (FDA approved for NASAL specimens in patients over 31 years of age), is one component of a comprehensive surveillance program.  Test performance has been validated by [facility] for patients greater than or equal to 41 year old. It is not intended to diagnose infection nor to guide or monitor treatment.   Glucose, capillary     Status: Abnormal   Collection Time: 04/05/15 10:53 PM  Result Value Ref Range   Glucose-Capillary 182 (H) 65 - 99 mg/dL   Comment 1 Capillary Specimen   CBC     Status: None   Collection Time: 04/06/15  2:57 AM  Result Value Ref Range   WBC 8.4 4.0 - 10.5 K/uL   RBC 4.19 3.87 - 5.11 MIL/uL   Hemoglobin 12.5 12.0 - 15.0 g/dL   HCT 37.3 36.0 - 46.0 %   MCV 89.0 78.0 - 100.0 fL   MCH 29.8 26.0 - 34.0 pg   MCHC 33.5 30.0 - 36.0 g/dL   RDW 14.2 11.5 - 15.5 %   Platelets 209 150 - 400 K/uL  Comprehensive metabolic panel     Status: Abnormal   Collection Time:  04/06/15  2:57 AM  Result Value Ref Range   Sodium 139 135 - 145 mmol/L   Potassium 3.7 3.5 - 5.1 mmol/L  Chloride 107 101 - 111 mmol/L   CO2 24 22 - 32 mmol/L   Glucose, Bld 138 (H) 65 - 99 mg/dL   BUN 7 6 - 20 mg/dL   Creatinine, Ser 0.60 0.44 - 1.00 mg/dL   Calcium 8.3 (L) 8.9 - 10.3 mg/dL   Total Protein 6.1 (L) 6.5 - 8.1 g/dL   Albumin 3.4 (L) 3.5 - 5.0 g/dL   AST 73 (H) 15 - 41 U/L   ALT 75 (H) 14 - 54 U/L   Alkaline Phosphatase 49 38 - 126 U/L   Total Bilirubin 0.7 0.3 - 1.2 mg/dL   GFR calc non Af Amer >60 >60 mL/min   GFR calc Af Amer >60 >60 mL/min    Comment: (NOTE) The eGFR has been calculated using the CKD EPI equation. This calculation has not been validated in all clinical situations. eGFR's persistently <60 mL/min signify possible Chronic Kidney Disease.    Anion gap 8 5 - 15  APTT     Status: Abnormal   Collection Time: 04/06/15  2:57 AM  Result Value Ref Range   aPTT 44 (H) 24 - 37 seconds    Comment:        IF BASELINE aPTT IS ELEVATED, SUGGEST PATIENT RISK ASSESSMENT BE USED TO DETERMINE APPROPRIATE ANTICOAGULANT THERAPY.   Protime-INR     Status: Abnormal   Collection Time: 04/06/15  2:57 AM  Result Value Ref Range   Prothrombin Time 15.3 (H) 11.6 - 15.2 seconds   INR 1.19 0.00 - 1.49  Prealbumin     Status: None   Collection Time: 04/06/15  2:57 AM  Result Value Ref Range   Prealbumin 20.6 18 - 38 mg/dL  Type and screen     Status: None   Collection Time: 04/06/15  2:57 AM  Result Value Ref Range   ABO/RH(D) O POS    Antibody Screen NEG    Sample Expiration 04/09/2015   ABO/Rh     Status: None (Preliminary result)   Collection Time: 04/06/15  3:42 AM  Result Value Ref Range   ABO/RH(D) O POS   Lipid panel     Status: Abnormal   Collection Time: 04/06/15  5:00 AM  Result Value Ref Range   Cholesterol 159 0 - 200 mg/dL   Triglycerides 94 <150 mg/dL   HDL 28 (L) >40 mg/dL   Total CHOL/HDL Ratio 5.7 RATIO   VLDL 19 0 - 40 mg/dL   LDL  Cholesterol 112 (H) 0 - 99 mg/dL    Comment:        Total Cholesterol/HDL:CHD Risk Coronary Heart Disease Risk Table                     Men   Women  1/2 Average Risk   3.4   3.3  Average Risk       5.0   4.4  2 X Average Risk   9.6   7.1  3 X Average Risk  23.4   11.0        Use the calculated Patient Ratio above and the CHD Risk Table to determine the patient's CHD Risk.        ATP III CLASSIFICATION (LDL):  <100     mg/dL   Optimal  100-129  mg/dL   Near or Above                    Optimal  130-159  mg/dL   Borderline  160-189  mg/dL   High  >190     mg/dL   Very High   Heparin level (unfractionated)     Status: Abnormal   Collection Time: 04/06/15  7:30 AM  Result Value Ref Range   Heparin Unfractionated 0.24 (L) 0.30 - 0.70 IU/mL    Comment:        IF HEPARIN RESULTS ARE BELOW EXPECTED VALUES, AND PATIENT DOSAGE HAS BEEN CONFIRMED, SUGGEST FOLLOW UP TESTING OF ANTITHROMBIN III LEVELS.     Imaging: Imaging results have been reviewed  Tele- NSR  Assessment/Plan:   1. Principal Problem: 2.   NSTEMI (non-ST elevated myocardial infarction) ([place]) 3. Active Problems: 4.   Chest pain 5.   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   Obesity 7.   Time Spent Directly with Patient:  20  minutes  Length of Stay:  LOS: 1 day   POD #1 radial cath . 95% ostial LAD Dz with nl LV fxn. On IV hep/NTG. No CP Labs OK. For CABG tomorrow. Dr Roxy Manns to see. Good candidate for Off pump LIMA--> LAD.  Kaylee Bryant 04/06/2015, 9:23 AM

## 2015-04-06 NOTE — Plan of Care (Signed)
Problem: Consults Goal: Cardiac Surgery Patient Education ( See Patient Education module for education specifics.) Outcome: Progressing Watching surgical video with family at this time

## 2015-04-06 NOTE — Progress Notes (Signed)
ANTICOAGULATION CONSULT NOTE - Follow Up Consult  Pharmacy Consult for Heparin Indication: CVTS consult s/p cath   No Known Allergies  Patient Measurements: Height: 5\' 3"  (160 cm) Weight: 228 lb 6.3 oz (103.6 kg) IBW/kg (Calculated) : 52.4 Heparin Dosing Weight: 80 kg  Vital Signs: Temp: 98.8 F (37.1 C) (01/11 1307) Temp Source: Oral (01/11 1307) BP: 143/92 mmHg (01/11 1307) Pulse Rate: 78 (01/11 1307)  Labs:  Recent Labs  04/05/15 0026 04/05/15 0320 04/05/15 0810 04/05/15 0850 04/05/15 1058 04/06/15 0257 04/06/15 0730 04/06/15 1542  HGB 14.0  --   --   --   --  12.5  --   --   HCT 40.5  --   --   --   --  37.3  --   --   PLT 230  --   --   --   --  209  --   --   APTT  --   --   --   --   --  44*  --   --   LABPROT  --   --   --   --  14.6 15.3*  --   --   INR  --   --   --   --  1.13 1.19  --   --   HEPARINUNFRC  --   --  0.49  --   --   --  0.24* 0.40  CREATININE 0.59  --   --   --   --  0.60  --   --   TROPONINI  --  0.15*  --  0.30*  --   --   --   --     Estimated Creatinine Clearance: 99 mL/min (by C-G formula based on Cr of 0.6).  Assessment: 7 YOF who is now s/p cardiac cath for CABG tomorrow. F/u HL is therapeutic at 0.4. CVTS consult pending.       Goal of Therapy:  Heparin level 0.3-0.7 units/ml Monitor platelets by anticoagulation protocol: Yes   Plan:  -Continue IV heparin at 1400 units/hr -F/u 6 hr confirmatory level  -Monitor daily CBC, HL and s/s of bleeding   Albertina Parr, PharmD., BCPS Clinical Pharmacist Pager [phone]

## 2015-04-07 ENCOUNTER — Encounter (HOSPITAL_COMMUNITY)
Admission: EM | Disposition: A | Discharge status: Home / Self Care | Payer: Managed Care, Other (non HMO) | Source: Home / Self Care | Attending: Thoracic Surgery (Cardiothoracic Vascular Surgery)

## 2015-04-07 ENCOUNTER — Inpatient Hospital Stay (HOSPITAL_COMMUNITY): Payer: Managed Care, Other (non HMO)

## 2015-04-07 ENCOUNTER — Inpatient Hospital Stay (HOSPITAL_COMMUNITY): Payer: Managed Care, Other (non HMO) | Admitting: Certified Registered Nurse Anesthetist

## 2015-04-07 ENCOUNTER — Encounter (HOSPITAL_COMMUNITY): Payer: Self-pay | Admitting: Thoracic Surgery (Cardiothoracic Vascular Surgery)

## 2015-04-07 DIAGNOSIS — Z951 Presence of aortocoronary bypass graft: Secondary | ICD-10-CM

## 2015-04-07 HISTORY — PX: CORONARY ARTERY BYPASS GRAFT: SHX141

## 2015-04-07 HISTORY — PX: TEE WITHOUT CARDIOVERSION: SHX5443

## 2015-04-07 HISTORY — DX: Presence of aortocoronary bypass graft: Z95.1

## 2015-04-07 LAB — CBC
HCT: 36.4 % (ref 36.0–46.0)
HCT: 35 % — ABNORMAL LOW (ref 36.0–46.0)
HCT: 40.2 % (ref 36.0–46.0)
Hemoglobin: 12.2 g/dL (ref 12.0–15.0)
Hemoglobin: 11.7 g/dL — ABNORMAL LOW (ref 12.0–15.0)
Hemoglobin: 13.6 g/dL (ref 12.0–15.0)
MCH: 29.5 pg (ref 26.0–34.0)
MCH: 29.4 pg (ref 26.0–34.0)
MCH: 29.8 pg (ref 26.0–34.0)
MCHC: 33.5 g/dL (ref 30.0–36.0)
MCHC: 33.4 g/dL (ref 30.0–36.0)
MCHC: 33.8 g/dL (ref 30.0–36.0)
MCV: 88.1 fL (ref 78.0–100.0)
MCV: 87.9 fL (ref 78.0–100.0)
MCV: 88.2 fL (ref 78.0–100.0)
Platelets: 202 10*3/uL (ref 150–400)
Platelets: 169 10*3/uL (ref 150–400)
Platelets: 233 10*3/uL (ref 150–400)
RBC: 4.13 MIL/uL (ref 3.87–5.11)
RBC: 3.98 MIL/uL (ref 3.87–5.11)
RBC: 4.56 MIL/uL (ref 3.87–5.11)
RDW: 14.2 % (ref 11.5–15.5)
RDW: 14 % (ref 11.5–15.5)
RDW: 14.2 % (ref 11.5–15.5)
WBC: 7.2 10*3/uL (ref 4.0–10.5)
WBC: 7.4 10*3/uL (ref 4.0–10.5)
WBC: 13.7 10*3/uL — ABNORMAL HIGH (ref 4.0–10.5)

## 2015-04-07 LAB — POCT I-STAT, CHEM 8
BUN: <3 mg/dL — ABNORMAL LOW (ref 6–20)
BUN: <3 mg/dL — ABNORMAL LOW (ref 6–20)
BUN: <3 mg/dL — ABNORMAL LOW (ref 6–20)
Calcium, Ion: 1.18 mmol/L (ref 1.12–1.23)
Calcium, Ion: 1.15 mmol/L (ref 1.12–1.23)
Calcium, Ion: 1.17 mmol/L (ref 1.12–1.23)
Chloride: 104 mmol/L (ref 101–111)
Chloride: 104 mmol/L (ref 101–111)
Chloride: 105 mmol/L (ref 101–111)
Creatinine, Ser: 0.4 mg/dL — ABNORMAL LOW (ref 0.44–1.00)
Creatinine, Ser: 0.3 mg/dL — ABNORMAL LOW (ref 0.44–1.00)
Creatinine, Ser: 0.5 mg/dL (ref 0.44–1.00)
Glucose, Bld: 165 mg/dL — ABNORMAL HIGH (ref 65–99)
Glucose, Bld: 169 mg/dL — ABNORMAL HIGH (ref 65–99)
Glucose, Bld: 162 mg/dL — ABNORMAL HIGH (ref 65–99)
HCT: 37 % (ref 36.0–46.0)
HCT: 34 % — ABNORMAL LOW (ref 36.0–46.0)
HCT: 38 % (ref 36.0–46.0)
Hemoglobin: 12.6 g/dL (ref 12.0–15.0)
Hemoglobin: 11.6 g/dL — ABNORMAL LOW (ref 12.0–15.0)
Hemoglobin: 12.9 g/dL (ref 12.0–15.0)
Potassium: 3.7 mmol/L (ref 3.5–5.1)
Potassium: 3.6 mmol/L (ref 3.5–5.1)
Potassium: 4 mmol/L (ref 3.5–5.1)
Sodium: 141 mmol/L (ref 135–145)
Sodium: 141 mmol/L (ref 135–145)
Sodium: 141 mmol/L (ref 135–145)
TCO2: 27 mmol/L (ref 0–100)
TCO2: 27 mmol/L (ref 0–100)
TCO2: 24 mmol/L (ref 0–100)

## 2015-04-07 LAB — GLUCOSE, CAPILLARY
Glucose-Capillary: 149 mg/dL — ABNORMAL HIGH (ref 65–99)
Glucose-Capillary: 125 mg/dL — ABNORMAL HIGH (ref 65–99)
Glucose-Capillary: 137 mg/dL — ABNORMAL HIGH (ref 65–99)
Glucose-Capillary: 138 mg/dL — ABNORMAL HIGH (ref 65–99)
Glucose-Capillary: 149 mg/dL — ABNORMAL HIGH (ref 65–99)
Glucose-Capillary: 147 mg/dL — ABNORMAL HIGH (ref 65–99)
Glucose-Capillary: 150 mg/dL — ABNORMAL HIGH (ref 65–99)

## 2015-04-07 LAB — HEPARIN LEVEL (UNFRACTIONATED): Heparin Unfractionated: 0.57 IU/mL (ref 0.30–0.70)

## 2015-04-07 LAB — HEMOGLOBIN A1C
Hgb A1c MFr Bld: 9.3 % — ABNORMAL HIGH (ref 4.8–5.6)
Mean Plasma Glucose: 220 mg/dL

## 2015-04-07 LAB — POCT I-STAT 3, ART BLOOD GAS (G3+)
Acid-base deficit: 3 mmol/L — ABNORMAL HIGH (ref 0.0–2.0)
Bicarbonate: 23.3 mEq/L (ref 20.0–24.0)
O2 Saturation: 94 %
Patient temperature: 36.8
TCO2: 25 mmol/L (ref 0–100)
pCO2 arterial: 47.1 mmHg — ABNORMAL HIGH (ref 35.0–45.0)
pH, Arterial: 7.301 — ABNORMAL LOW (ref 7.350–7.450)
pO2, Arterial: 79 mmHg — ABNORMAL LOW (ref 80.0–100.0)

## 2015-04-07 LAB — APTT: aPTT: 27 seconds (ref 24–37)

## 2015-04-07 LAB — CREATININE, SERUM
Creatinine, Ser: 0.55 mg/dL (ref 0.44–1.00)
GFR calc Af Amer: >60 mL/min (ref >60)
GFR calc non Af Amer: >60 mL/min (ref >60)

## 2015-04-07 LAB — PROTIME-INR
INR: 1.23 (ref 0.00–1.49)
Prothrombin Time: 15.7 seconds — ABNORMAL HIGH (ref 11.6–15.2)

## 2015-04-07 LAB — MAGNESIUM: Magnesium: 2.3 mg/dL (ref 1.7–2.4)

## 2015-04-07 SURGERY — CORONARY ARTERY BYPASS GRAFTING (CABG)
Anesthesia: General | Site: Chest

## 2015-04-07 MED ORDER — ACETAMINOPHEN 650 MG RE SUPP
650.0000 mg | Freq: Once | RECTAL | Status: AC
  Administered 2015-04-07: 650 mg via RECTAL
  Filled 2015-04-07: qty 1

## 2015-04-07 MED ORDER — VANCOMYCIN HCL 10 G IV SOLR
1500.0000 mg | INTRAVENOUS | Status: AC
  Administered 2015-04-07: 1500 mg via INTRAVENOUS
  Filled 2015-04-07: qty 1500

## 2015-04-07 MED ORDER — MIDAZOLAM HCL 2 MG/2ML IJ SOLN: 2.0000 mg | INTRAMUSCULAR | Status: DC | PRN

## 2015-04-07 MED ORDER — SODIUM CHLORIDE 0.9 % IV SOLN: 250.0000 mL | INTRAVENOUS | Status: DC

## 2015-04-07 MED ORDER — INSULIN REGULAR HUMAN 100 UNIT/ML IJ SOLN
INTRAMUSCULAR | Status: AC
  Administered 2015-04-07: 2.1 [IU]/h via INTRAVENOUS
  Filled 2015-04-07: qty 2.5

## 2015-04-07 MED ORDER — TRAMADOL HCL 50 MG PO TABS
50.0000 mg | ORAL_TABLET | ORAL | Status: DC | PRN
  Administered 2015-04-08 – 2015-04-10 (×4): 100 mg via ORAL
  Filled 2015-04-07: qty 2
  Filled 2015-04-07: qty 1
  Filled 2015-04-07 (×3): qty 2

## 2015-04-07 MED ORDER — NITROGLYCERIN IN D5W 200-5 MCG/ML-% IV SOLN
0.0000 ug/min | INTRAVENOUS | Status: DC
  Filled 2015-04-07 (×2): qty 250

## 2015-04-07 MED ORDER — ACETAMINOPHEN 160 MG/5ML PO SOLN
1000.0000 mg | Freq: Four times a day (QID) | ORAL | Status: DC
  Filled 2015-04-07: qty 40

## 2015-04-07 MED ORDER — FENTANYL CITRATE (PF) 250 MCG/5ML IJ SOLN
INTRAMUSCULAR | Status: AC
  Filled 2015-04-07: qty 25

## 2015-04-07 MED ORDER — LACTATED RINGERS IV SOLN: 500.0000 mL | Freq: Once | INTRAVENOUS | Status: DC | PRN

## 2015-04-07 MED ORDER — DEXTROSE 5 % IV SOLN
750.0000 mg | INTRAVENOUS | Status: DC
  Filled 2015-04-07: qty 750

## 2015-04-07 MED ORDER — MORPHINE SULFATE (PF) 2 MG/ML IV SOLN
1.0000 mg | INTRAVENOUS | Status: AC | PRN
  Filled 2015-04-07 (×2): qty 1

## 2015-04-07 MED ORDER — BISACODYL 10 MG RE SUPP
10.0000 mg | Freq: Every day | RECTAL | Status: DC
  Filled 2015-04-07 (×2): qty 1

## 2015-04-07 MED ORDER — PHENYLEPHRINE HCL 10 MG/ML IJ SOLN
20.0000 mg | INTRAMUSCULAR | Status: DC | PRN
  Administered 2015-04-07: 15 ug/min via INTRAVENOUS

## 2015-04-07 MED ORDER — SODIUM CHLORIDE 0.9 % IJ SOLN
OROMUCOSAL | Status: DC | PRN
  Administered 2015-04-07 (×3): 4 mL via TOPICAL

## 2015-04-07 MED ORDER — ASPIRIN EC 325 MG PO TBEC
325.0000 mg | DELAYED_RELEASE_TABLET | Freq: Every day | ORAL | Status: DC
Start: 2015-04-08 — End: 2015-04-11
  Administered 2015-04-08 – 2015-04-11 (×4): 325 mg via ORAL
  Filled 2015-04-07 (×5): qty 1

## 2015-04-07 MED ORDER — LACTATED RINGERS IV SOLN: INTRAVENOUS | Status: DC

## 2015-04-07 MED ORDER — INSULIN REGULAR BOLUS VIA INFUSION
0.0000 [IU] | Freq: Three times a day (TID) | INTRAVENOUS | Status: DC
  Filled 2015-04-07: qty 10

## 2015-04-07 MED ORDER — LACTATED RINGERS IV SOLN
INTRAVENOUS | Status: DC | PRN
  Administered 2015-04-07: 07:00:00 via INTRAVENOUS

## 2015-04-07 MED ORDER — METOPROLOL TARTRATE 12.5 MG HALF TABLET
12.5000 mg | ORAL_TABLET | Freq: Two times a day (BID) | ORAL | Status: DC
  Administered 2015-04-08: 12.5 mg via ORAL
  Filled 2015-04-07 (×2): qty 1

## 2015-04-07 MED ORDER — ALBUMIN HUMAN 5 % IV SOLN
250.0000 mL | INTRAVENOUS | Status: AC | PRN
  Filled 2015-04-07: qty 250

## 2015-04-07 MED ORDER — ANTISEPTIC ORAL RINSE SOLUTION (CORINZ)
7.0000 mL | Freq: Four times a day (QID) | OROMUCOSAL | Status: DC
  Administered 2015-04-07 – 2015-04-08 (×5): 7 mL via OROMUCOSAL

## 2015-04-07 MED ORDER — HEPARIN SODIUM (PORCINE) 1000 UNIT/ML IJ SOLN
INTRAMUSCULAR | Status: DC | PRN
  Administered 2015-04-07: 14000 [IU] via INTRAVENOUS

## 2015-04-07 MED ORDER — BISACODYL 5 MG PO TBEC
10.0000 mg | DELAYED_RELEASE_TABLET | Freq: Every day | ORAL | Status: DC
  Administered 2015-04-08 – 2015-04-10 (×3): 10 mg via ORAL
  Filled 2015-04-07 (×5): qty 2

## 2015-04-07 MED ORDER — CHLORHEXIDINE GLUCONATE 0.12 % MT SOLN
15.0000 mL | OROMUCOSAL | Status: AC
  Administered 2015-04-07: 15 mL via OROMUCOSAL
  Filled 2015-04-07: qty 15

## 2015-04-07 MED ORDER — SODIUM CHLORIDE 0.9 % IV SOLN: INTRAVENOUS | Status: DC

## 2015-04-07 MED ORDER — MORPHINE SULFATE (PF) 2 MG/ML IV SOLN
2.0000 mg | INTRAVENOUS | Status: DC | PRN
  Administered 2015-04-07 (×4): 2 mg via INTRAVENOUS
  Administered 2015-04-08: 4 mg via INTRAVENOUS
  Filled 2015-04-07: qty 1
  Filled 2015-04-07: qty 2
  Filled 2015-04-07: qty 1

## 2015-04-07 MED ORDER — DOPAMINE-DEXTROSE 3.2-5 MG/ML-% IV SOLN: 0.0000 ug/kg/min | INTRAVENOUS | Status: DC

## 2015-04-07 MED ORDER — SODIUM CHLORIDE 0.9 % IJ SOLN
INTRAMUSCULAR | Status: AC
  Filled 2015-04-07: qty 10

## 2015-04-07 MED ORDER — PLASMA-LYTE 148 IV SOLN
INTRAVENOUS | Status: AC
  Administered 2015-04-07: 500 mL
  Filled 2015-04-07: qty 2.5

## 2015-04-07 MED ORDER — VANCOMYCIN HCL IN DEXTROSE 1-5 GM/200ML-% IV SOLN
1000.0000 mg | Freq: Once | INTRAVENOUS | Status: AC
  Administered 2015-04-07: 1000 mg via INTRAVENOUS
  Filled 2015-04-07: qty 200

## 2015-04-07 MED ORDER — ACETAMINOPHEN 500 MG PO TABS
1000.0000 mg | ORAL_TABLET | Freq: Four times a day (QID) | ORAL | Status: DC
  Administered 2015-04-08 – 2015-04-11 (×11): 1000 mg via ORAL
  Filled 2015-04-07 (×14): qty 2

## 2015-04-07 MED ORDER — DEXMEDETOMIDINE HCL IN NACL 200 MCG/50ML IV SOLN
0.0000 ug/kg/h | INTRAVENOUS | Status: DC
  Filled 2015-04-07: qty 50

## 2015-04-07 MED ORDER — SODIUM CHLORIDE 0.9 % IV SOLN
INTRAVENOUS | Status: DC
  Filled 2015-04-07: qty 30

## 2015-04-07 MED ORDER — SODIUM CHLORIDE 0.9 % IJ SOLN: 3.0000 mL | INTRAMUSCULAR | Status: DC | PRN

## 2015-04-07 MED ORDER — CHLORHEXIDINE GLUCONATE 0.12% ORAL RINSE (MEDLINE KIT)
15.0000 mL | Freq: Two times a day (BID) | OROMUCOSAL | Status: DC
  Administered 2015-04-07 – 2015-04-08 (×2): 15 mL via OROMUCOSAL

## 2015-04-07 MED ORDER — MIDAZOLAM HCL 10 MG/2ML IJ SOLN
INTRAMUSCULAR | Status: AC
  Filled 2015-04-07: qty 2

## 2015-04-07 MED ORDER — OXYCODONE HCL 5 MG PO TABS
5.0000 mg | ORAL_TABLET | ORAL | Status: DC | PRN
  Administered 2015-04-07: 5 mg via ORAL
  Administered 2015-04-08 (×4): 10 mg via ORAL
  Administered 2015-04-08 (×2): 5 mg via ORAL
  Filled 2015-04-07 (×2): qty 2
  Filled 2015-04-07 (×3): qty 1
  Filled 2015-04-07 (×2): qty 2

## 2015-04-07 MED ORDER — VANCOMYCIN HCL 1000 MG IV SOLR
INTRAVENOUS | Status: AC
  Administered 2015-04-07: 1000 mL
  Filled 2015-04-07 (×2): qty 1000

## 2015-04-07 MED ORDER — LACTATED RINGERS IV SOLN
INTRAVENOUS | Status: DC | PRN
  Administered 2015-04-07 (×2): via INTRAVENOUS

## 2015-04-07 MED ORDER — PHENYLEPHRINE HCL 10 MG/ML IJ SOLN
0.0000 ug/min | INTRAVENOUS | Status: DC
  Filled 2015-04-07: qty 2

## 2015-04-07 MED ORDER — DEXTROSE 5 % IV SOLN
1.5000 g | INTRAVENOUS | Status: AC
  Administered 2015-04-07: 1.5 g via INTRAVENOUS
  Administered 2015-04-07: .75 g via INTRAVENOUS
  Filled 2015-04-07: qty 1.5

## 2015-04-07 MED ORDER — ROCURONIUM BROMIDE 100 MG/10ML IV SOLN
INTRAVENOUS | Status: DC | PRN
  Administered 2015-04-07: 100 mg via INTRAVENOUS

## 2015-04-07 MED ORDER — EPINEPHRINE HCL 1 MG/ML IJ SOLN
0.0000 ug/min | INTRAVENOUS | Status: DC
  Filled 2015-04-07: qty 4

## 2015-04-07 MED ORDER — DEXMEDETOMIDINE HCL IN NACL 400 MCG/100ML IV SOLN
0.1000 ug/kg/h | INTRAVENOUS | Status: AC
  Administered 2015-04-07: .3 ug/kg/h via INTRAVENOUS
  Filled 2015-04-07: qty 100

## 2015-04-07 MED ORDER — SODIUM CHLORIDE 0.9 % IV SOLN
INTRAVENOUS | Status: AC
  Administered 2015-04-07: 69.8 mL/h via INTRAVENOUS
  Filled 2015-04-07: qty 40

## 2015-04-07 MED ORDER — ASPIRIN 81 MG PO CHEW
324.0000 mg | CHEWABLE_TABLET | Freq: Every day | ORAL | Status: DC
  Filled 2015-04-07 (×2): qty 4

## 2015-04-07 MED ORDER — DOCUSATE SODIUM 100 MG PO CAPS
200.0000 mg | ORAL_CAPSULE | Freq: Every day | ORAL | Status: DC
  Administered 2015-04-08 – 2015-04-10 (×3): 200 mg via ORAL
  Filled 2015-04-07 (×5): qty 2

## 2015-04-07 MED ORDER — POTASSIUM CHLORIDE 10 MEQ/50ML IV SOLN
10.0000 meq | INTRAVENOUS | Status: AC
  Administered 2015-04-07 (×3): 10 meq via INTRAVENOUS
  Filled 2015-04-07: qty 50

## 2015-04-07 MED ORDER — 0.9 % SODIUM CHLORIDE (POUR BTL) OPTIME
TOPICAL | Status: DC | PRN
  Administered 2015-04-07: 6000 mL

## 2015-04-07 MED ORDER — FENTANYL CITRATE (PF) 100 MCG/2ML IJ SOLN
INTRAMUSCULAR | Status: DC | PRN
  Administered 2015-04-07: 150 ug via INTRAVENOUS
  Administered 2015-04-07: 50 ug via INTRAVENOUS
  Administered 2015-04-07 (×2): 150 ug via INTRAVENOUS
  Administered 2015-04-07 (×2): 50 ug via INTRAVENOUS
  Administered 2015-04-07 (×2): 100 ug via INTRAVENOUS
  Administered 2015-04-07: 350 ug via INTRAVENOUS
  Administered 2015-04-07: 100 ug via INTRAVENOUS

## 2015-04-07 MED ORDER — LACTATED RINGERS IV SOLN
INTRAVENOUS | Status: DC
  Administered 2015-04-07: 12:00:00 via INTRAVENOUS

## 2015-04-07 MED ORDER — SODIUM CHLORIDE 0.45 % IV SOLN
INTRAVENOUS | Status: DC | PRN
  Administered 2015-04-07: 12:00:00 via INTRAVENOUS

## 2015-04-07 MED ORDER — MIDAZOLAM HCL 5 MG/5ML IJ SOLN
INTRAMUSCULAR | Status: DC | PRN
  Administered 2015-04-07: 1 mg via INTRAVENOUS
  Administered 2015-04-07: 2 mg via INTRAVENOUS
  Administered 2015-04-07 (×2): 1 mg via INTRAVENOUS

## 2015-04-07 MED ORDER — MAGNESIUM SULFATE 50 % IJ SOLN
40.0000 meq | INTRAMUSCULAR | Status: DC
  Filled 2015-04-07: qty 10

## 2015-04-07 MED ORDER — PANTOPRAZOLE SODIUM 40 MG PO TBEC
40.0000 mg | DELAYED_RELEASE_TABLET | Freq: Every day | ORAL | Status: DC
  Filled 2015-04-07: qty 1

## 2015-04-07 MED ORDER — MAGNESIUM SULFATE 4 GM/100ML IV SOLN
4.0000 g | Freq: Once | INTRAVENOUS | Status: AC
  Administered 2015-04-07: 4 g via INTRAVENOUS
  Filled 2015-04-07 (×2): qty 100

## 2015-04-07 MED ORDER — METOPROLOL TARTRATE 1 MG/ML IV SOLN
2.5000 mg | INTRAVENOUS | Status: DC | PRN
  Filled 2015-04-07: qty 5

## 2015-04-07 MED ORDER — SODIUM CHLORIDE 0.9 % IV SOLN
INTRAVENOUS | Status: DC
  Administered 2015-04-07: 7.2 [IU]/h via INTRAVENOUS
  Filled 2015-04-07 (×2): qty 2.5

## 2015-04-07 MED ORDER — PROPOFOL 10 MG/ML IV BOLUS
INTRAVENOUS | Status: DC | PRN
  Administered 2015-04-07: 10 mg via INTRAVENOUS
  Administered 2015-04-07: 80 mg via INTRAVENOUS
  Administered 2015-04-07: 130 mg via INTRAVENOUS

## 2015-04-07 MED ORDER — SODIUM CHLORIDE 0.9 % IJ SOLN
3.0000 mL | Freq: Two times a day (BID) | INTRAMUSCULAR | Status: DC
  Administered 2015-04-08 (×2): 3 mL via INTRAVENOUS

## 2015-04-07 MED ORDER — SODIUM CHLORIDE 0.9 % IV SOLN
INTRAVENOUS | Status: DC
  Administered 2015-04-07: 12:00:00 via INTRAVENOUS

## 2015-04-07 MED ORDER — ONDANSETRON HCL 4 MG/2ML IJ SOLN
4.0000 mg | Freq: Four times a day (QID) | INTRAMUSCULAR | Status: DC | PRN
  Administered 2015-04-07 – 2015-04-09 (×2): 4 mg via INTRAVENOUS
  Filled 2015-04-07 (×3): qty 2

## 2015-04-07 MED ORDER — POTASSIUM CHLORIDE 2 MEQ/ML IV SOLN
80.0000 meq | INTRAVENOUS | Status: DC
  Filled 2015-04-07: qty 40

## 2015-04-07 MED ORDER — FAMOTIDINE IN NACL 20-0.9 MG/50ML-% IV SOLN
20.0000 mg | Freq: Two times a day (BID) | INTRAVENOUS | Status: AC
  Administered 2015-04-07: 20 mg via INTRAVENOUS
  Filled 2015-04-07: qty 50

## 2015-04-07 MED ORDER — PHENYLEPHRINE HCL 10 MG/ML IJ SOLN
30.0000 ug/min | INTRAMUSCULAR | Status: DC
  Filled 2015-04-07: qty 2

## 2015-04-07 MED ORDER — NITROGLYCERIN IN D5W 200-5 MCG/ML-% IV SOLN
2.0000 ug/min | INTRAVENOUS | Status: DC
  Filled 2015-04-07: qty 250

## 2015-04-07 MED ORDER — DEXTROSE 5 % IV SOLN
1.5000 g | Freq: Two times a day (BID) | INTRAVENOUS | Status: AC
  Administered 2015-04-07 – 2015-04-09 (×4): 1.5 g via INTRAVENOUS
  Filled 2015-04-07 (×5): qty 1.5

## 2015-04-07 MED ORDER — METOPROLOL TARTRATE 25 MG/10 ML ORAL SUSPENSION
12.5000 mg | Freq: Two times a day (BID) | ORAL | Status: DC
  Filled 2015-04-07: qty 5

## 2015-04-07 MED ORDER — VECURONIUM BROMIDE 10 MG IV SOLR
INTRAVENOUS | Status: DC | PRN
  Administered 2015-04-07 (×2): 5 mg via INTRAVENOUS

## 2015-04-07 MED ORDER — ACETAMINOPHEN 160 MG/5ML PO SOLN
650.0000 mg | Freq: Once | ORAL | Status: AC
  Filled 2015-04-07: qty 20.3

## 2015-04-07 MED ORDER — PROPOFOL 10 MG/ML IV BOLUS
INTRAVENOUS | Status: AC
  Filled 2015-04-07: qty 20

## 2015-04-07 MED FILL — Potassium Chloride Inj 2 mEq/ML: INTRAVENOUS | Qty: 40 | Status: AC

## 2015-04-07 MED FILL — Heparin Sodium (Porcine) Inj 1000 Unit/ML: INTRAMUSCULAR | Qty: 30 | Status: AC

## 2015-04-07 MED FILL — Magnesium Sulfate Inj 50%: INTRAMUSCULAR | Qty: 10 | Status: AC

## 2015-04-07 SURGICAL SUPPLY — 108 items
BAG DECANTER FOR FLEXI CONT (MISCELLANEOUS) ×6 IMPLANT
BANDAGE ELASTIC 4 VELCRO ST LF (GAUZE/BANDAGES/DRESSINGS) IMPLANT
BANDAGE ELASTIC 6 VELCRO ST LF (GAUZE/BANDAGES/DRESSINGS) IMPLANT
BASKET HEART (ORDER IN 25'S) (MISCELLANEOUS) ×1
BASKET HEART (ORDER IN 25S) (MISCELLANEOUS) ×2 IMPLANT
BLADE STERNUM SYSTEM 6 (BLADE) ×3 IMPLANT
BLADE SURG ROTATE 9660 (MISCELLANEOUS) ×3 IMPLANT
BLOWER MISTER CAL-MED (MISCELLANEOUS) ×3 IMPLANT
BNDG GAUZE ELAST 4 BULKY (GAUZE/BANDAGES/DRESSINGS) IMPLANT
CANISTER SUCTION 2500CC (MISCELLANEOUS) ×3 IMPLANT
CANNULA EZ GLIDE AORTIC 21FR (CANNULA) ×3 IMPLANT
CATH CPB KIT OWEN (MISCELLANEOUS) ×3 IMPLANT
CATH THORACIC 36FR (CATHETERS) ×3 IMPLANT
CLIP TI MEDIUM 24 (CLIP) IMPLANT
CLIP TI WIDE RED SMALL 24 (CLIP) IMPLANT
CRADLE DONUT ADULT HEAD (MISCELLANEOUS) ×3 IMPLANT
DRAIN CHANNEL 32F RND 10.7 FF (WOUND CARE) ×6 IMPLANT
DRAPE CARDIOVASCULAR INCISE (DRAPES) ×1
DRAPE INCISE IOBAN 66X45 STRL (DRAPES) IMPLANT
DRAPE SLUSH MACHINE 52X66 (DRAPES) ×3 IMPLANT
DRAPE SLUSH/WARMER DISC (DRAPES) IMPLANT
DRAPE SRG 135X102X78XABS (DRAPES) ×2 IMPLANT
DRSG AQUACEL AG ADV 3.5X14 (GAUZE/BANDAGES/DRESSINGS) ×3 IMPLANT
DRSG COVADERM 4X14 (GAUZE/BANDAGES/DRESSINGS) IMPLANT
ELECT BLADE 4.0 EZ CLEAN MEGAD (MISCELLANEOUS) ×3
ELECT REM PT RETURN 9FT ADLT (ELECTROSURGICAL) ×6
ELECTRODE BLDE 4.0 EZ CLN MEGD (MISCELLANEOUS) ×2 IMPLANT
ELECTRODE REM PT RTRN 9FT ADLT (ELECTROSURGICAL) ×4 IMPLANT
GAUZE SPONGE 4X4 12PLY STRL (GAUZE/BANDAGES/DRESSINGS) ×3 IMPLANT
GLOVE BIO SURGEON STRL SZ 6.5 (GLOVE) ×3 IMPLANT
GLOVE BIO SURGEON STRL SZ7 (GLOVE) ×9 IMPLANT
GLOVE BIOGEL PI IND STRL 6.5 (GLOVE) ×2 IMPLANT
GLOVE BIOGEL PI IND STRL 7.0 (GLOVE) ×8 IMPLANT
GLOVE BIOGEL PI INDICATOR 6.5 (GLOVE) ×1
GLOVE BIOGEL PI INDICATOR 7.0 (GLOVE) ×4
GLOVE ORTHO TXT STRL SZ7.5 (GLOVE) ×9 IMPLANT
GOWN STRL REUS W/ TWL LRG LVL3 (GOWN DISPOSABLE) ×12 IMPLANT
GOWN STRL REUS W/TWL LRG LVL3 (GOWN DISPOSABLE) ×6
HEMOSTAT POWDER SURGIFOAM 1G (HEMOSTASIS) ×9 IMPLANT
INSERT FOGARTY XLG (MISCELLANEOUS) ×3 IMPLANT
KIT BASIN OR (CUSTOM PROCEDURE TRAY) ×3 IMPLANT
KIT ROOM TURNOVER OR (KITS) ×3 IMPLANT
KIT SUCTION CATH 14FR (SUCTIONS) ×9 IMPLANT
KIT VASOVIEW W/TROCAR VH 2000 (KITS) IMPLANT
LEAD PACING MYOCARDI (MISCELLANEOUS) ×3 IMPLANT
MARKER GRAFT CORONARY BYPASS (MISCELLANEOUS) ×9 IMPLANT
NS IRRIG 1000ML POUR BTL (IV SOLUTION) ×18 IMPLANT
PACK OPEN HEART (CUSTOM PROCEDURE TRAY) ×3 IMPLANT
PAD ARMBOARD 7.5X6 YLW CONV (MISCELLANEOUS) ×6 IMPLANT
PAD ELECT DEFIB RADIOL ZOLL (MISCELLANEOUS) ×3 IMPLANT
PENCIL BUTTON HOLSTER BLD 10FT (ELECTRODE) ×3 IMPLANT
PUNCH AORTIC ROTATE 4.0MM (MISCELLANEOUS) IMPLANT
PUNCH AORTIC ROTATE 4.5MM 8IN (MISCELLANEOUS) IMPLANT
PUNCH AORTIC ROTATE 5MM 8IN (MISCELLANEOUS) IMPLANT
SOLUTION ANTI FOG 6CC (MISCELLANEOUS) IMPLANT
SPONGE LAP 18X18 X RAY DECT (DISPOSABLE) IMPLANT
SPONGE LAP 4X18 X RAY DECT (DISPOSABLE) IMPLANT
SUT BONE WAX W31G (SUTURE) ×3 IMPLANT
SUT ETHIBOND 2 0 SH (SUTURE) ×4
SUT ETHIBOND 2 0 SH 36X2 (SUTURE) ×8 IMPLANT
SUT ETHIBOND X763 2 0 SH 1 (SUTURE) ×6 IMPLANT
SUT MNCRL AB 3-0 PS2 18 (SUTURE) ×6 IMPLANT
SUT MNCRL AB 4-0 PS2 18 (SUTURE) IMPLANT
SUT PDS AB 1 CTX 36 (SUTURE) ×6 IMPLANT
SUT PROLENE 2 0 SH DA (SUTURE) IMPLANT
SUT PROLENE 3 0 SH DA (SUTURE) ×3 IMPLANT
SUT PROLENE 3 0 SH1 36 (SUTURE) IMPLANT
SUT PROLENE 4 0 RB 1 (SUTURE) ×1
SUT PROLENE 4 0 SH DA (SUTURE) IMPLANT
SUT PROLENE 4-0 RB1 .5 CRCL 36 (SUTURE) ×2 IMPLANT
SUT PROLENE 5 0 C 1 36 (SUTURE) ×6 IMPLANT
SUT PROLENE 6 0 C 1 30 (SUTURE) ×18 IMPLANT
SUT PROLENE 7.0 RB 3 (SUTURE) ×9 IMPLANT
SUT PROLENE 8 0 BV175 6 (SUTURE) ×12 IMPLANT
SUT PROLENE BLUE 7 0 (SUTURE) ×3 IMPLANT
SUT PROLENE POLY MONO (SUTURE) IMPLANT
SUT SILK  1 MH (SUTURE) ×4
SUT SILK 1 MH (SUTURE) ×8 IMPLANT
SUT SILK 1 TIES 10X30 (SUTURE) ×3 IMPLANT
SUT SILK 2 0 SH CR/8 (SUTURE) ×6 IMPLANT
SUT SILK 2 0 TIES 10X30 (SUTURE) ×3 IMPLANT
SUT SILK 2 0 TIES 17X18 (SUTURE) ×1
SUT SILK 2-0 18XBRD TIE BLK (SUTURE) ×2 IMPLANT
SUT SILK 3 0 SH CR/8 (SUTURE) ×3 IMPLANT
SUT SILK 4 0 TIE 10X30 (SUTURE) ×6 IMPLANT
SUT STEEL 6MS V (SUTURE) IMPLANT
SUT STEEL STERNAL CCS#1 18IN (SUTURE) IMPLANT
SUT STEEL SZ 6 DBL 3X14 BALL (SUTURE) IMPLANT
SUT TEM PAC WIRE 2 0 SH (SUTURE) ×12 IMPLANT
SUT VIC AB 1 CTX 36 (SUTURE)
SUT VIC AB 1 CTX36XBRD ANBCTR (SUTURE) IMPLANT
SUT VIC AB 2-0 CT1 27 (SUTURE)
SUT VIC AB 2-0 CT1 TAPERPNT 27 (SUTURE) IMPLANT
SUT VIC AB 2-0 CTX 27 (SUTURE) ×6 IMPLANT
SUT VIC AB 3-0 SH 27 (SUTURE)
SUT VIC AB 3-0 SH 27X BRD (SUTURE) IMPLANT
SUT VIC AB 3-0 X1 27 (SUTURE) ×6 IMPLANT
SUT VICRYL 4-0 PS2 18IN ABS (SUTURE) IMPLANT
SUTURE E-PAK OPEN HEART (SUTURE) IMPLANT
SYS GUIDANT ACHIEVE OFF PUMP (MISCELLANEOUS) ×3 IMPLANT
SYSTEM SAHARA CHEST DRAIN ATS (WOUND CARE) ×3 IMPLANT
TAPES RETRACTO (MISCELLANEOUS) ×3 IMPLANT
TOWEL OR 17X24 6PK STRL BLUE (TOWEL DISPOSABLE) ×6 IMPLANT
TOWEL OR 17X26 10 PK STRL BLUE (TOWEL DISPOSABLE) ×6 IMPLANT
TRAY FOLEY IC TEMP SENS 16FR (CATHETERS) ×3 IMPLANT
TUBING INSUFFLATION (TUBING) IMPLANT
UNDERPAD 30X30 INCONTINENT (UNDERPADS AND DIAPERS) ×3 IMPLANT
WATER STERILE IRR 1000ML POUR (IV SOLUTION) ×6 IMPLANT

## 2015-04-07 NOTE — Anesthesia Preprocedure Evaluation (Addendum)
Anesthesia Evaluation  Patient identified by MRN, date of birth, ID band Patient awake    Reviewed: Allergy & Precautions, NPO status , Patient's Chart, lab work & pertinent test results  History of Anesthesia Complications Negative for: history of anesthetic complications  Airway Mallampati: II  TM Distance: >3 FB Neck ROM: Full    Dental  (+) Teeth Intact, Dental Advisory Given   Pulmonary former smoker,    Pulmonary exam normal        Cardiovascular + angina + CAD and + Past MI  Normal cardiovascular exam  Echo 04/06/15:Study Conclusions  - Left ventricle: The cavity size was normal. Systolic function wasnormal. The estimated ejection fraction was in the range of 55%to 60%. Wall motion was normal; there were no regional wallmotion abnormalities.  Cath: Normal global ejection fraction at 55% with mild distal anterolateral hypocontractility.  High-grade 95% ostial LAD stenosis arising immediately after trifurcation from the left main, ramus intermediate and left circumflex vessel. Mild nonobstructive RCA disease with 20% proximal stenosis followed by diffuse mid 30% luminal irregularity.    Neuro/Psych Multiple Sclerosis negative psych ROS   GI/Hepatic negative GI ROS, Neg liver ROS,   Endo/Other  diabetesMorbid obesity  Renal/GU negative Renal ROS     Musculoskeletal   Abdominal   Peds  Hematology   Anesthesia Other Findings   Reproductive/Obstetrics                           Anesthesia Physical Anesthesia Plan  ASA: IV  Anesthesia Plan: General   Post-op Pain Management:    Induction: Intravenous  Airway Management Planned: Oral ETT  Additional Equipment: Arterial line, PA Cath and 3D TEE  Intra-op Plan:   Post-operative Plan: Post-operative intubation/ventilation  Informed Consent: I have reviewed the patients History and Physical, chart, labs and discussed the  procedure including the risks, benefits and alternatives for the proposed anesthesia with the patient or authorized representative who has indicated his/her understanding and acceptance.   Dental advisory given  Plan Discussed with: CRNA, Anesthesiologist and Surgeon  Anesthesia Plan Comments:        Anesthesia Quick Evaluation

## 2015-04-07 NOTE — Progress Notes (Signed)
Pt extubated at 1455, tolerated well, now on 4L [place]. Preparing to move pt to 2S03.  Henreitta Leber, RN 3:12 PM 04/07/2015

## 2015-04-07 NOTE — Anesthesia Postprocedure Evaluation (Signed)
Anesthesia Post Note  Patient: Kaylee Bryant  Procedure(s) Performed: Procedure(s) (LRB): CORONARY ARTERY BYPASS GRAFTING (CABG), OFF PUMP, TIMES ONE, USING LEFT INTERNAL MAMMARY ARTERY (N/A) TRANSESOPHAGEAL ECHOCARDIOGRAM (TEE) (N/A)  Patient location during evaluation: SICU Anesthesia Type: General Level of consciousness: sedated Pain management: pain level controlled Vital Signs Assessment: post-procedure vital signs reviewed and stable Respiratory status: patient remains intubated per anesthesia plan Cardiovascular status: stable Anesthetic complications: no    Last Vitals:  Filed Vitals:   04/07/15 0000 04/07/15 0439  BP:  124/78  Pulse:    Temp:  36.8 C  Resp: 15     Last Pain:  Filed Vitals:   04/07/15 1156  PainSc: 0-No pain                 SINGER,JAMES DANIEL

## 2015-04-07 NOTE — Procedures (Signed)
Extubation Procedure Note  Patient Details:   Name: Kaylee Bryant DOB: 21-Mar-1966 MRN: [id number]   Airway Documentation:     Evaluation  O2 sats: stable throughout Complications: No apparent complications Patient did tolerate procedure well.     Yes  PT was extubated to 3L  PT was able to speak. NIF(-37) VC (5L)  Schleuning, Leonie Douglas 04/07/2015, 2:58 PM

## 2015-04-07 NOTE — Progress Notes (Signed)
RRT called at 13:58 to attempt weaning again, said would come to PACU after admitting another room, about 10 minutes.  Henreitta Leber, RN 2:00 PM 04/07/2015

## 2015-04-07 NOTE — Transfer of Care (Signed)
Immediate Anesthesia Transfer of Care Note  Patient: Jackeline A Jacquot  Procedure(s) Performed: Procedure(s) with comments: CORONARY ARTERY BYPASS GRAFTING (CABG), OFF PUMP, TIMES ONE, USING LEFT INTERNAL MAMMARY ARTERY (N/A) - LIMA to LAD TRANSESOPHAGEAL ECHOCARDIOGRAM (TEE) (N/A)  Patient Location: PACU  Anesthesia Type:General  Level of Consciousness: Patient remains intubated per anesthesia plan  Airway & Oxygen Therapy: Patient remains intubated per anesthesia plan  Post-op Assessment: Report given to RN and Post -op Vital signs reviewed and stable  Post vital signs: Reviewed and stable  Last Vitals:  Filed Vitals:   04/07/15 0000 04/07/15 0439  BP:  124/78  Pulse:    Temp:  36.8 C  Resp: 15     Complications: No apparent anesthesia complications

## 2015-04-07 NOTE — Op Note (Signed)
CARDIOTHORACIC SURGERY OPERATIVE NOTE  Date of Procedure:  04/07/2015  Preoperative Diagnosis:   Severe Single-vessel Coronary Artery Disease  Acute Coronary Syndrome   S/P Acute Non-STEMI  Postoperative Diagnosis: Same  Procedure:    Off-pump Coronary Artery Bypass Grafting x 1   Left Internal Mammary Artery to Distal Left Anterior Descending Coronary Artery   Surgeon: Valentina Gu. Roxy Manns, MD  Assistant: Ellwood Handler, PA-C  Anesthesia: Duane Boston, MD  Operative Findings:  Normal LV function  Good quality LIMA conduit for grafting  Good quality target vessel for grafting    BRIEF CLINICAL NOTE AND INDICATIONS FOR SURGERY  Patient is a 50 year old female with history of type 2 diabetes mellitus but no previous history of coronary artery disease was admitted to the hospital with a weeklong history of accelerating symptoms of exertional substernal chest pain culminating with several prolonged episodes of chest discomfort over this past weekend that developed with minimal activity and were slow to resolve. On the morning of hospital admission the patient was awoken from her sleep with severe substernal chest pain associated with shortness of breath. She took an aspirin and symptoms subsided a little bit but persisted. EMS was called and sublingual nitroglycerin was administered on their arrival. The patient's symptoms of chest pain subsequently resolved. She was transported to the emergency department where initial EKG was notable for sinus rhythm without acute ST segment changes. Serial troponin levels were weakly positive and trended up to 0.3, consistent with acute coronary syndrome and mild acute non-ST segment elevation myocardial infarction. She has not had any recurrent chest pain since hospital admission. The patient underwent diagnostic cardiac catheterization by Dr. Claiborne Billings demonstrating 95% ostial stenosis of the left anterior descending coronary artery. Left ventricular  systolic function remains preserved. Coronary anatomy is felt to be unfavorable for percutaneous coronary intervention. Cardiothoracic surgical consultation was requested.  The patient has been seen in consultation and counseled at length regarding the indications, risks and potential benefits of surgery.  All questions have been answered, and the patient provides full informed consent for the operation as described.    DETAILS OF THE OPERATIVE PROCEDURE  Preparation:  The patient is brought to the operating room on the above mentioned date and central monitoring was established by the anesthesia team including placement of Swan-Ganz catheter and radial arterial line. The patient is placed in the supine position on the operating table.  Intravenous antibiotics are administered. General endotracheal anesthesia is induced uneventfully. A Foley catheter is placed.  Baseline transesophageal echocardiogram was performed.  Findings were notable for normal LV function  The patient's chest, abdomen, both groins, and both lower extremities are prepared and draped in a sterile manner. A time out procedure is performed.   Surgical Approach and Harvest of Conduit:  A median sternotomy incision was performed and the left internal mammary artery is dissected from the chest wall and prepared for bypass grafting. The left internal mammary artery is notably good quality conduit.  Following systemic heparinization, the left internal mammary artery was transected distally noted to have excellent flow.  The pericardium is opened. The ascending aorta is normal in appearance.  The right pleura is opened widely to facilitate mobilization of the heart.  The [employer] off-pump cardiac stabilization system is utilized to facilitate off-pump coronary revascularization.  Both the apical suction cup and the U-shaped stabilization arm are utilized.  Elastic vessel loops are used for proximal and distal hemostatic control.   Intracoronary shunts are not utilized.   Off-pump  Coronary Artery Bypass Grafting:   The distal left anterior coronary artery was grafted with the left internal mammary artery in an end-to-side fashion.  At the site of distal anastomosis the target vessel was good quality and measured approximately 2.2 mm in diameter.   Procedure Completion:  The heart is allowed to fall back into the pericardial sac.  The patient immediately went into VT/VF and was rapidly cardioverted into sinus rhythm.  The LIMA graft and distal coronary anastomoses were inspected for hemostasis and appropriate graft orientation. The LIMA graft was found to be kinked in the pericardial sac.  This was corrected and sterile Doppler flow probe used to verify good flow through the graft.  No further arrhythmias or other signs of myocardial ischemia developed. Followup transesophageal echocardiogram revealed no changes from the preoperative exam.  Protamine was administered to reverse the anticoagulation.   The mediastinum and pleural space were inspected for hemostasis and irrigated with saline solution. The mediastinum and both pleural spaces were drained using 4 chest tubes placed through separate stab incisions inferiorly.  The soft tissues anterior to the aorta were reapproximated loosely. The sternum is closed with double strength sternal wire. The soft tissues anterior to the sternum were closed in multiple layers and the skin is closed with a running subcuticular skin closure.  No blood products were administered during the operation.   Patient Disposition:  The patient tolerated the procedure well and is transported to the surgical intensive care in stable condition. There are no intraoperative complications. All sponge instrument and needle counts are verified correct at completion of the operation.    Valentina Gu. Roxy Manns MD 04/07/2015 11:10 AM

## 2015-04-07 NOTE — OR Nursing (Signed)
Forty-five minute call to SICU charge nurse at 1022.

## 2015-04-07 NOTE — Progress Notes (Signed)
Pt admitted at 11:45am to PACU.   Henreitta Leber, RN 1:14 PM 04/07/2015

## 2015-04-07 NOTE — Brief Op Note (Addendum)
04/05/2015 - 04/07/2015  10:28 AM  PATIENT:  Kaylee Bryant  50 y.o. female  PRE-OPERATIVE DIAGNOSIS:  CAD  POST-OPERATIVE DIAGNOSIS:  CAD  PROCEDURE:  Procedure(s) with comments:  OFF PUMP CORONARY ARTERY BYPASS GRAFTING x 1 - LIMA to LAD  TRANSESOPHAGEAL ECHOCARDIOGRAM (TEE) (N/A)  SURGEON:    Rexene Alberts, MD  ASSISTANTS:  Ellwood Handler, PA-C  ANESTHESIA:   Duane Boston, MD  FINDINGS:  Normal LV function  Good quality LIMA conduit for grafting  Good quality target vessel for grafting  COMPLICATIONS: None  BASELINE WEIGHT: 102 kg  PATIENT DISPOSITION:   TO SICU IN STABLE CONDITION  Rexene Alberts, MD 04/07/2015 11:06 AM

## 2015-04-07 NOTE — Progress Notes (Signed)
EKG CRITICAL VALUE     12 lead EKG performed.  Critical value noted.  Paris Lore, RN notified.   Martie Lee, CCT 04/07/2015 12:35 PM

## 2015-04-07 NOTE — Anesthesia Procedure Notes (Signed)
Procedure Name: Intubation Date/Time: 04/07/2015 7:54 AM Performed by: Clearnce Sorrel Pre-anesthesia Checklist: Patient identified, Timeout performed, Emergency Drugs available, Suction available and Patient being monitored Patient Re-evaluated:Patient Re-evaluated prior to inductionOxygen Delivery Method: Circle system utilized Preoxygenation: Pre-oxygenation with 100% oxygen Intubation Type: IV induction Ventilation: Mask ventilation without difficulty Laryngoscope Size: Mac and 3 Grade View: Grade I Tube type: Oral Tube size: 8.0 mm Number of attempts: 1 Airway Equipment and Method: Stylet Placement Confirmation: ETT inserted through vocal cords under direct vision,  breath sounds checked- equal and bilateral and positive ETCO2 Secured at: 22 cm Tube secured with: Tape Dental Injury: Teeth and Oropharynx as per pre-operative assessment

## 2015-04-07 NOTE — Progress Notes (Signed)
Pt transferred to 2S03 at 1540.   Henreitta Leber, RN 3:48 PM 04/07/2015

## 2015-04-07 NOTE — Progress Notes (Signed)
Pt alert, responds to commands, moves all extremities as of 13:05.  Precedex down to 0.2 mcg now, RRT called at 13:10 to start weaning.  Henreitta Leber, RN 1:13 PM 04/07/2015

## 2015-04-07 NOTE — Progress Notes (Signed)
ANTICOAGULATION CONSULT NOTE - Follow Up Consult  Pharmacy Consult for Heparin Indication: single vessel CAD   No Known Allergies  Patient Measurements: Height: 5\' 3"  (160 cm) Weight: 228 lb 6.3 oz (103.6 kg) IBW/kg (Calculated) : 52.4 Heparin Dosing Weight: 80 kg  Vital Signs: Temp: 98 F (36.7 C) (01/11 2320) Temp Source: Oral (01/11 2320) BP: 126/88 mmHg (01/11 2148) Pulse Rate: 86 (01/11 1800)  Labs:  Recent Labs  04/05/15 0026 04/05/15 0320  04/05/15 0850 04/05/15 1058 04/06/15 0257 04/06/15 0730 04/06/15 1542 04/06/15 2303  HGB 14.0  --   --   --   --  12.5  --   --   --   HCT 40.5  --   --   --   --  37.3  --   --   --   PLT 230  --   --   --   --  209  --   --   --   APTT  --   --   --   --   --  44*  --   --   --   LABPROT  --   --   --   --  14.6 15.3*  --   --   --   INR  --   --   --   --  1.13 1.19  --   --   --   HEPARINUNFRC  --   --   < >  --   --   --  0.24* 0.40 0.57  CREATININE 0.59  --   --   --   --  0.60  --  0.71  --   TROPONINI  --  0.15*  --  0.30*  --   --   --   --   --   < > = values in this interval not displayed.  Estimated Creatinine Clearance: 99 mL/min (by C-G formula based on Cr of 0.71).  Assessment: 89 YOF who is now s/p cardiac cath which showed single vessel CAD. Plan for CABG today. Heparin level remains therapeutic (0.57) on 1400 units/hr. No bleeding noted.     Goal of Therapy:  Heparin level 0.3-0.7 units/ml Monitor platelets by anticoagulation protocol: Yes   Plan:  -Continue IV heparin at 1400 units/hr -F/u post CABG today  Sherlon Handing, PharmD, BCPS Clinical pharmacist, pager [phone] 04/07/2015 12:21 AM

## 2015-04-07 NOTE — Progress Notes (Signed)
  Echocardiogram Echocardiogram Transesophageal has been performed.  Bobbye Charleston 04/07/2015, 9:01 AM

## 2015-04-07 NOTE — Progress Notes (Signed)
Family (husband and daughter) visited at 70 and updated.   RRT Merry Proud called at 1420, still tied up with another patient. Called Main RRT line at 1422 to see if another RRT could come.  Merry Proud, RRT, just arrived now at 92. Pt now on CPAP/PS. Will re-assess in 20 minutes.  Henreitta Leber, RN 2:25 PM 04/07/2015

## 2015-04-07 NOTE — Progress Notes (Signed)
Pt dangled and stood at bedside at 16:30, tolerated well. Dr. Roxy Manns called and updated at 1635. Family called, visited and informed of visitation policy.   Will continue to monitor pt.  Henreitta Leber, RN 5:12 PM 04/07/2015

## 2015-04-07 NOTE — Progress Notes (Signed)
Patient ID: Kaylee Bryant, female   DOB: 05/29/1965, 50 y.o.   MRN: [id number] EVENING ROUNDS NOTE :     [name]       [place] [address]             [phone]                 Day of Surgery Procedure(s) (LRB): CORONARY ARTERY BYPASS GRAFTING (CABG), OFF PUMP, TIMES ONE, USING LEFT INTERNAL MAMMARY ARTERY (N/A) TRANSESOPHAGEAL ECHOCARDIOGRAM (TEE) (N/A)  Total Length of Stay:  LOS: 2 days  BP 119/78 mmHg  Pulse 88  Temp(Src) 98.2 F (36.8 C) (Oral)  Resp 15  Ht 5\' 3"  (1.6 m)  Wt 224 lb (101.606 kg)  BMI 39.69 kg/m2  SpO2 98%  LMP 10/03/2014  .Intake/Output      01/11 0701 - 01/12 0700 01/12 0701 - 01/13 0700   P.O. 840    I.V. (mL/kg) 948.3 (9.3) 3099.8 (30.5)   IV Piggyback  450   Total Intake(mL/kg) 1788.3 (17.5) 3549.8 (34.9)   Urine (mL/kg/hr) 1250 (0.5) 1160 (1)   Emesis/NG output  0 (0)   Blood  200 (0.2)   Chest Tube  270 (0.2)   Total Output 1250 1630   Net +538.3 +1919.8        Urine Occurrence 4 x      . sodium chloride 10 mL/hr at 04/07/15 1500  . [START ON 04/08/2015] sodium chloride    . sodium chloride Stopped (04/07/15 1200)  . sodium chloride 100 mL/hr at 04/07/15 1500  . dexmedetomidine Stopped (04/07/15 1335)  . insulin (NOVOLIN-R) infusion 7.2 Units/hr (04/07/15 1800)  . lactated ringers 20 mL/hr at 04/07/15 1500  . lactated ringers Stopped (04/07/15 1600)  . nitroGLYCERIN 10 mcg/min (04/07/15 1825)  . phenylephrine (NEO-SYNEPHRINE) Adult infusion Stopped (04/07/15 1130)     Lab Results  Component Value Date   WBC 13.7* 04/07/2015   HGB 12.9 04/07/2015   HCT 38.0 04/07/2015   PLT 233 04/07/2015   GLUCOSE 162* 04/07/2015   CHOL 159 04/06/2015   TRIG 94 04/06/2015   HDL 28* 04/06/2015   LDLCALC 112* 04/06/2015   ALT 75* 04/06/2015   AST 61* 04/06/2015   NA 141 04/07/2015   K 4.0 04/07/2015   CL 105 04/07/2015   CREATININE 0.50 04/07/2015   BUN <3* 04/07/2015   CO2 27 04/06/2015   TSH 1.684 04/05/2015   INR  1.23 04/07/2015   HGBA1C 9.3* 04/06/2015   Stable extubated, neuro intact bp elevated  Grace Isaac MD  Beeper [phone] Office [phone] 04/07/2015 6:27 PM

## 2015-04-07 NOTE — Progress Notes (Signed)
Weaning attempt made at 13:30, pt failed d/t pulling low volumes on 0.2 mcg of Precedex. Precedex turned off, will try again in 20 min. Pt currently on 40/4.  Henreitta Leber, RN 1:40 PM 04/07/2015

## 2015-04-08 ENCOUNTER — Inpatient Hospital Stay (HOSPITAL_COMMUNITY): Payer: Managed Care, Other (non HMO)

## 2015-04-08 ENCOUNTER — Encounter (HOSPITAL_COMMUNITY): Payer: Self-pay | Admitting: Thoracic Surgery (Cardiothoracic Vascular Surgery)

## 2015-04-08 DIAGNOSIS — Z951 Presence of aortocoronary bypass graft: Secondary | ICD-10-CM

## 2015-04-08 LAB — POCT I-STAT 3, ART BLOOD GAS (G3+)
Acid-base deficit: 3 mmol/L — ABNORMAL HIGH (ref 0.0–2.0)
Bicarbonate: 23.6 mEq/L (ref 20.0–24.0)
O2 Saturation: 95 %
Patient temperature: 36.5
TCO2: 25 mmol/L (ref 0–100)
pCO2 arterial: 45.7 mmHg — ABNORMAL HIGH (ref 35.0–45.0)
pH, Arterial: 7.318 — ABNORMAL LOW (ref 7.350–7.450)
pO2, Arterial: 83 mmHg (ref 80.0–100.0)

## 2015-04-08 LAB — BASIC METABOLIC PANEL
Anion gap: 9 (ref 5–15)
Anion gap: 10 (ref 5–15)
BUN: <5 mg/dL — ABNORMAL LOW (ref 6–20)
BUN: <5 mg/dL — ABNORMAL LOW (ref 6–20)
CO2: 25 mmol/L (ref 22–32)
CO2: 27 mmol/L (ref 22–32)
Calcium: 8.3 mg/dL — ABNORMAL LOW (ref 8.9–10.3)
Calcium: 8.7 mg/dL — ABNORMAL LOW (ref 8.9–10.3)
Chloride: 105 mmol/L (ref 101–111)
Chloride: 103 mmol/L (ref 101–111)
Creatinine, Ser: 0.59 mg/dL (ref 0.44–1.00)
Creatinine, Ser: 0.59 mg/dL (ref 0.44–1.00)
GFR calc Af Amer: >60 mL/min (ref >60)
GFR calc Af Amer: >60 mL/min (ref >60)
GFR calc non Af Amer: >60 mL/min (ref >60)
GFR calc non Af Amer: >60 mL/min (ref >60)
Glucose, Bld: 131 mg/dL — ABNORMAL HIGH (ref 65–99)
Glucose, Bld: 149 mg/dL — ABNORMAL HIGH (ref 65–99)
Potassium: 4 mmol/L (ref 3.5–5.1)
Potassium: 4.2 mmol/L (ref 3.5–5.1)
Sodium: 139 mmol/L (ref 135–145)
Sodium: 140 mmol/L (ref 135–145)

## 2015-04-08 LAB — CBC
HCT: 38 % (ref 36.0–46.0)
HCT: 39.3 % (ref 36.0–46.0)
Hemoglobin: 12.7 g/dL (ref 12.0–15.0)
Hemoglobin: 13 g/dL (ref 12.0–15.0)
MCH: 29.9 pg (ref 26.0–34.0)
MCH: 29.6 pg (ref 26.0–34.0)
MCHC: 33.4 g/dL (ref 30.0–36.0)
MCHC: 33.1 g/dL (ref 30.0–36.0)
MCV: 89.4 fL (ref 78.0–100.0)
MCV: 89.5 fL (ref 78.0–100.0)
Platelets: 255 10*3/uL (ref 150–400)
Platelets: 271 10*3/uL (ref 150–400)
RBC: 4.25 MIL/uL (ref 3.87–5.11)
RBC: 4.39 MIL/uL (ref 3.87–5.11)
RDW: 14.6 % (ref 11.5–15.5)
RDW: 14.8 % (ref 11.5–15.5)
WBC: 14.2 10*3/uL — ABNORMAL HIGH (ref 4.0–10.5)
WBC: 12.5 10*3/uL — ABNORMAL HIGH (ref 4.0–10.5)

## 2015-04-08 LAB — BLOOD GAS, ARTERIAL
Acid-base deficit: 2.4 mmol/L — ABNORMAL HIGH (ref 0.0–2.0)
Bicarbonate: 22.7 mEq/L (ref 20.0–24.0)
Drawn by: 418751
O2 Content: 3 L/min
O2 Saturation: 96.6 %
Patient temperature: 98.6
TCO2: 24.1 mmol/L (ref 0–100)
pCO2 arterial: 44.6 mmHg (ref 35.0–45.0)
pH, Arterial: 7.327 — ABNORMAL LOW (ref 7.350–7.450)
pO2, Arterial: 88.6 mmHg (ref 80.0–100.0)

## 2015-04-08 LAB — GLUCOSE, CAPILLARY
Glucose-Capillary: 107 mg/dL — ABNORMAL HIGH (ref 65–99)
Glucose-Capillary: 111 mg/dL — ABNORMAL HIGH (ref 65–99)
Glucose-Capillary: 114 mg/dL — ABNORMAL HIGH (ref 65–99)
Glucose-Capillary: 115 mg/dL — ABNORMAL HIGH (ref 65–99)
Glucose-Capillary: 116 mg/dL — ABNORMAL HIGH (ref 65–99)
Glucose-Capillary: 116 mg/dL — ABNORMAL HIGH (ref 65–99)
Glucose-Capillary: 118 mg/dL — ABNORMAL HIGH (ref 65–99)
Glucose-Capillary: 122 mg/dL — ABNORMAL HIGH (ref 65–99)
Glucose-Capillary: 123 mg/dL — ABNORMAL HIGH (ref 65–99)
Glucose-Capillary: 125 mg/dL — ABNORMAL HIGH (ref 65–99)
Glucose-Capillary: 125 mg/dL — ABNORMAL HIGH (ref 65–99)
Glucose-Capillary: 125 mg/dL — ABNORMAL HIGH (ref 65–99)
Glucose-Capillary: 126 mg/dL — ABNORMAL HIGH (ref 65–99)
Glucose-Capillary: 129 mg/dL — ABNORMAL HIGH (ref 65–99)
Glucose-Capillary: 133 mg/dL — ABNORMAL HIGH (ref 65–99)
Glucose-Capillary: 134 mg/dL — ABNORMAL HIGH (ref 65–99)
Glucose-Capillary: 145 mg/dL — ABNORMAL HIGH (ref 65–99)
Glucose-Capillary: 151 mg/dL — ABNORMAL HIGH (ref 65–99)
Glucose-Capillary: 160 mg/dL — ABNORMAL HIGH (ref 65–99)
Glucose-Capillary: 64 mg/dL — ABNORMAL LOW (ref 65–99)
Glucose-Capillary: 77 mg/dL (ref 65–99)
Glucose-Capillary: 81 mg/dL (ref 65–99)
Glucose-Capillary: 89 mg/dL (ref 65–99)
Glucose-Capillary: 90 mg/dL (ref 65–99)

## 2015-04-08 LAB — POCT I-STAT 7, (LYTES, BLD GAS, ICA,H+H)
Bicarbonate: 24.8 mEq/L — ABNORMAL HIGH (ref 20.0–24.0)
Calcium, Ion: 1.16 mmol/L (ref 1.12–1.23)
HCT: 33 % — ABNORMAL LOW (ref 36.0–46.0)
Hemoglobin: 11.2 g/dL — ABNORMAL LOW (ref 12.0–15.0)
O2 Saturation: 94 %
Patient temperature: 35.8
Potassium: 3.4 mmol/L — ABNORMAL LOW (ref 3.5–5.1)
Sodium: 143 mmol/L (ref 135–145)
TCO2: 26 mmol/L (ref 0–100)
pCO2 arterial: 39.2 mmHg (ref 35.0–45.0)
pH, Arterial: 7.404 (ref 7.350–7.450)
pO2, Arterial: 65 mmHg — ABNORMAL LOW (ref 80.0–100.0)

## 2015-04-08 LAB — MAGNESIUM
Magnesium: 2 mg/dL (ref 1.7–2.4)
Magnesium: 2.2 mg/dL (ref 1.7–2.4)

## 2015-04-08 LAB — POCT I-STAT 4, (NA,K, GLUC, HGB,HCT)
Glucose, Bld: 144 mg/dL — ABNORMAL HIGH (ref 65–99)
HCT: 34 % — ABNORMAL LOW (ref 36.0–46.0)
Hemoglobin: 11.6 g/dL — ABNORMAL LOW (ref 12.0–15.0)
Potassium: 3.4 mmol/L — ABNORMAL LOW (ref 3.5–5.1)
Sodium: 140 mmol/L (ref 135–145)

## 2015-04-08 LAB — CREATININE, SERUM
Creatinine, Ser: 0.59 mg/dL (ref 0.44–1.00)
GFR calc Af Amer: >60 mL/min (ref >60)
GFR calc non Af Amer: >60 mL/min (ref >60)

## 2015-04-08 MED ORDER — SODIUM CHLORIDE 0.9 % IJ SOLN
3.0000 mL | Freq: Two times a day (BID) | INTRAMUSCULAR | Status: DC
  Administered 2015-04-08 – 2015-04-10 (×6): 3 mL via INTRAVENOUS

## 2015-04-08 MED ORDER — METOPROLOL TARTRATE 25 MG PO TABS
25.0000 mg | ORAL_TABLET | Freq: Two times a day (BID) | ORAL | Status: DC
  Administered 2015-04-08: 12.5 mg via ORAL
  Administered 2015-04-08 – 2015-04-11 (×6): 25 mg via ORAL
  Filled 2015-04-08 (×8): qty 1

## 2015-04-08 MED ORDER — TRAZODONE HCL 50 MG PO TABS
50.0000 mg | ORAL_TABLET | Freq: Every day | ORAL | Status: DC
  Administered 2015-04-08 – 2015-04-10 (×3): 50 mg via ORAL
  Filled 2015-04-08 (×3): qty 1

## 2015-04-08 MED ORDER — MOVING RIGHT ALONG BOOK
Freq: Once | Status: AC
  Administered 2015-04-08: 1
  Filled 2015-04-08: qty 1

## 2015-04-08 MED ORDER — METHYLPHENIDATE HCL 5 MG PO TABS: 10.0000 mg | ORAL_TABLET | Freq: Three times a day (TID) | ORAL | Status: DC

## 2015-04-08 MED ORDER — MORPHINE SULFATE (PF) 2 MG/ML IV SOLN
2.0000 mg | INTRAVENOUS | Status: DC | PRN
  Administered 2015-04-08 (×2): 2 mg via INTRAVENOUS
  Filled 2015-04-08 (×2): qty 1

## 2015-04-08 MED ORDER — METHYLPHENIDATE HCL 5 MG PO TABS
10.0000 mg | ORAL_TABLET | Freq: Three times a day (TID) | ORAL | Status: DC
  Administered 2015-04-08 – 2015-04-11 (×5): 10 mg via ORAL
  Filled 2015-04-08 (×7): qty 2

## 2015-04-08 MED ORDER — BACLOFEN 10 MG PO TABS
10.0000 mg | ORAL_TABLET | Freq: Three times a day (TID) | ORAL | Status: DC
  Administered 2015-04-08 – 2015-04-11 (×10): 10 mg via ORAL
  Filled 2015-04-08 (×12): qty 1

## 2015-04-08 MED ORDER — INSULIN ASPART 100 UNIT/ML ~~LOC~~ SOLN
0.0000 [IU] | SUBCUTANEOUS | Status: DC
  Administered 2015-04-08 – 2015-04-09 (×4): 2 [IU] via SUBCUTANEOUS

## 2015-04-08 MED ORDER — INSULIN DETEMIR 100 UNIT/ML ~~LOC~~ SOLN
20.0000 [IU] | Freq: Every day | SUBCUTANEOUS | Status: DC
  Administered 2015-04-09 – 2015-04-11 (×3): 20 [IU] via SUBCUTANEOUS
  Filled 2015-04-08 (×3): qty 0.2

## 2015-04-08 MED ORDER — SODIUM CHLORIDE 0.9 % IV SOLN: 250.0000 mL | INTRAVENOUS | Status: DC | PRN

## 2015-04-08 MED ORDER — INSULIN DETEMIR 100 UNIT/ML ~~LOC~~ SOLN
20.0000 [IU] | Freq: Once | SUBCUTANEOUS | Status: AC
  Administered 2015-04-08: 20 [IU] via SUBCUTANEOUS
  Filled 2015-04-08: qty 0.2

## 2015-04-08 MED ORDER — GABAPENTIN 300 MG PO CAPS
300.0000 mg | ORAL_CAPSULE | Freq: Three times a day (TID) | ORAL | Status: DC
  Administered 2015-04-08 – 2015-04-11 (×10): 300 mg via ORAL
  Filled 2015-04-08 (×10): qty 1

## 2015-04-08 MED ORDER — SODIUM CHLORIDE 0.9 % IJ SOLN: 3.0000 mL | INTRAMUSCULAR | Status: DC | PRN

## 2015-04-08 NOTE — Progress Notes (Signed)
CSW received call from Opal Sidles with [facility] to inform that she is able to assist with any needs pt will have at DC- states they are in network with [facility], [facility], and [facility] home health services.  Contact#: [phone]   No CSW needs at this time  Domenica Reamer, [place] Social Worker [phone]

## 2015-04-08 NOTE — Progress Notes (Addendum)
[address]       [place] [address]             [phone]        CARDIOTHORACIC SURGERY PROGRESS NOTE   R1 Day Post-Op Procedure(s) (LRB): CORONARY ARTERY BYPASS GRAFTING (CABG), OFF PUMP, TIMES ONE, USING LEFT INTERNAL MAMMARY ARTERY (N/A) TRANSESOPHAGEAL ECHOCARDIOGRAM (TEE) (N/A)  Subjective: Looks good.  Mild soreness in chest w/ deep inspiration and cough  Objective: Vital signs: BP Readings from Last 1 Encounters:  04/08/15 127/77   Pulse Readings from Last 1 Encounters:  04/08/15 99   Resp Readings from Last 1 Encounters:  04/08/15 21   Temp Readings from Last 1 Encounters:  04/08/15 98.2 F (36.8 C) Oral    Hemodynamics: PAP: (17-29)/(13-24) 26/18 mmHg  Physical Exam:  Rhythm:   sinus  Breath sounds: clear  Heart sounds:  RRR  Incisions:  Dressing dry, intact  Abdomen:  Soft, non-distended, non-tender  Extremities:  Warm, well-perfused  Chest tubes:  Low volume thin serosanguinous output, no air leak    Intake/Output from previous day: 01/12 0701 - 01/13 0700 In: 4236.6 [I.V.:3736.6; IV Piggyback:500] Out: 2355 [Urine:1645; Blood:200; Chest Tube:510] Intake/Output this shift: Total I/O In: 3 [I.V.:3] Out: -   Lab Results:  CBC: Recent Labs  04/07/15 1757 04/07/15 1803 04/08/15 0405  WBC 13.7*  --  14.2*  HGB 13.6 12.9 12.7  HCT 40.2 38.0 38.0  PLT 233  --  255    BMET:  Recent Labs  04/06/15 1542  04/07/15 1803 04/08/15 0405  NA 141  < > 141 139  K 3.8  < > 4.0 4.0  CL 107  < > 105 105  CO2 27  --   --  25  GLUCOSE 162*  < > 162* 131*  BUN <5*  < > <3* <5*  CREATININE 0.71  < > 0.50 0.59  CALCIUM 8.9  --   --  8.3*  < > = values in this interval not displayed.   PT/INR:   Recent Labs  04/07/15 1158  LABPROT 15.7*  INR 1.23    CBG (last 3)   Recent Labs  04/08/15 0405 04/08/15 0504 04/08/15 0559  GLUCAP 116* 125* 125*    ABG    Component Value Date/Time   PHART 7.301* 04/07/2015  1557   PCO2ART 47.1* 04/07/2015 1557   PO2ART 79.0* 04/07/2015 1557   HCO3 23.3 04/07/2015 1557   TCO2 24 04/07/2015 1803   ACIDBASEDEF 3.0* 04/07/2015 1557   O2SAT 94.0 04/07/2015 1557    CXR: PORTABLE CHEST 1 VIEW  COMPARISON: 04/07/2015  FINDINGS: Endotracheal tube removed. NG tube removed. Swan-Ganz catheter removed. Right jugular sheath remains in the SVC.  Bilateral chest tubes remain in place. Mediastinal drain in place. No pneumothorax  Bibasilar atelectasis is unchanged. Negative for edema or effusion.  IMPRESSION: Endotracheal tube removed. Bilateral chest tubes remain in place.  Bibasilar atelectasis unchanged.   Electronically Signed  By: Franchot Gallo M.D.  On: 04/08/2015 08:06   EKG: NSR w/out acute ischemic changes     Assessment/Plan: S/P Procedure(s) (LRB): CORONARY ARTERY BYPASS GRAFTING (CABG), OFF PUMP, TIMES ONE, USING LEFT INTERNAL MAMMARY ARTERY (N/A) TRANSESOPHAGEAL ECHOCARDIOGRAM (TEE) (N/A)  Doing well POD1 Maintaining NSR w/ stable hemodynamics, no drips except low dose NTG for HTN O2 sats 94% on 4 L/min Expected post op acute blood loss anemia, mild Expected post op atelectasis, mild Type II diabetes mellitus, excellent glycemic control but  still on insulin drip Hypertension Obesity   Mobilize  D/C tubes and lines  Increase metoprolol and wean NTG off  Continue ASA, statin  Add levemir and wean insulin drip off  Restart metformin 1-2 days depending on oral intake  Transfer step down later today or am tomorrow   Rexene Alberts, MD 04/08/2015 9:36 AM

## 2015-04-08 NOTE — Progress Notes (Signed)
Utilization Review Completed.  

## 2015-04-08 NOTE — Progress Notes (Addendum)
Subjective:  POD # 1 CABG X 1 (Kaylee Bryant) off pump [place]A-->> LAD. Looks great!! No complaints  Objective:  Temp:  [96.4 F (35.8 C)-98.5 F (36.9 C)] 98.4 F (36.9 C) (01/13 0401) Pulse Rate:  [73-104] 95 (01/13 0715) Resp:  [0-33] 12 (01/13 0715) BP: (94-144)/(48-90) 114/74 mmHg (01/13 0715) SpO2:  [93 %-100 %] 94 % (01/13 0715) Arterial Line BP: (78-276)/(59-272) 157/67 mmHg (01/13 0715) FiO2 (%):  [40 %-50 %] 40 % (01/12 1335) Weight:  [224 lb (101.606 kg)-226 lb 3.1 oz (102.6 kg)] 226 lb 3.1 oz (102.6 kg) (01/13 0500) Weight change: -1 lb 12 oz (-0.794 kg)  Intake/Output from previous day: 01/12 0701 - 01/13 0700 In: 4236.6 [I.V.:3736.6; IV Piggyback:500] Out: 2355 [Urine:1645; Blood:200; Chest Tube:510]  Intake/Output from this shift:    Physical Exam: General appearance: alert and no distress Neck: no adenopathy, no carotid bruit, no JVD, supple, symmetrical, trachea midline and thyroid not enlarged, symmetric, no tenderness/mass/nodules Lungs: clear to auscultation bilaterally Heart: regular rate and rhythm, S1, S2 normal, no murmur, click, rub or gallop Extremities: extremities normal, atraumatic, no cyanosis or edema  Lab Results: Results for orders placed or performed during the hospital encounter of 04/05/15 (from the past 48 hour(s))  Glucose, capillary     Status: Abnormal   Collection Time: 04/06/15  9:51 AM  Result Value Ref Range   Glucose-Capillary 186 (H) 65 - 99 mg/dL  Urinalysis, Routine w reflex microscopic (not at [facility])     Status: None   Collection Time: 04/06/15 12:30 PM  Result Value Ref Range   Color, Urine YELLOW YELLOW   APPearance CLEAR CLEAR   Specific Gravity, Urine 1.005 1.005 - 1.030   pH 6.0 5.0 - 8.0   Glucose, UA NEGATIVE NEGATIVE mg/dL   Hgb urine dipstick NEGATIVE NEGATIVE   Bilirubin Urine NEGATIVE NEGATIVE   Ketones, ur NEGATIVE NEGATIVE mg/dL   Protein, ur NEGATIVE NEGATIVE mg/dL   Nitrite NEGATIVE NEGATIVE   Leukocytes,  UA NEGATIVE NEGATIVE    Comment: MICROSCOPIC NOT DONE ON URINES WITH NEGATIVE PROTEIN, BLOOD, LEUKOCYTES, NITRITE, OR GLUCOSE <1000 mg/dL.  Glucose, capillary     Status: Abnormal   Collection Time: 04/06/15  1:09 PM  Result Value Ref Range   Glucose-Capillary 128 (H) 65 - 99 mg/dL  Blood gas, arterial     Status: Abnormal   Collection Time: 04/06/15  2:55 PM  Result Value Ref Range   FIO2 0.21    Delivery systems ROOM AIR    pH, Arterial 7.396 7.350 - 7.450   pCO2 arterial 41.5 35.0 - 45.0 mmHg   pO2, Arterial 69.1 (L) 80.0 - 100.0 mmHg   Bicarbonate 24.9 (H) 20.0 - 24.0 mEq/L   TCO2 26.2 0 - 100 mmol/L   Acid-Base Excess 0.6 0.0 - 2.0 mmol/L   O2 Saturation 93.9 %   Patient temperature 98.6    Collection site RIGHT RADIAL    Drawn by [phone]    Sample type ARTERIAL DRAW    Allens test (pass/fail) PASS PASS    Comment: Performed at [facility]  Heparin level (unfractionated)     Status: None   Collection Time: 04/06/15  3:42 PM  Result Value Ref Range   Heparin Unfractionated 0.40 0.30 - 0.70 IU/mL    Comment:        IF HEPARIN RESULTS ARE BELOW EXPECTED VALUES, AND PATIENT DOSAGE HAS BEEN CONFIRMED, SUGGEST FOLLOW UP TESTING OF ANTITHROMBIN III LEVELS.   Comprehensive metabolic  panel     Status: Abnormal   Collection Time: 04/06/15  3:42 PM  Result Value Ref Range   Sodium 141 135 - 145 mmol/L   Potassium 3.8 3.5 - 5.1 mmol/L   Chloride 107 101 - 111 mmol/L   CO2 27 22 - 32 mmol/L   Glucose, Bld 162 (H) 65 - 99 mg/dL   BUN <5 (L) 6 - 20 mg/dL   Creatinine, Ser 0.71 0.44 - 1.00 mg/dL   Calcium 8.9 8.9 - 10.3 mg/dL   Total Protein 6.5 6.5 - 8.1 g/dL   Albumin 3.6 3.5 - 5.0 g/dL   AST 61 (H) 15 - 41 U/L   ALT 75 (H) 14 - 54 U/L   Alkaline Phosphatase 55 38 - 126 U/L   Total Bilirubin 0.8 0.3 - 1.2 mg/dL   GFR calc non Af Amer >60 >60 mL/min   GFR calc Af Amer >60 >60 mL/min    Comment: (NOTE) The eGFR has been calculated using the CKD EPI  equation. This calculation has not been validated in all clinical situations. eGFR's persistently <60 mL/min signify possible Chronic Kidney Disease.    Anion gap 7 5 - 15  Glucose, capillary     Status: Abnormal   Collection Time: 04/06/15  5:24 PM  Result Value Ref Range   Glucose-Capillary 133 (H) 65 - 99 mg/dL  Heparin level (unfractionated)     Status: None   Collection Time: 04/06/15 11:03 PM  Result Value Ref Range   Heparin Unfractionated 0.57 0.30 - 0.70 IU/mL    Comment:        IF HEPARIN RESULTS ARE BELOW EXPECTED VALUES, AND PATIENT DOSAGE HAS BEEN CONFIRMED, SUGGEST FOLLOW UP TESTING OF ANTITHROMBIN III LEVELS.   CBC     Status: None   Collection Time: 04/07/15  2:08 AM  Result Value Ref Range   WBC 7.2 4.0 - 10.5 K/uL   RBC 4.13 3.87 - 5.11 MIL/uL   Hemoglobin 12.2 12.0 - 15.0 g/dL   HCT 36.4 36.0 - 46.0 %   MCV 88.1 78.0 - 100.0 fL   MCH 29.5 26.0 - 34.0 pg   MCHC 33.5 30.0 - 36.0 g/dL   RDW 14.2 11.5 - 15.5 %   Platelets 202 150 - 400 K/uL  Glucose, capillary     Status: Abnormal   Collection Time: 04/07/15  5:36 AM  Result Value Ref Range   Glucose-Capillary 149 (H) 65 - 99 mg/dL   Comment 1 Capillary Specimen   I-STAT, chem 8     Status: Abnormal   Collection Time: 04/07/15  8:04 AM  Result Value Ref Range   Sodium 141 135 - 145 mmol/L   Potassium 3.7 3.5 - 5.1 mmol/L   Chloride 104 101 - 111 mmol/L   BUN <3 (L) 6 - 20 mg/dL   Creatinine, Ser 0.40 (L) 0.44 - 1.00 mg/dL   Glucose, Bld 165 (H) 65 - 99 mg/dL   Calcium, Ion 1.18 1.12 - 1.23 mmol/L   TCO2 27 0 - 100 mmol/L   Hemoglobin 12.6 12.0 - 15.0 g/dL   HCT 37.0 36.0 - 46.0 %  I-STAT, chem 8     Status: Abnormal   Collection Time: 04/07/15  9:47 AM  Result Value Ref Range   Sodium 141 135 - 145 mmol/L   Potassium 3.6 3.5 - 5.1 mmol/L   Chloride 104 101 - 111 mmol/L   BUN <3 (L) 6 - 20 mg/dL   Creatinine, Ser 0.30 (  L) 0.44 - 1.00 mg/dL   Glucose, Bld 169 (H) 65 - 99 mg/dL   Calcium, Ion  1.15 1.12 - 1.23 mmol/L   TCO2 27 0 - 100 mmol/L   Hemoglobin 11.6 (L) 12.0 - 15.0 g/dL   HCT 34.0 (L) 36.0 - 46.0 %  I-STAT 4, (NA,K, GLUC, HGB,HCT)     Status: Abnormal   Collection Time: 04/07/15 11:47 AM  Result Value Ref Range   Sodium 140 135 - 145 mmol/L   Potassium 3.4 (L) 3.5 - 5.1 mmol/L   Glucose, Bld 144 (H) 65 - 99 mg/dL   HCT 34.0 (L) 36.0 - 46.0 %   Hemoglobin 11.6 (L) 12.0 - 15.0 g/dL  I-STAT 7, (LYTES, BLD GAS, ICA, H+H)     Status: Abnormal   Collection Time: 04/07/15 11:54 AM  Result Value Ref Range   pH, Arterial 7.404 7.350 - 7.450   pCO2 arterial 39.2 35.0 - 45.0 mmHg   pO2, Arterial 65.0 (L) 80.0 - 100.0 mmHg   Bicarbonate 24.8 (H) 20.0 - 24.0 mEq/L   TCO2 26 0 - 100 mmol/L   O2 Saturation 94.0 %   Sodium 143 135 - 145 mmol/L   Potassium 3.4 (L) 3.5 - 5.1 mmol/L   Calcium, Ion 1.16 1.12 - 1.23 mmol/L   HCT 33.0 (L) 36.0 - 46.0 %   Hemoglobin 11.2 (L) 12.0 - 15.0 g/dL   Patient temperature 35.8 C    Collection site ARTERIAL LINE    Drawn by Nurse    Sample type ARTERIAL   CBC     Status: Abnormal   Collection Time: 04/07/15 11:58 AM  Result Value Ref Range   WBC 7.4 4.0 - 10.5 K/uL   RBC 3.98 3.87 - 5.11 MIL/uL   Hemoglobin 11.7 (L) 12.0 - 15.0 g/dL   HCT 35.0 (L) 36.0 - 46.0 %   MCV 87.9 78.0 - 100.0 fL   MCH 29.4 26.0 - 34.0 pg   MCHC 33.4 30.0 - 36.0 g/dL   RDW 14.0 11.5 - 15.5 %   Platelets 169 150 - 400 K/uL  Protime-INR     Status: Abnormal   Collection Time: 04/07/15 11:58 AM  Result Value Ref Range   Prothrombin Time 15.7 (H) 11.6 - 15.2 seconds   INR 1.23 0.00 - 1.49  APTT     Status: None   Collection Time: 04/07/15 11:58 AM  Result Value Ref Range   aPTT 27 24 - 37 seconds  Glucose, capillary     Status: Abnormal   Collection Time: 04/07/15 12:37 PM  Result Value Ref Range   Glucose-Capillary 125 (H) 65 - 99 mg/dL  Glucose, capillary     Status: Abnormal   Collection Time: 04/07/15  1:51 PM  Result Value Ref Range    Glucose-Capillary 137 (H) 65 - 99 mg/dL  Glucose, capillary     Status: Abnormal   Collection Time: 04/07/15  2:45 PM  Result Value Ref Range   Glucose-Capillary 138 (H) 65 - 99 mg/dL  I-STAT 3, arterial blood gas (G3+)     Status: Abnormal   Collection Time: 04/07/15  2:48 PM  Result Value Ref Range   pH, Arterial 7.318 (L) 7.350 - 7.450   pCO2 arterial 45.7 (H) 35.0 - 45.0 mmHg   pO2, Arterial 83.0 80.0 - 100.0 mmHg   Bicarbonate 23.6 20.0 - 24.0 mEq/L   TCO2 25 0 - 100 mmol/L   O2 Saturation 95.0 %   Acid-base deficit 3.0 (H)  0.0 - 2.0 mmol/L   Patient temperature 36.5 C    Collection site ARTERIAL LINE    Drawn by Nurse    Sample type ARTERIAL   Glucose, capillary     Status: Abnormal   Collection Time: 04/07/15  3:54 PM  Result Value Ref Range   Glucose-Capillary 149 (H) 65 - 99 mg/dL   Comment 1 Arterial Specimen    Comment 2 Notify RN   I-STAT 3, arterial blood gas (G3+)     Status: Abnormal   Collection Time: 04/07/15  3:57 PM  Result Value Ref Range   pH, Arterial 7.301 (L) 7.350 - 7.450   pCO2 arterial 47.1 (H) 35.0 - 45.0 mmHg   pO2, Arterial 79.0 (L) 80.0 - 100.0 mmHg   Bicarbonate 23.3 20.0 - 24.0 mEq/L   TCO2 25 0 - 100 mmol/L   O2 Saturation 94.0 %   Acid-base deficit 3.0 (H) 0.0 - 2.0 mmol/L   Patient temperature 36.8 C    Collection site ARTERIAL LINE    Drawn by Nurse    Sample type ARTERIAL   Glucose, capillary     Status: Abnormal   Collection Time: 04/07/15  4:59 PM  Result Value Ref Range   Glucose-Capillary 147 (H) 65 - 99 mg/dL   Comment 1 Arterial Specimen    Comment 2 Notify RN   CBC     Status: Abnormal   Collection Time: 04/07/15  5:57 PM  Result Value Ref Range   WBC 13.7 (H) 4.0 - 10.5 K/uL   RBC 4.56 3.87 - 5.11 MIL/uL   Hemoglobin 13.6 12.0 - 15.0 g/dL   HCT 40.2 36.0 - 46.0 %   MCV 88.2 78.0 - 100.0 fL   MCH 29.8 26.0 - 34.0 pg   MCHC 33.8 30.0 - 36.0 g/dL   RDW 14.2 11.5 - 15.5 %   Platelets 233 150 - 400 K/uL  Magnesium      Status: None   Collection Time: 04/07/15  5:57 PM  Result Value Ref Range   Magnesium 2.3 1.7 - 2.4 mg/dL  Creatinine, serum     Status: None   Collection Time: 04/07/15  5:57 PM  Result Value Ref Range   Creatinine, Ser 0.55 0.44 - 1.00 mg/dL   GFR calc non Af Amer >60 >60 mL/min   GFR calc Af Amer >60 >60 mL/min    Comment: (NOTE) The eGFR has been calculated using the CKD EPI equation. This calculation has not been validated in all clinical situations. eGFR's persistently <60 mL/min signify possible Chronic Kidney Disease.   Glucose, capillary     Status: Abnormal   Collection Time: 04/07/15  6:00 PM  Result Value Ref Range   Glucose-Capillary 150 (H) 65 - 99 mg/dL   Comment 1 Arterial Specimen    Comment 2 Notify RN   I-STAT, chem 8     Status: Abnormal   Collection Time: 04/07/15  6:03 PM  Result Value Ref Range   Sodium 141 135 - 145 mmol/L   Potassium 4.0 3.5 - 5.1 mmol/L   Chloride 105 101 - 111 mmol/L   BUN <3 (L) 6 - 20 mg/dL   Creatinine, Ser 0.50 0.44 - 1.00 mg/dL   Glucose, Bld 162 (H) 65 - 99 mg/dL   Calcium, Ion 1.17 1.12 - 1.23 mmol/L   TCO2 24 0 - 100 mmol/L   Hemoglobin 12.9 12.0 - 15.0 g/dL   HCT 38.0 36.0 - 46.0 %  Glucose, capillary  Status: Abnormal   Collection Time: 04/07/15  6:45 PM  Result Value Ref Range   Glucose-Capillary 160 (H) 65 - 99 mg/dL  Glucose, capillary     Status: Abnormal   Collection Time: 04/07/15  7:49 PM  Result Value Ref Range   Glucose-Capillary 151 (H) 65 - 99 mg/dL  Glucose, capillary     Status: Abnormal   Collection Time: 04/07/15  8:51 PM  Result Value Ref Range   Glucose-Capillary 134 (H) 65 - 99 mg/dL  Glucose, capillary     Status: Abnormal   Collection Time: 04/07/15  9:44 PM  Result Value Ref Range   Glucose-Capillary 122 (H) 65 - 99 mg/dL  Glucose, capillary     Status: Abnormal   Collection Time: 04/07/15 10:51 PM  Result Value Ref Range   Glucose-Capillary 115 (H) 65 - 99 mg/dL  Glucose, capillary      Status: Abnormal   Collection Time: 04/08/15 12:54 AM  Result Value Ref Range   Glucose-Capillary 64 (L) 65 - 99 mg/dL  Glucose, capillary     Status: None   Collection Time: 04/08/15 12:57 AM  Result Value Ref Range   Glucose-Capillary 89 65 - 99 mg/dL   Comment 1 Arterial Specimen   Glucose, capillary     Status: Abnormal   Collection Time: 04/08/15  1:54 AM  Result Value Ref Range   Glucose-Capillary 118 (H) 65 - 99 mg/dL   Comment 1 Arterial Specimen   Glucose, capillary     Status: Abnormal   Collection Time: 04/08/15  3:00 AM  Result Value Ref Range   Glucose-Capillary 125 (H) 65 - 99 mg/dL   Comment 1 Arterial Specimen   CBC     Status: Abnormal   Collection Time: 04/08/15  4:05 AM  Result Value Ref Range   WBC 14.2 (H) 4.0 - 10.5 K/uL   RBC 4.25 3.87 - 5.11 MIL/uL   Hemoglobin 12.7 12.0 - 15.0 g/dL   HCT 38.0 36.0 - 46.0 %   MCV 89.4 78.0 - 100.0 fL   MCH 29.9 26.0 - 34.0 pg   MCHC 33.4 30.0 - 36.0 g/dL   RDW 14.6 11.5 - 15.5 %   Platelets 255 150 - 400 K/uL  Basic metabolic panel     Status: Abnormal   Collection Time: 04/08/15  4:05 AM  Result Value Ref Range   Sodium 139 135 - 145 mmol/L   Potassium 4.0 3.5 - 5.1 mmol/L   Chloride 105 101 - 111 mmol/L   CO2 25 22 - 32 mmol/L   Glucose, Bld 131 (H) 65 - 99 mg/dL   BUN <5 (L) 6 - 20 mg/dL   Creatinine, Ser 0.59 0.44 - 1.00 mg/dL   Calcium 8.3 (L) 8.9 - 10.3 mg/dL   GFR calc non Af Amer >60 >60 mL/min   GFR calc Af Amer >60 >60 mL/min    Comment: (NOTE) The eGFR has been calculated using the CKD EPI equation. This calculation has not been validated in all clinical situations. eGFR's persistently <60 mL/min signify possible Chronic Kidney Disease.    Anion gap 9 5 - 15  Magnesium     Status: None   Collection Time: 04/08/15  4:05 AM  Result Value Ref Range   Magnesium 2.0 1.7 - 2.4 mg/dL  Glucose, capillary     Status: Abnormal   Collection Time: 04/08/15  4:05 AM  Result Value Ref Range    Glucose-Capillary 116 (H) 65 - 99 mg/dL  Glucose, capillary     Status: Abnormal   Collection Time: 04/08/15  5:04 AM  Result Value Ref Range   Glucose-Capillary 125 (H) 65 - 99 mg/dL  Glucose, capillary     Status: Abnormal   Collection Time: 04/08/15  5:59 AM  Result Value Ref Range   Glucose-Capillary 125 (H) 65 - 99 mg/dL    Imaging: Imaging results have been reviewed  Assessment/Plan:   1. Principal Problem: 2.   S/P CABG x 1 3. Active Problems: 4.   Chest pain 5.   NSTEMI (non-ST elevated myocardial infarction) ([place]) 6.   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   Obesity 8.   Time Spent Directly with Patient:  20 minutes  Length of Stay:  LOS: 3 days   Looks great !!! POD #1 CABG X 1 (Off pump LIMA-->> LAD). Looks great!!! VSS. Not on pressors. Exam benign. Labs OK. Tubes prob out later today. Nl progression per TCTS.  Kaylee Bryant 04/08/2015, 8:45 AM

## 2015-04-09 ENCOUNTER — Inpatient Hospital Stay (HOSPITAL_COMMUNITY): Payer: Managed Care, Other (non HMO)

## 2015-04-09 LAB — CBC
HCT: 40.8 % (ref 36.0–46.0)
Hemoglobin: 13.1 g/dL (ref 12.0–15.0)
MCH: 29.5 pg (ref 26.0–34.0)
MCHC: 32.1 g/dL (ref 30.0–36.0)
MCV: 91.9 fL (ref 78.0–100.0)
Platelets: 237 10*3/uL (ref 150–400)
RBC: 4.44 MIL/uL (ref 3.87–5.11)
RDW: 15 % (ref 11.5–15.5)
WBC: 14.7 10*3/uL — ABNORMAL HIGH (ref 4.0–10.5)

## 2015-04-09 LAB — GLUCOSE, CAPILLARY
Glucose-Capillary: 105 mg/dL — ABNORMAL HIGH (ref 65–99)
Glucose-Capillary: 119 mg/dL — ABNORMAL HIGH (ref 65–99)
Glucose-Capillary: 132 mg/dL — ABNORMAL HIGH (ref 65–99)
Glucose-Capillary: 134 mg/dL — ABNORMAL HIGH (ref 65–99)
Glucose-Capillary: 152 mg/dL — ABNORMAL HIGH (ref 65–99)
Glucose-Capillary: 159 mg/dL — ABNORMAL HIGH (ref 65–99)

## 2015-04-09 LAB — BASIC METABOLIC PANEL
Anion gap: 7 (ref 5–15)
BUN: 5 mg/dL — ABNORMAL LOW (ref 6–20)
CO2: 30 mmol/L (ref 22–32)
Calcium: 8.6 mg/dL — ABNORMAL LOW (ref 8.9–10.3)
Chloride: 103 mmol/L (ref 101–111)
Creatinine, Ser: 0.53 mg/dL (ref 0.44–1.00)
GFR calc Af Amer: >60 mL/min (ref >60)
GFR calc non Af Amer: >60 mL/min (ref >60)
Glucose, Bld: 158 mg/dL — ABNORMAL HIGH (ref 65–99)
Potassium: 4.6 mmol/L (ref 3.5–5.1)
Sodium: 140 mmol/L (ref 135–145)

## 2015-04-09 MED ORDER — SODIUM CHLORIDE 0.9 % IJ SOLN: 3.0000 mL | INTRAMUSCULAR | Status: DC | PRN

## 2015-04-09 MED ORDER — OXYCODONE HCL 5 MG PO TABS: 5.0000 mg | ORAL_TABLET | ORAL | Status: DC | PRN

## 2015-04-09 MED ORDER — FUROSEMIDE 40 MG PO TABS
40.0000 mg | ORAL_TABLET | Freq: Every day | ORAL | Status: DC
  Administered 2015-04-09 – 2015-04-11 (×3): 40 mg via ORAL
  Filled 2015-04-09 (×4): qty 1

## 2015-04-09 MED ORDER — MOVING RIGHT ALONG BOOK
Freq: Once | Status: AC
Start: 2015-04-09 — End: 2015-04-09
  Administered 2015-04-09: 10:00:00
  Filled 2015-04-09: qty 1

## 2015-04-09 MED ORDER — POTASSIUM CHLORIDE CRYS ER 20 MEQ PO TBCR
20.0000 meq | EXTENDED_RELEASE_TABLET | Freq: Every day | ORAL | Status: DC
  Administered 2015-04-09: 20 meq via ORAL
  Filled 2015-04-09 (×4): qty 1

## 2015-04-09 MED ORDER — SODIUM CHLORIDE 0.9 % IJ SOLN
3.0000 mL | Freq: Two times a day (BID) | INTRAMUSCULAR | Status: DC
  Administered 2015-04-09 – 2015-04-10 (×3): 3 mL via INTRAVENOUS

## 2015-04-09 MED ORDER — INSULIN ASPART 100 UNIT/ML ~~LOC~~ SOLN
0.0000 [IU] | Freq: Three times a day (TID) | SUBCUTANEOUS | Status: DC
  Administered 2015-04-09 – 2015-04-11 (×5): 2 [IU] via SUBCUTANEOUS

## 2015-04-09 MED ORDER — MAGNESIUM HYDROXIDE 400 MG/5ML PO SUSP: 30.0000 mL | Freq: Every day | ORAL | Status: DC | PRN

## 2015-04-09 MED ORDER — LACTATED RINGERS IV SOLN: INTRAVENOUS | Status: DC

## 2015-04-09 MED ORDER — SODIUM CHLORIDE 0.9 % IV SOLN: 250.0000 mL | INTRAVENOUS | Status: DC | PRN

## 2015-04-09 NOTE — Progress Notes (Signed)
Pt refuses CPAP. States she does not wear one at home.

## 2015-04-09 NOTE — Progress Notes (Signed)
2 Days Post-Op Procedure(s) (LRB): CORONARY ARTERY BYPASS GRAFTING (CABG), OFF PUMP, TIMES ONE, USING LEFT INTERNAL MAMMARY ARTERY (N/A) TRANSESOPHAGEAL ECHOCARDIOGRAM (TEE) (N/A) Subjective: doing well nsr, ambulating Over narcotized last pm- meds reduced Will transfer to stepdown Objective: Vital signs in last 24 hours: Temp:  [97.6 F (36.4 C)-98.7 F (37.1 C)] 98.6 F (37 C) (01/14 0748) Pulse Rate:  [86-103] 91 (01/14 0800) Cardiac Rhythm:  [-] Normal sinus rhythm (01/14 0739) Resp:  [5-23] 9 (01/14 0800) BP: (111-148)/(65-97) 144/79 mmHg (01/14 0800) SpO2:  [82 %-100 %] 97 % (01/14 0800) Arterial Line BP: (95-127)/(82-93) 127/85 mmHg (01/13 1200) Weight:  [229 lb (103.874 kg)] 229 lb (103.874 kg) (01/14 0300)  Hemodynamic parameters for last 24 hours:  nsr  Intake/Output from previous day: 01/13 0701 - 01/14 0700 In: 813.5 [P.O.:240; I.V.:473.5; IV Piggyback:100] Out: 1085 [Urine:1025; Chest Tube:60] Intake/Output this shift:    Lungs clear abd soft  Lab Results:  Recent Labs  04/08/15 1642 04/09/15 0254  WBC 12.5* 14.7*  HGB 13.0 13.1  HCT 39.3 40.8  PLT 271 237   BMET:  Recent Labs  04/08/15 1642 04/09/15 0254  NA 140 140  K 4.2 4.6  CL 103 103  CO2 27 30  GLUCOSE 149* 158*  BUN <5* 5*  CREATININE 0.59  0.59 0.53  CALCIUM 8.7* 8.6*    PT/INR:  Recent Labs  04/07/15 1158  LABPROT 15.7*  INR 1.23   ABG    Component Value Date/Time   PHART 7.327* 04/08/2015 0400   HCO3 22.7 04/08/2015 0400   TCO2 24.1 04/08/2015 0400   ACIDBASEDEF 2.4* 04/08/2015 0400   O2SAT 96.6 04/08/2015 0400   CBG (last 3)   Recent Labs  04/08/15 2342 04/09/15 0418 04/09/15 0747  GLUCAP 159* 134* 119*    Assessment/Plan: S/P Procedure(s) (LRB): CORONARY ARTERY BYPASS GRAFTING (CABG), OFF PUMP, TIMES ONE, USING LEFT INTERNAL MAMMARY ARTERY (N/A) TRANSESOPHAGEAL ECHOCARDIOGRAM (TEE) (N/A) Mobilize Diuresis Diabetes control Plan for transfer to  step-down: see transfer orders   LOS: 4 days    Kaylee Bryant 04/09/2015

## 2015-04-09 NOTE — Progress Notes (Signed)
CT surgery p.m. Rounds  Patient had a stable day and is waiting for transfer to step down bed. Maintaining sinus rhythm.Blood pressure 134/86, pulse 85, temperature 97.7 F (36.5 C), temperature source Oral, resp. rate 17, height 5\' 3"  (1.6 m), weight 229 lb (103.874 kg),, SpO2 92 %.

## 2015-04-10 ENCOUNTER — Inpatient Hospital Stay (HOSPITAL_COMMUNITY): Payer: Managed Care, Other (non HMO)

## 2015-04-10 LAB — BASIC METABOLIC PANEL
Anion gap: 9 (ref 5–15)
BUN: 7 mg/dL (ref 6–20)
CO2: 32 mmol/L (ref 22–32)
Calcium: 8.4 mg/dL — ABNORMAL LOW (ref 8.9–10.3)
Chloride: 98 mmol/L — ABNORMAL LOW (ref 101–111)
Creatinine, Ser: 0.47 mg/dL (ref 0.44–1.00)
GFR calc Af Amer: >60 mL/min (ref >60)
GFR calc non Af Amer: >60 mL/min (ref >60)
Glucose, Bld: 117 mg/dL — ABNORMAL HIGH (ref 65–99)
Potassium: 3.7 mmol/L (ref 3.5–5.1)
Sodium: 139 mmol/L (ref 135–145)

## 2015-04-10 LAB — CBC
HCT: 35.4 % — ABNORMAL LOW (ref 36.0–46.0)
Hemoglobin: 11.6 g/dL — ABNORMAL LOW (ref 12.0–15.0)
MCH: 29.4 pg (ref 26.0–34.0)
MCHC: 32.8 g/dL (ref 30.0–36.0)
MCV: 89.8 fL (ref 78.0–100.0)
Platelets: 206 10*3/uL (ref 150–400)
RBC: 3.94 MIL/uL (ref 3.87–5.11)
RDW: 14.6 % (ref 11.5–15.5)
WBC: 10.1 10*3/uL (ref 4.0–10.5)

## 2015-04-10 LAB — GLUCOSE, CAPILLARY
Glucose-Capillary: 105 mg/dL — ABNORMAL HIGH (ref 65–99)
Glucose-Capillary: 152 mg/dL — ABNORMAL HIGH (ref 65–99)
Glucose-Capillary: 131 mg/dL — ABNORMAL HIGH (ref 65–99)
Glucose-Capillary: 95 mg/dL (ref 65–99)

## 2015-04-10 MED ORDER — POTASSIUM CHLORIDE 10 MEQ/50ML IV SOLN: 10.0000 meq | INTRAVENOUS | Status: DC | PRN

## 2015-04-10 MED ORDER — POTASSIUM CHLORIDE CRYS ER 20 MEQ PO TBCR
20.0000 meq | EXTENDED_RELEASE_TABLET | ORAL | Status: DC | PRN
  Administered 2015-04-10: 20 meq via ORAL
  Filled 2015-04-10: qty 1

## 2015-04-10 MED ORDER — POTASSIUM CHLORIDE CRYS ER 20 MEQ PO TBCR
20.0000 meq | EXTENDED_RELEASE_TABLET | Freq: Two times a day (BID) | ORAL | Status: DC
  Administered 2015-04-10 – 2015-04-11 (×3): 20 meq via ORAL
  Filled 2015-04-10 (×2): qty 1

## 2015-04-10 NOTE — Progress Notes (Signed)
3 Days Post-Op Procedure(s) (LRB): CORONARY ARTERY BYPASS GRAFTING (CABG), OFF PUMP, TIMES ONE, USING LEFT INTERNAL MAMMARY ARTERY (N/A) TRANSESOPHAGEAL ECHOCARDIOGRAM (TEE) (N/A) Subjective: Continues to progress after CABG Pulmonary status stable maintaining sinus rhythm Walking 300 feet We'll transfer to telemetry bed instead of stepdown Blood sugars well-controlled  Objective: Vital signs in last 24 hours: Temp:  [97.5 F (36.4 C)-98.3 F (36.8 C)] 98.2 F (36.8 C) (01/15 1132) Pulse Rate:  [70-95] 92 (01/15 1100) Cardiac Rhythm:  [-] Normal sinus rhythm (01/15 1121) Resp:  [10-21] 14 (01/15 1100) BP: (90-153)/(49-91) 144/73 mmHg (01/15 1100) SpO2:  [92 %-100 %] 94 % (01/15 1100) Weight:  [229 lb 0.9 oz (103.9 kg)] 229 lb 0.9 oz (103.9 kg) (01/15 0500)  Hemodynamic parameters for last 24 hours:   stable  Intake/Output from previous day: 01/14 0701 - 01/15 0700 In: 1020 [P.O.:1020] Out: 2651 [Urine:2650; Stool:1] Intake/Output this shift: Total I/O In: 200 [P.O.:200] Out: 150 [Urine:150]  Lungs clear Surgical incisions clean and dry Minimal edema Chest x-ray clear  Lab Results:  Recent Labs  04/09/15 0254 04/10/15 0200  WBC 14.7* 10.1  HGB 13.1 11.6*  HCT 40.8 35.4*  PLT 237 206   BMET:  Recent Labs  04/09/15 0254 04/10/15 0200  NA 140 139  K 4.6 3.7  CL 103 98*  CO2 30 32  GLUCOSE 158* 117*  BUN 5* 7  CREATININE 0.53 0.47  CALCIUM 8.6* 8.4*    PT/INR:  Recent Labs  04/07/15 1158  LABPROT 15.7*  INR 1.23   ABG    Component Value Date/Time   PHART 7.327* 04/08/2015 0400   HCO3 22.7 04/08/2015 0400   TCO2 24.1 04/08/2015 0400   ACIDBASEDEF 2.4* 04/08/2015 0400   O2SAT 96.6 04/08/2015 0400   CBG (last 3)   Recent Labs  04/09/15 1559 04/09/15 2212 04/10/15 0737  GLUCAP 132* 105* 105*    Assessment/Plan: S/P Procedure(s) (LRB): CORONARY ARTERY BYPASS GRAFTING (CABG), OFF PUMP, TIMES ONE, USING LEFT INTERNAL MAMMARY ARTERY  (N/A) TRANSESOPHAGEAL ECHOCARDIOGRAM (TEE) (N/A) Plan for transfer to telemetry unit Continue current care  LOS: 5 days    Tharon Aquas Trigt III 04/10/2015

## 2015-04-10 NOTE — Progress Notes (Signed)
Report called to 2w- given to RN.  Pt alertx4, on RA O2 95% VSS, IV saline locked.  No complaints of pain.  Family updated on tx to floor.

## 2015-04-11 ENCOUNTER — Inpatient Hospital Stay (HOSPITAL_COMMUNITY): Payer: Managed Care, Other (non HMO)

## 2015-04-11 LAB — BASIC METABOLIC PANEL
Anion gap: 7 (ref 5–15)
BUN: 7 mg/dL (ref 6–20)
CO2: 31 mmol/L (ref 22–32)
Calcium: 8.5 mg/dL — ABNORMAL LOW (ref 8.9–10.3)
Chloride: 101 mmol/L (ref 101–111)
Creatinine, Ser: 0.5 mg/dL (ref 0.44–1.00)
GFR calc Af Amer: >60 mL/min (ref >60)
GFR calc non Af Amer: >60 mL/min (ref >60)
Glucose, Bld: 145 mg/dL — ABNORMAL HIGH (ref 65–99)
Potassium: 3.6 mmol/L (ref 3.5–5.1)
Sodium: 139 mmol/L (ref 135–145)

## 2015-04-11 LAB — CBC
HCT: 36.4 % (ref 36.0–46.0)
Hemoglobin: 12.2 g/dL (ref 12.0–15.0)
MCH: 29.8 pg (ref 26.0–34.0)
MCHC: 33.5 g/dL (ref 30.0–36.0)
MCV: 89 fL (ref 78.0–100.0)
Platelets: 249 10*3/uL (ref 150–400)
RBC: 4.09 MIL/uL (ref 3.87–5.11)
RDW: 14.3 % (ref 11.5–15.5)
WBC: 7.2 10*3/uL (ref 4.0–10.5)

## 2015-04-11 LAB — GLUCOSE, CAPILLARY: Glucose-Capillary: 121 mg/dL — ABNORMAL HIGH (ref 65–99)

## 2015-04-11 MED ORDER — ASPIRIN 325 MG PO TBEC: 325.0000 mg | DELAYED_RELEASE_TABLET | Freq: Every day | ORAL | Status: DC

## 2015-04-11 MED ORDER — ATORVASTATIN CALCIUM 80 MG PO TABS: 80.0000 mg | ORAL_TABLET | Freq: Every day | ORAL | Status: DC

## 2015-04-11 MED ORDER — LISINOPRIL 5 MG PO TABS: 5.0000 mg | ORAL_TABLET | Freq: Every day | ORAL | Status: DC

## 2015-04-11 MED ORDER — OXYCODONE HCL 5 MG PO TABS: 5.0000 mg | ORAL_TABLET | ORAL | Status: DC | PRN

## 2015-04-11 MED ORDER — METOPROLOL TARTRATE 25 MG PO TABS: 25.0000 mg | ORAL_TABLET | Freq: Two times a day (BID) | ORAL | Status: DC

## 2015-04-11 MED ORDER — FUROSEMIDE 40 MG PO TABS: 40.0000 mg | ORAL_TABLET | Freq: Every day | ORAL | Status: DC

## 2015-04-11 MED ORDER — TRAMADOL HCL 50 MG PO TABS: 50.0000 mg | ORAL_TABLET | ORAL | Status: DC | PRN

## 2015-04-11 MED ORDER — POTASSIUM CHLORIDE CRYS ER 20 MEQ PO TBCR: 20.0000 meq | EXTENDED_RELEASE_TABLET | Freq: Every day | ORAL | Status: DC

## 2015-04-11 MED ORDER — LISINOPRIL 5 MG PO TABS
5.0000 mg | ORAL_TABLET | Freq: Every day | ORAL | Status: DC
  Administered 2015-04-11: 5 mg via ORAL
  Filled 2015-04-11: qty 1

## 2015-04-11 NOTE — Care Management Note (Signed)
Case Management Note Marvetta Gibbons RN, BSN Unit 2W-Case Manager [phone]  Patient Details  Name: Kaylee Bryant MRN: [id number] Date of Birth: March 13, 1966  Subjective/Objective:  Pt s/p CABG                  Action/Plan: PTA pt lived at home- anticipate return home  Expected Discharge Date:     04/11/15             Expected Discharge Plan:  Home/Self Care  In-House Referral:     Discharge planning Services  CM Consult  Post Acute Care Choice:    Choice offered to:     DME Arranged:  N/A DME Agency:  NA  HH Arranged:  NA HH Agency:  NA  Status of Service:  Completed, signed off  Medicare Important Message Given:    Date Medicare IM Given:    Medicare IM give by:    Date Additional Medicare IM Given:    Additional Medicare Important Message give by:     If discussed at [place] of Stay Meetings, dates discussed:    Additional Comments:  Dawayne Patricia, RN 04/11/2015, 9:56 AM

## 2015-04-11 NOTE — Progress Notes (Signed)
      [address]       [place],[address] [address]             [phone]      4 Days Post-Op Procedure(s) (LRB): CORONARY ARTERY BYPASS GRAFTING (CABG), OFF PUMP, TIMES ONE, USING LEFT INTERNAL MAMMARY ARTERY (N/A) TRANSESOPHAGEAL ECHOCARDIOGRAM (TEE) (N/A)   Subjective:  Ms. Delich has no complaints.  Was told she could go home today and she would like to do this.  She is ambulating.  + BM  Objective: Vital signs in last 24 hours: Temp:  [97.3 F (36.3 C)-98.4 F (36.9 C)] 97.3 F (36.3 C) (01/16 0429) Pulse Rate:  [76-95] 76 (01/16 0429) Cardiac Rhythm:  [-] Normal sinus rhythm (01/16 0741) Resp:  [14-20] 18 (01/16 0429) BP: (119-144)/(63-77) 134/63 mmHg (01/16 0429) SpO2:  [93 %-98 %] 94 % (01/16 0429) Weight:  [224 lb 3.2 oz (101.696 kg)] 224 lb 3.2 oz (101.696 kg) (01/16 0429)  Intake/Output from previous day: 01/15 0701 - 01/16 0700 In: 200 [P.O.:200] Out: 150 [Urine:150]  General appearance: alert, cooperative and no distress Heart: regular rate and rhythm Lungs: clear to auscultation bilaterally Abdomen: soft, non-tender; bowel sounds normal; no masses,  no organomegaly Extremities: edema trace Wound: clean and dry  Lab Results:  Recent Labs  04/10/15 0200 04/11/15 0537  WBC 10.1 7.2  HGB 11.6* 12.2  HCT 35.4* 36.4  PLT 206 249   BMET:  Recent Labs  04/10/15 0200 04/11/15 0537  NA 139 139  K 3.7 3.6  CL 98* 101  CO2 32 31  GLUCOSE 117* 145*  BUN 7 7  CREATININE 0.47 0.50  CALCIUM 8.4* 8.5*    PT/INR: No results for input(s): LABPROT, INR in the last 72 hours. ABG    Component Value Date/Time   PHART 7.327* 04/08/2015 0400   HCO3 22.7 04/08/2015 0400   TCO2 24.1 04/08/2015 0400   ACIDBASEDEF 2.4* 04/08/2015 0400   O2SAT 96.6 04/08/2015 0400   CBG (last 3)   Recent Labs  04/10/15 1621 04/10/15 2110 04/11/15 0616  GLUCAP 131* 95 121*    Assessment/Plan: S/P Procedure(s) (LRB): CORONARY ARTERY BYPASS GRAFTING  (CABG), OFF PUMP, TIMES ONE, USING LEFT INTERNAL MAMMARY ARTERY (N/A) TRANSESOPHAGEAL ECHOCARDIOGRAM (TEE) (N/A)  1. CV- hemodynamically stable, BP has ranges low 110s-140s- continue Lopressor, will start Lisinopril 2. Pulm- CXR shows atelectasis, small left pleural effusion, encouraged patient to continue IS at discharge 3. Renal- creatinine WNL, weight trending down, continue Lasix 4. DM- sugars controlled on Glucophage at home 5. Dispo- patient stable, will d/c EPW today... Wants to go home... Maybe later this afternoon if ok with staff   LOS: 6 days    Ellwood Handler 04/11/2015

## 2015-04-11 NOTE — Progress Notes (Signed)
      [address]       [place] [address]             [phone]        CARDIOTHORACIC SURGERY PROGRESS NOTE   R4 Days Post-Op Procedure(s) (LRB): CORONARY ARTERY BYPASS GRAFTING (CABG), OFF PUMP, TIMES ONE, USING LEFT INTERNAL MAMMARY ARTERY (N/A) TRANSESOPHAGEAL ECHOCARDIOGRAM (TEE) (N/A)  Subjective: Patient feels good. Eating/drinking well. Minimal pain. Feels like she is ready to go home.  Objective: Vital signs: BP Readings from Last 1 Encounters:  04/11/15 134/63   Pulse Readings from Last 1 Encounters:  04/11/15 76   Resp Readings from Last 1 Encounters:  04/11/15 18   Temp Readings from Last 1 Encounters:  04/11/15 97.3 F (36.3 C) Oral    Hemodynamics:  stable  Physical Exam:  Rhythm:   Regular rate and rhythem  Breath sounds: Clear to anterior auscultation  Heart sounds:  No murmur, rubs, gallops  Incisions:  Clean, dry, no erythema, no swelling  Abdomen:  Soft, non-tender, non-distended, no masses  Extremities:  Warm, no edema   Intake/Output from previous day: 01/15 0701 - 01/16 0700 In: 200 [P.O.:200] Out: 150 [Urine:150] Intake/Output this shift:    Lab Results:  CBC: Recent Labs  04/10/15 0200 04/11/15 0537  WBC 10.1 7.2  HGB 11.6* 12.2  HCT 35.4* 36.4  PLT 206 249    BMET:  Recent Labs  04/10/15 0200 04/11/15 0537  NA 139 139  K 3.7 3.6  CL 98* 101  CO2 32 31  GLUCOSE 117* 145*  BUN 7 7  CREATININE 0.47 0.50  CALCIUM 8.4* 8.5*     PT/INR:  No results for input(s): LABPROT, INR in the last 72 hours.  CBG (last 3)   Recent Labs  04/10/15 1621 04/10/15 2110 04/11/15 0616  GLUCAP 131* 95 121*    ABG    Component Value Date/Time   PHART 7.327* 04/08/2015 0400   PCO2ART 44.6 04/08/2015 0400   PO2ART 88.6 04/08/2015 0400   HCO3 22.7 04/08/2015 0400   TCO2 24.1 04/08/2015 0400   ACIDBASEDEF 2.4* 04/08/2015 0400   O2SAT 96.6 04/08/2015 0400    CXR: Stable cardiomegaly. Mild  atelectasis.  Assessment/Plan: S/P Procedure(s) (LRB): CORONARY ARTERY BYPASS GRAFTING (CABG), OFF PUMP, TIMES ONE, USING LEFT INTERNAL MAMMARY ARTERY (N/A) TRANSESOPHAGEAL ECHOCARDIOGRAM (TEE) (N/A)  Continue current care Plan for discharge today LOS: 6 days  Lawernce Keas, MD 04/11/2015 7:51 AM   I have seen and examined the patient and agree with the assessment and plan as outlined.  Doing well.  D/C home today.  Instructions given.  Rexene Alberts, MD 04/11/2015 8:23 AM

## 2015-04-11 NOTE — Discharge Summary (Signed)
Physician Discharge Summary  Patient ID: Kaylee Bryant MRN: [id number] DOB/AGE: 49-11-1965 49 y.o.  Admit date: 04/05/2015 Discharge date: 04/11/2015  Admission Diagnoses:  Patient Active Problem List   Diagnosis Date Noted  . Obesity   . Pain in the chest 04/05/2015  . Chest pain 04/05/2015  . 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 NSTEMI (non-ST elevated myocardial infarction) ([place])   . Irregular menses 01/25/2014  . S/P cholecystectomy 06/02/2013  . IBS (irritable bowel syndrome) 06/02/2013  . Oligomenorrhea 01/25/2013  . Atypical nevi 01/25/2013  . MS (multiple sclerosis) ([place]) 01/25/2013  . Abnormal Pap smear 01/25/2013  . Multiple sclerosis ([place]) 09/02/2012  . Left knee pain 09/02/2012  . Abnormality of gait 09/02/2012   Discharge Diagnoses:   Patient Active Problem List   Diagnosis Date Noted  . 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 Obesity   . Pain in the chest 04/05/2015  . Chest pain 04/05/2015  . 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 NSTEMI (non-ST elevated myocardial infarction) ([place])   . Irregular menses 01/25/2014  . S/P cholecystectomy 06/02/2013  . IBS (irritable bowel syndrome) 06/02/2013  . Oligomenorrhea 01/25/2013  . Atypical nevi 01/25/2013  . MS (multiple sclerosis) ([place]) 01/25/2013  . Abnormal Pap smear 01/25/2013  . Multiple sclerosis ([place]) 09/02/2012  . Left knee pain 09/02/2012  . Abnormality of gait 09/02/2012   Discharged Condition: good  History of Present Illness:  [name] is a 50 year old female with history of type 2 diabetes mellitus but no previous history of coronary artery disease was admitted to the hospital with a weeklong history of accelerating symptoms of exertional substernal chest pain culminating with several prolonged episodes of chest discomfort over this past weekend that developed with minimal  activity and were slow to resolve. On the morning of hospital admission the patient was awoken from her sleep with severe substernal chest pain associated with shortness of breath. She took an aspirin and symptoms subsided a little bit but persisted. EMS was called and sublingual nitroglycerin was administered on their arrival. The patient's symptoms of chest pain subsequently resolved. She was transported to the emergency department where initial EKG was notable for sinus rhythm without acute ST segment changes. Serial troponin levels were weakly positive and trended up to 0.3, consistent with acute coronary syndrome and mild acute non-ST segment elevation myocardial infarction. She has not had any recurrent chest pain since hospital admission. The patient underwent diagnostic cardiac catheterization by Dr. Claiborne Billings demonstrating 95% ostial stenosis of the left anterior descending coronary artery. Left ventricular systolic function remains preserved. Coronary anatomy is felt to be unfavorable for percutaneous coronary intervention. Cardiothoracic surgical consultation was requested.  She was evaluated by Dr. Roxy Manns who was in agreement the patient should undergo bypass surgery.  The risks and benefits of the procedure were explained to the patient and she was agreeable to proceed.  Hospital Course:   She was taken to the operating room on 04/07/2015.  She underwent an off pump CABG x 1 utilizing LIMA to LAD.  She tolerated the procedure without difficulty and was taken to the SICU in stable condition.  She was extubated the evening of surgery.  During her stay in the SICU the patient's chest tubes and arterial lines were removed without difficulty.  She was hypertensive and required use of Nitroglycerin drip, which was weaned as her blood pressure improved.  She was  maintaining NSR and was ambulating independently.  She was felt medically stable for transfer to the telemetry unit on POD #2.  The patient continued to  progress.  She has been started on a low dose ACE for hypertension.  She has been restarted on home diabetic medications with good control of blood sugars.  She is felt medically stable for discharge today.   Significant Diagnostic Studies: angiography:    Prox RCA lesion, 20% stenosed.  Mid RCA lesion, 30% stenosed.  Ost LAD lesion, 95% stenosed.  The left ventricular systolic function is normal.  Normal global ejection fraction at 55% with mild distal anterolateral hypocontractility.  Treatments: surgery:   Off-pump Coronary Artery Bypass Grafting x 1  Left Internal Mammary Artery to Distal Left Anterior Descending Coronary Artery  Disposition: 01-Home or Self Care   Discharge medications:  The patient has been discharged on:   1.Beta Blocker:  Yes [ x  ]                              No   [   ]                              If No, reason:  2.Ace Inhibitor/ARB: Yes [ x  ]                                     No  [    ]                                     If No, reason:  3.Statin:   Yes [ x  ]                  No  [   ]                  If No, reason:  4.Ecasa:  Yes  [x   ]                  No   [   ]                  If No, reason:     Medication List    STOP taking these medications        ibuprofen 200 MG tablet  Commonly known as:  ADVIL,MOTRIN      TAKE these medications        aspirin 325 MG EC tablet  Take 1 tablet (325 mg total) by mouth daily.     atorvastatin 80 MG tablet  Commonly known as:  LIPITOR  Take 1 tablet (80 mg total) by mouth daily at 6 PM.     baclofen 10 MG tablet  Commonly known as:  LIORESAL  TAKE ONE TABLET BY MOUTH THREE TIMES DAILY     gabapentin 300 MG capsule  Commonly known as:  NEURONTIN  TAKE ONE CAPSULE BY MOUTH THREE TIMES DAILY     lisinopril 5 MG tablet  Commonly known as:  PRINIVIL,ZESTRIL  Take 1 tablet (5 mg total) by mouth daily.     metFORMIN 500 MG 24 hr tablet  Commonly known as:   GLUCOPHAGE-XR  Take  500 mg by mouth every evening.     methylphenidate 10 MG tablet  Commonly known as:  RITALIN  Take 1 tablet (10 mg total) by mouth 3 (three) times daily with meals. Do not take last dose any later than 4pm     metoprolol tartrate 25 MG tablet  Commonly known as:  LOPRESSOR  Take 1 tablet (25 mg total) by mouth 2 (two) times daily.     natalizumab 300 MG/15ML injection  Commonly known as:  TYSABRI  Inject 15 mLs (300 mg total) into the vein every 30 (thirty) days.     oxyCODONE 5 MG immediate release tablet  Commonly known as:  Oxy IR/ROXICODONE  Take 1 tablet (5 mg total) by mouth every 3 (three) hours as needed for severe pain.     traMADol 50 MG tablet  Commonly known as:  ULTRAM  Take 1-2 tablets (50-100 mg total) by mouth every 4 (four) hours as needed for moderate pain.     traZODone 50 MG tablet  Commonly known as:  DESYREL  Take 50 mg by mouth at bedtime.       Follow-up Information    Follow up with Rexene Alberts, MD In 4 weeks.   Specialty:  Cardiothoracic Surgery   Why:  Office will contact you with appointment   Contact information:   [address] [place] [address] [phone]       Follow up with [facility] In 4 weeks.   Why:  please get CXR 30 min prior to your appointment with Dr. Leota Sauers information:   [facility]       Signed: Ellwood Handler 04/11/2015, 8:51 AM

## 2015-04-11 NOTE — Progress Notes (Signed)
CARDIAC REHAB PHASE I   PRE:  Rate/Rhythm: 78 SR  BP:  Supine:   Sitting: 120/80  Standing:    SaO2: 98%RA  MODE:  Ambulation: 290 ft   POST:  Rate/Rhythm: 86  BP:  Supine:   Sitting: 134/63  Standing:    SaO2: 94%RA 0905-0951 Pt walked 290 ft with hand held asst and she held to side rail occasionally. Gait fairly steady. Has husband and daughter who will be home this week to assist with walking. Education completed with pt who voiced understanding. Encouraged IS. Discussed CRP 2 and referring to [facility] program. Wrote down how to view discharge video. Pt newly diagnosed as diabetic. Reviewed carb counting and heart healthy diets. Pt knows she needs to lose weight also and had joined weight watchers.    Graylon Good, RN BSN  04/11/2015 9:45 AM

## 2015-04-11 NOTE — Discharge Instructions (Signed)
Coronary Artery Bypass Grafting, Care After °Refer to this sheet in the next few weeks. These instructions provide you with information on caring for yourself after your procedure. Your health care provider may also give you more specific instructions. Your treatment has been planned according to current medical practices, but problems sometimes occur. Call your health care provider if you have any problems or questions after your procedure. °WHAT TO EXPECT AFTER THE PROCEDURE °Recovery from surgery will be different for everyone. Some people feel well after 3 or 4 weeks, while for others it takes longer. After your procedure, it is typical to have the following: °· Nausea and a lack of appetite.   °· Constipation. °· Weakness and fatigue.   °· Depression or irritability.   °· Pain or discomfort at your incision site. °HOME CARE INSTRUCTIONS °· Take medicines only as directed by your health care provider. Do not stop taking medicines or start any new medicines without first checking with your health care provider. °· Take your pulse as directed by your health care provider. °· Perform deep breathing as directed by your health care provider. If you were given a device called an incentive spirometer, use it to practice deep breathing several times a day. Support your chest with a pillow or your arms when you take deep breaths or cough. °· Keep incision areas clean, dry, and protected. Remove or change any bandages (dressings) only as directed by your health care provider. You may have skin adhesive strips over the incision areas. Do not take the strips off. They will fall off on their own. °· Check incision areas daily for any swelling, redness, or drainage. °· If incisions were made in your legs, do the following: °¨ Avoid crossing your legs.   °¨ Avoid sitting for long periods of time. Change positions every 30 minutes.   °¨ Elevate your legs when you are sitting. °· Wear compression stockings as directed by your  health care provider. These stockings help keep blood clots from forming in your legs. °· Take showers once your health care provider approves. Until then, only take sponge baths. Pat incisions dry. Do not rub incisions with a washcloth or towel. Do not take baths, swim, or use a hot tub until your health care provider approves. °· Eat foods that are high in fiber, such as raw fruits and vegetables, whole grains, beans, and nuts. Meats should be lean cut. Avoid canned, processed, and fried foods. °· Drink enough fluid to keep your urine clear or pale yellow. °· Weigh yourself every day. This helps identify if you are retaining fluid that may make your heart and lungs work harder. °· Rest and limit activity as directed by your health care provider. You may be instructed to: °¨ Stop any activity at once if you have chest pain, shortness of breath, irregular heartbeats, or dizziness. Get help right away if you have any of these symptoms. °¨ Move around frequently for short periods or take short walks as directed by your health care provider. Increase your activities gradually. You may need physical therapy or cardiac rehabilitation to help strengthen your muscles and build your endurance. °¨ Avoid lifting, pushing, or pulling anything heavier than 10 lb (4.5 kg) for at least 6 weeks after surgery. °· Do not drive until your health care provider approves.  °· Ask your health care provider when you may return to work. °· Ask your health care provider when you may resume sexual activity. °· Keep all follow-up visits as directed by your health care   provider. This is important. °SEEK MEDICAL CARE IF: °· You have swelling, redness, increasing pain, or drainage at the site of an incision. °· You have a fever. °· You have swelling in your ankles or legs. °· You have pain in your legs.   °· You gain 2 or more pounds (0.9 kg) a day. °· You are nauseous or vomit. °· You have diarrhea.  °SEEK IMMEDIATE MEDICAL CARE IF: °· You have  chest pain that goes to your jaw or arms. °· You have shortness of breath.   °· You have a fast or irregular heartbeat.   °· You notice a "clicking" in your breastbone (sternum) when you move.   °· You have numbness or weakness in your arms or legs. °· You feel dizzy or light-headed.   °MAKE SURE YOU: °· Understand these instructions. °· Will watch your condition. °· Will get help right away if you are not doing well or get worse. °  °This information is not intended to replace advice given to you by your health care provider. Make sure you discuss any questions you have with your health care provider. °  °Document Released: 09/29/2004 Document Revised: 04/02/2014 Document Reviewed: 08/19/2012 °Elsevier Interactive Patient Education ©2016 [employer] ° °

## 2015-04-27 ENCOUNTER — Ambulatory Visit (INDEPENDENT_AMBULATORY_CARE_PROVIDER_SITE_OTHER): Payer: Managed Care, Other (non HMO) | Admitting: Physician Assistant

## 2015-04-27 ENCOUNTER — Encounter: Payer: Self-pay | Admitting: Physician Assistant

## 2015-04-27 VITALS — BP 110/78 | HR 64 | Ht 63.0 in | Wt 217.5 lb

## 2015-04-27 DIAGNOSIS — E785 Hyperlipidemia, unspecified: Secondary | ICD-10-CM | POA: Diagnosis not present

## 2015-04-27 DIAGNOSIS — E1169 Type 2 diabetes mellitus with other specified complication: Secondary | ICD-10-CM | POA: Diagnosis not present

## 2015-04-27 DIAGNOSIS — R5383 Other fatigue: Secondary | ICD-10-CM

## 2015-04-27 DIAGNOSIS — Z951 Presence of aortocoronary bypass graft: Secondary | ICD-10-CM | POA: Diagnosis not present

## 2015-04-27 NOTE — Patient Instructions (Addendum)
Medication Instructions:  None  Labwork: None  Testing/Procedures: None  Follow-Up: Your physician recommends that you schedule a follow-up appointment in: 3 months with Dr. Gwenlyn Found.   Any Other Special Instructions Will Be Listed Below (If Applicable).     If you need a refill on your cardiac medications before your next appointment, please call your pharmacy.

## 2015-04-27 NOTE — Progress Notes (Signed)
Cardiology Office Note   Date:  04/27/2015   ID:  Kaylee Bryant, DOB 08-Jun-1965, MRN [id number]  PCP:  Jonathon Bellows, MD  Cardiologist: Dr Stacy Gardner, PA-C   No chief complaint on file.   History of Present Illness: Kaylee Bryant is a 50 y.o. female with a history of DM, IBD, DDD of lumbar, former smoker (quit 2005) and multiple sclerosis.  Admit 01/10-01/16 w/ NSTEMI, s/p CABG w/ LIMA-LAD  Kaylee Bryant presents for post hospital follow-up.  Since discharge from the hospital, she has done fairly well. She has had some problems with fatigue and not feeling like doing much. However, she is working to increase her activity and feels that she is doing fairly well with this.  She has not had problems with lower extremity edema, orthopnea, or PND. She is trying to increase her activity gradually and being compliant with her medications. The only thing she struggles with, is fatigue. She feels she has almost no stamina and has trouble doing things she used to be able to do. She will get halfway through something, get tired and then have to sit down and rest. Once she rests, she is able to get up and do more. She is not had chest pain or palpitations. She has not had presyncope.  She wonders if some of her symptoms are from her diabetes. She also wonders if all the new medicines she is on are contributing to her symptoms.   Past Medical History  Diagnosis Date  . Multiple sclerosis ([place])   . Fatigue   . Abnormal Pap smear 2004    HPV, cone biopsy  . DM (diabetes mellitus), gestational 2006    insulin at the time  . IBS (irritable bowel syndrome)     with chronic constipation  . DDD (degenerative disc disease), lumbar   . Coronary artery disease involving native coronary artery with unstable angina pectoris ([place]) 04/05/2015  . Obesity   . 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 Cholecystectomy      1988    . Cesarean section      1996, 2006  . Colposcopy  2004  . Cervical cone biopsy  2004  . Cardiac catheterization N/A 04/05/2015    Procedure: Left Heart Cath and Coronary Angiography;  Surgeon: Troy Sine, MD;  Location: [facility];  Service: Cardiovascular;  Laterality: N/A;  . Coronary artery bypass graft N/A 04/07/2015    Procedure: CORONARY ARTERY BYPASS GRAFTING (CABG), OFF PUMP, TIMES ONE, USING LEFT INTERNAL MAMMARY ARTERY;  Surgeon: Rexene Alberts, MD;  Location: [facility];  Service: Open Heart Surgery;  Laterality: N/A;  LIMA to LAD  . Tee without cardioversion N/A 04/07/2015    Procedure: TRANSESOPHAGEAL ECHOCARDIOGRAM (TEE);  Surgeon: Rexene Alberts, MD;  Location: [facility];  Service: Open Heart Surgery;  Laterality: N/A;    Current Outpatient Prescriptions  Medication Sig Dispense Refill  . aspirin EC 325 MG EC tablet Take 1 tablet (325 mg total) by mouth daily. 30 tablet 0  . atorvastatin (LIPITOR) 80 MG tablet Take 1 tablet (80 mg total) by mouth daily at 6 PM. 30 tablet 3  . baclofen (LIORESAL) 10 MG tablet TAKE ONE TABLET BY MOUTH THREE TIMES DAILY 90 tablet 5  . gabapentin (NEURONTIN) 300 MG capsule TAKE ONE CAPSULE BY MOUTH THREE TIMES DAILY 90  capsule 6  . lisinopril (PRINIVIL,ZESTRIL) 5 MG tablet Take 1 tablet (5 mg total) by mouth daily. 30 tablet 3  . metFORMIN (GLUCOPHAGE-XR) 500 MG 24 hr tablet Take 500 mg by mouth every evening.    . methylphenidate (RITALIN) 10 MG tablet Take 1 tablet (10 mg total) by mouth 3 (three) times daily with meals. Do not take last dose any later than 4pm 90 tablet 0  . metoprolol tartrate (LOPRESSOR) 25 MG tablet Take 1 tablet (25 mg total) by mouth 2 (two) times daily. 60 tablet 3  . natalizumab (TYSABRI) 300 MG/15ML injection Inject 15 mLs (300 mg total) into the vein every 30 (thirty) days. 15 mL 11  . oxyCODONE (OXY IR/ROXICODONE) 5 MG immediate release tablet Take 1 tablet (5 mg total) by mouth every 3 (three) hours as needed for  severe pain. 30 tablet 0  . traMADol (ULTRAM) 50 MG tablet Take 1-2 tablets (50-100 mg total) by mouth every 4 (four) hours as needed for moderate pain. 30 tablet 0  . traZODone (DESYREL) 50 MG tablet Take 50 mg by mouth at bedtime.     No current facility-administered medications for this visit.    Allergies:   Review of patient's allergies indicates no known allergies.    Social History:  The patient  reports that she quit smoking about 11 years ago. Her smoking use included Cigarettes. She has a 22.5 pack-year smoking history. She has never used smokeless tobacco. She reports that she drinks about 0.6 oz of alcohol per week. She reports that she does not use illicit drugs.   Family History:  The patient's family history includes Arrhythmia in her sister; Atrial fibrillation in her father; Breast cancer in her maternal aunt; Diabetes in her mother and sister; Heart attack in her maternal grandfather; Hypertension in her father and mother. There is no history of Stroke.    ROS:  Please see the history of present illness. All other systems are reviewed and negative.    PHYSICAL EXAM: VS:  BP 110/78 mmHg  Pulse 64  Ht 5\' 3"  (1.6 m)  Wt 217 lb 8 oz (98.657 kg)  BMI 38.54 kg/m2  LMP 10/03/2014 , BMI Body mass index is 38.54 kg/(m^2). GEN: Well nourished, well developed, female in no acute distress HEENT: normal for age  Neck: no JVD, no carotid bruit, no masses Cardiac: RRR; no murmur, no rubs, or gallops Respiratory:  clear to auscultation bilaterally, normal work of breathing GI: soft, nontender, nondistended, + BS MS: no deformity or atrophy; no edema; distal pulses are 2+ in all 4 extremities  Skin: warm and dry, no rash; all incisions are healing well, one of her chest tube incisions is open a bit, but there is no drainage and no sign of infection. Neuro:  Strength and sensation are intact Psych: euthymic mood, full affect   EKG:  EKG is ordered today. The ekg ordered today  demonstrates sinus rhythm, no acute ischemic changes  Cardiac Cath: 04/05/2015 Conclusion     Prox RCA lesion, 20% stenosed.  Mid RCA lesion, 30% stenosed.  Ost LAD lesion, 95% stenosed.  The left ventricular systolic function is normal.  Normal global ejection fraction at 55% with mild distal anterolateral hypocontractility.  High-grade 95% ostial LAD stenosis arising immediately after trifurcation from the left main, ramus intermediate and left circumflex vessel. Mild nonobstructive RCA disease with 20% proximal stenosis followed by diffuse mid 30% luminal irregularity.  RECOMMENDATION: The angiographic findings were reviewed with the patient in  detail. Options were discussed with the patient including a left internal mammary artery bypass graft to her LAD versus PCI/stenting to her ostial LAD. Angiograms were reviewed with Drs. Cooper and [employer]. We all feel that there is not a good landing zone to land the ostial portion of a stent due to the large ramus intermediate vessel. With the patient's recent development of diabetes mellitus, it is felt that the best long-term option is a LIMA graft placed to her LAD. Surgical consultation was requested. The patient will be transferred to the CCU/TCU and will be started on IV nitroglycerin with plans for heparinization and beta blocker therapy.    Recent Labs: 04/05/2015: TSH 1.684 04/06/2015: ALT 75* 04/08/2015: Magnesium 2.2 04/11/2015: BUN 7; Creatinine, Ser 0.50; Hemoglobin 12.2; Platelets 249; Potassium 3.6; Sodium 139    Lipid Panel    Component Value Date/Time   CHOL 159 04/06/2015 0500   TRIG 94 04/06/2015 0500   HDL 28* 04/06/2015 0500   CHOLHDL 5.7 04/06/2015 0500   VLDL 19 04/06/2015 0500   LDLCALC 112* 04/06/2015 0500     Wt Readings from Last 3 Encounters:  04/27/15 217 lb 8 oz (98.657 kg)  04/11/15 224 lb 3.2 oz (101.696 kg)  01/18/15 235 lb 12.8 oz (106.958 kg)     Other studies Reviewed: Additional  studies/ records that were reviewed today include: Hospital records and testing.  ASSESSMENT AND PLAN:  1.  CAD, status post CABG 1 with LIMA to LAD: The patient is doing well postoperatively. She is on full-strength aspirin, Lipitor, an ACE inhibitor and a beta blocker.  2. Fatigue: It is possible that the beta blockers contributing to the fatigue. Her heart rate today is in the 50s. I advised that her heart rate may be contributing to her fatigue if it goes lower than 50 at times. I discussed decreasing her beta blocker versus allowing her more recovery time from the surgery and then deciding if a reduction in her beta blocker dose as needed. She still wonders if her blood sugar is contributing to her symptoms and so wishes to hold off on medication changes for now. She is to continue to monitor her symptoms and if they do not improve as her recovery proceeds, she is to contact us and we can make medication changes at that time.  3. Hyperlipidemia: She is on high-dose statin and is to continue this for now. Repeat lipid profile and liver function testing per primary cardiologist   Current medicines are reviewed at length with the patient today.  The patient does not have concerns regarding medicines.  The following changes have been made:  no change  Labs/ tests ordered today include:   Orders Placed This Encounter  Procedures  . EKG 12-Lead   Disposition:   FU with Dr Gwenlyn Found  Signed, Lenoard Aden  04/27/2015 2:08 PM    [facility] [address], [place], [place]  [address] Phone: [phone]; Fax: [phone]

## 2015-04-29 DIAGNOSIS — Z0289 Encounter for other administrative examinations: Secondary | ICD-10-CM

## 2015-05-03 ENCOUNTER — Encounter: Payer: Self-pay | Admitting: *Deleted

## 2015-05-03 ENCOUNTER — Telehealth: Payer: Self-pay | Admitting: *Deleted

## 2015-05-03 NOTE — Telephone Encounter (Signed)
Patient Physician Statement [employer] form on [place] desk.

## 2015-05-05 ENCOUNTER — Telehealth: Payer: Self-pay | Admitting: Nurse Practitioner

## 2015-05-05 NOTE — Telephone Encounter (Signed)
Telephone call to patient left message I'm filling out her jury form.  In one of the questions is to please explain how you can be employed but not serve on a jury. I need additional information about her job. Please return call

## 2015-05-06 ENCOUNTER — Other Ambulatory Visit: Payer: Self-pay | Admitting: Thoracic Surgery (Cardiothoracic Vascular Surgery)

## 2015-05-06 DIAGNOSIS — Z951 Presence of aortocoronary bypass graft: Secondary | ICD-10-CM

## 2015-05-06 NOTE — Telephone Encounter (Signed)
Form completed and signed

## 2015-05-06 NOTE — Telephone Encounter (Signed)
Spoke to pt.  She stated that she cannot sit for long periods of time due to muscle spasms from MS.   These have increased since her heart surgery 04-07-15, had heart attack 04-05-15.  Has FMLA until 07-04-15. She is not taking tysabri at this time due to surgery.  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    The form is for grand jury.  No date was given.

## 2015-05-09 ENCOUNTER — Ambulatory Visit (INDEPENDENT_AMBULATORY_CARE_PROVIDER_SITE_OTHER): Payer: Self-pay | Admitting: Thoracic Surgery (Cardiothoracic Vascular Surgery)

## 2015-05-09 ENCOUNTER — Encounter: Payer: Self-pay | Admitting: Thoracic Surgery (Cardiothoracic Vascular Surgery)

## 2015-05-09 ENCOUNTER — Telehealth: Payer: Self-pay | Admitting: *Deleted

## 2015-05-09 ENCOUNTER — Ambulatory Visit
Admission: RE | Admit: 2015-05-09 | Discharge: 2015-05-09 | Disposition: A | Discharge status: Home / Self Care | Payer: Managed Care, Other (non HMO) | Source: Ambulatory Visit | Attending: Thoracic Surgery (Cardiothoracic Vascular Surgery) | Admitting: Thoracic Surgery (Cardiothoracic Vascular Surgery)

## 2015-05-09 VITALS — BP 97/65 | HR 52 | Resp 16 | Ht 63.0 in | Wt 215.0 lb

## 2015-05-09 DIAGNOSIS — I249 Acute ischemic heart disease, unspecified: Secondary | ICD-10-CM

## 2015-05-09 DIAGNOSIS — Z951 Presence of aortocoronary bypass graft: Secondary | ICD-10-CM

## 2015-05-09 DIAGNOSIS — I2510 Atherosclerotic heart disease of native coronary artery without angina pectoris: Secondary | ICD-10-CM

## 2015-05-09 DIAGNOSIS — I214 Non-ST elevation (NSTEMI) myocardial infarction: Secondary | ICD-10-CM

## 2015-05-09 NOTE — Progress Notes (Signed)
[address]       [place] [address]             [phone]     CARDIOTHORACIC SURGERY OFFICE NOTE  Referring Provider is Troy Sine, MD  Primary Cardiologist is BERRY, Pearletha Forge, MD PCP is Jonathon Bellows, MD  HPI:  Patient is a 50 year old obese white female with history of type 2 diabetes mellitus who returns to the office today for routine follow-up status post off pump coronary artery bypass grafting 1 on 04/07/2015 for severe single-vessel coronary artery disease status post acute non-ST segment elevation myocardial infarction.  The patient's postoperative recovery was uneventful and she was discharged from the hospital on the fourth postoperative day.  Since hospital discharge the patient has been seen in follow-up at Dr. Kennon Holter office by Rosaria Ferries on 04/27/2015 and she returns to our office for routine follow-up today. The patient states that she is doing very well and she is pleased with her progress. She has mild residual soreness across her chest. She is no longer taking any sort of oral narcotic pain relievers. She denies any shortness of breath. Appetite is improving. Energy level is improving. Overall she has no complaints.   Current Outpatient Prescriptions  Medication Sig Dispense Refill  . aspirin EC 325 MG EC tablet Take 1 tablet (325 mg total) by mouth daily. 30 tablet 0  . atorvastatin (LIPITOR) 80 MG tablet Take 1 tablet (80 mg total) by mouth daily at 6 PM. 30 tablet 3  . baclofen (LIORESAL) 10 MG tablet TAKE ONE TABLET BY MOUTH THREE TIMES DAILY 90 tablet 5  . gabapentin (NEURONTIN) 300 MG capsule TAKE ONE CAPSULE BY MOUTH THREE TIMES DAILY 90 capsule 6  . lisinopril (PRINIVIL,ZESTRIL) 5 MG tablet Take 1 tablet (5 mg total) by mouth daily. 30 tablet 3  . metFORMIN (GLUCOPHAGE-XR) 500 MG 24 hr tablet Take 500 mg by mouth every evening.    . metoprolol tartrate (LOPRESSOR) 25 MG tablet Take 1 tablet (25 mg total) by mouth 2 (two)  times daily. 60 tablet 3  . traMADol (ULTRAM) 50 MG tablet Take 1-2 tablets (50-100 mg total) by mouth every 4 (four) hours as needed for moderate pain. 30 tablet 0  . traZODone (DESYREL) 50 MG tablet Take 50 mg by mouth at bedtime.    . natalizumab (TYSABRI) 300 MG/15ML injection Inject 15 mLs (300 mg total) into the vein every 30 (thirty) days. (Patient not taking: Reported on 04/27/2015) 15 mL 11   No current facility-administered medications for this visit.      Physical Exam:   BP 97/65 mmHg  Pulse 52  Resp 16  Ht 5\' 3"  (1.6 m)  Wt 215 lb (97.523 kg)  BMI 38.09 kg/m2  SpO2 97%  General:  Well-appearing  Chest:   Clear to auscultation  CV:   Regular rate and rhythm without murmur  Incisions:  Healing nicely, sternum is stable  Abdomen:  Soft and nontender  Extremities:  Warm and well-perfused  Diagnostic Tests:  CHEST 2 VIEW  COMPARISON: PA and lateral chest x-ray of April 11, 2015.  FINDINGS: The lungs are adequately inflated and clear. Bibasilar atelectasis has resolved. There is no pleural effusion or pneumothorax. The heart is top-normal in size. The pulmonary vascularity is normal. The sternal wires are intact.  IMPRESSION: There is no active cardiopulmonary disease.   Electronically Signed  By: David Martinique M.D.  On: 05/09/2015 14:36   Impression:  Patient is doing very well approximately one month status post single vessel off-pump coronary artery bypass grafting   Plan:  I have encouraged the patient to continue to gradually increase her activity as tolerated although she has been reminded that she must refrain from any sort of lifting or strenuous use of her arms or shoulders for at least another 2 months. Because her job requires significant lifting she may not be able to return to work until sometime in April. I've encouraged her to go ahead and enroll and participate in the outpatient cardiac rehabilitation program. I think she may resume  driving an automobile. We have not recommended any changes to her current medications. All of her questions have been addressed. The patient will return for routine follow-up next January, approximately 1 year following her original surgery. In the meanwhile she will continue to follow-up with Dr. Gwenlyn Found and she will call and return to see [place] only should specific problems or difficulties arise.    Valentina Gu. Roxy Manns, MD 05/09/2015 3:48 PM

## 2015-05-09 NOTE — Patient Instructions (Signed)
Continue to avoid any heavy lifting or strenuous use of your arms or shoulders for at least a total of three months from the time of surgery.  After three months you may gradually increase how much you lift or otherwise use your arms or chest as tolerated, with limits based upon whether or not activities lead to the return of significant discomfort.  You may return to driving an automobile as long as you are no longer requiring oral narcotic pain relievers during the daytime.  It would be wise to start driving only short distances during the daylight and gradually increase from there as you feel comfortable.  You are encouraged to enroll and participate in the outpatient cardiac rehab program beginning as soon as practical.  Continue all previous medications without any changes at this time  

## 2015-05-09 NOTE — Telephone Encounter (Signed)
I called patient left message that her form was ready for pick up.

## 2015-05-10 ENCOUNTER — Encounter (HOSPITAL_COMMUNITY)
Admission: RE | Admit: 2015-05-10 | Discharge: 2015-05-10 | Disposition: A | Discharge status: Home / Self Care | Payer: Managed Care, Other (non HMO) | Source: Ambulatory Visit | Attending: Cardiovascular Disease | Admitting: Cardiovascular Disease

## 2015-05-10 ENCOUNTER — Encounter (HOSPITAL_COMMUNITY): Payer: Self-pay

## 2015-05-10 ENCOUNTER — Telehealth: Payer: Self-pay | Admitting: Cardiovascular Disease

## 2015-05-10 VITALS — BP 100/68 | HR 56 | Ht 63.0 in | Wt 219.3 lb

## 2015-05-10 DIAGNOSIS — Z951 Presence of aortocoronary bypass graft: Secondary | ICD-10-CM | POA: Diagnosis not present

## 2015-05-10 DIAGNOSIS — I214 Non-ST elevation (NSTEMI) myocardial infarction: Secondary | ICD-10-CM | POA: Diagnosis not present

## 2015-05-10 NOTE — Telephone Encounter (Signed)
New message      Calling to check the status on a form sent to Dr Gwenlyn Found to get an order for cardiac rehab.  Please call

## 2015-05-10 NOTE — Telephone Encounter (Signed)
New message    Cardiac Rehab calling - the fax did not come through could it be re-faxed back over.

## 2015-05-10 NOTE — Telephone Encounter (Signed)
Spoke with Kaylee Bryant, aware rehab orders faxed today

## 2015-05-10 NOTE — Progress Notes (Signed)
Patient arrived for 1st visit/orientation/education at 8:00 am. Patient was referred to CR by Dr. Gwenlyn Found due to NSTEMI I21.4 and CABGx1 Z95.1. During orientation advised patient on arrival and appointment times what to wear, what to do before, during and after exercise. Reviewed attendance and class policy. Talked about inclement weather and class consultation policy. Pt is scheduled to return Cardiac Rehab on 05/16/15 at 9:30 am. Pt was advised to come to class 15 minutes before class starts. He was also given instructions on meeting with the dietician and attending the Family Structure classes. Pt is eager to get started. Patient was able to complete 6 minute walk test. Patient was measured for the equipment. Discussed equipment safety with patient. Took patient pre-anthropometric measurements. Patient scired 0 on PHQ-2 and 5 on PHQ-9. Patients states she is not depressed and does not need counseling at this time. She feels she just needs to get moving. Patient finished visit at 10:30am.

## 2015-05-10 NOTE — Progress Notes (Signed)
   Cardiac/Pulmonary Rehab Medication Review by a Pharmacist  Does the patient  feel that his/her medications are working for him/her?  yes  Has the patient been experiencing any side effects to the medications prescribed?  no  Does the patient measure his/her own blood pressure or blood glucose at home?  yes   Does the patient have any problems obtaining medications due to transportation or finances?   yes  Understanding of regimen: excellent Understanding of indications: excellent Potential of compliance: excellent  Pharmacist comments: Good understanding of medications.  Good compliance noted.  No questions.  Pricilla Larsson 05/10/2015 8:35 AM

## 2015-05-10 NOTE — Telephone Encounter (Signed)
Orders refaxed.

## 2015-05-10 NOTE — Patient Instructions (Signed)
Pt has finished orientation and is scheduled to start CR on 05/16/15 at 9:30 am. Pt has been instructed to arrive to class 15 minutes early for scheduled class. Pt has been instructed to wear comfortable clothing and shoes with rubber soles. Pt has been told to take their medications 1 hour prior to coming to class.  If the patient is not going to attend class, he/she has been instructed to call.

## 2015-05-16 ENCOUNTER — Encounter (HOSPITAL_COMMUNITY): Payer: Managed Care, Other (non HMO)

## 2015-05-18 ENCOUNTER — Encounter (HOSPITAL_COMMUNITY): Payer: Managed Care, Other (non HMO)

## 2015-05-20 ENCOUNTER — Encounter (HOSPITAL_COMMUNITY): Payer: Managed Care, Other (non HMO)

## 2015-05-23 ENCOUNTER — Encounter (HOSPITAL_COMMUNITY): Payer: Managed Care, Other (non HMO)

## 2015-05-25 ENCOUNTER — Encounter (HOSPITAL_COMMUNITY): Payer: Managed Care, Other (non HMO)

## 2015-05-26 ENCOUNTER — Telehealth: Payer: Self-pay | Admitting: Neurology

## 2015-05-26 NOTE — Telephone Encounter (Signed)
LMOM for Kaylee Bryant that [facility] did not receive a Tysabri infusion in January.  She infused in December and February, but not January/fim

## 2015-05-26 NOTE — Telephone Encounter (Signed)
Moji with [employer] [phone] ext [phone] called inquiring gif pt was infused in January 2017.

## 2015-05-27 ENCOUNTER — Encounter (HOSPITAL_COMMUNITY): Payer: Managed Care, Other (non HMO)

## 2015-05-30 ENCOUNTER — Encounter (HOSPITAL_COMMUNITY): Payer: Managed Care, Other (non HMO)

## 2015-05-30 NOTE — Progress Notes (Signed)
Patient is discharged from [facility] Pul[facility] program today, 05/20/15.  Patient only attended orientation. Could not start the program due to finances per patient.

## 2015-06-01 ENCOUNTER — Encounter (HOSPITAL_COMMUNITY): Payer: Managed Care, Other (non HMO)

## 2015-06-03 ENCOUNTER — Encounter (HOSPITAL_COMMUNITY): Payer: Managed Care, Other (non HMO)

## 2015-06-06 ENCOUNTER — Encounter (HOSPITAL_COMMUNITY): Payer: Managed Care, Other (non HMO)

## 2015-06-08 ENCOUNTER — Encounter (HOSPITAL_COMMUNITY): Payer: Managed Care, Other (non HMO)

## 2015-06-10 ENCOUNTER — Encounter (HOSPITAL_COMMUNITY): Payer: Managed Care, Other (non HMO)

## 2015-06-13 ENCOUNTER — Encounter (HOSPITAL_COMMUNITY): Payer: Managed Care, Other (non HMO)

## 2015-06-15 ENCOUNTER — Encounter (HOSPITAL_COMMUNITY): Payer: Managed Care, Other (non HMO)

## 2015-06-17 ENCOUNTER — Encounter (HOSPITAL_COMMUNITY): Payer: Managed Care, Other (non HMO)

## 2015-06-20 ENCOUNTER — Encounter (HOSPITAL_COMMUNITY): Payer: Managed Care, Other (non HMO)

## 2015-06-22 ENCOUNTER — Encounter (HOSPITAL_COMMUNITY): Payer: Managed Care, Other (non HMO)

## 2015-06-24 ENCOUNTER — Encounter (HOSPITAL_COMMUNITY): Payer: Managed Care, Other (non HMO)

## 2015-06-27 ENCOUNTER — Encounter (HOSPITAL_COMMUNITY): Payer: Managed Care, Other (non HMO)

## 2015-06-29 ENCOUNTER — Encounter (HOSPITAL_COMMUNITY): Payer: Managed Care, Other (non HMO)

## 2015-06-29 ENCOUNTER — Telehealth: Payer: Self-pay | Admitting: Neurology

## 2015-06-29 NOTE — Telephone Encounter (Signed)
[facility] [phone] option 1 ext [id number] called to check status of Tysabri re-authorization form that was faxed to our office 06/20/15 and 06/13/15. Expires 07/06/15.

## 2015-06-29 NOTE — Telephone Encounter (Signed)
Form received - given to Otila Kluver in [place] for renewal of PA.

## 2015-07-01 ENCOUNTER — Encounter (HOSPITAL_COMMUNITY): Payer: Managed Care, Other (non HMO)

## 2015-07-04 ENCOUNTER — Encounter (HOSPITAL_COMMUNITY): Payer: Managed Care, Other (non HMO)

## 2015-07-06 ENCOUNTER — Encounter (HOSPITAL_COMMUNITY): Payer: Managed Care, Other (non HMO)

## 2015-07-08 ENCOUNTER — Encounter (HOSPITAL_COMMUNITY): Payer: Managed Care, Other (non HMO)

## 2015-07-11 ENCOUNTER — Encounter (HOSPITAL_COMMUNITY): Payer: Managed Care, Other (non HMO)

## 2015-07-13 ENCOUNTER — Encounter (HOSPITAL_COMMUNITY): Payer: Managed Care, Other (non HMO)

## 2015-07-15 ENCOUNTER — Encounter (HOSPITAL_COMMUNITY): Payer: Managed Care, Other (non HMO)

## 2015-07-18 ENCOUNTER — Encounter (HOSPITAL_COMMUNITY): Payer: Managed Care, Other (non HMO)

## 2015-07-20 ENCOUNTER — Ambulatory Visit: Payer: Managed Care, Other (non HMO) | Admitting: Neurology

## 2015-07-20 ENCOUNTER — Encounter (HOSPITAL_COMMUNITY): Payer: Managed Care, Other (non HMO)

## 2015-07-22 ENCOUNTER — Encounter (HOSPITAL_COMMUNITY): Payer: Managed Care, Other (non HMO)

## 2015-07-25 ENCOUNTER — Encounter (HOSPITAL_COMMUNITY): Payer: Managed Care, Other (non HMO)

## 2015-07-26 ENCOUNTER — Encounter: Payer: Self-pay | Admitting: Cardiovascular Disease

## 2015-07-26 ENCOUNTER — Ambulatory Visit (INDEPENDENT_AMBULATORY_CARE_PROVIDER_SITE_OTHER): Payer: Managed Care, Other (non HMO) | Admitting: Cardiovascular Disease

## 2015-07-26 ENCOUNTER — Other Ambulatory Visit: Payer: Self-pay | Admitting: Neurology

## 2015-07-26 VITALS — BP 110/70 | HR 57 | Ht 63.0 in | Wt 202.0 lb

## 2015-07-26 DIAGNOSIS — E785 Hyperlipidemia, unspecified: Secondary | ICD-10-CM | POA: Diagnosis not present

## 2015-07-26 DIAGNOSIS — Z951 Presence of aortocoronary bypass graft: Secondary | ICD-10-CM | POA: Diagnosis not present

## 2015-07-26 DIAGNOSIS — E1169 Type 2 diabetes mellitus with other specified complication: Secondary | ICD-10-CM | POA: Insufficient documentation

## 2015-07-26 NOTE — Assessment & Plan Note (Signed)
History of CAD status post non-STEMI with cath performed by Dr. Claiborne Billings 04/05/15 that showed a 95% ostial LAD lesion.On 04/07/15 she underwent off pump LIMA graft placement by Dr. Roxy Manns and was discharged several days later. She participated in cardiac rehabilitation briefly. She is currently asymptomatic and back to work. She remains on high-dose statin therapy, aspirin, ACE inhibitor and beta blocker.

## 2015-07-26 NOTE — Progress Notes (Signed)
07/26/2015 Jachelle A Wuethrich   September 14, 1965  [id number]  Primary Physician Jonathon Bellows, MD Primary Cardiologist: Lorretta Harp MD Renae Gloss   HPI:  Mrs. 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 She has a history of multiple sclerosis the past as well as diabetes and remote tobacco abuse having quit in 2005. She had a non-STEMI on 04/05/15 with unstable angina. She is catheterized by Dr. Claiborne Billings revealing a 95% ostial LAD lesion and 2 days later underwent off-pump LIMA placement by Dr. Roxy Manns. She participated in cardiac rehabilitation briefly. She is currently symptomatically back to work. She denies chest pain or shortness of breath.   Current Outpatient Prescriptions  Medication Sig Dispense Refill  . acetaminophen (TYLENOL) 500 MG tablet Take 1,000 mg by mouth every 6 (six) hours as needed for mild pain.    Marland Kitchen aspirin EC 325 MG EC tablet Take 1 tablet (325 mg total) by mouth daily. 30 tablet 0  . atorvastatin (LIPITOR) 80 MG tablet Take 1 tablet (80 mg total) by mouth daily at 6 PM. 30 tablet 3  . baclofen (LIORESAL) 10 MG tablet TAKE ONE TABLET BY MOUTH THREE TIMES DAILY 90 tablet 5  . docusate sodium (COLACE) 100 MG capsule Take 100 mg by mouth 2 (two) times daily.    Marland Kitchen gabapentin (NEURONTIN) 300 MG capsule TAKE ONE CAPSULE BY MOUTH THREE TIMES DAILY 90 capsule 6  . lisinopril (PRINIVIL,ZESTRIL) 5 MG tablet Take 1 tablet (5 mg total) by mouth daily. 30 tablet 3  . metFORMIN (GLUCOPHAGE-XR) 500 MG 24 hr tablet Take 500 mg by mouth every evening.    . metoprolol tartrate (LOPRESSOR) 25 MG tablet Take 1 tablet (25 mg total) by mouth 2 (two) times daily. 60 tablet 3  . natalizumab (TYSABRI) 300 MG/15ML injection Inject 15 mLs (300 mg total) into the vein every 30 (thirty) days. 15 mL 11  . traZODone (DESYREL) 50 MG tablet Take 50-100 mg by mouth at bedtime.      No current facility-administered  medications for this visit.    No Known Allergies  Social History   Social History  . Marital Status: Married    Spouse Name: N/A  . Number of Children: 4  . Years of Education: college   Occupational History  . RN    Social History Main Topics  . Smoking status: Former Smoker -- 1.50 packs/day for 15 years    Types: Cigarettes    Quit date: 02/03/2004  . Smokeless tobacco: Never Used  . Alcohol Use: 0.6 oz/week    1 Glasses of wine per week  . Drug Use: No  . Sexual Activity:    Partners: Male    Birth Control/ Protection: Surgical     Comment: vasectomy   Other Topics Concern  . Not on file   Social History Narrative   Patient lives at home with her husband and children. Patient works for the [employer] in [place]. College education.      Review of Systems: General: negative for chills, fever, night sweats or weight changes.  Cardiovascular: negative for chest pain, dyspnea on exertion, edema, orthopnea, palpitations, paroxysmal nocturnal dyspnea or shortness of breath Dermatological: negative for rash Respiratory: negative for cough or wheezing Urologic: negative for hematuria Abdominal: negative for nausea, vomiting, diarrhea, bright red blood per rectum, melena, or hematemesis Neurologic: negative for visual changes, syncope, or dizziness All other systems reviewed and  are otherwise negative except as noted above.    Blood pressure 110/70, pulse 57, height 5\' 3"  (1.6 m), weight 202 lb (91.627 kg).  General appearance: alert and no distress Neck: no adenopathy, no carotid bruit, no JVD, supple, symmetrical, trachea midline and thyroid not enlarged, symmetric, no tenderness/mass/nodules Lungs: clear to auscultation bilaterally Heart: regular rate and rhythm, S1, S2 normal, no murmur, click, rub or gallop Extremities: extremities normal, atraumatic, no cyanosis or edema  EKG not performed today  ASSESSMENT AND PLAN:   S/P CABG x 1 History of CAD status  post non-STEMI with cath performed by Dr. Claiborne Billings 04/05/15 that showed a 95% ostial LAD lesion.On 04/07/15 she underwent off pump LIMA graft placement by Dr. Roxy Manns and was discharged several days later. She participated in cardiac rehabilitation briefly. She is currently asymptomatic and back to work. She remains on high-dose statin therapy, aspirin, ACE inhibitor and beta blocker.  Hyperlipidemia History of hyperlipidemia on high-dose statin therapy with recent lipid profile performed 04/05/15 revealed total cholesterol 199, LDL 144 and HDL of 21. We will recheck a lipid and liver profile      Lorretta Harp MD [facility], [facility] 07/26/2015 4:50 PM

## 2015-07-26 NOTE — Patient Instructions (Signed)
Medication Instructions:  Your physician recommends that you continue on your current medications as directed. Please refer to the Current Medication list given to you today.   Labwork: Your physician recommends that you return for lab work in: FASTING (lipid/liver) The lab can be found on the FIRST FLOOR of out building in [address]   Testing/Procedures: none  Follow-Up: We request that you follow-up in: 6 months with an extender and in 12 months with Dr Berry  You will receive a reminder letter in the mail two months in advance. If you don't receive a letter, please call our office to schedule the follow-up appointment.    Any Other Special Instructions Will Be Listed Below (If Applicable).     If you need a refill on your cardiac medications before your next appointment, please call your pharmacy.   

## 2015-07-26 NOTE — Assessment & Plan Note (Signed)
History of hyperlipidemia on high-dose statin therapy with recent lipid profile performed 04/05/15 revealed total cholesterol 199, LDL 144 and HDL of 21. We will recheck a lipid and liver profile

## 2015-07-27 ENCOUNTER — Encounter: Payer: Self-pay | Admitting: Neurology

## 2015-07-27 ENCOUNTER — Ambulatory Visit (INDEPENDENT_AMBULATORY_CARE_PROVIDER_SITE_OTHER): Payer: Managed Care, Other (non HMO) | Admitting: Neurology

## 2015-07-27 ENCOUNTER — Encounter (HOSPITAL_COMMUNITY): Payer: Managed Care, Other (non HMO)

## 2015-07-27 VITALS — BP 122/74 | HR 76 | Ht 63.0 in | Wt 202.0 lb

## 2015-07-27 DIAGNOSIS — I25810 Atherosclerosis of coronary artery bypass graft(s) without angina pectoris: Secondary | ICD-10-CM

## 2015-07-27 DIAGNOSIS — D649 Anemia, unspecified: Secondary | ICD-10-CM

## 2015-07-27 DIAGNOSIS — G2581 Restless legs syndrome: Secondary | ICD-10-CM | POA: Diagnosis not present

## 2015-07-27 DIAGNOSIS — G35 Multiple sclerosis: Secondary | ICD-10-CM | POA: Diagnosis not present

## 2015-07-27 MED ORDER — GABAPENTIN 300 MG PO CAPS: 600.0000 mg | ORAL_CAPSULE | Freq: Three times a day (TID) | ORAL | Status: DC

## 2015-07-27 NOTE — Progress Notes (Signed)
Chief Complaint  Patient presents with  . Multiple Sclerosis    Reports having increased difficulty with restless legs, especially at night.  Reports having a MI/CABG on 04/07/15.     [facility]  PATIENT: Kaylee Bryant DOB: Feb 01, 1966   REASON FOR VISIT: follow up for MS, relapsing remitting, fatigue abnormality of gait, back pain HISTORY FROM:patient    HISTORY OF PRESENT ILLNESS: HISTORY: She was diagnosed with RRMS in 2007, 6 month post partum, she had numbness from waist done, diagnosis was confirmed by marked abnormal MRI brain, in March,2007. There were multiple T2/Flair periventricular lesions, corpus callosum involvement, mutiple ring enhancing lesions. Also had enhancing lesion in cervical and thoracic spines. She was treated at [facility] by Dr. Dellis Filbert, no csf study, but had extensive lab evaluations to rule out mimics. 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 She denies visual changes or other clear relapsing episodes.  I have reviewed report MRI brain by Dr. Brandon Melnick in 01/2007, multiple T2/FLAIR lesions in both hemisphere with corpus callosum involvement, there are slight increase in lesion load. MRI cervical, stable lesions in C6-7, and C2-3. Thoracic, multiple non enhancing lesions.  She tolerated Rebif well, no flu like illness, but complains of bilateral arm, and the lateral thigh lipoatrophy with repeat Rebif injection. There was no clear relapsed over the years, but she has slow worsening gait difficulty over the past 6 months.  MRI in Sept 2012, MRI of thoracic spine Multiple chronic plaques at C7, T6-7, T8-9, T10.  MRI of cervical spine ,Multiple chronic demyelinating plaques at C3, C4 and C7. MRI of brain Multiple round and  ovioid, periventricular and subcortical chronic demyelinating plaques. 2 plaques noted in the pons. No abnormal lesions are seen on post contrast views.  Her JC-virus antibody was negative. After discuss, we started her on Tysarbri infusion since February of 2013. She tolerated the infusion very well, but complains of excessive fatigue over the past few months, she still able to work full-time as a Marine scientist, she complains of difficulty dragging herself out of bed. Modafinil was not approved by insurance.She was put on Ritalin 10mg  tid, which helped her fatigue. feels much better on Tysabri.  JC-virus antibody negative (July 2014), Negative 0.17 in 11/11/2013,   UPDATE December 16 2013: She is doing very well, her gait has improved, tolerating [name], Ritalin 10 mg tid. for her fatigue.JC-virus antibody negative 11/11/2013 .  Most recent MRI of the brain 10/21/2013 with multiple supra tentorial and infra-tentorial chronic demyelinating plaques. No acute plaques. No change from MRI 05/22/2013. She returns for reevaluation  UPDATE April 4th 2016: She works full time as Therapist, sports, s he has more dizziness spells, especially when she has to stand up, she has occasional headaches, She complains of intermittent sharp pain at her right lower thoracic region, X-ray showed DJD, excerbated by walking, Orthopedic evaluation was normal, including bilateral hip,  She is on Tysarbri,taking baclofen 10mg  tid, works well for her lower extremity She has no incotninence, if she stopped,   UPDATE July 26th 2016:She is now complainng of right side low back pain, radiating to right hip, right hip muscle cramping, sometimes difficulty getting out of the bed, to put on her clothes, 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 She continued  to complain fatigue, Ritalin 10 mg 3 times a day has been helpful She denies significant heat intolerance  UPDATE Jul 27 2015: Last clinical visit was with  Hoyle Sauer in October 2016, she had a CABG surgery in January twelfth 2017, recovered well, there was no new MS symptoms, she is no longer taking Ritalin, she has come back to work as a Equities trader   REVIEW OF SYSTEMS: Full 14 system review of systems performed and notable only for those listed, all others are neg:  As above  ALLERGIES: No Known Allergies  HOME MEDICATIONS: Outpatient Prescriptions Prior to Visit  Medication Sig Dispense Refill  . acetaminophen (TYLENOL) 500 MG tablet Take 1,000 mg by mouth every 6 (six) hours as needed for mild pain.    Marland Kitchen aspirin EC 325 MG EC tablet Take 1 tablet (325 mg total) by mouth daily. 30 tablet 0  . atorvastatin (LIPITOR) 80 MG tablet Take 1 tablet (80 mg total) by mouth daily at 6 PM. 30 tablet 3  . baclofen (LIORESAL) 10 MG tablet TAKE ONE TABLET BY MOUTH THREE TIMES DAILY 90 tablet 11  . docusate sodium (COLACE) 100 MG capsule Take 100 mg by mouth 2 (two) times daily.    Marland Kitchen gabapentin (NEURONTIN) 300 MG capsule TAKE ONE CAPSULE BY MOUTH THREE TIMES DAILY 90 capsule 6  . lisinopril (PRINIVIL,ZESTRIL) 5 MG tablet Take 1 tablet (5 mg total) by mouth daily. 30 tablet 3  . metFORMIN (GLUCOPHAGE-XR) 500 MG 24 hr tablet Take 500 mg by mouth every evening.    . metoprolol tartrate (LOPRESSOR) 25 MG tablet Take 1 tablet (25 mg total) by mouth 2 (two) times daily. 60 tablet 3  . natalizumab (TYSABRI) 300 MG/15ML injection Inject 15 mLs (300 mg total) into the vein every 30 (thirty) days. 15 mL 11  . traZODone (DESYREL) 50 MG tablet Take 50-100 mg by mouth at bedtime.      No facility-administered medications prior to visit.    PAST MEDICAL HISTORY: Past Medical History  Diagnosis Date  . Multiple sclerosis ([place])   . Fatigue   . Abnormal Pap smear 2004    HPV, cone biopsy  . DM (diabetes mellitus), gestational 2006    insulin at the time  . IBS (irritable bowel syndrome)     with chronic constipation  . DDD (degenerative disc disease), lumbar    . Coronary artery disease involving native coronary artery with unstable angina pectoris ([place]) 04/05/2015  . Obesity   . 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    LIMA to LAD off pump  . Heart attack ([place])     PAST SURGICAL HISTORY: Past Surgical History  Procedure Laterality Date  . Cholecystectomy      1988  . Cesarean section      1996, 2006  . Colposcopy  2004  . Cervical cone biopsy  2004  . Cardiac catheterization N/A 04/05/2015    Procedure: Left Heart Cath and Coronary Angiography;  Surgeon: Troy Sine, MD;  Location: [facility];  Service: Cardiovascular;  Laterality: N/A;  . Coronary artery bypass graft N/A 04/07/2015    Procedure: CORONARY ARTERY BYPASS GRAFTING (CABG), OFF PUMP, TIMES ONE, USING LEFT INTERNAL MAMMARY ARTERY;  Surgeon: Rexene Alberts, MD;  Location: [facility];  Service: Open Heart Surgery;  Laterality: N/A;  LIMA to LAD  . Tee without cardioversion N/A 04/07/2015    Procedure: TRANSESOPHAGEAL ECHOCARDIOGRAM (TEE);  Surgeon: Rexene Alberts, MD;  Location: [facility];  Service:  Open Heart Surgery;  Laterality: N/A;    FAMILY HISTORY: Family History  Problem Relation Age of Onset  . Diabetes Mother   . Hypertension Mother   . Hypertension Father   . Atrial fibrillation Father   . Diabetes Sister   . Breast cancer Maternal Aunt   . Arrhythmia Sister   . Heart attack Maternal Grandfather   . Stroke Neg Hx     SOCIAL HISTORY: Social History   Social History  . Marital Status: Married    Spouse Name: N/A  . Number of Children: 4  . Years of Education: college   Occupational History  . RN    Social History Main Topics  . Smoking status: Former Smoker -- 1.50 packs/day for 15 years    Types: Cigarettes    Quit date: 02/03/2004  . Smokeless tobacco: Never Used  . Alcohol Use: 0.6 oz/week    1 Glasses of wine per week  . Drug Use: No  . Sexual Activity:    Partners: Male    Birth Control/ Protection: Surgical     Comment: vasectomy   Other Topics  Concern  . Not on file   Social History Narrative   Patient lives at home with her husband and children. Patient works for the [employer] in [place]. College education.      PHYSICAL EXAM  Filed Vitals:   07/27/15 1611  BP: 122/74  Pulse: 76  Height: 5\' 3"  (1.6 m)  Weight: 202 lb (91.627 kg)   Body mass index is 35.79 kg/(m^2).  Generalized: Well developed, obese female in no acute distress  Head: normocephalic and atraumatic,. Oropharynx benign  Neck: Supple, no carotid bruits  Cardiac: Regular rate rhythm, no murmur  Musculoskeletal: No deformity   Neurological examination   Mentation: Alert oriented to time, place, history taking. Attention span and concentration appropriate. Recent and remote memory intact.  Follows all commands speech and language fluent.   Cranial nerve II-XII: Fundoscopic exam reveals sharp disc margins.Pupils were equal round reactive to light extraocular movements were full, visual field were full on confrontational test. Facial sensation and strength were normal. hearing was intact to finger rubbing bilaterally. Uvula tongue midline. head turning and shoulder shrug were normal and symmetric.Tongue protrusion into cheek strength was normal. Motor: She has mild spasticity of bilateral lower extremities, mild bilateral hip flexion weakness Sensory: normal and symmetric to light touch, pinprick, and  Vibration, proprioception in upper and lower extremities Coordination: finger-nose-finger, heel-to-shin bilaterally, no dysmetria Reflexes: Brachioradialis 2/2, biceps 2/2, triceps 2/2, patellar 2/2, Achilles 2/2, plantar responses were flexor bilaterally. Gait and Station: Rising up from seated position without assistance, wide based and cautious gait  smooth turning, able to perform tiptoe, and heel walking without difficulty. Tandem gait is mildly unsteady. No assistive device  DIAGNOSTIC DATA (LABS, IMAGING, TESTING) - I reviewed patient records, labs,  notes, testing and imaging myself where available.  Lab Results  Component Value Date   WBC 7.2 04/11/2015   HGB 12.2 04/11/2015   HCT 36.4 04/11/2015   MCV 89.0 04/11/2015   PLT 249 04/11/2015      Component Value Date/Time   NA 139 04/11/2015 0537   NA 140 01/18/2015 0000   K 3.6 04/11/2015 0537   CL 101 04/11/2015 0537   CO2 31 04/11/2015 0537   GLUCOSE 145* 04/11/2015 0537   GLUCOSE 209* 01/18/2015 0000   BUN 7 04/11/2015 0537   BUN 10 01/18/2015 0000   CREATININE 0.50 04/11/2015 0537  CREATININE 0.65 06/02/2013 1640   CALCIUM 8.5* 04/11/2015 0537   PROT 6.5 04/06/2015 1542   PROT 6.9 01/18/2015 0000   ALBUMIN 3.6 04/06/2015 1542   ALBUMIN 4.3 01/18/2015 0000   AST 61* 04/06/2015 1542   ALT 75* 04/06/2015 1542   ALKPHOS 55 04/06/2015 1542   BILITOT 0.8 04/06/2015 1542   BILITOT 0.4 01/18/2015 0000   GFRNONAA >60 04/11/2015 0537   GFRAA >60 04/11/2015 0537   JC-virus antibody negative (July 2014), Negative 0.17 in 11/11/2013, 07/07/2014 JCV 0.19 negative.  ASSESSMENT AND PLAN  50 y.o. year old female   Relapsing remitting multiple sclerosis  Repeat MRI of the brain with and without contrast  Repeat JC virus antibody  Continue with Tysarbri infusion  Restless leg syndrome  She has the urge to move her legs at evening and nighttime,  I reviewed laboratory in January 2017, mild anemia with hemoglobin 11.6,  Iron panel check ferritin level, increase gabapentin to 300 mg 2 tablets 3 times a day.  Marcial Pacas, M.D. Ph.D.  [facility] [place], [place] [address] Phone: [phone] Fax:      [phone]

## 2015-07-29 ENCOUNTER — Encounter (HOSPITAL_COMMUNITY): Payer: Managed Care, Other (non HMO)

## 2015-07-30 ENCOUNTER — Other Ambulatory Visit: Payer: Self-pay | Admitting: Thoracic Surgery (Cardiothoracic Vascular Surgery)

## 2015-07-30 ENCOUNTER — Other Ambulatory Visit: Payer: Self-pay | Admitting: Neurology

## 2015-08-01 ENCOUNTER — Encounter (HOSPITAL_COMMUNITY): Payer: Managed Care, Other (non HMO)

## 2015-08-01 ENCOUNTER — Other Ambulatory Visit: Payer: Self-pay | Admitting: Cardiovascular Disease

## 2015-08-01 NOTE — Telephone Encounter (Signed)
Rx request sent to pharmacy.  

## 2015-08-03 ENCOUNTER — Encounter (HOSPITAL_COMMUNITY): Payer: Managed Care, Other (non HMO)

## 2015-08-05 ENCOUNTER — Encounter (HOSPITAL_COMMUNITY): Payer: Managed Care, Other (non HMO)

## 2015-08-09 ENCOUNTER — Other Ambulatory Visit (INDEPENDENT_AMBULATORY_CARE_PROVIDER_SITE_OTHER): Payer: Self-pay

## 2015-08-09 DIAGNOSIS — Z0289 Encounter for other administrative examinations: Secondary | ICD-10-CM

## 2015-08-10 LAB — LIPID PANEL
Cholesterol: 96 mg/dL — ABNORMAL LOW (ref 125–200)
HDL: 40 mg/dL — ABNORMAL LOW (ref >=46)
LDL Cholesterol: 44 mg/dL (ref <130)
Total CHOL/HDL Ratio: 2.4 Ratio (ref <=5.0)
Triglycerides: 59 mg/dL (ref <150)
VLDL: 12 mg/dL (ref <30)

## 2015-08-10 LAB — IRON AND TIBC
Iron Saturation: 18 % (ref 15–55)
Iron: 63 ug/dL (ref 27–159)
Total Iron Binding Capacity: 349 ug/dL (ref 250–450)
UIBC: 286 ug/dL (ref 131–425)

## 2015-08-10 LAB — HEPATIC FUNCTION PANEL
ALT: 25 U/L (ref 6–29)
AST: 25 U/L (ref 10–35)
Albumin: 4.1 g/dL (ref 3.6–5.1)
Alkaline Phosphatase: 66 U/L (ref 33–115)
Bilirubin, Direct: 0.1 mg/dL (ref <=0.2)
Indirect Bilirubin: 0.4 mg/dL (ref 0.2–1.2)
Total Bilirubin: 0.5 mg/dL (ref 0.2–1.2)
Total Protein: 6.7 g/dL (ref 6.1–8.1)

## 2015-08-10 LAB — VITAMIN B12: Vitamin B-12: 498 pg/mL (ref 211–946)

## 2015-08-10 LAB — FOLATE: Folate: 12.6 ng/mL (ref >3.0)

## 2015-08-10 LAB — FERRITIN: Ferritin: 54 ng/mL (ref 15–150)

## 2015-08-15 ENCOUNTER — Telehealth: Payer: Self-pay | Admitting: *Deleted

## 2015-08-15 NOTE — Telephone Encounter (Signed)
Labs collected 08/10/15:  JCV - 0.13 negative

## 2015-08-31 ENCOUNTER — Encounter: Payer: Self-pay | Admitting: Nurse Practitioner

## 2015-09-06 ENCOUNTER — Telehealth: Payer: Self-pay | Admitting: Neurology

## 2015-09-06 NOTE — Telephone Encounter (Signed)
[facility] [phone] called inquiring if pt was still on tysabri. Please call

## 2015-09-06 NOTE — Telephone Encounter (Signed)
Returned call to Luanna Cole - she is aware that Rhodie is getting her infusions with [facility] and they buy & bill her Tysabri.

## 2015-09-12 ENCOUNTER — Ambulatory Visit: Payer: Managed Care, Other (non HMO) | Admitting: Neurology

## 2015-11-14 ENCOUNTER — Telehealth: Payer: Self-pay | Admitting: Neurology

## 2015-11-14 NOTE — Telephone Encounter (Signed)
Spoke to patient - she would like to schedule MRI brain w/wo.  Her appt notes state that the PA was only valid through 08/26/15.  It will need new approval prior to scheduling scan.

## 2015-11-14 NOTE — Telephone Encounter (Signed)
Patient was suppose to get a MRI but she said she never scheduled it.Kaylee Bryant She wants to know if Dr. Krista Blue still wants her to have a MRI. If so she will schedule it.. The best number to contact patient is [phone]

## 2015-11-15 NOTE — Telephone Encounter (Signed)
Called patient to give her the number to [facility].

## 2015-11-26 ENCOUNTER — Ambulatory Visit
Admission: RE | Admit: 2015-11-26 | Discharge: 2015-11-26 | Disposition: A | Discharge status: Home / Self Care | Payer: Managed Care, Other (non HMO) | Source: Ambulatory Visit | Attending: Neurology | Admitting: Neurology

## 2015-11-26 DIAGNOSIS — I25810 Atherosclerosis of coronary artery bypass graft(s) without angina pectoris: Secondary | ICD-10-CM

## 2015-11-26 DIAGNOSIS — G35 Multiple sclerosis: Secondary | ICD-10-CM | POA: Diagnosis not present

## 2015-11-26 MED ORDER — GADOBENATE DIMEGLUMINE 529 MG/ML IV SOLN
20.0000 mL | Freq: Once | INTRAVENOUS | Status: AC | PRN
  Administered 2015-11-26: 18 mL via INTRAVENOUS

## 2015-11-29 ENCOUNTER — Encounter: Payer: Self-pay | Admitting: Neurology

## 2015-11-30 ENCOUNTER — Other Ambulatory Visit (INDEPENDENT_AMBULATORY_CARE_PROVIDER_SITE_OTHER): Payer: Self-pay

## 2015-11-30 ENCOUNTER — Telehealth: Payer: Self-pay | Admitting: Neurology

## 2015-11-30 ENCOUNTER — Other Ambulatory Visit: Payer: Self-pay | Admitting: *Deleted

## 2015-11-30 DIAGNOSIS — R7309 Other abnormal glucose: Secondary | ICD-10-CM

## 2015-11-30 DIAGNOSIS — Z0289 Encounter for other administrative examinations: Secondary | ICD-10-CM

## 2015-11-30 DIAGNOSIS — R2689 Other abnormalities of gait and mobility: Secondary | ICD-10-CM

## 2015-11-30 DIAGNOSIS — R252 Cramp and spasm: Secondary | ICD-10-CM

## 2015-11-30 DIAGNOSIS — G35 Multiple sclerosis: Secondary | ICD-10-CM

## 2015-11-30 DIAGNOSIS — R269 Unspecified abnormalities of gait and mobility: Secondary | ICD-10-CM

## 2015-11-30 DIAGNOSIS — R531 Weakness: Secondary | ICD-10-CM

## 2015-11-30 NOTE — Telephone Encounter (Signed)
Kaylee Bryant has no signs/symptoms of infection.  She is agreeable to Solu-Medrol treatment.  She is agreeable to cervical and thoracic MRIs.  Per Dr. Krista Blue:  1) Labs: CBC w/ Diff, UA w/ Reflex, A1C, BMP  2) If no infection noted, proceed w/ Solu-Medrol 1 gram x 3 days.  3) Order MRI cervical and thoracic - both w and w/o.  Kaylee Bryant will come in today for labs.

## 2015-11-30 NOTE — Telephone Encounter (Signed)
Email from patient:  I have a question about MRI BRAIN W/WO CONTRAST resulted on 11/27/15 at 1:51 PM.   For 2 or 3 weeks my balance has significantly changed. Have started to trip over my own feet and my jaw has tension and spasms. Both are new with me.

## 2015-12-01 ENCOUNTER — Telehealth: Payer: Self-pay | Admitting: Neurology

## 2015-12-01 LAB — HEMOGLOBIN A1C
Est. average glucose Bld gHb Est-mCnc: 137 mg/dL
Hgb A1c MFr Bld: 6.4 % — ABNORMAL HIGH (ref 4.8–5.6)

## 2015-12-01 LAB — URINALYSIS, ROUTINE W REFLEX MICROSCOPIC
Bilirubin, UA: NEGATIVE
Glucose, UA: NEGATIVE
Ketones, UA: NEGATIVE
Leukocytes, UA: NEGATIVE
Nitrite, UA: NEGATIVE
Protein, UA: NEGATIVE
RBC, UA: NEGATIVE
Specific Gravity, UA: 1.008 (ref 1.005–1.030)
Urobilinogen, Ur: 0.2 mg/dL (ref 0.2–1.0)
pH, UA: 6.5 (ref 5.0–7.5)

## 2015-12-01 LAB — CBC WITH DIFFERENTIAL/PLATELET
Basophils Absolute: 0.1 10*3/uL (ref 0.0–0.2)
Basos: 1 %
EOS (ABSOLUTE): 0.2 10*3/uL (ref 0.0–0.4)
Eos: 2 %
Hematocrit: 40.9 % (ref 34.0–46.6)
Hemoglobin: 13.9 g/dL (ref 11.1–15.9)
Immature Grans (Abs): 0 10*3/uL (ref 0.0–0.1)
Immature Granulocytes: 0 %
Lymphocytes Absolute: 5.5 10*3/uL — ABNORMAL HIGH (ref 0.7–3.1)
Lymphs: 52 %
MCH: 30.3 pg (ref 26.6–33.0)
MCHC: 34 g/dL (ref 31.5–35.7)
MCV: 89 fL (ref 79–97)
Monocytes Absolute: 0.8 10*3/uL (ref 0.1–0.9)
Monocytes: 7 %
Neutrophils Absolute: 4.1 10*3/uL (ref 1.4–7.0)
Neutrophils: 38 %
Platelets: 294 10*3/uL (ref 150–379)
RBC: 4.59 x10E6/uL (ref 3.77–5.28)
RDW: 14.2 % (ref 12.3–15.4)
WBC: 10.7 10*3/uL (ref 3.4–10.8)

## 2015-12-01 LAB — BASIC METABOLIC PANEL
BUN/Creatinine Ratio: 23 (ref 9–23)
BUN: 17 mg/dL (ref 6–24)
CO2: 29 mmol/L (ref 18–29)
Calcium: 9.6 mg/dL (ref 8.7–10.2)
Chloride: 100 mmol/L (ref 96–106)
Creatinine, Ser: 0.74 mg/dL (ref 0.57–1.00)
GFR calc Af Amer: 110 mL/min/{1.73_m2} (ref >59)
GFR calc non Af Amer: 95 mL/min/{1.73_m2} (ref >59)
Glucose: 96 mg/dL (ref 65–99)
Potassium: 4.7 mmol/L (ref 3.5–5.2)
Sodium: 143 mmol/L (ref 134–144)

## 2015-12-01 MED ORDER — BACLOFEN 10 MG PO TABS: 20.0000 mg | ORAL_TABLET | Freq: Three times a day (TID) | ORAL | 11 refills | Status: DC

## 2015-12-01 NOTE — Addendum Note (Signed)
Addended by: Marcial Pacas on: 12/01/2015 02:04 PM   Modules accepted: Orders

## 2015-12-01 NOTE — Telephone Encounter (Signed)
I have called patient, Normal laboratory evaluation except mild elevated A1c 6.4,   There is no signs of active infection, normal CBC, UA,  I saw patient yesterday briefly, she does has mild increased gait abnormality, today she also complains of jaw tightness, after discussion with the potential risk of IV Solu-Medrol treatment,  We decided to increase baclofen 10 mg 2 tablets 3 times a day, if her symptoms improved with the next week, we will hold off IV Solu-Medrol steroid treatment.   Documentation

## 2015-12-01 NOTE — Telephone Encounter (Addendum)
I have called patient, Normal laboratory evaluation except mild elevated A1c 6.4,   There is no signs of active infection, normal CBC, UA,  I saw patient yesterday briefly, she does has mild increased gait abnormality, today she also complains of jaw tightness, after discussion with the potential risk of IV Solu-Medrol treatment,  We decided to increase baclofen 10 mg 2 tablets 3 times a day, if her symptoms improved with the next week, we will hold off IV Solu-Medrol steroid treatment.

## 2015-12-10 ENCOUNTER — Ambulatory Visit
Admission: RE | Admit: 2015-12-10 | Discharge: 2015-12-10 | Disposition: A | Discharge status: Home / Self Care | Payer: Managed Care, Other (non HMO) | Source: Ambulatory Visit | Attending: Neurology | Admitting: Neurology

## 2015-12-10 ENCOUNTER — Other Ambulatory Visit: Payer: Self-pay | Admitting: Neurology

## 2015-12-10 DIAGNOSIS — R269 Unspecified abnormalities of gait and mobility: Secondary | ICD-10-CM

## 2015-12-10 DIAGNOSIS — R2689 Other abnormalities of gait and mobility: Secondary | ICD-10-CM

## 2015-12-10 DIAGNOSIS — R252 Cramp and spasm: Secondary | ICD-10-CM

## 2015-12-10 DIAGNOSIS — G35 Multiple sclerosis: Secondary | ICD-10-CM

## 2015-12-27 ENCOUNTER — Ambulatory Visit: Payer: Self-pay | Admitting: Neurology

## 2015-12-27 ENCOUNTER — Encounter: Payer: Self-pay | Admitting: Neurology

## 2015-12-27 NOTE — Progress Notes (Deleted)
See separate documentation for research study

## 2015-12-29 ENCOUNTER — Other Ambulatory Visit: Payer: Self-pay | Admitting: Neurology

## 2015-12-29 DIAGNOSIS — G35 Multiple sclerosis: Secondary | ICD-10-CM

## 2016-01-03 ENCOUNTER — Encounter: Payer: Self-pay | Admitting: Nurse Practitioner

## 2016-01-09 ENCOUNTER — Other Ambulatory Visit: Payer: Managed Care, Other (non HMO)

## 2016-01-09 ENCOUNTER — Telehealth: Payer: Self-pay | Admitting: Neurology

## 2016-01-09 NOTE — Telephone Encounter (Signed)
Spoke to Kaylee Bryant - she has been scheduled for a follow up with Dr. Krista Blue.  She is aware to expect a call from [facility] to reschedule her Tysabri infusions.

## 2016-01-09 NOTE — Telephone Encounter (Signed)
Make sure she is on her schedule for Tysarbri infusion monthly, revisit with NP 6 months from previous visit.  She is not qualified for research.

## 2016-01-17 IMAGING — CR DG CHEST 2V
2 series · 2 of 2 positions shown · non-contrast
Comparison: 04/09/2015

CLINICAL DATA: CABG x3

EXAM:
CHEST  2 VIEW

[chest pa]
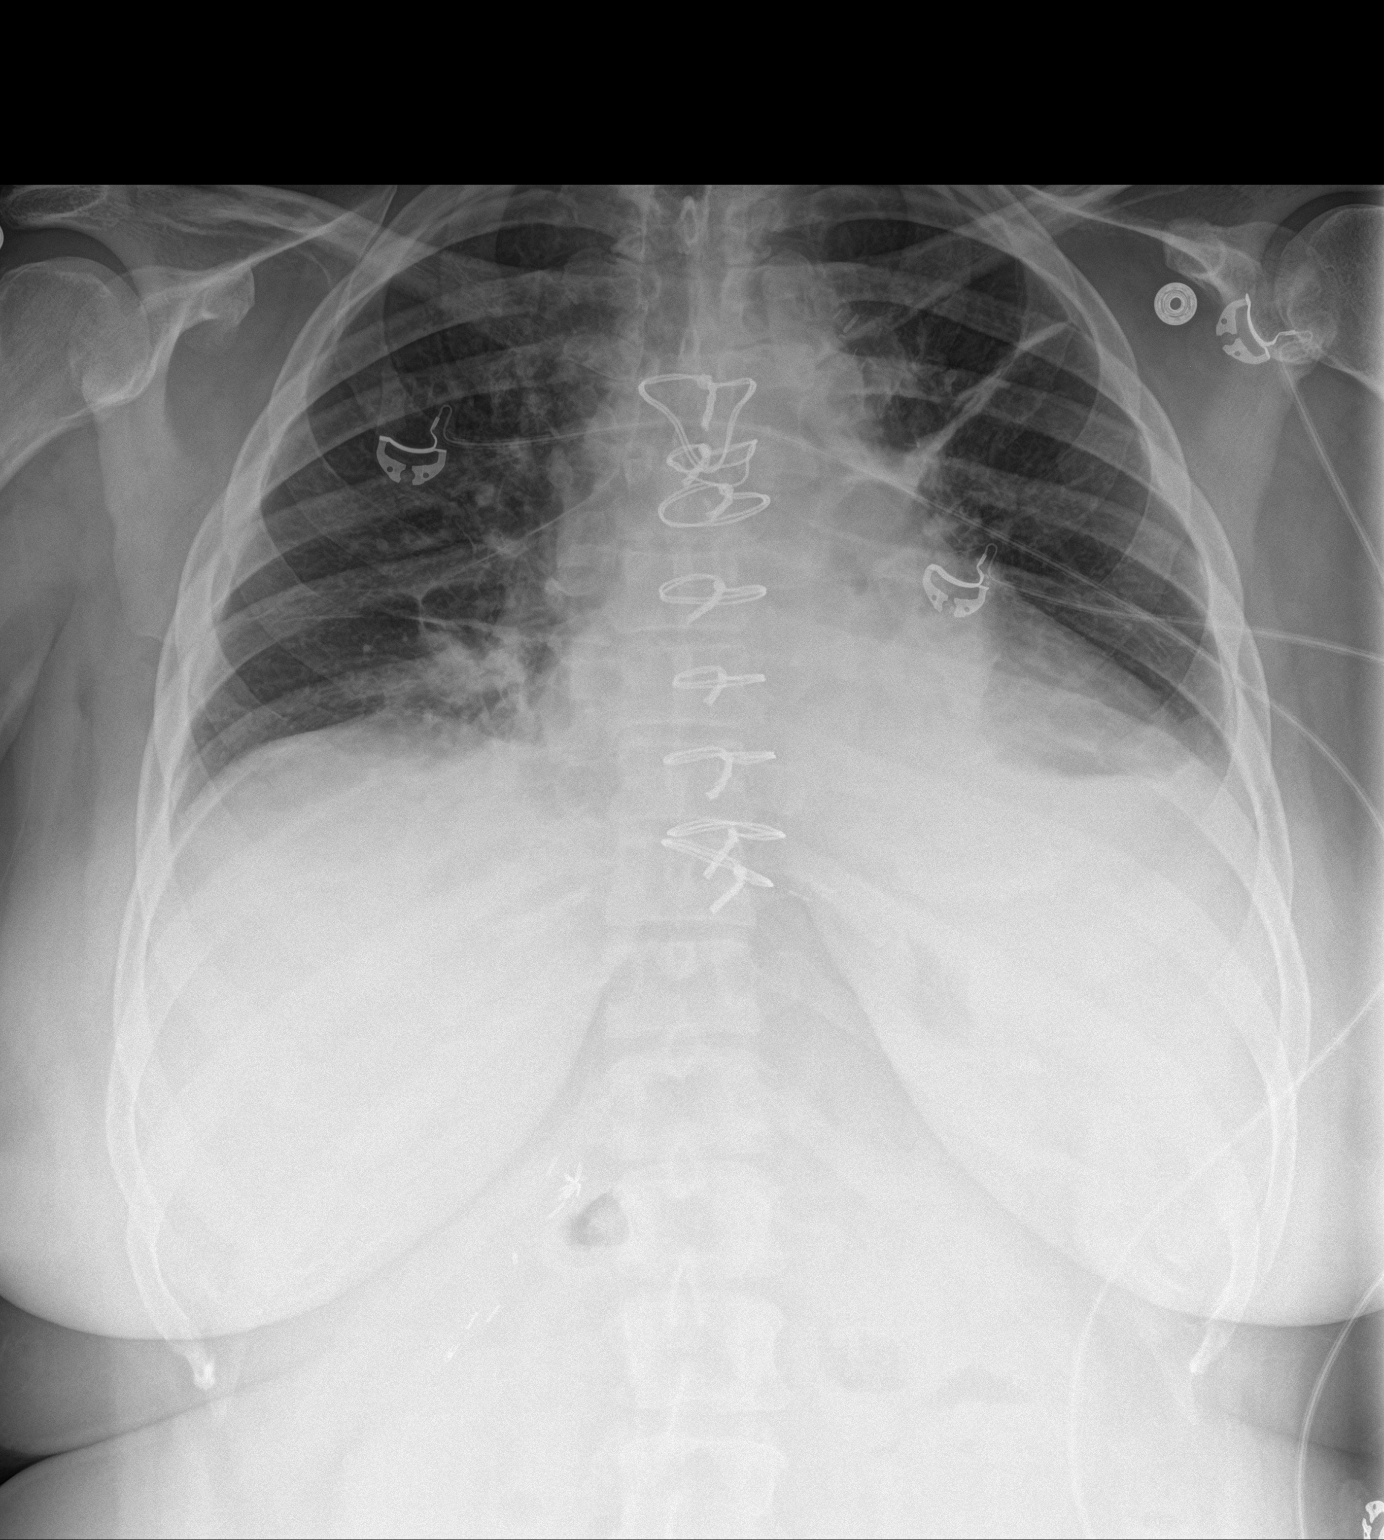

[chest lat]
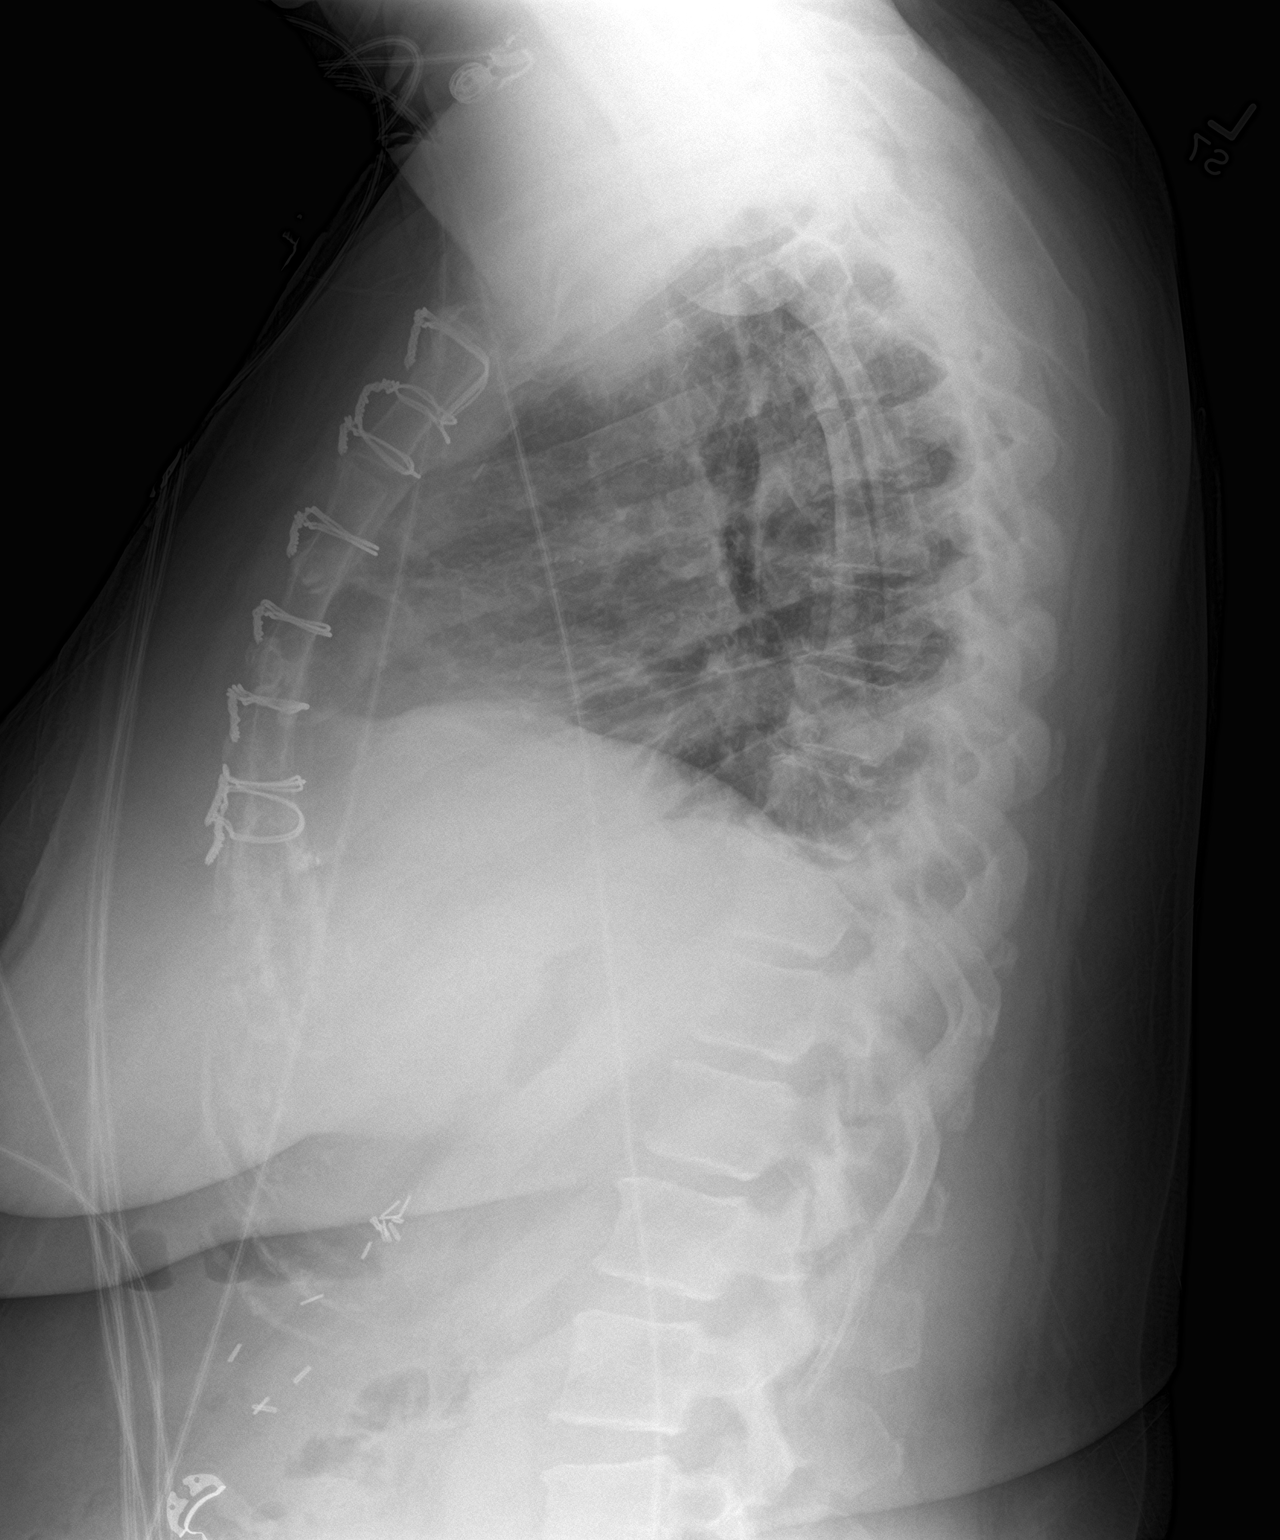

[2 of 2 positions shown; findings below may reference images not displayed]

FINDINGS: Sternotomy wires overlie normal cardiac silhouette. LEFT pleural
effusion and LEFT lower lobe atelectasis not changed. Linear band of
atelectasis in LEFT upper lobe unchanged. Central venous congestion.
No pneumothorax.
IMPRESSION: 1. No significant change.
2. LEFT effusion and basilar atelectasis.

## 2016-01-19 NOTE — Telephone Encounter (Signed)
Patient called, states she hasn't heard from anyone regarding re-starting Tysabri infusions, please call [phone].

## 2016-01-19 NOTE — Telephone Encounter (Signed)
Provided to [facility] to return call to patient for scheduling.

## 2016-01-24 ENCOUNTER — Ambulatory Visit: Payer: Self-pay | Admitting: Neurology

## 2016-01-24 ENCOUNTER — Encounter: Payer: Self-pay | Admitting: Neurology

## 2016-01-30 ENCOUNTER — Telehealth: Payer: Self-pay | Admitting: Neurology

## 2016-01-30 NOTE — Telephone Encounter (Signed)
Information provided to [facility], who is responsible for scheduling Tysabri infusions.

## 2016-01-30 NOTE — Telephone Encounter (Signed)
[place] [phone] called to schedule appointment for Tysabri infusion.

## 2016-02-08 ENCOUNTER — Encounter: Payer: Self-pay | Admitting: Neurology

## 2016-02-08 ENCOUNTER — Ambulatory Visit (INDEPENDENT_AMBULATORY_CARE_PROVIDER_SITE_OTHER): Payer: Managed Care, Other (non HMO) | Admitting: Neurology

## 2016-02-08 VITALS — BP 130/75 | HR 59 | Ht 63.0 in | Wt 210.5 lb

## 2016-02-08 DIAGNOSIS — G2581 Restless legs syndrome: Secondary | ICD-10-CM | POA: Diagnosis not present

## 2016-02-08 DIAGNOSIS — G35 Multiple sclerosis: Secondary | ICD-10-CM

## 2016-02-08 DIAGNOSIS — F329 Major depressive disorder, single episode, unspecified: Secondary | ICD-10-CM

## 2016-02-08 DIAGNOSIS — F32A Depression, unspecified: Secondary | ICD-10-CM

## 2016-02-08 MED ORDER — TRAZODONE HCL 50 MG PO TABS: 150.0000 mg | ORAL_TABLET | Freq: Every day | ORAL | 11 refills | Status: DC

## 2016-02-08 MED ORDER — VENLAFAXINE HCL ER 37.5 MG PO CP24: 75.0000 mg | ORAL_CAPSULE | Freq: Every day | ORAL | 11 refills | Status: DC

## 2016-02-08 NOTE — Progress Notes (Signed)
Chief Complaint  Patient presents with  . Multiple Sclerosis    Reports increased fatigue since being off Tysabri nearly three months.  She restarted Tysabri last week.  Feels her depression is worse.  Also, concerned about neck pain that she feels are muscle spasms.     [facility]  PATIENT: Kaylee Bryant DOB: [birth year]   REASON FOR VISIT: follow up for MS, relapsing remitting, fatigue abnormality of gait, back pain HISTORY FROM:patient    HISTORY OF PRESENT ILLNESS: HISTORY: She was diagnosed with RRMS in 2007, 6 month post partum, she had numbness from waist done, diagnosis was confirmed by marked abnormal MRI brain, in March,2007. There were multiple T2/Flair periventricular lesions, corpus callosum involvement, mutiple ring enhancing lesions. Also had enhancing lesion in cervical and thoracic spines. She was treated at [facility] by Dr. Dellis Filbert, no csf study, but had extensive lab evaluations to rule out mimics. 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 She denies visual changes or other clear relapsing episodes.  I have reviewed report MRI brain by Dr. Brandon Melnick in 01/2007, multiple T2/FLAIR lesions in both hemisphere with corpus callosum involvement, there are slight increase in lesion load. MRI cervical, stable lesions in C6-7, and C2-3. Thoracic, multiple non enhancing lesions.  She tolerated Rebif well, no flu like illness, but complains of bilateral arm, and the lateral thigh lipoatrophy with repeat Rebif injection. There was no clear relapsed over the years, but she has slow worsening gait difficulty over the past 6 months.  MRI in Sept 2012, MRI of thoracic spine Multiple chronic plaques at C7, T6-7, T8-9, T10.  MRI of cervical spine  ,Multiple chronic demyelinating plaques at C3, C4 and C7. MRI of brain Multiple round and ovioid, periventricular and subcortical chronic demyelinating plaques. 2 plaques noted in the pons. No abnormal lesions are seen on post contrast views.  Her JC-virus antibody was negative. After discuss, we started her on Tysarbri infusion since February of 2013. She tolerated the infusion very well, but complains of excessive fatigue over the past few months, she still able to work full-time as a Marine scientist, she complains of difficulty dragging herself out of bed. Modafinil was not approved by insurance.She was put on Ritalin 10mg  tid, which helped her fatigue. feels much better on Tysabri.  JC-virus antibody negative (July 2014), Negative 0.17 in 11/11/2013,   UPDATE December 16 2013: She is doing very well, her gait has improved, tolerating [name], Ritalin 10 mg tid. for her fatigue.JC-virus antibody negative 11/11/2013 .  Most recent MRI of the brain 10/21/2013 with multiple supra tentorial and infra-tentorial chronic demyelinating plaques. No acute plaques. No change from MRI 05/22/2013. She returns for reevaluation  UPDATE April 4th 2016: She works full time as Therapist, sports, s he has more dizziness spells, especially when she has to stand up, she has occasional headaches, She complains of intermittent sharp pain at her right lower thoracic region, X-ray showed DJD, excerbated by walking, Orthopedic evaluation was normal, including bilateral hip,  She is on Tysarbri,taking baclofen 10mg  tid, works well for her lower extremity She has no incotninence, if she stopped,   UPDATE July 26th 2016:She is now complainng of right side low back pain, radiating to right hip, right hip muscle cramping, sometimes difficulty getting out of the bed, to put on  her clothes, 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 She now noticed more fatigue, depression, also noticed mild worsening unsteady gait  I reviewed laboratory evaluations, ferritin 54, vitamin [id number], folic acid, iron level was normal, LDL 44, cholesterol 96, A1c 6.4, normal BMP, UA,  REVIEW OF SYSTEMS: Full 14 system review of systems performed and notable only for those listed, all others are neg:  As above  ALLERGIES: No Known Allergies  HOME MEDICATIONS: Outpatient Medications Prior to Visit  Medication Sig Dispense Refill  . acetaminophen (TYLENOL) 500 MG tablet Take 1,000 mg by mouth every 6 (six) hours as needed for mild pain.    Marland Kitchen aspirin EC 325 MG EC tablet Take 1 tablet (325 mg total) by mouth daily. 30 tablet 0  . atorvastatin (LIPITOR) 80 MG tablet TAKE 1 TABLET BY MOUTH EVERY DAY AT 6PM 30 tablet 11  . baclofen (LIORESAL) 10 MG tablet Take 2 tablets (20 mg total) by mouth 3 (three) times daily. 180 tablet 11  . docusate sodium (COLACE) 100 MG capsule Take 100 mg by mouth 2 (two) times daily.    Marland Kitchen gabapentin (NEURONTIN) 300 MG capsule Take 2 capsules (600 mg total) by mouth 3 (three) times daily. 180 capsule 11  . lisinopril (PRINIVIL,ZESTRIL) 5 MG tablet TAKE 1 TABLET BY MOUTH EVERY DAY 30 tablet 11  . metFORMIN (GLUCOPHAGE-XR) 500 MG 24 hr tablet Take 500 mg by mouth every evening.    . metoprolol tartrate (LOPRESSOR) 25 MG tablet TAKE 1 TABLET BY MOUTH TWICE A DAY 60 tablet 11  . natalizumab (TYSABRI) 300 MG/15ML injection Inject  15 mLs (300 mg total) into the vein every 30 (thirty) days. 15 mL 11  . traZODone (DESYREL) 50 MG tablet Take 50-100 mg by mouth at bedtime.      No facility-administered medications prior to visit.     PAST MEDICAL HISTORY: Past Medical History:  Diagnosis Date  . 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 Coronary artery disease involving native coronary artery with unstable angina pectoris ([place]) 04/05/2015  . DDD (degenerative disc disease), lumbar   . DM (diabetes mellitus), gestational 2006   insulin at the time  . Fatigue   . Heart attack   . IBS (irritable bowel syndrome)    with chronic constipation  . Multiple sclerosis ([place])   . Obesity   . 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 CARDIAC CATHETERIZATION N/A 04/05/2015   Procedure: Left Heart Cath and Coronary Angiography;  Surgeon: Troy Sine, MD;  Location: [facility];  Service: Cardiovascular;  Laterality: N/A;  . CERVICAL CONE BIOPSY  2004  . [place], 2006  . CHOLECYSTECTOMY  [place]  2004  . CORONARY ARTERY BYPASS GRAFT N/A 04/07/2015   Procedure: CORONARY ARTERY BYPASS GRAFTING (CABG), OFF PUMP, TIMES ONE, USING LEFT INTERNAL MAMMARY ARTERY;  Surgeon: Rexene Alberts, MD;  Location: [facility];  Service: Open Heart Surgery;  Laterality: N/A;  LIMA to LAD  . TEE WITHOUT CARDIOVERSION N/A 04/07/2015   Procedure: TRANSESOPHAGEAL ECHOCARDIOGRAM (TEE);  Surgeon: Rexene Alberts, MD;  Location: [facility];  Service: Open Heart Surgery;  Laterality: N/A;    FAMILY HISTORY: Family History  Problem Relation Age of Onset  . Diabetes Mother   . Hypertension Mother   . Hypertension Father   . Atrial fibrillation Father   . Diabetes Sister   . Breast cancer Maternal Aunt   . Arrhythmia Sister   . Heart attack Maternal Grandfather   . Stroke Neg Hx     SOCIAL HISTORY: Social History   Social  History  . Marital status: Married    Spouse name: N/A  . Number of children: 4  . Years of education: college   Occupational History  . 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 Smoking status: Former Smoker    Packs/day: 1.50    Years: 15.00    Types: Cigarettes    Quit date: 02/03/2004  . Smokeless tobacco: Never Used  . Alcohol use 0.6 oz/week    1 Glasses of wine per week  . Drug use: No  . Sexual activity: Yes    Partners: Male    Birth control/ protection: Surgical     Comment: vasectomy   Other Topics Concern  . Not on file   Social History Narrative   Patient lives at home with her husband and children. Patient works for the [employer] in [place]. College education.      PHYSICAL EXAM  Vitals:   02/08/16 1600  BP: 130/75  Pulse: (!) 59  Weight: 210 lb 8 oz (95.5 kg)  Height: 5\' 3"  (1.6 m)   Body mass index is 37.29 kg/m.  Generalized: Well developed, obese female in no acute distress  Head: normocephalic and atraumatic,. Oropharynx benign  Neck: Supple, no carotid bruits  Cardiac: Regular rate rhythm, no murmur  Musculoskeletal: No deformity   Neurological examination   Mentation: Alert oriented to time, place, history taking. Attention span and concentration appropriate. Recent and remote memory intact.  Follows all commands speech and language fluent.   Cranial nerve II-XII: Fundoscopic exam reveals sharp disc margins.Pupils were equal round reactive to light extraocular movements were full, visual field were full on confrontational test. Facial sensation and strength were normal. hearing was intact to finger rubbing bilaterally. Uvula tongue midline. head turning and shoulder shrug were normal and symmetric.Tongue protrusion into cheek strength was normal. Motor: She has mild spasticity of bilateral lower extremities, mild bilateral hip flexion weakness Sensory: normal and symmetric to light touch, pinprick, and  Vibration,  proprioception in upper and lower extremities Coordination: finger-nose-finger, heel-to-shin bilaterally, no dysmetria Reflexes: Brachioradialis 2/2, biceps 2/2, triceps 2/2, patellar 2/2, Achilles 2/2, plantar responses were flexor bilaterally. Gait and Station: Rising up from seated position without assistance, wide based and cautious gait  smooth turning, able to perform tiptoe, and heel walking without difficulty. Tandem gait is mildly unsteady. No assistive device  DIAGNOSTIC DATA (LABS, IMAGING, TESTING) - I reviewed patient records, labs, notes, testing and imaging myself where available.  Lab Results  Component Value Date   WBC 10.7 11/30/2015  HGB 12.2 04/11/2015   HCT 40.9 11/30/2015   MCV 89 11/30/2015   PLT 294 11/30/2015      Component Value Date/Time   NA 143 11/30/2015 1626   K 4.7 11/30/2015 1626   CL 100 11/30/2015 1626   CO2 29 11/30/2015 1626   GLUCOSE 96 11/30/2015 1626   GLUCOSE 145 (H) 04/11/2015 0537   BUN 17 11/30/2015 1626   CREATININE 0.74 11/30/2015 1626   CREATININE 0.65 06/02/2013 1640   CALCIUM 9.6 11/30/2015 1626   PROT 6.7 08/09/2015 0854   PROT 6.9 01/18/2015 0000   ALBUMIN 4.1 08/09/2015 0854   ALBUMIN 4.3 01/18/2015 0000   AST 25 08/09/2015 0854   ALT 25 08/09/2015 0854   ALKPHOS 66 08/09/2015 0854   BILITOT 0.5 08/09/2015 0854   BILITOT 0.4 01/18/2015 0000   GFRNONAA 95 11/30/2015 1626   GFRAA 110 11/30/2015 1626   Tysarbri infusion since Feb 2013 JC-virus antibody negative (July 2014), Negative 0.17 in 11/11/2013, 07/07/2014 JCV 0.19 negative.  ASSESSMENT AND PLAN  50 y.o. year old female   Relapsing remitting multiple sclerosis  Continue with Tysarbri infusion  If she has worsening gait abnormality, may consider switch treatment  Restless leg syndrome  She has the urge to move her legs at evening and nighttime,  I reviewed laboratory in January 2017, mild anemia with hemoglobin 11.6,  Iron panel check ferritin level, increase  gabapentin to 300 mg 2 tablets 3 times a day.  Depression:   Increase trazodone to 50mg  3 tabs qhs add on effexor ER 37.5mg  2 tabs qhs.  Marcial Pacas, M.D. Ph.D.  [facility] [place], [place] [address] Phone: [phone] Fax:      [phone]

## 2016-02-09 ENCOUNTER — Other Ambulatory Visit: Payer: Self-pay | Admitting: *Deleted

## 2016-02-09 MED ORDER — VENLAFAXINE HCL ER 75 MG PO CP24: 75.0000 mg | ORAL_CAPSULE | Freq: Every day | ORAL | 11 refills | Status: DC

## 2016-02-15 IMAGING — CR DG CHEST 2V
2 series · 2 of 2 positions shown · non-contrast
Comparison: PA and lateral chest x-ray April 11, 2015.

CLINICAL DATA: Follow-up CABG on April 07, 2015

EXAM:
CHEST  2 VIEW

[w chest pa]
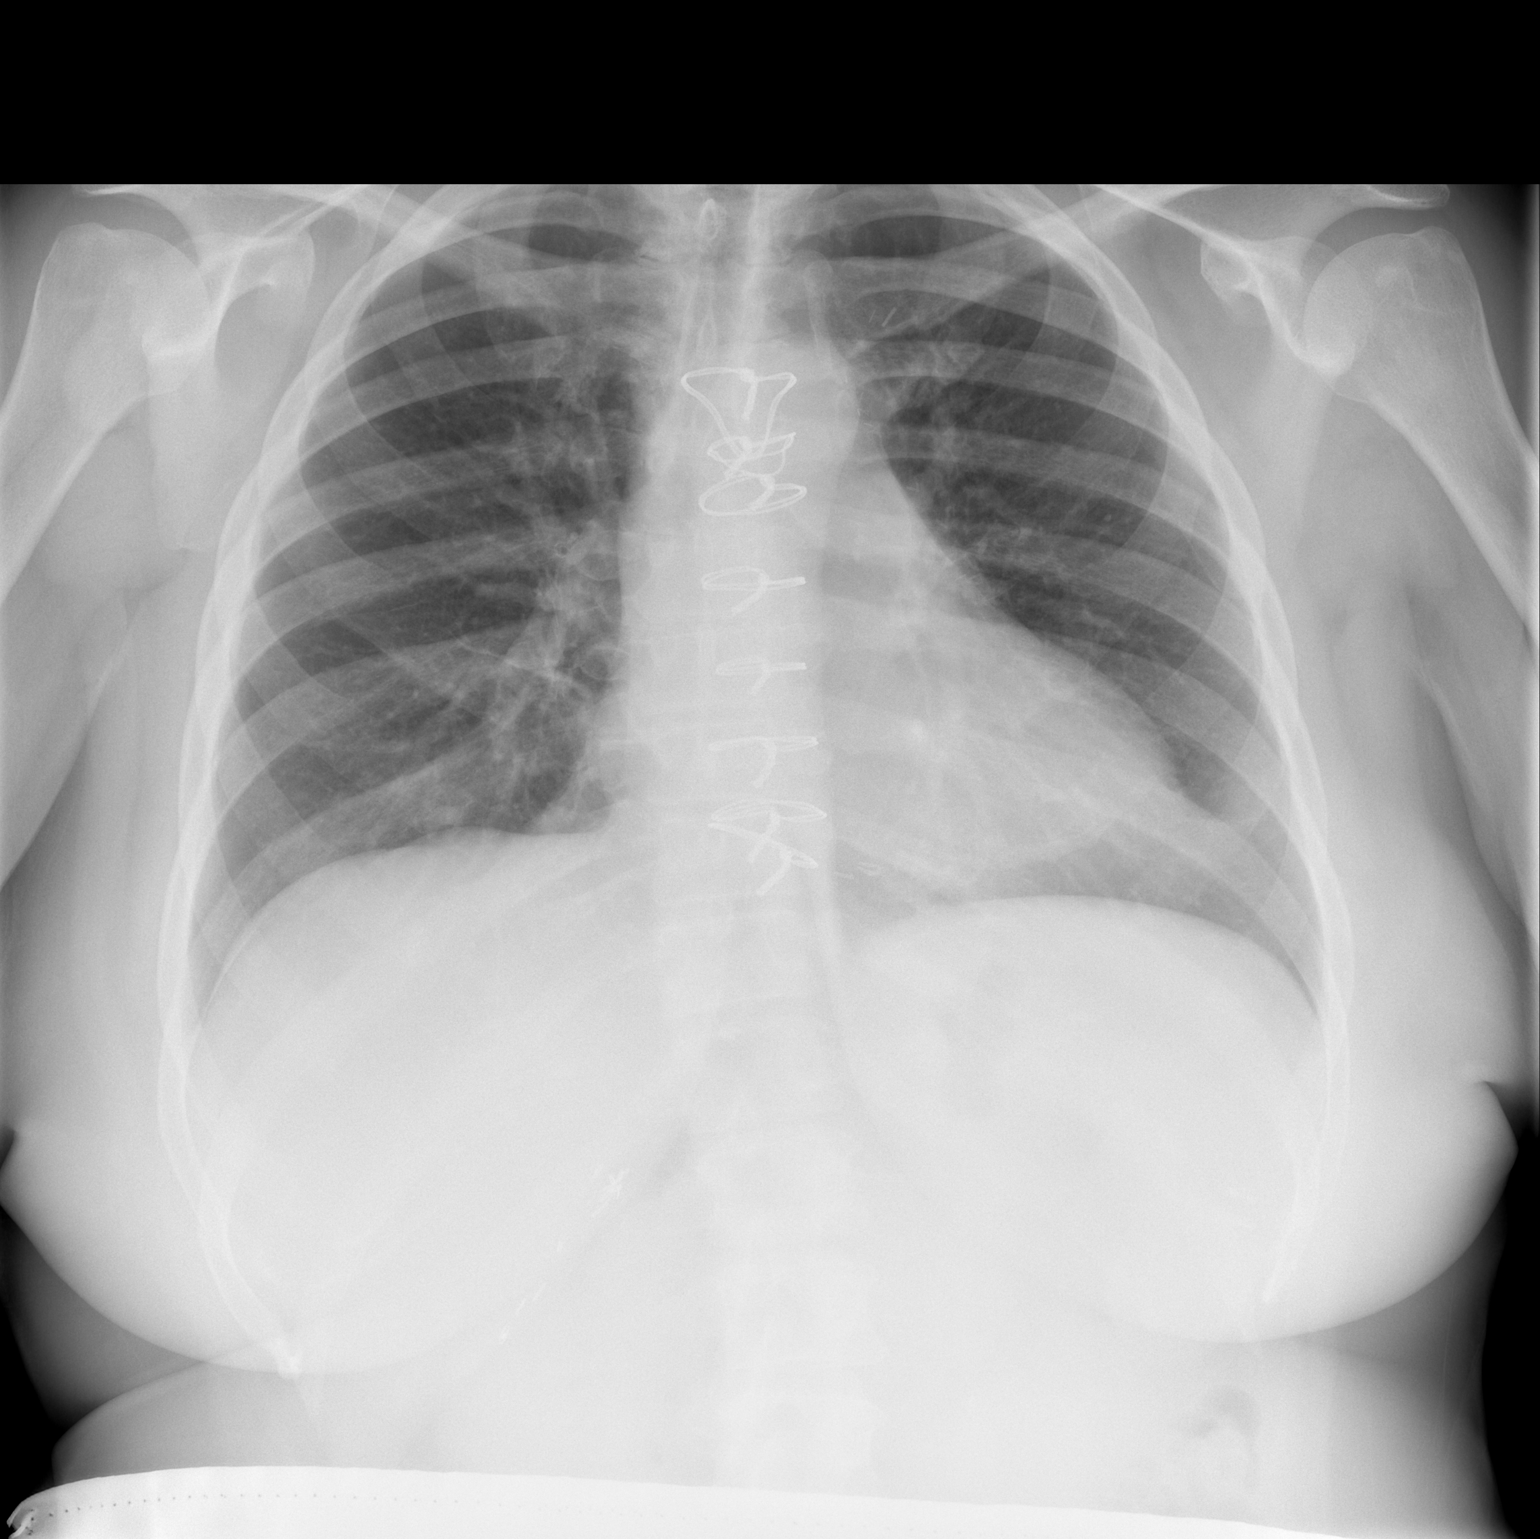

[w chest lat]
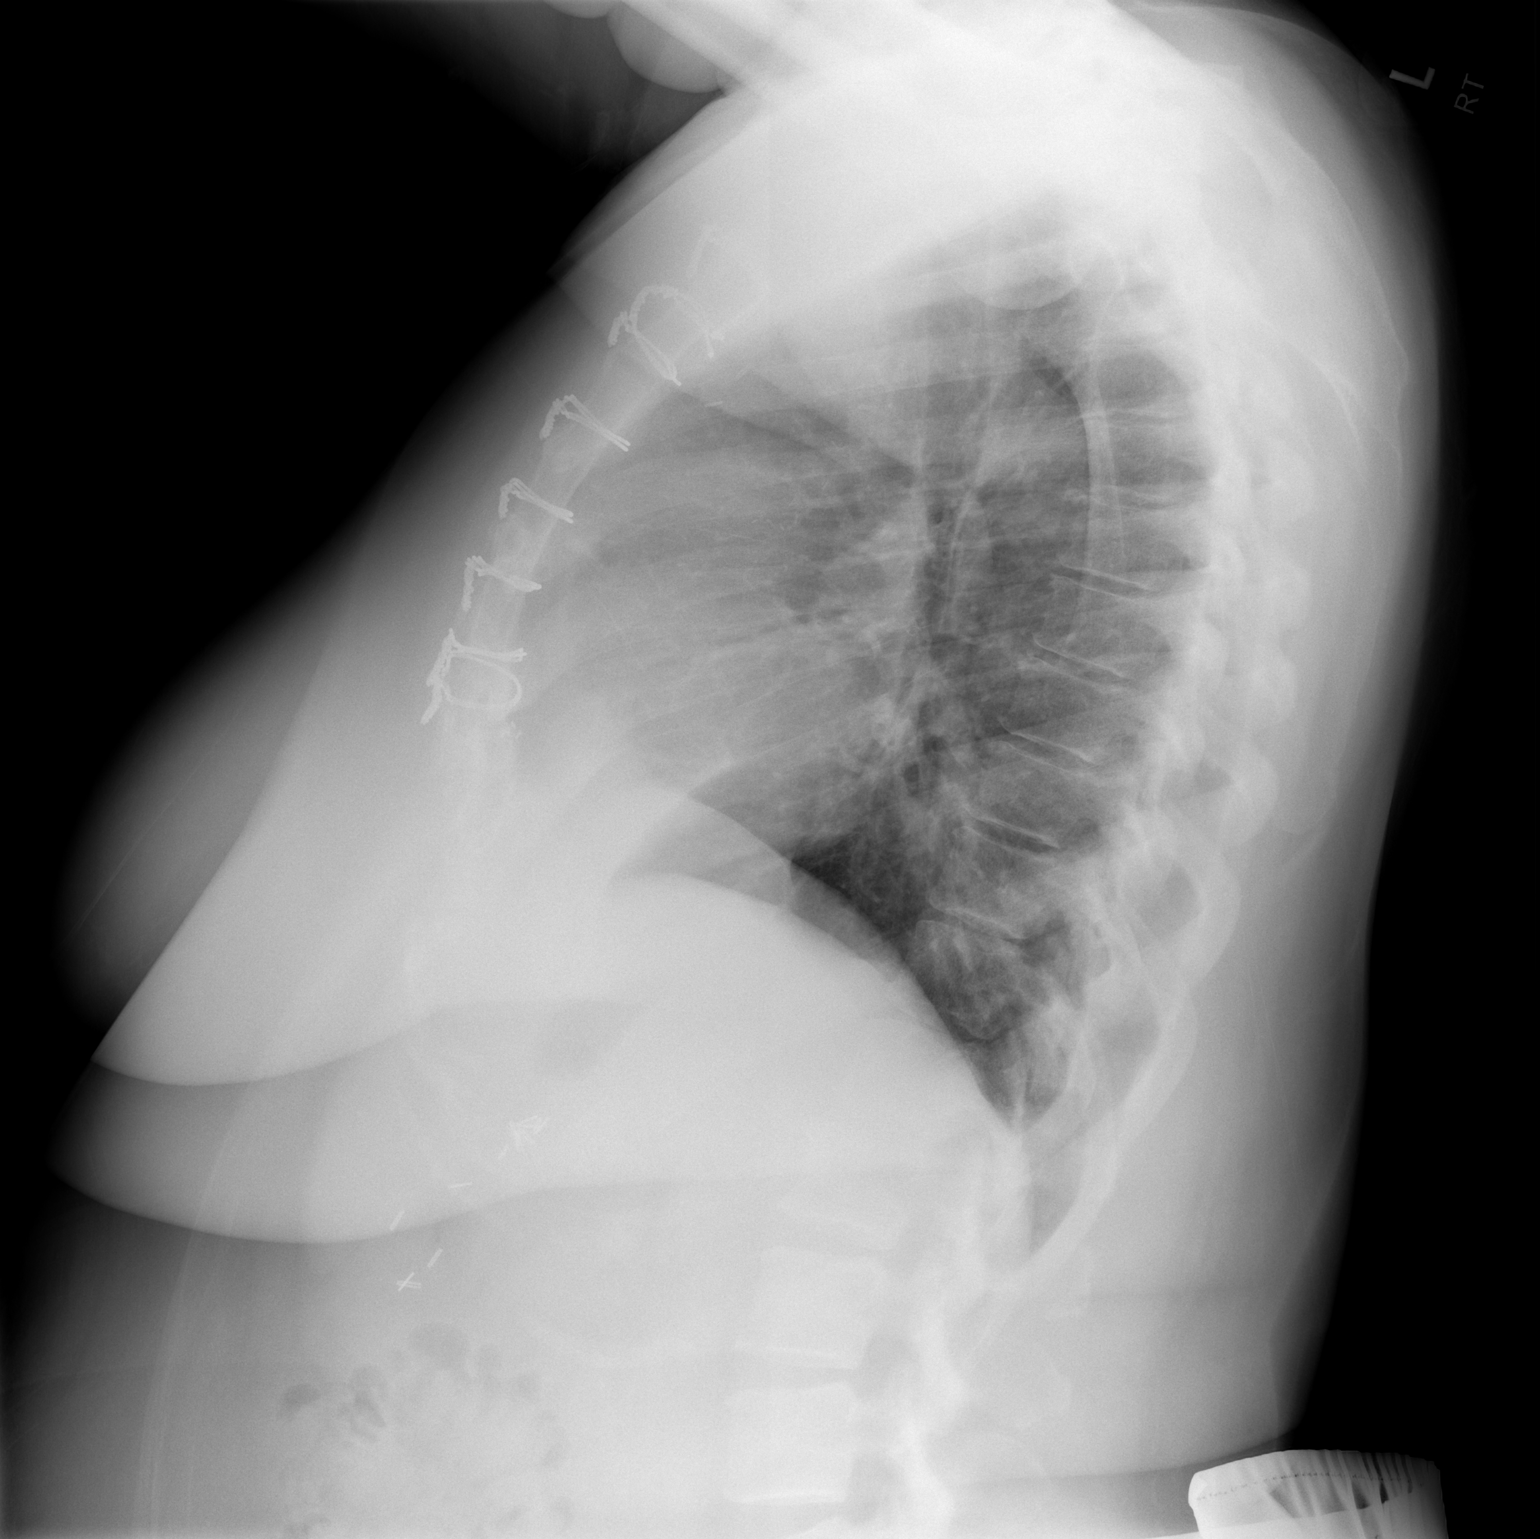

[2 of 2 positions shown; findings below may reference images not displayed]

FINDINGS: The lungs are adequately inflated and clear. Bibasilar atelectasis
has resolved. There is no pleural effusion or pneumothorax. The
heart is top-normal in size. The pulmonary vascularity is normal.
The sternal wires are intact.
IMPRESSION: There is no active cardiopulmonary disease.

## 2016-04-02 ENCOUNTER — Encounter: Payer: Managed Care, Other (non HMO) | Admitting: Thoracic Surgery (Cardiothoracic Vascular Surgery)

## 2016-04-09 ENCOUNTER — Ambulatory Visit (INDEPENDENT_AMBULATORY_CARE_PROVIDER_SITE_OTHER): Payer: Managed Care, Other (non HMO) | Admitting: Cardiology

## 2016-04-09 ENCOUNTER — Encounter: Payer: Managed Care, Other (non HMO) | Admitting: Thoracic Surgery (Cardiothoracic Vascular Surgery)

## 2016-04-09 ENCOUNTER — Encounter: Payer: Self-pay | Admitting: Cardiology

## 2016-04-09 VITALS — BP 119/69 | HR 59 | Ht 63.0 in | Wt 223.4 lb

## 2016-04-09 DIAGNOSIS — I2510 Atherosclerotic heart disease of native coronary artery without angina pectoris: Secondary | ICD-10-CM | POA: Diagnosis not present

## 2016-04-09 DIAGNOSIS — Z79899 Other long term (current) drug therapy: Secondary | ICD-10-CM | POA: Diagnosis not present

## 2016-04-09 DIAGNOSIS — E785 Hyperlipidemia, unspecified: Secondary | ICD-10-CM

## 2016-04-09 DIAGNOSIS — G4739 Other sleep apnea: Secondary | ICD-10-CM

## 2016-04-09 DIAGNOSIS — E669 Obesity, unspecified: Secondary | ICD-10-CM

## 2016-04-09 DIAGNOSIS — Z951 Presence of aortocoronary bypass graft: Secondary | ICD-10-CM

## 2016-04-09 DIAGNOSIS — G35 Multiple sclerosis: Secondary | ICD-10-CM | POA: Diagnosis not present

## 2016-04-09 DIAGNOSIS — R400 Somnolence: Secondary | ICD-10-CM

## 2016-04-09 NOTE — Assessment & Plan Note (Signed)
Followed at [facility] neurologic

## 2016-04-09 NOTE — Progress Notes (Signed)
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 She had an off pump CABG x 1 in in jan 2017 and has done well since. She is able to take Lipitor 80 mg and her LDL in May 2017 was 44. She tells me that in the last year she has had some changes at home with on child getting married and another graduating from [employer]. She has noted some depression. Her medications were increased but now she feels fatigue. She has gained wgt-20 lbs since her LOV. She snores at night and wakes up gasping at times.    Current Outpatient Prescriptions  Medication Sig Dispense Refill  . acetaminophen (TYLENOL) 500 MG tablet Take 1,000 mg by mouth every 6 (six) hours as needed for mild pain.    Marland Kitchen aspirin EC 325 MG EC tablet Take 1 tablet (325 mg total) by mouth daily. 30 tablet 0  . atorvastatin (LIPITOR) 80 MG tablet TAKE 1 TABLET BY MOUTH EVERY DAY AT 6PM 30 tablet 11  . baclofen (LIORESAL) 10 MG tablet Take 2 tablets (20 mg total) by mouth 3 (three) times daily. 180 tablet 11  . docusate sodium (COLACE) 100 MG capsule Take 100 mg by mouth 2 (two) times daily.    Marland Kitchen gabapentin (NEURONTIN) 300 MG capsule Take 2 capsules (600 mg total) by mouth 3 (three) times daily. 180 capsule 11  . lisinopril (PRINIVIL,ZESTRIL) 5 MG tablet TAKE 1 TABLET BY MOUTH EVERY DAY 30 tablet 11  . metFORMIN (GLUCOPHAGE-XR) 500 MG 24 hr tablet Take 500 mg by mouth every evening.    . metoprolol tartrate (LOPRESSOR) 25 MG tablet TAKE 1 TABLET BY MOUTH TWICE A DAY 60 tablet 11  . natalizumab (TYSABRI) 300 MG/15ML injection Inject 15 mLs (300 mg total) into the vein every 30 (thirty) days. 15 mL 11  . traZODone (DESYREL) 50 MG tablet Take 3 tablets (150 mg total) by mouth at bedtime. 90 tablet 11  . venlafaxine XR (EFFEXOR-XR) 75 MG 24 hr capsule Take 1 capsule (75 mg total) by mouth  daily with breakfast. 30 capsule 11   No current facility-administered medications for this visit.     No Known Allergies  Social History   Social History  . Marital status: Married    Spouse name: N/A  . Number of children: 4  . Years of education: college   Occupational History  . 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 Smoking status: Former Smoker    Packs/day: 1.50    Years: 15.00    Types: Cigarettes    Quit date: 02/03/2004  . Smokeless tobacco: Never Used  . Alcohol use 0.6 oz/week    1 Glasses of wine per week  . Drug use: No  . Sexual activity: Yes    Partners: Male    Birth control/ protection: Surgical     Comment: vasectomy   Other Topics Concern  . Not on file   Social History Narrative   Patient lives at home with her husband and children. Patient works for the [employer] in [place]. College education.      Review of Systems: General: negative for chills, fever, night sweats or weight changes.  Cardiovascular: negative for chest pain, dyspnea on exertion, edema, orthopnea, palpitations, paroxysmal nocturnal dyspnea or  shortness of breath Dermatological: negative for rash Respiratory: negative for cough or wheezing Urologic: negative for hematuria Abdominal: negative for nausea, vomiting, diarrhea, bright red blood per rectum, melena, or hematemesis Neurologic: negative for visual changes, syncope, or dizziness All other systems reviewed and are otherwise negative except as noted above.    Blood pressure 119/69, pulse (!) 59, height 5\' 3"  (1.6 m), weight 223 lb 6.4 oz (101.3 kg), SpO2 97 %.  General appearance: alert, cooperative, no distress and morbidly obese Neck: no carotid bruit and no JVD Lungs: clear to auscultation bilaterally Heart: regular rate and rhythm Extremities: extremities normal, atraumatic, no cyanosis or edema Skin: Skin color, texture, turgor normal. No rashes or lesions Neurologic: Grossly normal  EKG  NSR  ASSESSMENT AND PLAN:   S/P CABG x 1 LIMA to LAD off pump Jan 2017  Multiple sclerosis ([place]) Followed at [facility] neurologic  Obesity (BMI 30-39.9) BMI 39- I suspect she has sleep apnea  Dyslipidemia LDL was 44 May 2017-tolerating Lipitor 80 mg   PLAN  I suspect Ms briscoe has sleep apnea. I recommended a sleep study and she is agreeable. We discussed the importance of maintaining a reasonable wgt but I think she feels like this is not something she can control. I suggested she contact her PCP about adjusting her medications for depression to see if she has less fatigue, OSA may be contributing as well. F/U lipids and LFTs in 6 months then Dr Gwenlyn Found.  Kerin Ransom PA-C 04/09/2016 11:49 AM

## 2016-04-09 NOTE — Assessment & Plan Note (Addendum)
BMI 39- I suspect she has sleep apnea

## 2016-04-09 NOTE — Assessment & Plan Note (Signed)
LIMA to LAD off pump Jan 2017

## 2016-04-09 NOTE — Assessment & Plan Note (Signed)
LDL was 44 May 2017-tolerating Lipitor 80 mg

## 2016-04-09 NOTE — Patient Instructions (Addendum)
Kerin Ransom, [place], recommends that you continue on your current medications as directed. Please refer to the Current Medication list given to you today.  Your physician recommends that you return for lab work in 6 months - FASTING.  Your physician has recommended that you have a sleep study. This test records several body functions during sleep, including: brain activity, eye movement, oxygen and carbon dioxide blood levels, heart rate and rhythm, breathing rate and rhythm, the flow of air through your mouth and nose, snoring, body muscle movements, and chest and belly movement. This will be performed at [facility].  Lurena Joiner recommends that you schedule a follow-up appointment in 6 months with Dr Gwenlyn Found. You will receive a reminder letter in the mail two months in advance. If you don't receive a letter, please call our office to schedule the follow-up appointment.  If you need a refill on your cardiac medications before your next appointment, please call your pharmacy.

## 2016-04-26 ENCOUNTER — Telehealth: Payer: Self-pay | Admitting: Neurology

## 2016-04-26 NOTE — Telephone Encounter (Signed)
[facility] [phone] called. Tysabri will ship out Monday 2/5 and will be delivered 2/6 before noon, [employer]. Approved by Otila Kluver.  FYI

## 2016-05-07 ENCOUNTER — Encounter: Payer: Managed Care, Other (non HMO) | Admitting: Thoracic Surgery (Cardiothoracic Vascular Surgery)

## 2016-05-14 ENCOUNTER — Telehealth: Payer: Self-pay | Admitting: *Deleted

## 2016-05-14 ENCOUNTER — Ambulatory Visit: Payer: Managed Care, Other (non HMO) | Admitting: Neurology

## 2016-05-14 NOTE — Telephone Encounter (Signed)
No showed follow up appointment. 

## 2016-05-15 ENCOUNTER — Encounter: Payer: Self-pay | Admitting: Neurology

## 2016-05-21 ENCOUNTER — Encounter: Payer: Self-pay | Admitting: Thoracic Surgery (Cardiothoracic Vascular Surgery)

## 2016-05-21 ENCOUNTER — Ambulatory Visit (INDEPENDENT_AMBULATORY_CARE_PROVIDER_SITE_OTHER): Payer: Managed Care, Other (non HMO) | Admitting: Thoracic Surgery (Cardiothoracic Vascular Surgery)

## 2016-05-21 VITALS — BP 121/67 | HR 62 | Resp 20 | Ht 63.0 in | Wt 223.0 lb

## 2016-05-21 DIAGNOSIS — I2510 Atherosclerotic heart disease of native coronary artery without angina pectoris: Secondary | ICD-10-CM

## 2016-05-21 DIAGNOSIS — I214 Non-ST elevation (NSTEMI) myocardial infarction: Secondary | ICD-10-CM | POA: Diagnosis not present

## 2016-05-21 DIAGNOSIS — I249 Acute ischemic heart disease, unspecified: Secondary | ICD-10-CM

## 2016-05-21 DIAGNOSIS — Z951 Presence of aortocoronary bypass graft: Secondary | ICD-10-CM

## 2016-05-21 NOTE — Patient Instructions (Signed)
Continue all previous medications without any changes at this time  Make every effort to stay physically active, get some type of exercise on a regular basis, and stick to a "heart healthy diet".  The long term benefits for regular exercise and a healthy diet are critically important to your overall health and wellbeing.  Make every effort to keep your diabetes under very tight control.  Follow up closely with your primary care physician or endocrinologist and strive to keep their hemoglobin A1c levels as low as possible, preferably near or below 6.0.  The long term benefits of strict control of diabetes are far reaching and critically important for your overall health and survival.  

## 2016-05-21 NOTE — Progress Notes (Signed)
[address]       [place] [address]             [phone]     CARDIOTHORACIC SURGERY OFFICE NOTE  Referring Provider is Troy Sine, MD  Primary Cardiologist is BERRY, Pearletha Forge, MD PCP is Jonathon Bellows, MD   HPI:  Patient is a 51 year old obese white female with history of coronary artery disease, type 2 diabetes mellitus, and hyperlipidemia who returns to the office today for routine follow-up approximately one year status post off-pump coronary artery bypass grafting 1 on 04/07/2015 for severe single-vessel coronary artery disease status post acute non-ST segment elevation myocardial infarction. The patient's postoperative recovery was uneventful and she was last seen here in our office on 05/09/2015 at which time she was doing well.  Since then she has been followed up intermittently at [facility].  She was last seen by Kerin Ransom on 04/09/2016 at which time she was doing well.  She returns for office today and reports that she continues to do very well from a cardiac standpoint. She admits that she does not exercise on a regular basis and she never participated in outpatient cardiac rehabilitation program. She had some problems with depression and is now on antidepressant medications which she complains make her sleepy.  She denies any symptoms of exertional chest pain or chest tightness. She is doing fairly well at work although this has been reportedly stressful recently. She has been referred for an outpatient sleep study to evaluate the possibility of the presence of obstructive sleep apnea.   Current Outpatient Prescriptions  Medication Sig Dispense Refill  . acetaminophen (TYLENOL) 500 MG tablet Take 1,000 mg by mouth every 6 (six) hours as needed for mild pain.    Marland Kitchen aspirin EC 81 MG tablet Take 81 mg by mouth daily.    Marland Kitchen atorvastatin (LIPITOR) 80 MG tablet TAKE 1 TABLET BY MOUTH EVERY DAY AT 6PM 30 tablet 11  . baclofen (LIORESAL) 10 MG  tablet Take 2 tablets (20 mg total) by mouth 3 (three) times daily. 180 tablet 11  . docusate sodium (COLACE) 100 MG capsule Take 100 mg by mouth 2 (two) times daily.    Marland Kitchen gabapentin (NEURONTIN) 300 MG capsule Take 2 capsules (600 mg total) by mouth 3 (three) times daily. 180 capsule 11  . lisinopril (PRINIVIL,ZESTRIL) 5 MG tablet TAKE 1 TABLET BY MOUTH EVERY DAY 30 tablet 11  . metFORMIN (GLUCOPHAGE-XR) 500 MG 24 hr tablet Take 500 mg by mouth every evening.    . metoprolol tartrate (LOPRESSOR) 25 MG tablet TAKE 1 TABLET BY MOUTH TWICE A DAY 60 tablet 11  . natalizumab (TYSABRI) 300 MG/15ML injection Inject 15 mLs (300 mg total) into the vein every 30 (thirty) days. 15 mL 11  . traZODone (DESYREL) 50 MG tablet Take 3 tablets (150 mg total) by mouth at bedtime. 90 tablet 11  . venlafaxine XR (EFFEXOR-XR) 75 MG 24 hr capsule Take 1 capsule (75 mg total) by mouth daily with breakfast. 30 capsule 11   No current facility-administered medications for this visit.       Physical Exam:   BP 121/67   Pulse 62   Resp 20   Ht 5\' 3"  (1.6 m)   Wt 223 lb (101.2 kg)   SpO2 98%   BMI 39.50 kg/m   General:  Obese but well appearing  Chest:   Clear to auscultation  CV:   Regular rate  and rhythm without murmur  Incisions:  Completely healed, sternum is stable  Abdomen:  Soft nontender  Extremities:  Warm and well-perfused  Diagnostic Tests:  n/a    Impression:  Patient is doing well from a cardiac standpoint approximately one year status post coronary artery bypass grafting 1  Plan:  We have not recommended any changes to the patient's current medications. She has been encouraged to find a way to make some degree of regular exercise part of her daily habits. The numerous benefits of exercise, heart healthy diet, and weight loss have been discussed at length. All of her questions have been addressed. In the future she will call and return to see [place] only should further problems or questions  arise.  I spent in excess of 15 minutes during the conduct of this office consultation and >50% of this time involved direct face-to-face encounter with the patient for counseling and/or coordination of their care.    Valentina Gu. Roxy Manns, MD 05/21/2016 4:09 PM

## 2016-06-14 ENCOUNTER — Encounter (HOSPITAL_BASED_OUTPATIENT_CLINIC_OR_DEPARTMENT_OTHER): Payer: Managed Care, Other (non HMO)

## 2016-08-09 ENCOUNTER — Other Ambulatory Visit: Payer: Self-pay | Admitting: Cardiovascular Disease

## 2016-08-22 ENCOUNTER — Other Ambulatory Visit: Payer: Self-pay | Admitting: Neurology

## 2016-08-24 ENCOUNTER — Other Ambulatory Visit: Payer: Self-pay | Admitting: Neurology

## 2016-10-16 ENCOUNTER — Encounter: Payer: Self-pay | Admitting: Cardiovascular Disease

## 2016-10-17 ENCOUNTER — Ambulatory Visit: Payer: Managed Care, Other (non HMO) | Admitting: Cardiovascular Disease

## 2016-11-13 ENCOUNTER — Encounter: Payer: Self-pay | Admitting: Cardiovascular Disease

## 2016-11-13 ENCOUNTER — Ambulatory Visit (INDEPENDENT_AMBULATORY_CARE_PROVIDER_SITE_OTHER): Payer: Managed Care, Other (non HMO) | Admitting: Cardiovascular Disease

## 2016-11-13 VITALS — BP 130/88 | HR 56 | Ht 63.0 in | Wt 236.8 lb

## 2016-11-13 DIAGNOSIS — E785 Hyperlipidemia, unspecified: Secondary | ICD-10-CM | POA: Diagnosis not present

## 2016-11-13 DIAGNOSIS — Z951 Presence of aortocoronary bypass graft: Secondary | ICD-10-CM | POA: Diagnosis not present

## 2016-11-13 DIAGNOSIS — I214 Non-ST elevation (NSTEMI) myocardial infarction: Secondary | ICD-10-CM | POA: Diagnosis not present

## 2016-11-13 NOTE — Assessment & Plan Note (Signed)
History of dyslipidemia on statin therapy.  We will recheck a lipid and liver profile. 

## 2016-11-13 NOTE — Patient Instructions (Signed)

## 2016-11-13 NOTE — Progress Notes (Signed)
11/13/2016 Keeshia A Windhorst   02-05-1966  [id number]  Primary Physician Maurice Small, MD Primary Cardiologist: Lorretta Harp MD Renae Gloss  HPI:  Kaylee Bryant is a 51 y.o. 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 I last saw her in the office 07/26/15. She has a history of multiple sclerosis the past as well as diabetes and remote tobacco abuse having quit in 2005. She had a non-STEMI on 04/05/15 with unstable angina. She is catheterized by Dr. Claiborne Billings revealing a 95% ostial LAD lesion and 2 days later underwent off-pump LIMA placement by Dr. Roxy Manns. She participated in cardiac rehabilitation briefly. She is currently symptomatically back to work. She denies chest pain or shortness of breath.    Current Meds  Medication Sig  . acetaminophen (TYLENOL) 500 MG tablet Take 1,000 mg by mouth every 6 (six) hours as needed for mild pain.  Marland Kitchen aspirin EC 81 MG tablet Take 81 mg by mouth daily.  Marland Kitchen atorvastatin (LIPITOR) 80 MG tablet TAKE 1 TABLET BY MOUTH EVERY DAY AT 6PM  . baclofen (LIORESAL) 10 MG tablet Take 2 tablets (20 mg total) by mouth 3 (three) times daily.  Marland Kitchen docusate sodium (COLACE) 100 MG capsule Take 100 mg by mouth 2 (two) times daily.  Marland Kitchen gabapentin (NEURONTIN) 300 MG capsule TAKE 2 CAPSULES (600 MG TOTAL) BY MOUTH 3 (THREE) TIMES DAILY.  Marland Kitchen lisinopril (PRINIVIL,ZESTRIL) 5 MG tablet TAKE 1 TABLET BY MOUTH EVERY DAY  . metFORMIN (GLUCOPHAGE-XR) 500 MG 24 hr tablet Take 500 mg by mouth every evening.  . metoprolol tartrate (LOPRESSOR) 25 MG tablet TAKE 1 TABLET BY MOUTH TWICE A DAY  . natalizumab (TYSABRI) 300 MG/15ML injection Inject 15 mLs (300 mg total) into the vein every 30 (thirty) days.  . traZODone (DESYREL) 50 MG tablet Take 3 tablets (150 mg total) by mouth at bedtime.  Marland Kitchen venlafaxine XR (EFFEXOR-XR) 75 MG 24 hr capsule Take 1 capsule (75 mg total) by mouth daily with breakfast.     No Known  Allergies  Social History   Social History  . Marital status: Married    Spouse name: N/A  . Number of children: 4  . Years of education: college   Occupational History  . RN [place] History Main Topics  . Smoking status: Former Smoker    Packs/day: 1.50    Years: 15.00    Types: Cigarettes    Quit date: 02/03/2004  . Smokeless tobacco: Never Used  . Alcohol use 0.6 oz/week    1 Glasses of wine per week  . Drug use: No  . Sexual activity: Yes    Partners: Male    Birth control/ protection: Surgical     Comment: vasectomy   Other Topics Concern  . Not on file   Social History Narrative   Patient lives at home with her husband and children. Patient works for the [employer] in [place]. College education.      Review of Systems: General: negative for chills, fever, night sweats or weight changes.  Cardiovascular: negative for chest pain, dyspnea on exertion, edema, orthopnea, palpitations, paroxysmal nocturnal dyspnea or shortness of breath Dermatological: negative for rash Respiratory: negative for cough or wheezing Urologic: negative for hematuria Abdominal: negative for nausea, vomiting, diarrhea, bright red blood per rectum, melena, or hematemesis Neurologic: negative for visual changes, syncope, or dizziness All other systems reviewed and are  otherwise negative except as noted above.    Blood pressure 130/88, pulse (!) 56, height 5\' 3"  (1.6 m), weight 236 lb 12.8 oz (107.4 kg).  General appearance: alert and no distress Neck: no adenopathy, no carotid bruit, no JVD, supple, symmetrical, trachea midline and thyroid not enlarged, symmetric, no tenderness/mass/nodules Lungs: clear to auscultation bilaterally Heart: regular rate and rhythm, S1, S2 normal, no murmur, click, rub or gallop Extremities: extremities normal, atraumatic, no cyanosis or edema  EKG sinus bradycardia 56 without ST or T-wave changes. I personally reviewed this  EKG  ASSESSMENT AND PLAN:   S/P CABG x 1 History of non-STEMI with cardiac catheterization performed by Dr. Claiborne Billings 04/05/15 revealing a 95% ostial LAD lesion. 2 days later, she underwent off-pump LIMA to the LAD by Dr. Roxy Manns which was uncomplicated. She's been a symptomatic since.  Dyslipidemia History of dyslipidemia on statin therapy. We will recheck a lipid and liver profile      Lorretta Harp MD [facility], [facility] 11/13/2016 2:43 PM

## 2016-11-13 NOTE — Assessment & Plan Note (Signed)
History of non-STEMI with cardiac catheterization performed by Dr. Claiborne Billings 04/05/15 revealing a 95% ostial LAD lesion. 2 days later, she underwent off-pump LIMA to the LAD by Dr. Roxy Manns which was uncomplicated. She's been a symptomatic since.

## 2016-12-13 ENCOUNTER — Encounter: Payer: Self-pay | Admitting: Neurology

## 2016-12-20 ENCOUNTER — Telehealth: Payer: Self-pay | Admitting: *Deleted

## 2016-12-20 NOTE — Telephone Encounter (Signed)
Labs collected 12/13/16:  JCV 0.13 negative

## 2016-12-30 ENCOUNTER — Other Ambulatory Visit: Payer: Self-pay | Admitting: Neurology

## 2017-01-09 ENCOUNTER — Encounter: Payer: Self-pay | Admitting: Neurology

## 2017-01-09 ENCOUNTER — Other Ambulatory Visit: Payer: Self-pay | Admitting: *Deleted

## 2017-01-09 MED ORDER — BACLOFEN 10 MG PO TABS: 20.0000 mg | ORAL_TABLET | Freq: Three times a day (TID) | ORAL | 5 refills | Status: DC

## 2017-02-08 ENCOUNTER — Encounter: Payer: Self-pay | Admitting: Neurology

## 2017-02-11 ENCOUNTER — Telehealth: Payer: Self-pay | Admitting: Neurology

## 2017-02-11 ENCOUNTER — Encounter: Payer: Self-pay | Admitting: Neurology

## 2017-02-11 NOTE — Telephone Encounter (Signed)
Spoke to patient - she will bring her new insurance information to her next infusion appt.  Once Tysabri is denied, she should be eligible for the free drug program.  I will provide this information to [facility] in [facility].

## 2017-02-11 NOTE — Telephone Encounter (Signed)
-----   Message -----  From: Kallie Edward  Sent: 02/08/2017  6:51 PM  To: [facility] Clinical [facility]  Subject: Non-Urgent Medical Question             ----- Message from [place], Generic sent at 02/08/2017 6:51 PM EST -----    Open enrollment for my insurance does not include tysabri. Absolutely none of the plans. Are there any other medications I can be on?

## 2017-03-02 ENCOUNTER — Other Ambulatory Visit: Payer: Self-pay | Admitting: Neurology

## 2017-03-12 ENCOUNTER — Other Ambulatory Visit: Payer: Self-pay | Admitting: Orthopedic Surgery

## 2017-03-12 DIAGNOSIS — M545 Low back pain: Secondary | ICD-10-CM

## 2017-04-08 ENCOUNTER — Ambulatory Visit (INDEPENDENT_AMBULATORY_CARE_PROVIDER_SITE_OTHER): Payer: 59 | Admitting: Neurology

## 2017-04-08 ENCOUNTER — Encounter: Payer: Self-pay | Admitting: Neurology

## 2017-04-08 VITALS — BP 136/79 | HR 62 | Ht 63.0 in | Wt 224.0 lb

## 2017-04-08 DIAGNOSIS — R269 Unspecified abnormalities of gait and mobility: Secondary | ICD-10-CM

## 2017-04-08 DIAGNOSIS — G35 Multiple sclerosis: Secondary | ICD-10-CM

## 2017-04-08 NOTE — Progress Notes (Signed)
Chief Complaint  Patient presents with  . Multiple Sclerosis     [facility]  PATIENT: Kaylee Bryant DOB: 08-17-1965   REASON FOR VISIT: follow up for MS, relapsing remitting, fatigue abnormality of gait, back pain HISTORY FROM:patient    HISTORY OF PRESENT ILLNESS: HISTORY: She was diagnosed with RRMS in 2007, 6 month post partum, she had numbness from waist done, diagnosis was confirmed by marked abnormal MRI brain, in March,2007. There were multiple T2/Flair periventricular lesions, corpus callosum involvement, mutiple ring enhancing lesions. Also had enhancing lesion in cervical and thoracic spines. She was treated at [facility] by Dr. Dellis Filbert, no csf study, but had extensive lab evaluations to rule out mimics. 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 She denies visual changes or other clear relapsing episodes.  I have reviewed report MRI brain by Dr. Brandon Melnick in 01/2007, multiple T2/FLAIR lesions in both hemisphere with corpus callosum involvement, there are slight increase in lesion load. MRI cervical, stable lesions in C6-7, and C2-3. Thoracic, multiple non enhancing lesions.  She tolerated Rebif well, no flu like illness, but complains of bilateral arm, and the lateral thigh lipoatrophy with repeat Rebif injection. There was no clear relapsed over the years, but she has slow worsening gait difficulty over the past 6 months.  MRI in Sept 2012, MRI of thoracic spine Multiple chronic plaques at C7, T6-7, T8-9, T10.  MRI of cervical spine ,Multiple chronic demyelinating plaques at C3, C4 and C7. MRI of brain Multiple round and ovioid, periventricular and subcortical chronic demyelinating plaques. 2 plaques noted in the pons. No abnormal lesions are  seen on post contrast views.  Her JC-virus antibody was negative. After discuss, we started her on Tysarbri infusion since February of 2013. She tolerated the infusion very well, but complains of excessive fatigue over the past few months, she still able to work full-time as a Marine scientist, she complains of difficulty dragging herself out of bed. Modafinil was not approved by insurance.She was put on Ritalin 10mg  tid, which helped her fatigue. feels much better on Tysabri.  JC-virus antibody negative (July 2014), Negative 0.17 in 11/11/2013,   UPDATE December 16 2013: She is doing very well, her gait has improved, tolerating [name], Ritalin 10 mg tid. for her fatigue.JC-virus antibody negative 11/11/2013 .  Most recent MRI of the brain 10/21/2013 with multiple supra tentorial and infra-tentorial chronic demyelinating plaques. No acute plaques. No change from MRI 05/22/2013. She returns for reevaluation  UPDATE April 4th 2016: She works full time as Therapist, sports, s he has more dizziness spells, especially when she has to stand up, she has occasional headaches, She complains of intermittent sharp pain at her right lower thoracic region, X-ray showed DJD, excerbated by walking, Orthopedic evaluation was normal, including bilateral hip,  She is on Tysarbri,taking baclofen 10mg  tid, works well for her lower extremity She has no incotninence, if she stopped,   UPDATE July 26th 2016:She is now complainng of right side low back pain, radiating to right hip, right hip muscle cramping, sometimes difficulty getting out of the bed, to put on her clothes, 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 She now noticed more fatigue, depression, also noticed mild worsening unsteady gait  I reviewed laboratory evaluations, ferritin 54, vitamin Y09, folic acid, iron level was normal, LDL 44, cholesterol 96, A1c 6.4, normal BMP, UA,  UPDATE Apr 08 2017: She has been receiving Tysarbri infusion, last one was in Jan 2019, she is overall doing very well, still has baseline mild gait abnormality, fatigue,  REVIEW OF SYSTEMS: Full 14 system review of systems performed and notable only for those listed, all others are neg:  As above  ALLERGIES: No Known Allergies  HOME MEDICATIONS: Outpatient Medications Prior to Visit  Medication Sig Dispense Refill  . acetaminophen (TYLENOL) 500 MG tablet Take 1,000 mg by mouth every 6 (six) hours as needed for mild pain.    Marland Kitchen aspirin EC 81 MG tablet Take 81 mg by mouth daily.    Marland Kitchen atorvastatin (LIPITOR) 80 MG tablet TAKE 1 TABLET BY MOUTH EVERY DAY AT 6PM 30 tablet 8  . baclofen (LIORESAL) 10 MG tablet Take 2 tablets (20 mg total) by mouth 3 (three) times daily. 180 tablet 5  . docusate sodium (COLACE) 100 MG capsule Take 100 mg by mouth 2 (two) times daily.    Marland Kitchen gabapentin (NEURONTIN) 300 MG capsule TAKE 2 CAPSULES (600 MG TOTAL) BY MOUTH 3 (THREE) TIMES DAILY. 180 capsule 8  . lisinopril (PRINIVIL,ZESTRIL) 5 MG tablet TAKE 1 TABLET BY MOUTH EVERY DAY 30 tablet 8  . metFORMIN (GLUCOPHAGE-XR) 500 MG 24 hr tablet Take 1,000 mg by mouth 2 (two) times daily.     . metoprolol tartrate (LOPRESSOR) 25 MG tablet TAKE 1 TABLET BY MOUTH TWICE A DAY 60 tablet 8  . natalizumab (TYSABRI) 300 MG/15ML injection Inject 15 mLs (300 mg total) into the vein every 30 (thirty) days. 15 mL 11    . venlafaxine XR (EFFEXOR-XR) 75 MG 24 hr capsule TAKE 1 CAPSULE (75 MG TOTAL) BY MOUTH DAILY WITH BREAKFAST. 30 capsule 11  . traZODone (DESYREL) 50 MG tablet TAKE 3 TABLETS (150 MG TOTAL) BY MOUTH AT BEDTIME. 90 tablet 11   No facility-administered medications prior to visit.     PAST MEDICAL HISTORY: Past Medical History:  Diagnosis Date  . 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 Coronary artery disease involving native coronary artery with unstable angina pectoris ([place]) 04/05/2015  . DDD (degenerative disc disease), lumbar   . DM (diabetes mellitus), gestational 2006   insulin at the time  . Fatigue   . Heart attack ([place])   . IBS (irritable bowel syndrome)    with chronic constipation  . Multiple sclerosis ([place])   . Obesity   . 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 CARDIAC CATHETERIZATION N/A 04/05/2015   Procedure: Left Heart Cath and Coronary Angiography;  Surgeon: Troy Sine, MD;  Location: [facility];  Service: Cardiovascular;  Laterality: N/A;  .  CERVICAL CONE BIOPSY  2004  . [place], 2006  . CHOLECYSTECTOMY     1988  . COLPOSCOPY  2004  . CORONARY ARTERY BYPASS GRAFT N/A 04/07/2015   Procedure: CORONARY ARTERY BYPASS GRAFTING (CABG), OFF PUMP, TIMES ONE, USING LEFT INTERNAL MAMMARY ARTERY;  Surgeon: Rexene Alberts, MD;  Location: [facility];  Service: Open Heart Surgery;  Laterality: N/A;  LIMA to LAD  . TEE WITHOUT CARDIOVERSION N/A 04/07/2015   Procedure: TRANSESOPHAGEAL ECHOCARDIOGRAM (TEE);  Surgeon: Rexene Alberts, MD;  Location: [facility];  Service: Open Heart Surgery;  Laterality: N/A;    FAMILY HISTORY: Family History  Problem Relation Age of Onset  . Diabetes Mother   . Hypertension Mother   . Hypertension Father   . Atrial fibrillation Father   . Diabetes Sister   . Breast cancer Maternal Aunt   . Arrhythmia Sister   . Heart attack  Maternal Grandfather   . Stroke Neg Hx     SOCIAL HISTORY: Social History   Socioeconomic History  . Marital status: Married    Spouse name: Not on file  . Number of children: 4  . Years of education: college  . Highest education level: Not on file  Social Needs  . Financial resource strain: Not on file  . Food insecurity - worry: Not on file  . Food insecurity - inability: Not on file  . Transportation needs - medical: Not on file  . Transportation needs - non-medical: Not on file  Occupational History  . Occupation: Programmer, multimedia: [employer]  Tobacco Use  . Smoking status: Former Smoker    Packs/day: 1.50    Years: 15.00    Pack years: 22.50    Types: Cigarettes    Last attempt to quit: 02/03/2004    Years since quitting: 13.1  . Smokeless tobacco: Never Used  Substance and Sexual Activity  . Alcohol use: Yes    Alcohol/week: 0.6 oz    Types: 1 Glasses of wine per week  . Drug use: No  . Sexual activity: Yes    Partners: Male    Birth control/protection: Surgical    Comment: vasectomy  Other Topics Concern  . Not on file  Social History Narrative   Patient lives at home with her husband and children. Patient works for the [employer] in [place]. College education.      PHYSICAL EXAM  Vitals:   04/08/17 1624  BP: 136/79  Pulse: 62  Weight: 224 lb (101.6 kg)  Height: 5\' 3"  (1.6 m)   Body mass index is 39.68 kg/m.  Generalized: Well developed, obese female in no acute distress  Head: normocephalic and atraumatic,. Oropharynx benign  Neck: Supple, no carotid bruits  Cardiac: Regular rate rhythm, no murmur  Musculoskeletal: No deformity   Neurological examination   Mentation: Alert oriented to time, place, history taking. Attention span and concentration appropriate. Recent and remote memory intact.  Follows all commands speech and language fluent.   Cranial nerve II-XII: Fundoscopic exam reveals sharp disc margins.Pupils were equal round  reactive to light extraocular movements were full, visual field were full on confrontational test. Facial sensation and strength were normal. hearing was intact to finger rubbing bilaterally. Uvula tongue midline. head turning and shoulder shrug were normal and symmetric.Tongue protrusion into cheek strength was normal. Motor: She has mild spasticity of bilateral lower extremities, mild bilateral hip flexion weakness Sensory: normal and symmetric to light  touch, pinprick, and  Vibration, proprioception in upper and lower extremities Coordination: finger-nose-finger, heel-to-shin bilaterally, no dysmetria Reflexes: Brachioradialis 2/2, biceps 2/2, triceps 2/2, patellar 2/2, Achilles 2/2, plantar responses were flexor bilaterally. Gait and Station: Getting up from seated position pushed on chair arm, cautious, mildly unsteady  DIAGNOSTIC DATA (LABS, IMAGING, TESTING) - I reviewed patient records, labs, notes, testing and imaging myself where available.  Lab Results  Component Value Date   WBC 10.7 11/30/2015   HGB 13.9 11/30/2015   HCT 40.9 11/30/2015   MCV 89 11/30/2015   PLT 294 11/30/2015      Component Value Date/Time   NA 143 11/30/2015 1626   K 4.7 11/30/2015 1626   CL 100 11/30/2015 1626   CO2 29 11/30/2015 1626   GLUCOSE 96 11/30/2015 1626   GLUCOSE 145 (H) 04/11/2015 0537   BUN 17 11/30/2015 1626   CREATININE 0.74 11/30/2015 1626   CREATININE 0.65 06/02/2013 1640   CALCIUM 9.6 11/30/2015 1626   PROT 6.7 08/09/2015 0854   PROT 6.9 01/18/2015 0000   ALBUMIN 4.1 08/09/2015 0854   ALBUMIN 4.3 01/18/2015 0000   AST 25 08/09/2015 0854   ALT 25 08/09/2015 0854   ALKPHOS 66 08/09/2015 0854   BILITOT 0.5 08/09/2015 0854   BILITOT 0.4 01/18/2015 0000   GFRNONAA 95 11/30/2015 1626   GFRAA 110 11/30/2015 1626   Tysarbri infusion since Feb 2013 JC-virus antibody negative (July 2014), Negative 0.17 in 11/11/2013, 07/07/2014 JCV 0.19 negative. -0.13 in September 2018  ASSESSMENT  AND PLAN  52 y.o. year old female   Relapsing remitting multiple sclerosis  Continue with Tysarbri infusion  Repeat MRI of the brain with and without contrast  Restless leg syndrome  She has the urge to move her legs at evening and nighttime,  Continue gabapentin 300 mg 2 tablets 3 times a day  Depression:  Continue Effexor ER 75 mg every day  Marcial Pacas, M.D. Ph.D.  [facility] [place], [place] [address] Phone: [phone] Fax:      [phone]

## 2017-04-22 ENCOUNTER — Ambulatory Visit
Admission: RE | Admit: 2017-04-22 | Discharge: 2017-04-22 | Disposition: A | Discharge status: Home / Self Care | Payer: Managed Care, Other (non HMO) | Source: Ambulatory Visit | Attending: Neurology | Admitting: Neurology

## 2017-04-22 DIAGNOSIS — G35 Multiple sclerosis: Secondary | ICD-10-CM | POA: Diagnosis not present

## 2017-04-22 MED ORDER — GADOBENATE DIMEGLUMINE 529 MG/ML IV SOLN
20.0000 mL | Freq: Once | INTRAVENOUS | Status: AC | PRN
  Administered 2017-04-22: 20 mL via INTRAVENOUS

## 2017-04-24 ENCOUNTER — Other Ambulatory Visit: Payer: Managed Care, Other (non HMO)

## 2017-04-30 DIAGNOSIS — L570 Actinic keratosis: Secondary | ICD-10-CM | POA: Diagnosis not present

## 2017-04-30 DIAGNOSIS — L821 Other seborrheic keratosis: Secondary | ICD-10-CM | POA: Diagnosis not present

## 2017-05-14 DIAGNOSIS — G35 Multiple sclerosis: Secondary | ICD-10-CM | POA: Diagnosis not present

## 2017-05-24 ENCOUNTER — Ambulatory Visit
Admission: RE | Admit: 2017-05-24 | Discharge: 2017-05-24 | Disposition: A | Discharge status: Home / Self Care | Payer: BLUE CROSS/BLUE SHIELD | Source: Ambulatory Visit | Attending: Orthopedic Surgery | Admitting: Orthopedic Surgery

## 2017-05-24 ENCOUNTER — Encounter: Payer: Self-pay | Admitting: Radiology

## 2017-05-24 DIAGNOSIS — M545 Low back pain: Secondary | ICD-10-CM

## 2017-05-24 DIAGNOSIS — M5137 Other intervertebral disc degeneration, lumbosacral region: Secondary | ICD-10-CM | POA: Diagnosis not present

## 2017-05-24 MED ORDER — IOPAMIDOL (ISOVUE-M 200) INJECTION 41%
15.0000 mL | Freq: Once | INTRAMUSCULAR | Status: AC
  Administered 2017-05-24: 15 mL via INTRATHECAL

## 2017-05-24 MED ORDER — DIAZEPAM 5 MG PO TABS
10.0000 mg | ORAL_TABLET | Freq: Once | ORAL | Status: AC
  Administered 2017-05-24: 10 mg via ORAL

## 2017-05-24 MED ORDER — ONDANSETRON HCL 4 MG/2ML IJ SOLN: 4.0000 mg | Freq: Four times a day (QID) | INTRAMUSCULAR | Status: DC | PRN

## 2017-05-24 NOTE — Progress Notes (Signed)
Patient states she has been off Effexor and Trazodone for at least the past two days.

## 2017-05-24 NOTE — Discharge Instructions (Signed)
Myelogram Discharge Instructions  1. Go home and rest quietly for the next 24 hours.  It is important to lie flat for the next 24 hours.  Get up only to go to the restroom.  You may lie in the bed or on a couch on your back, your stomach, your left side or your right side.  You may have one pillow under your head.  You may have pillows between your knees while you are on your side or under your knees while you are on your back.  2. DO NOT drive today.  Recline the seat as far back as it will go, while still wearing your seat belt, on the way home.  3. You may get up to go to the bathroom as needed.  You may sit up for 10 minutes to eat.  You may resume your normal diet and medications unless otherwise indicated.  Drink lots of extra fluids today and tomorrow.  4. The incidence of headache, nausea, or vomiting is about 5% (one in 20 patients).  If you develop a headache, lie flat and drink plenty of fluids until the headache goes away.  Caffeinated beverages may be helpful.  If you develop severe nausea and vomiting or a headache that does not go away with flat bed rest, call [phone].  5. You may resume normal activities after your 24 hours of bed rest is over; however, do not exert yourself strongly or do any heavy lifting tomorrow. If when you get up you have a headache when standing, go back to bed and force fluids for another 24 hours.  6. Call your physician for a follow-up appointment.  The results of your myelogram will be sent directly to your physician by the following day.  7. If you have any questions or if complications develop after you arrive home, please call [phone].  Discharge instructions have been explained to the patient.  The patient, or the person responsible for the patient, fully understands these instructions.  YOU MAY RESTART YOUR TRAZODONE AND EFFEXOR TOMORROW 05/25/2017 AT 9:30AM.

## 2017-05-29 ENCOUNTER — Encounter: Payer: Self-pay | Admitting: Neurology

## 2017-05-30 ENCOUNTER — Telehealth: Payer: Self-pay

## 2017-05-30 DIAGNOSIS — M5136 Other intervertebral disc degeneration, lumbar region: Secondary | ICD-10-CM | POA: Diagnosis not present

## 2017-05-30 DIAGNOSIS — G35 Multiple sclerosis: Secondary | ICD-10-CM | POA: Diagnosis not present

## 2017-05-30 NOTE — Telephone Encounter (Signed)
I called pt to discuss her recent mychart message, per Dr. Krista Blue request. No answer, left a message asking her to call me back.

## 2017-05-30 NOTE — Telephone Encounter (Signed)
I called pt. She reports that she is taking her gabapentin as prescribed and it helps with her restless legs.  Pt is not experiencing any pain in her jaw. She is having her cheek and jaw area twitch. It is noticeable by others. It is only on the right side. She is wondering if medications can control the twitching. She has to cover her cheek with her hand to keep others from noticing when these things happen.

## 2017-05-30 NOTE — Telephone Encounter (Signed)
Pt has returned the call to RN Kristen, she is asking for a call back °

## 2017-05-30 NOTE — Telephone Encounter (Signed)
Please call patient, getting more detailed information about her right jaw pain, from email, suggestive of right trigeminal neuralgia,  Ask her how much gabapentin history taking, is gabapentin helpful,  The options are higher dose of gabapentin, Lyrica, versus other medication choices.

## 2017-05-30 NOTE — Telephone Encounter (Signed)
I saw the patient during her IV Tysabri infusion today, she has 2 weeks history of constant right masseter muscle fasciculations, not painful,  I have advised her to increase gabapentin up to 4 tablets at one time before bedtime, to see if that will help eliminate the fasciculations,

## 2017-06-08 ENCOUNTER — Other Ambulatory Visit: Payer: Self-pay | Admitting: Cardiovascular Disease

## 2017-06-10 NOTE — Telephone Encounter (Signed)
REFILL 

## 2017-06-12 ENCOUNTER — Encounter: Payer: Self-pay | Admitting: Neurology

## 2017-06-25 DIAGNOSIS — E119 Type 2 diabetes mellitus without complications: Secondary | ICD-10-CM | POA: Diagnosis not present

## 2017-07-02 ENCOUNTER — Telehealth: Payer: Self-pay | Admitting: Neurology

## 2017-07-02 NOTE — Telephone Encounter (Signed)
Jared/[employer] [phone] called needing to know if there was ever a PA submitted for tysabri. He states he has been conflicting information between [facility] and [employer] for the past 2 hours. He is aware the patient has started tysabri infusion. Please call to advise

## 2017-07-02 NOTE — Telephone Encounter (Signed)
This message has been provided to [facility] to check the status of her prior authorization.

## 2017-07-04 DIAGNOSIS — G35 Multiple sclerosis: Secondary | ICD-10-CM | POA: Diagnosis not present

## 2017-07-09 ENCOUNTER — Telehealth: Payer: Self-pay | Admitting: *Deleted

## 2017-07-09 ENCOUNTER — Encounter: Payer: Self-pay | Admitting: Neurology

## 2017-07-09 NOTE — Telephone Encounter (Signed)
Message from [place], Generic sent at 07/09/2017 8:44 AM EDT -----    The problem with my jaw is worse. Since the weekend I'm having problems with drinking and completing a sentence. My tremors are causing me to stop in mid sentence because my jaw will seize up. I begin to tremble when I drink.   Per Dr. Krista Blue, see if we can work-in for injections with Xeomin 100 units.

## 2017-07-10 ENCOUNTER — Encounter: Payer: Self-pay | Admitting: Neurology

## 2017-07-10 ENCOUNTER — Ambulatory Visit: Payer: Self-pay | Admitting: Neurology

## 2017-07-10 ENCOUNTER — Telehealth: Payer: Self-pay | Admitting: Neurology

## 2017-07-10 ENCOUNTER — Encounter: Payer: Self-pay | Admitting: *Deleted

## 2017-07-10 ENCOUNTER — Ambulatory Visit (INDEPENDENT_AMBULATORY_CARE_PROVIDER_SITE_OTHER): Payer: 59 | Admitting: Neurology

## 2017-07-10 VITALS — BP 128/78 | HR 61 | Ht 63.0 in | Wt 225.5 lb

## 2017-07-10 DIAGNOSIS — G248 Other dystonia: Secondary | ICD-10-CM | POA: Insufficient documentation

## 2017-07-10 DIAGNOSIS — G249 Dystonia, unspecified: Secondary | ICD-10-CM | POA: Diagnosis not present

## 2017-07-10 DIAGNOSIS — G518 Other disorders of facial nerve: Secondary | ICD-10-CM | POA: Insufficient documentation

## 2017-07-10 DIAGNOSIS — R269 Unspecified abnormalities of gait and mobility: Secondary | ICD-10-CM | POA: Insufficient documentation

## 2017-07-10 NOTE — Telephone Encounter (Signed)
Cancel her appt with Hoyle Sauer

## 2017-07-10 NOTE — Telephone Encounter (Signed)
Noted, thank you

## 2017-07-10 NOTE — Progress Notes (Signed)
Chief Complaint  Patient presents with  . Follow-up    Pt here for Xeomin injections.      [facility]  PATIENT: Kaylee Bryant DOB: Aug 07, 1965   REASON FOR VISIT: follow up for MS, relapsing remitting, fatigue abnormality of gait, back pain HISTORY FROM:patient    HISTORY OF PRESENT ILLNESS: HISTORY: She was diagnosed with RRMS in 2007, 6 month post partum, she had numbness from waist done, diagnosis was confirmed by marked abnormal MRI brain, in March,2007. There were multiple T2/Flair periventricular lesions, corpus callosum involvement, mutiple ring enhancing lesions. Also had enhancing lesion in cervical and thoracic spines. She was treated at [facility] by Dr. Dellis Filbert, no csf study, but had extensive lab evaluations to rule out mimics. 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 She denies visual changes or other clear relapsing episodes.  I have reviewed report MRI brain by Dr. Brandon Melnick in 01/2007, multiple T2/FLAIR lesions in both hemisphere with corpus callosum involvement, there are slight increase in lesion load. MRI cervical, stable lesions in C6-7, and C2-3. Thoracic, multiple non enhancing lesions.  She tolerated Rebif well, no flu like illness, but complains of bilateral arm, and the lateral thigh lipoatrophy with repeat Rebif injection. There was no clear relapsed over the years, but she has slow worsening gait difficulty over the past 6 months.  MRI in Sept 2012, MRI of thoracic spine Multiple chronic plaques at C7, T6-7, T8-9, T10.  MRI of cervical spine ,Multiple chronic demyelinating plaques at C3, C4 and C7. MRI of brain Multiple round and ovioid, periventricular and subcortical chronic demyelinating plaques. 2 plaques noted in the  pons. No abnormal lesions are seen on post contrast views.  Her JC-virus antibody was negative. After discuss, we started her on Tysarbri infusion since February of 2013. She tolerated the infusion very well, but complains of excessive fatigue over the past few months, she still able to work full-time as a Marine scientist, she complains of difficulty dragging herself out of bed. Modafinil was not approved by insurance.She was put on Ritalin 10mg  tid, which helped her fatigue. feels much better on Tysabri.  JC-virus antibody negative (July 2014), Negative 0.17 in 11/11/2013,   UPDATE December 16 2013: She is doing very well, her gait has improved, tolerating [name], Ritalin 10 mg tid. for her fatigue.JC-virus antibody negative 11/11/2013 .  Most recent MRI of the brain 10/21/2013 with multiple supra tentorial and infra-tentorial chronic demyelinating plaques. No acute plaques. No change from MRI 05/22/2013. She returns for reevaluation  UPDATE April 4th 2016: She works full time as Therapist, sports, s he has more dizziness spells, especially when she has to stand up, she has occasional headaches, She complains of intermittent sharp pain at her right lower thoracic region, X-ray showed DJD, excerbated by walking, Orthopedic evaluation was normal, including bilateral hip,  She is on Tysarbri,taking baclofen 10mg  tid, works well for her lower extremity She has no incotninence, if she stopped,   UPDATE July 26th 2016:She is now complainng of right side low back pain, radiating to right hip, right hip muscle cramping, sometimes difficulty getting out of the bed, to put on her clothes, 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 She now noticed more fatigue, depression, also noticed mild worsening unsteady gait  I reviewed laboratory evaluations, ferritin 54, vitamin S01, folic acid, iron level was normal, LDL 44, cholesterol 96, A1c 6.4, normal BMP, UA,  UPDATE Apr 08 2017: She has been receiving Tysarbri infusion, last one was in Jan 2019, she is overall doing very well, still has baseline mild gait abnormality, fatigue,  UPDATE July 10 2017: She came in today for EMG guided xeomin injection for frequent facial muscle spasm, sometimes to the point of affecting ability to form the word, open her mouth,  REVIEW OF SYSTEMS: Full 14 system review of systems performed and notable only for those listed, all others are neg:  As above  ALLERGIES: No Known Allergies  HOME MEDICATIONS: Outpatient Medications Prior to Visit  Medication Sig Dispense Refill  . acetaminophen (TYLENOL) 500 MG tablet Take 1,000 mg by mouth every 6 (six) hours as needed for mild pain.    Marland Kitchen aspirin EC 81 MG tablet Take 81 mg by mouth daily.    Marland Kitchen atorvastatin (LIPITOR) 80 MG tablet TAKE 1 TABLET BY MOUTH EVERY DAY AT 6PM 30 tablet 5  . baclofen (LIORESAL) 10 MG tablet Take 2 tablets (20 mg total) by mouth 3 (three) times daily. 180 tablet 5  . docusate sodium (COLACE) 100 MG capsule Take 100 mg by mouth 2 (two) times daily.    Marland Kitchen gabapentin (NEURONTIN) 300 MG capsule TAKE 2 CAPSULES (600 MG TOTAL) BY MOUTH 3 (THREE) TIMES DAILY. 180 capsule 8  . lisinopril (PRINIVIL,ZESTRIL) 5 MG tablet TAKE 1 TABLET BY MOUTH EVERY DAY 30 tablet 5  . metFORMIN (GLUCOPHAGE-XR) 500 MG 24 hr tablet Take 1,000 mg by mouth 2 (two) times daily.       . metoprolol tartrate (LOPRESSOR) 25 MG tablet TAKE 1 TABLET BY MOUTH TWICE A DAY 60 tablet 5  . natalizumab (TYSABRI) 300 MG/15ML injection Inject 15 mLs (300 mg total) into the vein every 30 (thirty) days. 15 mL 11  . venlafaxine XR (EFFEXOR-XR) 75 MG 24 hr capsule TAKE 1 CAPSULE (75 MG TOTAL) BY MOUTH DAILY WITH BREAKFAST. 30 capsule 11  . glipiZIDE (GLUCOTROL XL) 5 MG 24 hr tablet Take 5 mg by mouth daily. with food  3   No facility-administered medications prior to visit.     PAST MEDICAL HISTORY: Past Medical History:  Diagnosis Date  . 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 Coronary artery disease involving native coronary artery with unstable angina pectoris ([place]) 04/05/2015  . DDD (degenerative disc disease), lumbar   . DM (diabetes mellitus), gestational 2006   insulin at the time  . Fatigue   . Heart attack ([place])   . IBS (irritable bowel syndrome)    with chronic constipation  . Multiple sclerosis ([place])   . Obesity   . 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 CARDIAC CATHETERIZATION N/A 04/05/2015   Procedure: Left Heart Cath and Coronary Angiography;  Surgeon: Troy Sine, MD;  Location: [facility];  Service: Cardiovascular;  Laterality: N/A;  . CERVICAL CONE BIOPSY  2004  . [place], 2006  . CHOLECYSTECTOMY     1988  . COLPOSCOPY  2004  . CORONARY ARTERY BYPASS GRAFT N/A 04/07/2015   Procedure: CORONARY ARTERY BYPASS GRAFTING (CABG), OFF PUMP, TIMES ONE, USING LEFT INTERNAL MAMMARY ARTERY;  Surgeon: Rexene Alberts, MD;  Location: [facility];  Service: Open Heart Surgery;  Laterality: N/A;  LIMA to LAD  . TEE WITHOUT CARDIOVERSION N/A 04/07/2015   Procedure: TRANSESOPHAGEAL ECHOCARDIOGRAM (TEE);  Surgeon: Rexene Alberts, MD;  Location: [facility];  Service: Open Heart Surgery;  Laterality: N/A;    FAMILY HISTORY: Family History  Problem Relation Age of  Onset  . Diabetes Mother   . Hypertension Mother   . Hypertension Father   . Atrial fibrillation Father   . Diabetes Sister   . Breast cancer Maternal Aunt   . Arrhythmia Sister   . Heart attack Maternal Grandfather   . Stroke Neg Hx     SOCIAL HISTORY: Social History   Socioeconomic History  . Marital status: Married    Spouse name: Not on file  . Number of children: 4  . Years of education: college  . Highest education level: Not on file  Occupational History  . Occupation: Programmer, multimedia: [employer]  . Financial resource strain: Not on file  . Food insecurity:    Worry: Not on file    Inability: Not on file  . Transportation needs:    Medical: Not on file    Non-medical: Not on file  Tobacco Use  . Smoking status: Former Smoker    Packs/day: 1.50    Years: 15.00    Pack years: 22.50    Types: Cigarettes    Last attempt to quit: 02/03/2004    Years since quitting: 13.4  . Smokeless tobacco: Never Used  Substance and Sexual Activity  . Alcohol use: Yes    Alcohol/week: 0.6 oz    Types: 1 Glasses of wine per week  . Drug use: No  . Sexual activity: Yes    Partners: Male    Birth control/protection: Surgical    Comment: vasectomy  Lifestyle  . Physical activity:    Days per week: Not on file    Minutes per session: Not on file  . Stress: Not on file  Relationships  . Social connections:    Talks on phone: Not on file    Gets together: Not on file    Attends religious service: Not on file    Active member of club or organization: Not on file    Attends meetings of clubs or organizations: Not on file    Relationship status: Not on file  . Intimate partner violence:    Fear of current or ex partner: Not on file    Emotionally abused: Not on file    Physically abused: Not on file    Forced sexual activity: Not on file  Other Topics Concern  . Not on file  Social History Narrative   Patient lives at home with her husband and  children. Patient works for the [employer] in [place]. College education.  PHYSICAL EXAM  Vitals:   07/10/17 1038  BP: 128/78  Pulse: 61  Weight: 225 lb 8 oz (102.3 kg)  Height: 5\' 3"  (1.6 m)   Body mass index is 39.95 kg/m.  Generalized: Well developed, obese female in no acute distress  Head: normocephalic and atraumatic,. Oropharynx benign  Neck: Supple, no carotid bruits  Cardiac: Regular rate rhythm, no murmur  Musculoskeletal: No deformity   Neurological examination   Mentation: Alert oriented to time, place, history taking. Attention span and concentration appropriate. Recent and remote memory intact.  Follows all commands speech and language fluent.   Cranial nerve II-XII: Fundoscopic exam reveals sharp disc margins.Pupils were equal round reactive to light extraocular movements were full, visual field were full on confrontational test. Facial sensation and strength were normal. hearing was intact to finger rubbing bilaterally. Uvula tongue midline. head turning and shoulder shrug were normal and symmetric.Tongue protrusion into cheek strength was normal. Motor: She has mild spasticity of bilateral lower extremities, mild bilateral hip flexion weakness Sensory: normal and symmetric to light touch, pinprick, and  Vibration, proprioception in upper and lower extremities Coordination: finger-nose-finger, heel-to-shin bilaterally, no dysmetria Reflexes: Brachioradialis 2/2, biceps 2/2, triceps 2/2, patellar 2/2, Achilles 2/2, plantar responses were flexor bilaterally. Gait and Station: Getting up from seated position pushed on chair arm, cautious, mildly unsteady  DIAGNOSTIC DATA (LABS, IMAGING, TESTING) - I reviewed patient records, labs, notes, testing and imaging myself where available.  Lab Results  Component Value Date   WBC 10.7 11/30/2015   HGB 13.9 11/30/2015   HCT 40.9 11/30/2015   MCV 89 11/30/2015   PLT 294 11/30/2015      Component Value Date/Time   NA  143 11/30/2015 1626   K 4.7 11/30/2015 1626   CL 100 11/30/2015 1626   CO2 29 11/30/2015 1626   GLUCOSE 96 11/30/2015 1626   GLUCOSE 145 (H) 04/11/2015 0537   BUN 17 11/30/2015 1626   CREATININE 0.74 11/30/2015 1626   CREATININE 0.65 06/02/2013 1640   CALCIUM 9.6 11/30/2015 1626   PROT 6.7 08/09/2015 0854   PROT 6.9 01/18/2015 0000   ALBUMIN 4.1 08/09/2015 0854   ALBUMIN 4.3 01/18/2015 0000   AST 25 08/09/2015 0854   ALT 25 08/09/2015 0854   ALKPHOS 66 08/09/2015 0854   BILITOT 0.5 08/09/2015 0854   BILITOT 0.4 01/18/2015 0000   GFRNONAA 95 11/30/2015 1626   GFRAA 110 11/30/2015 1626   Tysarbri infusion since Feb 2013 JC-virus antibody negative (July 2014), Negative 0.17 in 11/11/2013, 07/07/2014 JCV 0.19 negative. -0.13 in September 2018  ASSESSMENT AND PLAN  52 y.o. year old female   Relapsing remitting multiple sclerosis  Continue with [name] infusion  Restless leg syndrome  She has the urge to move her legs at evening and nighttime,  Continue gabapentin 300 mg 2 tablets 3 times a day  Depression:  Continue Effexor ER 75 mg every day  Facial  dystonia, muscle spasm  EMG guided xeomin injection, 50 units at each side  Right masseters 25 units Right temporalis 25 units  Left masseters 25 units Left temporalis 25 units  Gait abnormality  Refer her to physical therapy  Marcial Pacas, M.D. Ph.D.  [facility] [place], [place] [address] Phone: [phone] Fax:      [phone]

## 2017-07-10 NOTE — Telephone Encounter (Signed)
Appointment canceled.

## 2017-07-10 NOTE — Progress Notes (Signed)
**  Xeomin 100 units, [place] [id number], Lot [id number], Exp 02/2019, sample medication.//mck,rn**

## 2017-07-10 NOTE — Telephone Encounter (Signed)
Spoke to patient - she is being worked into the schedule today for injections with Xeomin 100 units.  At today's visit, Dr. Krista Blue will use sample stock while we are getting approval for future injections.

## 2017-07-15 ENCOUNTER — Encounter: Payer: Self-pay | Admitting: *Deleted

## 2017-07-15 ENCOUNTER — Telehealth: Payer: Self-pay | Admitting: Neurology

## 2017-07-15 NOTE — Telephone Encounter (Signed)
Spoke to patient - she is still taking baclofen 10mg , two tablets TID for her muscle spasms.  The medication works well during the day but she is still having intense pain during the night that disrupts her sleep.  She is asking for a dose adjustment and/or medication change, if appropriate.

## 2017-07-15 NOTE — Telephone Encounter (Signed)
increase night dose of baclofen to 20 mg in addition to 10mg  bid during day

## 2017-07-15 NOTE — Telephone Encounter (Signed)
Pt called stating Dr. Krista Blue had mentioned sending in a prescription for a muscle relaxer. Please call to advise

## 2017-07-15 NOTE — Telephone Encounter (Signed)
Okay then increase the nighttime dose of baclofen to 40 mg keeping the daytime dose to 20 mg twice daily

## 2017-07-15 NOTE — Telephone Encounter (Signed)
Pt will try this increase with the supply she has at home and will call for early refill, if effective.

## 2017-07-22 DIAGNOSIS — G35 Multiple sclerosis: Secondary | ICD-10-CM | POA: Diagnosis not present

## 2017-07-31 DIAGNOSIS — G35 Multiple sclerosis: Secondary | ICD-10-CM | POA: Diagnosis not present

## 2017-08-27 DIAGNOSIS — G35 Multiple sclerosis: Secondary | ICD-10-CM | POA: Diagnosis not present

## 2017-08-28 DIAGNOSIS — G35 Multiple sclerosis: Secondary | ICD-10-CM | POA: Diagnosis not present

## 2017-08-31 ENCOUNTER — Other Ambulatory Visit: Payer: Self-pay | Admitting: Neurology

## 2017-09-24 DIAGNOSIS — G35 Multiple sclerosis: Secondary | ICD-10-CM | POA: Diagnosis not present

## 2017-09-25 ENCOUNTER — Other Ambulatory Visit: Payer: Self-pay | Admitting: Neurology

## 2017-09-27 ENCOUNTER — Other Ambulatory Visit: Payer: Self-pay

## 2017-09-27 DIAGNOSIS — I83891 Varicose veins of right lower extremities with other complications: Secondary | ICD-10-CM

## 2017-09-30 DIAGNOSIS — G35 Multiple sclerosis: Secondary | ICD-10-CM | POA: Diagnosis not present

## 2017-09-30 DIAGNOSIS — E119 Type 2 diabetes mellitus without complications: Secondary | ICD-10-CM | POA: Diagnosis not present

## 2017-10-07 ENCOUNTER — Ambulatory Visit: Payer: 59 | Admitting: Nurse Practitioner

## 2017-10-08 ENCOUNTER — Other Ambulatory Visit: Payer: Self-pay | Admitting: Neurology

## 2017-10-09 ENCOUNTER — Ambulatory Visit: Payer: BLUE CROSS/BLUE SHIELD | Admitting: Vascular Surgery

## 2017-10-09 ENCOUNTER — Other Ambulatory Visit: Payer: Self-pay

## 2017-10-09 ENCOUNTER — Ambulatory Visit (HOSPITAL_COMMUNITY)
Admission: RE | Admit: 2017-10-09 | Discharge: 2017-10-09 | Disposition: A | Discharge status: Home / Self Care | Payer: 59 | Source: Ambulatory Visit | Attending: Vascular Surgery | Admitting: Vascular Surgery

## 2017-10-09 ENCOUNTER — Encounter: Payer: Self-pay | Admitting: Vascular Surgery

## 2017-10-09 VITALS — BP 119/77 | HR 73 | Temp 97.0°F | Resp 16 | Ht 63.0 in | Wt 218.0 lb

## 2017-10-09 DIAGNOSIS — I83891 Varicose veins of right lower extremities with other complications: Secondary | ICD-10-CM | POA: Insufficient documentation

## 2017-10-09 DIAGNOSIS — I83811 Varicose veins of right lower extremities with pain: Secondary | ICD-10-CM

## 2017-10-09 NOTE — Progress Notes (Signed)
Patient name: Kaylee Bryant MRN: [id number] DOB: 12/16/1965 Sex: female   REASON FOR CONSULT:    Varicose veins right lower extremity with leg swelling.  HPI:   Kaylee Bryant is a pleasant 52 y.o. female, who presents with a long history of varicose veins of the lateral aspect of her right leg.  She has had these for some time and over the last several months they seem to have gotten larger and more painful.  She experiences swelling in her right leg at work when she sitting.  She experiences pain over the varicose veins on the lateral aspect of her leg when she is sitting.  Her symptoms are aggravated by sitting.  They are relieved with elevation.  She has not worn compression stockings.  She does use ibuprofen as needed for pain.  She is unaware of any previous history of DVT or phlebitis.  She has had a previous myocardial infarction and underwent coronary revascularization in 2017.  They did not take vein for this they used a LIMA graft.  She also has a history of MS.  I do not get any history of claudication, rest pain, or nonhealing ulcers.  Past Medical History:  Diagnosis Date  . 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 Coronary artery disease involving native coronary artery with unstable angina pectoris ([place]) 04/05/2015  . DDD (degenerative disc disease), lumbar   . DM (diabetes mellitus), gestational 2006   insulin at the time  . Fatigue   . Heart attack ([place])   . IBS (irritable bowel syndrome)    with chronic constipation  . Multiple sclerosis ([place])   . Obesity   . 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 Diabetes Mother   . Hypertension Mother   . Hypertension Father   . Atrial fibrillation Father   . Diabetes Sister   . Breast cancer Maternal Aunt   . Arrhythmia Sister   . Heart attack Maternal Grandfather   . Stroke Neg Hx     SOCIAL HISTORY: She quit smoking in 2005. Social History    Socioeconomic History  . Marital status: Married    Spouse name: Not on file  . Number of children: 4  . Years of education: college  . Highest education level: Not on file  Occupational History  . Occupation: Programmer, multimedia: [employer]  . Financial resource strain: Not on file  . Food insecurity:    Worry: Not on file    Inability: Not on file  . Transportation needs:    Medical: Not on file    Non-medical: Not on file  Tobacco Use  . Smoking status: Former Smoker    Packs/day: 1.50    Years: 15.00    Pack years: 22.50    Types: Cigarettes    Last attempt to quit: 02/03/2004    Years since quitting: 13.6  . Smokeless tobacco: Never Used  Substance and Sexual Activity  . Alcohol use: Yes    Alcohol/week: 0.6 oz    Types: 1 Glasses of wine per week  . Drug use: No  . Sexual activity: Yes    Partners: Male    Birth control/protection: Surgical    Comment: vasectomy  Lifestyle  . Physical activity:    Days per week: Not on file  Minutes per session: Not on file  . Stress: Not on file  Relationships  . Social connections:    Talks on phone: Not on file    Gets together: Not on file    Attends religious service: Not on file    Active member of club or organization: Not on file    Attends meetings of clubs or organizations: Not on file    Relationship status: Not on file  . Intimate partner violence:    Fear of current or ex partner: Not on file    Emotionally abused: Not on file    Physically abused: Not on file    Forced sexual activity: Not on file  Other Topics Concern  . Not on file  Social History Narrative   Patient lives at home with her husband and children. Patient works for the [employer] in [place]. College education.     No Known Allergies  Current Outpatient Medications  Medication Sig Dispense Refill  . acetaminophen (TYLENOL) 500 MG tablet Take 1,000 mg by mouth every 6 (six) hours as needed for mild pain.    Marland Kitchen  aspirin EC 81 MG tablet Take 81 mg by mouth daily.    Marland Kitchen atorvastatin (LIPITOR) 80 MG tablet TAKE 1 TABLET BY MOUTH EVERY DAY AT 6PM 30 tablet 5  . baclofen (LIORESAL) 10 MG tablet TAKE 2 TABLETS (20 MG TOTAL) BY MOUTH 3 (THREE) TIMES DAILY. 180 tablet 11  . gabapentin (NEURONTIN) 300 MG capsule TAKE 2 CAPSULES (600 MG TOTAL) BY MOUTH 3 (THREE) TIMES DAILY. 540 capsule 3  . glipiZIDE (GLUCOTROL XL) 5 MG 24 hr tablet Take 5 mg by mouth daily. with food  3  . lisinopril (PRINIVIL,ZESTRIL) 5 MG tablet TAKE 1 TABLET BY MOUTH EVERY DAY 30 tablet 5  . metFORMIN (GLUCOPHAGE-XR) 500 MG 24 hr tablet Take 1,000 mg by mouth 2 (two) times daily.     . metoprolol tartrate (LOPRESSOR) 25 MG tablet TAKE 1 TABLET BY MOUTH TWICE A DAY 60 tablet 5  . natalizumab (TYSABRI) 300 MG/15ML injection Inject 15 mLs (300 mg total) into the vein every 30 (thirty) days. 15 mL 11  . venlafaxine XR (EFFEXOR-XR) 75 MG 24 hr capsule TAKE 1 CAPSULE (75 MG TOTAL) BY MOUTH DAILY WITH BREAKFAST. 90 capsule 0  . docusate sodium (COLACE) 100 MG capsule Take 100 mg by mouth 2 (two) times daily.     No current facility-administered medications for this visit.     REVIEW OF SYSTEMS:  [X]  denotes positive finding, [ ]  denotes negative finding Cardiac  Comments:  Chest pain or chest pressure:    Shortness of breath upon exertion:    Short of breath when lying flat:    Irregular heart rhythm:        Vascular    Pain in calf, thigh, or hip brought on by ambulation: x   Pain in feet at night that wakes you up from your sleep:     Blood clot in your veins:    Leg swelling:  x       Pulmonary    Oxygen at home:    Productive cough:     Wheezing:         Neurologic    Sudden weakness in arms or legs:     Sudden numbness in arms or legs:     Sudden onset of difficulty speaking or slurred speech:    Temporary loss of vision in one eye:  Problems with dizziness:         Gastrointestinal    Blood in stool:     Vomited blood:          Genitourinary    Burning when urinating:     Blood in urine:        Psychiatric    Major depression:         Hematologic    Bleeding problems:    Problems with blood clotting too easily:        Skin    Rashes or ulcers:        Constitutional    Fever or chills:     PHYSICAL EXAM:   Vitals:   10/09/17 1339  BP: 119/77  Pulse: 73  Resp: 16  Temp: (!) 97 F (36.1 C)  TempSrc: Oral  SpO2: 95%  Weight: 218 lb (98.9 kg)  Height: 5\' 3"  (1.6 m)    GENERAL: The patient is a well-nourished female, in no acute distress. The vital signs are documented above. CARDIAC: There is a regular rate and rhythm.  VASCULAR: I do not detect carotid bruits. She has palpable dorsalis pedis pulses bilaterally. She has some varicose veins along the lateral aspect of her right thigh, right knee, and right leg.  She also has some varicose veins in her medial left thigh. She currently has no significant leg swelling or hyperpigmentation. PULMONARY: There is good air exchange bilaterally without wheezing or rales. ABDOMEN: Soft and non-tender with normal pitched bowel sounds.  MUSCULOSKELETAL: There are no major deformities or cyanosis. NEUROLOGIC: No focal weakness or paresthesias are detected. SKIN: There are no ulcers or rashes noted. PSYCHIATRIC: The patient has a normal affect.  DATA:    VENOUS DUPLEX: I have independently interpreted the venous duplex of the right lower extremity.  There is no evidence of deep venous thrombosis or superficial thrombophlebitis.  There is deep venous reflux noted in the common femoral vein only.  There is no significant superficial venous reflux.  MEDICAL ISSUES:   PAINFUL VARICOSE VEINS RIGHT LOWER EXTREMITY: This patient has painful varicose veins of the right lower extremity.  She only has some deep venous reflux involving the common femoral vein.  There is no significant superficial venous reflux.  The veins are marginal in size for foam  sclerotherapy.  It may be a little bit large.  I have recommended that she elevate her legs and we have discussed proper positioning for this.  I also given her a prescription for thigh-high compression stockings with a gradient of 20 to 30 mmHg.  I have encouraged her to avoid prolonged sitting and standing.  I encouraged her to exercise we also discussed the importance of weight management.  I will see her back in 3 months.  If her symptoms persist I think she could be considered for either phone sclerotherapy of her varicose veins in the right leg or potentially stab phlebectomy (10-20).  Deitra Mayo [facility] [phone]

## 2017-10-13 ENCOUNTER — Other Ambulatory Visit: Payer: Self-pay | Admitting: Cardiovascular Disease

## 2017-10-14 NOTE — Telephone Encounter (Signed)
[id number] (covered at 57 percent after deductible)  and [phone]. Must be filled through [place].   Reference#[id number]

## 2017-10-15 DIAGNOSIS — G243 Spasmodic torticollis: Secondary | ICD-10-CM | POA: Diagnosis not present

## 2017-10-16 ENCOUNTER — Encounter: Payer: Self-pay | Admitting: *Deleted

## 2017-10-16 ENCOUNTER — Encounter: Payer: Self-pay | Admitting: Neurology

## 2017-10-16 ENCOUNTER — Ambulatory Visit (INDEPENDENT_AMBULATORY_CARE_PROVIDER_SITE_OTHER): Payer: 59 | Admitting: Neurology

## 2017-10-16 VITALS — BP 117/66 | HR 63 | Ht 63.0 in | Wt 220.0 lb

## 2017-10-16 DIAGNOSIS — G248 Other dystonia: Secondary | ICD-10-CM

## 2017-10-16 DIAGNOSIS — G35 Multiple sclerosis: Secondary | ICD-10-CM | POA: Diagnosis not present

## 2017-10-16 DIAGNOSIS — G2581 Restless legs syndrome: Secondary | ICD-10-CM

## 2017-10-16 MED ORDER — TIZANIDINE HCL 2 MG PO TABS: 2.0000 mg | ORAL_TABLET | Freq: Three times a day (TID) | ORAL | 6 refills | Status: DC

## 2017-10-16 MED ORDER — BACLOFEN 10 MG PO TABS: 20.0000 mg | ORAL_TABLET | Freq: Four times a day (QID) | ORAL | 11 refills | Status: DC

## 2017-10-16 NOTE — Progress Notes (Signed)
**  Xeomin 100 units x 1 vials, [place] 7981-0254-86, Lot [id number], Exp 12/2019, specialty pharmacy.//mck,rn**

## 2017-10-16 NOTE — Progress Notes (Signed)
Chief Complaint  Patient presents with  . Dystonia    [facility] - specialty pharmacy     [facility]  PATIENT: Kaylee Bryant DOB: 1965-06-19   REASON FOR VISIT: follow up for MS, relapsing remitting, fatigue abnormality of gait, back pain HISTORY FROM:patient    HISTORY OF PRESENT ILLNESS: HISTORY: She was diagnosed with RRMS in 2007, 6 month post partum, she had numbness from waist done, diagnosis was confirmed by marked abnormal MRI brain, in March,2007. There were multiple T2/Flair periventricular lesions, corpus callosum involvement, mutiple ring enhancing lesions. Also had enhancing lesion in cervical and thoracic spines. She was treated at [facility] by Dr. Dellis Filbert, no csf study, but had extensive lab evaluations to rule out mimics. 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 She denies visual changes or other clear relapsing episodes.  I have reviewed report MRI brain by Dr. Brandon Melnick in 01/2007, multiple T2/FLAIR lesions in both hemisphere with corpus callosum involvement, there are slight increase in lesion load. MRI cervical, stable lesions in C6-7, and C2-3. Thoracic, multiple non enhancing lesions.  She tolerated Rebif well, no flu like illness, but complains of bilateral arm, and the lateral thigh lipoatrophy with repeat Rebif injection. There was no clear relapsed over the years, but she has slow worsening gait difficulty over the past 6 months.  MRI in Sept 2012, MRI of thoracic spine Multiple chronic plaques at C7, T6-7, T8-9, T10.  MRI of cervical spine ,Multiple chronic demyelinating plaques at C3, C4 and C7. MRI of brain Multiple round and ovioid, periventricular and subcortical chronic demyelinating plaques. 2 plaques noted in the pons. No  abnormal lesions are seen on post contrast views.  Her JC-virus antibody was negative. After discuss, we started her on Tysarbri infusion since February of 2013. She tolerated the infusion very well, but complains of excessive fatigue over the past few months, she still able to work full-time as a Marine scientist, she complains of difficulty dragging herself out of bed. Modafinil was not approved by insurance.She was put on Ritalin 10mg  tid, which helped her fatigue. feels much better on Tysabri.  JC-virus antibody negative (July 2014), Negative 0.17 in 11/11/2013,   UPDATE December 16 2013: She is doing very well, her gait has improved, tolerating [name], Ritalin 10 mg tid. for her fatigue.JC-virus antibody negative 11/11/2013 .  Most recent MRI of the brain 10/21/2013 with multiple supra tentorial and infra-tentorial chronic demyelinating plaques. No acute plaques. No change from MRI 05/22/2013. She returns for reevaluation  UPDATE April 4th 2016: She works full time as Therapist, sports, s he has more dizziness spells, especially when she has to stand up, she has occasional headaches, She complains of intermittent sharp pain at her right lower thoracic region, X-ray showed DJD, excerbated by walking, Orthopedic evaluation was normal, including bilateral hip,  She is on Tysarbri,taking baclofen 10mg  tid, works well for her lower extremity She has no incotninence, if she stopped,   UPDATE July 26th 2016:She is now complainng of right side low back pain, radiating to right hip, right hip muscle cramping, sometimes difficulty getting out of the bed, to put on her clothes, 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 She now noticed more fatigue, depression, also noticed mild worsening unsteady gait  I reviewed laboratory evaluations, ferritin 54, vitamin W09, folic acid, iron level was normal, LDL 44, cholesterol 96, A1c 6.4, normal BMP, UA,  UPDATE Apr 08 2017: She has been receiving Tysarbri infusion, last one was in Jan 2019, she is overall doing very well, still has baseline mild gait abnormality, fatigue,  UPDATE July 10 2017: She came in today for EMG guided xeomin injection for frequent facial muscle spasm, sometimes to the point of affecting ability to form the word, open her mouth,  UPDATE October 16 2017: She reported MRI of lumbar recently by orthopedic surgeon Dr. Gladstone Lighter showed only mild degenerative changes, but increased upper back to lower back muscle spasm, despite higher dose of baclofen 80 mg daily, 20/40/20,  Over the years, she complains of progressive worsening gait abnormality.  She has been on Tysabri since February 2013.  She reported progressive lower extremity low back muscle spasm, urinary urgency, difficulty falling visual tract,   REVIEW OF SYSTEMS: Full 14 system review of systems performed and notable only for those listed, all others are neg:  As above  ALLERGIES: No Known Allergies  HOME MEDICATIONS: Outpatient Medications Prior to Visit  Medication Sig Dispense Refill  . acetaminophen (TYLENOL) 500 MG tablet Take 1,000 mg by mouth every 6 (six) hours as needed for mild pain.    Marland Kitchen aspirin EC 81 MG tablet Take 81 mg by mouth daily.    Marland Kitchen atorvastatin (LIPITOR) 80 MG tablet TAKE 1 TABLET BY MOUTH EVERY DAY AT 6PM 30 tablet 5  . baclofen (LIORESAL) 10 MG tablet  TAKE 2 TABLETS (20 MG TOTAL) BY MOUTH 3 (THREE) TIMES DAILY. 180 tablet 11  . docusate sodium (COLACE) 100 MG capsule Take 100 mg by mouth 2 (two) times daily.    Marland Kitchen gabapentin (NEURONTIN) 300 MG capsule TAKE 2 CAPSULES (600 MG TOTAL) BY MOUTH 3 (THREE) TIMES DAILY. 540 capsule 3  . glipiZIDE (GLUCOTROL XL) 5 MG 24 hr tablet Take 10 mg by mouth daily. with food  3  . lisinopril (PRINIVIL,ZESTRIL) 5 MG tablet Take 1 tablet (5 mg total) by mouth daily. Please call to schedule appt for further refills. 90 tablet 0  . metFORMIN (GLUCOPHAGE-XR) 500 MG 24 hr tablet Take 1,000 mg by mouth 2 (two) times daily.     . metoprolol tartrate (LOPRESSOR) 25 MG tablet TAKE 1 TABLET BY MOUTH TWICE A DAY 60 tablet 5  . natalizumab (TYSABRI) 300 MG/15ML injection Inject 15 mLs (300 mg total) into the vein every 30 (thirty) days. 15 mL 11  . venlafaxine XR (EFFEXOR-XR) 75 MG 24 hr capsule TAKE 1 CAPSULE (75 MG TOTAL) BY MOUTH DAILY WITH BREAKFAST. 90 capsule 0   No facility-administered medications prior to visit.     PAST MEDICAL HISTORY: Past Medical History:  Diagnosis Date  . 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 Coronary artery  disease involving native coronary artery with unstable angina pectoris ([place]) 04/05/2015  . DDD (degenerative disc disease), lumbar   . DM (diabetes mellitus), gestational 2006   insulin at the time  . Fatigue   . Heart attack ([place])   . IBS (irritable bowel syndrome)    with chronic constipation  . Multiple sclerosis ([place])   . Obesity   . 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 CARDIAC CATHETERIZATION N/A 04/05/2015   Procedure: Left Heart Cath and Coronary Angiography;  Surgeon: Troy Sine, MD;  Location: [facility];  Service: Cardiovascular;  Laterality: N/A;  . CERVICAL CONE BIOPSY  2004  . [place], 2006  . CHOLECYSTECTOMY     1988  . COLPOSCOPY   2004  . CORONARY ARTERY BYPASS GRAFT N/A 04/07/2015   Procedure: CORONARY ARTERY BYPASS GRAFTING (CABG), OFF PUMP, TIMES ONE, USING LEFT INTERNAL MAMMARY ARTERY;  Surgeon: Rexene Alberts, MD;  Location: [facility];  Service: Open Heart Surgery;  Laterality: N/A;  LIMA to LAD  . TEE WITHOUT CARDIOVERSION N/A 04/07/2015   Procedure: TRANSESOPHAGEAL ECHOCARDIOGRAM (TEE);  Surgeon: Rexene Alberts, MD;  Location: [facility];  Service: Open Heart Surgery;  Laterality: N/A;    FAMILY HISTORY: Family History  Problem Relation Age of Onset  . Diabetes Mother   . Hypertension Mother   . Hypertension Father   . Atrial fibrillation Father   . Diabetes Sister   . Breast cancer Maternal Aunt   . Arrhythmia Sister   . Heart attack Maternal Grandfather   . Stroke Neg Hx     SOCIAL HISTORY: Social History   Socioeconomic History  . Marital status: Married    Spouse name: Not on file  . Number of children: 4  . Years of education: college  . Highest education level: Not on file  Occupational History  . Occupation: Programmer, multimedia: [employer]  . Financial resource strain: Not on file  . Food insecurity:    Worry: Not on file    Inability: Not on file  . Transportation needs:    Medical: Not on file    Non-medical: Not on file  Tobacco Use  . Smoking status: Former Smoker    Packs/day: 1.50    Years: 15.00    Pack years: 22.50    Types: Cigarettes    Last attempt to quit: 02/03/2004    Years since quitting: 13.7  . Smokeless tobacco: Never Used  Substance and Sexual Activity  . Alcohol use: Yes    Alcohol/week: 0.6 oz    Types: 1 Glasses of wine per week  . Drug use: No  . Sexual activity: Yes    Partners: Male    Birth control/protection: Surgical    Comment: vasectomy  Lifestyle  . Physical activity:    Days per week: Not on file    Minutes per session: Not on file  . Stress: Not on file  Relationships  . Social connections:    Talks on phone: Not on  file    Gets together: Not on file    Attends religious service: Not on file    Active member of club or organization: Not on file    Attends meetings of clubs or organizations: Not on file    Relationship status: Not  on file  . Intimate partner violence:    Fear of current or ex partner: Not on file    Emotionally abused: Not on file    Physically abused: Not on file    Forced sexual activity: Not on file  Other Topics Concern  . Not on file  Social History Narrative   Patient lives at home with her husband and children. Patient works for the [employer] in [place]. College education.      PHYSICAL EXAM  Vitals:   10/16/17 1445  BP: 117/66  Pulse: 63  Weight: 220 lb (99.8 kg)  Height: 5\' 3"  (1.6 m)   Body mass index is 38.97 kg/m.  Generalized: Well developed, obese female in no acute distress  Head: normocephalic and atraumatic,. Oropharynx benign  Neck: Supple, no carotid bruits  Cardiac: Regular rate rhythm, no murmur  Musculoskeletal: No deformity   Neurological examination   Mentation: Alert oriented to time, place, history taking. Attention span and concentration appropriate. Recent and remote memory intact.  Follows all commands speech and language fluent.   Cranial nerve II-XII: Fundoscopic exam reveals sharp disc margins.Pupils were equal round reactive to light extraocular movements were full, visual field were full on confrontational test. Facial sensation and strength were normal. hearing was intact to finger rubbing bilaterally. Uvula tongue midline. head turning and shoulder shrug were normal and symmetric.Tongue protrusion into cheek strength was normal. Motor: She has mild spasticity of bilateral lower extremities, mild bilateral hip flexion weakness Sensory: normal and symmetric to light touch, pinprick, and  Vibration, proprioception in upper and lower extremities Coordination: finger-nose-finger, heel-to-shin bilaterally, no dysmetria Reflexes:  Brachioradialis 2/2, biceps 2/2, triceps 2/2, patellar 2/2, Achilles 2/2, plantar responses were flexor bilaterally. Gait and Station: Getting up from seated position pushed on chair arm, cautious, mildly unsteady, dragging left leg more than right, the past  DIAGNOSTIC DATA (LABS, IMAGING, TESTING) - I reviewed patient records, labs, notes, testing and imaging myself where available.  Lab Results  Component Value Date   WBC 10.7 11/30/2015   HGB 13.9 11/30/2015   HCT 40.9 11/30/2015   MCV 89 11/30/2015   PLT 294 11/30/2015      Component Value Date/Time   NA 143 11/30/2015 1626   K 4.7 11/30/2015 1626   CL 100 11/30/2015 1626   CO2 29 11/30/2015 1626   GLUCOSE 96 11/30/2015 1626   GLUCOSE 145 (H) 04/11/2015 0537   BUN 17 11/30/2015 1626   CREATININE 0.74 11/30/2015 1626   CREATININE 0.65 06/02/2013 1640   CALCIUM 9.6 11/30/2015 1626   PROT 6.7 08/09/2015 0854   PROT 6.9 01/18/2015 0000   ALBUMIN 4.1 08/09/2015 0854   ALBUMIN 4.3 01/18/2015 0000   AST 25 08/09/2015 0854   ALT 25 08/09/2015 0854   ALKPHOS 66 08/09/2015 0854   BILITOT 0.5 08/09/2015 0854   BILITOT 0.4 01/18/2015 0000   GFRNONAA 95 11/30/2015 1626   GFRAA 110 11/30/2015 1626   Tysarbri infusion since Feb 2013 JC-virus antibody negative (July 2014), Negative 0.17 in 11/11/2013, 07/07/2014 JCV 0.19 negative. -0.13 in September 2018  ASSESSMENT AND PLAN  52 y.o. year old female   Relapsing remitting multiple sclerosis  Tysarbri infusion since February 2013   progressive worsening gait abnormality, lower extremity back muscle spasm  We have discussed the possibility of switching to ocrelizumab, went over the clinical trial data with her in detail,  I have suggested her to have shingles vaccination,  Add on tizanidine 2 mg 3 times a  day for lower extremity spasticity.  Restless leg syndrome  She has the urge to move her legs at evening and nighttime,  Continue gabapentin 300 mg 2 tablets 3 times a  day  Depression:  Continue Effexor ER 75 mg every day  Facial  dystonia, muscle spasm  EMG guided xeomin injection, 50 units at each side, for total of 100 units  Right masseters 25 units Right temporalis 25 units  Left masseters 25 units Left temporalis 25 units  Marcial Pacas, M.D. Ph.D.  [facility] [place], [place] [address] Phone: [phone] Fax:      [phone]

## 2017-10-22 DIAGNOSIS — G35 Multiple sclerosis: Secondary | ICD-10-CM | POA: Diagnosis not present

## 2017-10-28 DIAGNOSIS — G35 Multiple sclerosis: Secondary | ICD-10-CM | POA: Diagnosis not present

## 2017-11-07 ENCOUNTER — Other Ambulatory Visit: Payer: Self-pay | Admitting: Cardiovascular Disease

## 2017-11-07 NOTE — Telephone Encounter (Signed)
Rx request sent to pharmacy.  

## 2017-12-03 DIAGNOSIS — G35 Multiple sclerosis: Secondary | ICD-10-CM | POA: Diagnosis not present

## 2017-12-12 ENCOUNTER — Other Ambulatory Visit: Payer: Self-pay | Admitting: Cardiovascular Disease

## 2017-12-17 DIAGNOSIS — G35 Multiple sclerosis: Secondary | ICD-10-CM | POA: Diagnosis not present

## 2017-12-28 ENCOUNTER — Other Ambulatory Visit: Payer: Self-pay | Admitting: Cardiovascular Disease

## 2017-12-31 DIAGNOSIS — G35 Multiple sclerosis: Secondary | ICD-10-CM | POA: Diagnosis not present

## 2018-01-08 ENCOUNTER — Other Ambulatory Visit: Payer: Self-pay | Admitting: Cardiovascular Disease

## 2018-01-08 NOTE — Telephone Encounter (Signed)
Rx request sent to pharmacy.  

## 2018-01-09 ENCOUNTER — Ambulatory Visit: Payer: BLUE CROSS/BLUE SHIELD | Admitting: Vascular Surgery

## 2018-01-10 ENCOUNTER — Encounter: Payer: Self-pay | Admitting: Vascular Surgery

## 2018-01-13 ENCOUNTER — Other Ambulatory Visit: Payer: Self-pay | Admitting: Cardiovascular Disease

## 2018-01-15 ENCOUNTER — Other Ambulatory Visit: Payer: Self-pay | Admitting: Neurology

## 2018-01-15 ENCOUNTER — Encounter: Payer: Self-pay | Admitting: Neurology

## 2018-01-15 ENCOUNTER — Ambulatory Visit (INDEPENDENT_AMBULATORY_CARE_PROVIDER_SITE_OTHER): Payer: 59 | Admitting: Neurology

## 2018-01-15 ENCOUNTER — Other Ambulatory Visit: Payer: Self-pay | Admitting: Cardiovascular Disease

## 2018-01-15 VITALS — BP 149/81 | HR 64 | Ht 63.0 in | Wt 218.8 lb

## 2018-01-15 DIAGNOSIS — G248 Other dystonia: Secondary | ICD-10-CM | POA: Diagnosis not present

## 2018-01-15 DIAGNOSIS — G35 Multiple sclerosis: Secondary | ICD-10-CM

## 2018-01-15 NOTE — Progress Notes (Signed)
Chief Complaint  Patient presents with  . Dystonia    Xeomin 100 units x 1 vial - specialty pharmacy     [facility]  PATIENT: Kaylee Bryant DOB: 1965-08-01   REASON FOR VISIT: follow up for MS, relapsing remitting, fatigue abnormality of gait, back pain HISTORY FROM:patient    HISTORY OF PRESENT ILLNESS: HISTORY: She was diagnosed with RRMS in 2007, 6 month post partum, she had numbness from waist done, diagnosis was confirmed by marked abnormal MRI brain, in March,2007. There were multiple T2/Flair periventricular lesions, corpus callosum involvement, mutiple ring enhancing lesions. Also had enhancing lesion in cervical and thoracic spines. She was treated at [facility] by Dr. Dellis Filbert, no csf study, but had extensive lab evaluations to rule out mimics. 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 She denies visual changes or other clear relapsing episodes.  I have reviewed report MRI brain by Dr. Brandon Melnick in 01/2007, multiple T2/FLAIR lesions in both hemisphere with corpus callosum involvement, there are slight increase in lesion load. MRI cervical, stable lesions in C6-7, and C2-3. Thoracic, multiple non enhancing lesions.  She tolerated Rebif well, no flu like illness, but complains of bilateral arm, and the lateral thigh lipoatrophy with repeat Rebif injection. There was no clear relapsed over the years, but she has slow worsening gait difficulty over the past 6 months.  MRI in Sept 2012, MRI of thoracic spine Multiple chronic plaques at C7, T6-7, T8-9, T10.  MRI of cervical spine ,Multiple chronic demyelinating plaques at C3, C4 and C7. MRI of brain Multiple round and ovioid, periventricular and subcortical chronic demyelinating plaques. 2 plaques  noted in the pons. No abnormal lesions are seen on post contrast views.  Her JC-virus antibody was negative. After discuss, we started her on Tysarbri infusion since February of 2013. She tolerated the infusion very well, but complains of excessive fatigue over the past few months, she still able to work full-time as a Marine scientist, she complains of difficulty dragging herself out of bed. Modafinil was not approved by insurance.She was put on Ritalin 10mg  tid, which helped her fatigue. feels much better on Tysabri.  JC-virus antibody negative (July 2014), Negative 0.17 in 11/11/2013,   UPDATE December 16 2013: She is doing very well, her gait has improved, tolerating [name], Ritalin 10 mg tid. for her fatigue.JC-virus antibody negative 11/11/2013 .  Most recent MRI of the brain 10/21/2013 with multiple supra tentorial and infra-tentorial chronic demyelinating plaques. No acute plaques. No change from MRI 05/22/2013. She returns for reevaluation  UPDATE April 4th 2016: She works full time as Therapist, sports, s he has more dizziness spells, especially when she has to stand up, she has occasional headaches, She complains of intermittent sharp pain at her right lower thoracic region, X-ray showed DJD, excerbated by walking, Orthopedic evaluation was normal, including bilateral hip,  She is on Tysarbri,taking baclofen 10mg  tid, works well for her lower extremity She has no incotninence, if she stopped,   UPDATE July 26th 2016:She is now complainng of right side low back pain, radiating to right hip, right hip muscle cramping, sometimes difficulty getting out of the bed, to put on her clothes, 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 She now noticed more fatigue, depression, also noticed mild worsening unsteady gait  I reviewed laboratory evaluations, ferritin 54, vitamin W10, folic acid, iron level was normal, LDL 44, cholesterol 96, A1c 6.4, normal BMP, UA,  UPDATE Apr 08 2017: She has been receiving Tysarbri infusion, last one was in Jan 2019, she is overall doing very well, still has baseline mild gait abnormality, fatigue,  UPDATE July 10 2017: She came in today for EMG guided xeomin injection for frequent facial muscle spasm, sometimes to the point of affecting ability to form the word, open her mouth,  UPDATE October 16 2017: She reported MRI of lumbar recently by orthopedic surgeon Dr. Gladstone Lighter showed only mild degenerative changes, but increased upper back to lower back muscle spasm, despite higher dose of baclofen 80 mg daily, 20/40/20,  Over the years, she complains of progressive worsening gait abnormality.  She has been on Tysabri since February 2013.  She reported progressive lower extremity low back muscle spasm, urinary urgency, difficulty falling visual tract,  UPDATE Jan 15 2018: She responded well to previous xeomin injection   REVIEW OF SYSTEMS: Full 14 system review of systems performed and notable only for those listed, all others are neg:  As above  ALLERGIES: No Known Allergies  HOME MEDICATIONS: Outpatient Medications Prior to Visit  Medication Sig Dispense Refill  . acetaminophen (TYLENOL) 500 MG tablet Take 1,000 mg by mouth every 6 (six) hours as needed for mild pain.    Marland Kitchen aspirin EC 81 MG tablet Take 81 mg by mouth daily.    Marland Kitchen atorvastatin (LIPITOR) 80  MG tablet Take 1 tablet (80 mg total) by mouth daily at 6 PM. NEED OV. 90 tablet 0  . baclofen (LIORESAL) 10 MG tablet Take 2 tablets (20 mg total) by mouth 4 (four) times daily. 240 tablet 11  . docusate sodium (COLACE) 100 MG capsule Take 100 mg by mouth 2 (two) times daily.    Marland Kitchen gabapentin (NEURONTIN) 300 MG capsule TAKE 2 CAPSULES (600 MG TOTAL) BY MOUTH 3 (THREE) TIMES DAILY. 540 capsule 3  . glipiZIDE (GLUCOTROL XL) 5 MG 24 hr tablet Take 10 mg by mouth daily. with food  3  . lisinopril (PRINIVIL,ZESTRIL) 5 MG tablet TAKE 1 TABLET (5 MG TOTAL) BY MOUTH DAILY. PLEASE CALL TO SCHEDULE APPT FOR FURTHER REFILLS. 30 tablet 0  . lisinopril (PRINIVIL,ZESTRIL) 5 MG tablet TAKE 1 TABLET (5 MG TOTAL) BY MOUTH DAILY. PLEASE CALL TO SCHEDULE APPT FOR FURTHER REFILLS. 30 tablet 0  . metFORMIN (GLUCOPHAGE-XR) 500 MG 24 hr tablet Take 1,000 mg by mouth 2 (two) times daily.     . metoprolol tartrate (LOPRESSOR) 25 MG tablet Take 1 tablet (25 mg total) by mouth 2 (two) times daily. Please schedule appointment for further refills, 2nd attempt. 30 tablet 0  . natalizumab (TYSABRI) 300 MG/15ML injection Inject 15 mLs (300 mg total) into the vein every 30 (thirty) days. 15 mL  11  . tiZANidine (ZANAFLEX) 2 MG tablet Take 1 tablet (2 mg total) by mouth 3 (three) times daily. 90 tablet 6  . venlafaxine XR (EFFEXOR-XR) 75 MG 24 hr capsule TAKE 1 CAPSULE (75 MG TOTAL) BY MOUTH DAILY WITH BREAKFAST. 90 capsule 0   No facility-administered medications prior to visit.     PAST MEDICAL HISTORY: Past Medical History:  Diagnosis Date  . 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 Coronary artery disease involving native coronary artery with unstable angina pectoris ([place]) 04/05/2015  . DDD (degenerative disc disease), lumbar   . DM (diabetes mellitus), gestational 2006   insulin at the time  . Fatigue   . Heart attack ([place])   . IBS (irritable bowel syndrome)    with chronic constipation  . Multiple sclerosis  ([place])   . Obesity   . 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 CARDIAC CATHETERIZATION N/A 04/05/2015   Procedure: Left Heart Cath and Coronary Angiography;  Surgeon: Troy Sine, MD;  Location: [facility];  Service: Cardiovascular;  Laterality: N/A;  . CERVICAL CONE BIOPSY  2004  . [place], 2006  . CHOLECYSTECTOMY     1988  . COLPOSCOPY  2004  . CORONARY ARTERY BYPASS GRAFT N/A 04/07/2015   Procedure: CORONARY ARTERY BYPASS GRAFTING (CABG), OFF PUMP, TIMES ONE, USING LEFT INTERNAL MAMMARY ARTERY;  Surgeon: Rexene Alberts, MD;  Location: [place];  Service: Open Heart Surgery;  Laterality: N/A;  LIMA to LAD  . TEE WITHOUT CARDIOVERSION N/A 04/07/2015   Procedure: TRANSESOPHAGEAL ECHOCARDIOGRAM (TEE);  Surgeon: Rexene Alberts, MD;  Location: [place];  Service: Open Heart Surgery;  Laterality: N/A;    FAMILY HISTORY: Family History  Problem Relation Age of Onset  . Diabetes Mother   . Hypertension Mother   . Hypertension Father   . Atrial fibrillation Father   . Diabetes Sister   . Breast cancer Maternal Aunt   . Arrhythmia Sister   . Heart attack Maternal Grandfather   . Stroke Neg Hx     SOCIAL HISTORY: Social History   Socioeconomic History  . Marital status: Married    Spouse name: Not on file  . Number of children: 4  . Years of education: college  . Highest education level: Not on file  Occupational History  . Occupation: Programmer, multimedia: [employer]  . Financial resource strain: Not on file  . Food insecurity:    Worry: Not on file    Inability: Not on file  . Transportation needs:    Medical: Not on file    Non-medical: Not on file  Tobacco Use  . Smoking status: Former Smoker    Packs/day: 1.50    Years: 15.00    Pack years: 22.50    Types: Cigarettes    Last attempt to quit: 02/03/2004    Years since quitting: 13.9   . Smokeless tobacco: Never Used  Substance and Sexual Activity  . Alcohol use: Yes    Alcohol/week: 1.0 standard drinks    Types: 1 Glasses of wine per week  . Drug use: No  . Sexual activity: Yes    Partners: Male    Birth control/protection: Surgical    Comment: vasectomy  Lifestyle  . Physical activity:  Days per week: Not on file    Minutes per session: Not on file  . Stress: Not on file  Relationships  . Social connections:    Talks on phone: Not on file    Gets together: Not on file    Attends religious service: Not on file    Active member of club or organization: Not on file    Attends meetings of clubs or organizations: Not on file    Relationship status: Not on file  . Intimate partner violence:    Fear of current or ex partner: Not on file    Emotionally abused: Not on file    Physically abused: Not on file    Forced sexual activity: Not on file  Other Topics Concern  . Not on file  Social History Narrative   Patient lives at home with her husband and children. Patient works for the [employer] in [place]. College education.      PHYSICAL EXAM  Vitals:   01/15/18 1401  BP: (!) 149/81  Pulse: 64  Weight: 218 lb 12 oz (99.2 kg)  Height: 5\' 3"  (1.6 m)   Body mass index is 38.75 kg/m.  Generalized: Well developed, obese female in no acute distress  Head: normocephalic and atraumatic,. Oropharynx benign  Neck: Supple, no carotid bruits  Cardiac: Regular rate rhythm, no murmur  Musculoskeletal: No deformity   Neurological examination   Mentation: Alert oriented to time, place, history taking. Attention span and concentration appropriate. Recent and remote memory intact.  Follows all commands speech and language fluent.   Cranial nerve II-XII: Fundoscopic exam reveals sharp disc margins.Pupils were equal round reactive to light extraocular movements were full, visual field were full on confrontational test. Facial sensation and strength were normal.  hearing was intact to finger rubbing bilaterally. Uvula tongue midline. head turning and shoulder shrug were normal and symmetric.Tongue protrusion into cheek strength was normal. Motor: She has mild spasticity of bilateral lower extremities, mild bilateral hip flexion weakness Sensory: normal and symmetric to light touch, pinprick, and  Vibration, proprioception in upper and lower extremities Coordination: finger-nose-finger, heel-to-shin bilaterally, no dysmetria Reflexes: Brachioradialis 2/2, biceps 2/2, triceps 2/2, patellar 2/2, Achilles 2/2, plantar responses were flexor bilaterally. Gait and Station: Getting up from seated position pushed on chair arm, cautious, mildly unsteady, dragging left leg more than right, the past  DIAGNOSTIC DATA (LABS, IMAGING, TESTING) - I reviewed patient records, labs, notes, testing and imaging myself where available.  Lab Results  Component Value Date   WBC 10.7 11/30/2015   HGB 13.9 11/30/2015   HCT 40.9 11/30/2015   MCV 89 11/30/2015   PLT 294 11/30/2015      Component Value Date/Time   NA 143 11/30/2015 1626   K 4.7 11/30/2015 1626   CL 100 11/30/2015 1626   CO2 29 11/30/2015 1626   GLUCOSE 96 11/30/2015 1626   GLUCOSE 145 (H) 04/11/2015 0537   BUN 17 11/30/2015 1626   CREATININE 0.74 11/30/2015 1626   CREATININE 0.65 06/02/2013 1640   CALCIUM 9.6 11/30/2015 1626   PROT 6.7 08/09/2015 0854   PROT 6.9 01/18/2015 0000   ALBUMIN 4.1 08/09/2015 0854   ALBUMIN 4.3 01/18/2015 0000   AST 25 08/09/2015 0854   ALT 25 08/09/2015 0854   ALKPHOS 66 08/09/2015 0854   BILITOT 0.5 08/09/2015 0854   BILITOT 0.4 01/18/2015 0000   GFRNONAA 95 11/30/2015 1626   GFRAA 110 11/30/2015 1626   Tysarbri infusion since Feb 2013 JC-virus antibody  negative (July 2014), Negative 0.17 in 11/11/2013, 07/07/2014 JCV 0.19 negative. -0.13 in September 2018  ASSESSMENT AND PLAN  52 y.o. year old female   Relapsing remitting multiple sclerosis  Tysarbri  infusion since February 2013  Repeat MRI brain, cervical, thoracic w/wo   progressive worsening gait abnormality, lower extremity back muscle spasm  I have suggested her to have shingles vaccination,  Add on tizanidine 2 mg 3 times a day for lower extremity spasticity.  Restless leg syndrome  She has the urge to move her legs at evening and nighttime,  Continue gabapentin 300 mg 2 tablets 3 times a day  Depression:  Continue Effexor ER 75 mg every day  Facial  dystonia, muscle spasm  EMG guided xeomin injection, used 100 units total  Right masseters 37.5 units Right temporalis 25 units  Left masseters 12.5 units Left temporalis 25 units  Marcial Pacas, M.D. Ph.D.  [facility] [place], [place] [address] Phone: [phone] Fax:      [phone]

## 2018-01-15 NOTE — Progress Notes (Signed)
**  Xeomin 100 units x 1 vial, NDC 3685-9923-41, Lot [id number], Exp 04/2019, specialty pharmacy.//mck,rn**

## 2018-01-16 ENCOUNTER — Telehealth: Payer: Self-pay | Admitting: Neurology

## 2018-01-16 LAB — COMPREHENSIVE METABOLIC PANEL
ALT: 64 IU/L — ABNORMAL HIGH (ref 0–32)
AST: 50 IU/L — ABNORMAL HIGH (ref 0–40)
Albumin/Globulin Ratio: 2.4 — ABNORMAL HIGH (ref 1.2–2.2)
Albumin: 5.1 g/dL (ref 3.5–5.5)
Alkaline Phosphatase: 57 IU/L (ref 39–117)
BUN/Creatinine Ratio: 15 (ref 9–23)
BUN: 9 mg/dL (ref 6–24)
Bilirubin Total: 0.5 mg/dL (ref 0.0–1.2)
CO2: 21 mmol/L (ref 20–29)
Calcium: 9.6 mg/dL (ref 8.7–10.2)
Chloride: 101 mmol/L (ref 96–106)
Creatinine, Ser: 0.59 mg/dL (ref 0.57–1.00)
GFR calc Af Amer: 123 mL/min/{1.73_m2} (ref >59)
GFR calc non Af Amer: 106 mL/min/{1.73_m2} (ref >59)
Globulin, Total: 2.1 g/dL (ref 1.5–4.5)
Glucose: 149 mg/dL — ABNORMAL HIGH (ref 65–99)
Potassium: 3.9 mmol/L (ref 3.5–5.2)
Sodium: 140 mmol/L (ref 134–144)
Total Protein: 7.2 g/dL (ref 6.0–8.5)

## 2018-01-16 LAB — CBC WITH DIFFERENTIAL/PLATELET
Basophils Absolute: 0.1 10*3/uL (ref 0.0–0.2)
Basos: 1 %
EOS (ABSOLUTE): 0.3 10*3/uL (ref 0.0–0.4)
Eos: 3 %
Hematocrit: 38.7 % (ref 34.0–46.6)
Hemoglobin: 13.3 g/dL (ref 11.1–15.9)
Immature Grans (Abs): 0 10*3/uL (ref 0.0–0.1)
Immature Granulocytes: 0 %
Lymphocytes Absolute: 5.1 10*3/uL — ABNORMAL HIGH (ref 0.7–3.1)
Lymphs: 50 %
MCH: 29.8 pg (ref 26.6–33.0)
MCHC: 34.4 g/dL (ref 31.5–35.7)
MCV: 87 fL (ref 79–97)
Monocytes Absolute: 0.7 10*3/uL (ref 0.1–0.9)
Monocytes: 7 %
Neutrophils Absolute: 3.8 10*3/uL (ref 1.4–7.0)
Neutrophils: 39 %
Platelets: 299 10*3/uL (ref 150–450)
RBC: 4.46 x10E6/uL (ref 3.77–5.28)
RDW: 13.4 % (ref 12.3–15.4)
WBC: 9.9 10*3/uL (ref 3.4–10.8)

## 2018-01-16 NOTE — Telephone Encounter (Signed)
[place]: [phone], [phone] & [phone] (exp. 01/16/18 to 03/02/18  [employer] Auth: [id number] (exp. 01/16/18 to 02/14/18)  Orders sent to [facility] for pt to be aware of this and left their number of [phone] and to call them if she has not heard in the next 2-3 business days.

## 2018-01-16 NOTE — Telephone Encounter (Signed)
Order Providers   Prescribing Provider Encounter Provider  Lorretta Harp, MD Lorretta Harp, MD  Outpatient Medication Detail    Disp Refills Start End   lisinopril (PRINIVIL,ZESTRIL) 5 MG tablet 30 tablet 0 01/08/2018    Sig - Route: TAKE 1 TABLET (5 MG TOTAL) BY MOUTH DAILY. PLEASE CALL TO SCHEDULE APPT FOR FURTHER REFILLS. - Oral   Sent to pharmacy as: lisinopril (PRINIVIL,ZESTRIL) 5 MG tablet   E-Prescribing Status: Receipt confirmed by pharmacy (01/08/2018 12:15 PM EDT)   Pharmacy   [id number] - [place], [place]

## 2018-01-21 DIAGNOSIS — G35 Multiple sclerosis: Secondary | ICD-10-CM | POA: Diagnosis not present

## 2018-01-22 ENCOUNTER — Telehealth: Payer: Self-pay | Admitting: *Deleted

## 2018-01-22 NOTE — Telephone Encounter (Signed)
Lab collected 01/15/18:  JCV - 0.13 (negative)

## 2018-01-24 ENCOUNTER — Other Ambulatory Visit: Payer: Self-pay | Admitting: Cardiovascular Disease

## 2018-01-24 NOTE — Telephone Encounter (Signed)
Rx request sent to pharmacy.  

## 2018-02-05 DIAGNOSIS — G35 Multiple sclerosis: Secondary | ICD-10-CM | POA: Diagnosis not present

## 2018-02-21 ENCOUNTER — Other Ambulatory Visit: Payer: Self-pay | Admitting: Cardiovascular Disease

## 2018-03-02 IMAGING — XA DG MYELOGRAPHY LUMBAR INJ LUMBOSACRAL
13 of 18 series · 13 of 18 positions shown · non-contrast
Comparison: CT abdomen and pelvis 12/08/2013

CLINICAL DATA: Low back pain and lateral right lower extremity
pain.
TECHNIQUE: Contiguous axial images were obtained through the Lumbar spine after
the intrathecal infusion of infusion. Coronal and sagittal
reconstructions were obtained of the axial image sets.

[Series 1: w lumbar spine lat · 0.15mm/px · 1 of 1 slices shown]
[im 1/1]
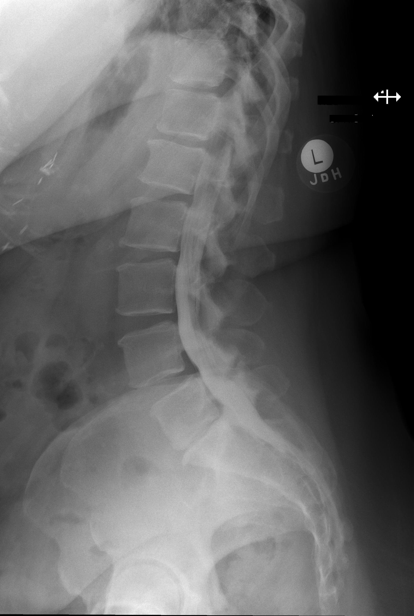

[Series 2: w lumbar spine flexion · 0.15mm/px · 1 of 1 slices shown]
[im 1/1]
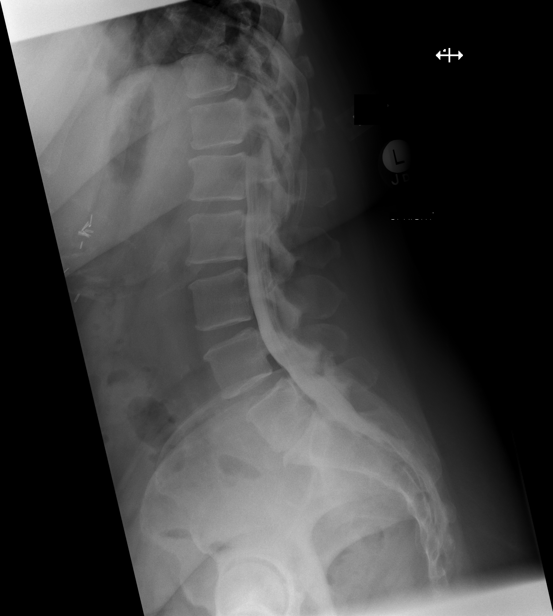

[Series 2: vasc adipose · 1 of 1 slices shown (1 of 11)]
[im 1/1]
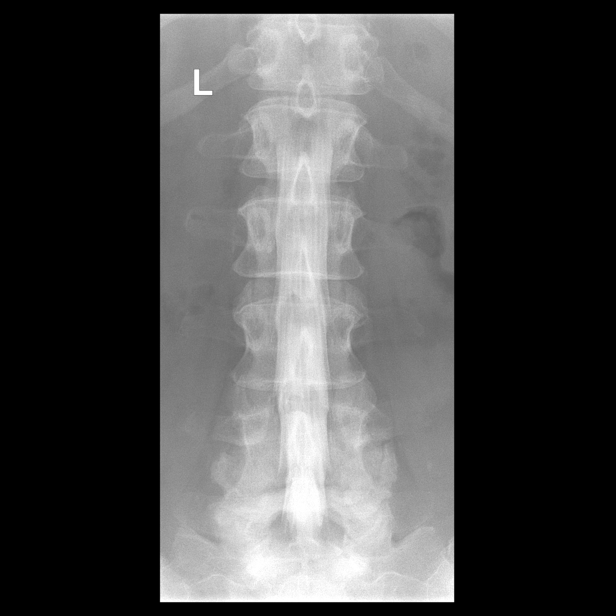

[Series 3: vasc adipose · 1 of 1 slices shown (2 of 11)]
[im 1/1]
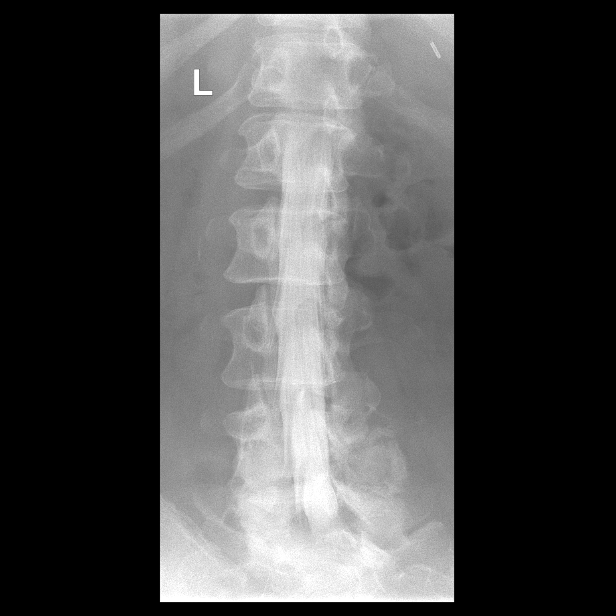

[Series 4: vasc adipose · 1 of 1 slices shown (3 of 11)]
[im 1/1]
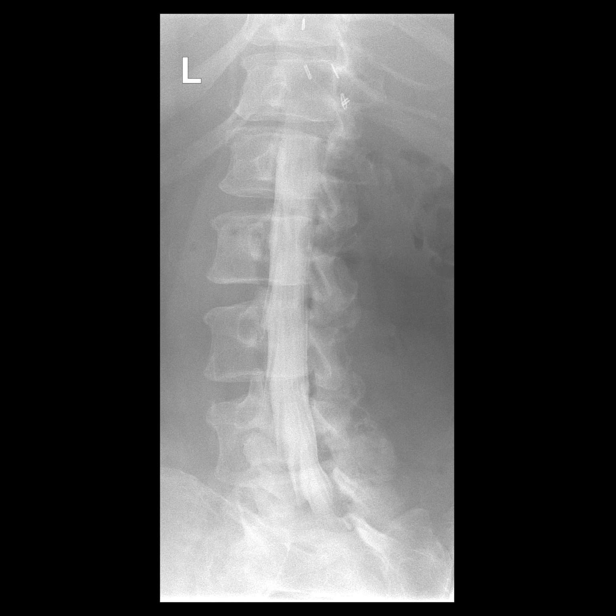

[Series 5: vasc adipose · 1 of 1 slices shown (4 of 11)]
[im 1/1]
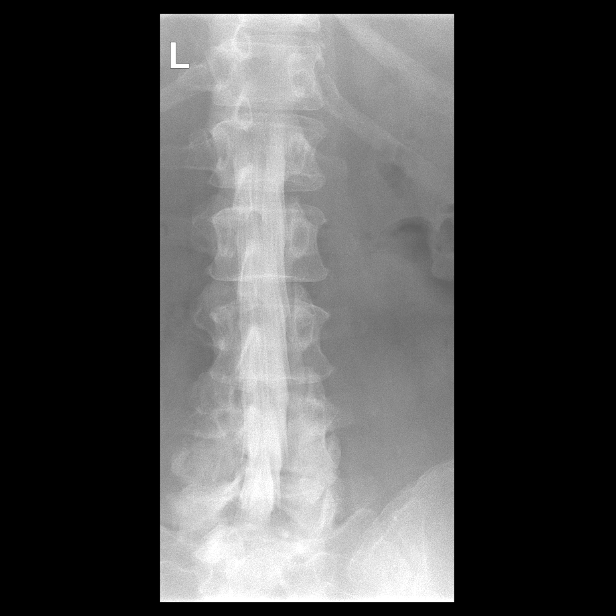

[Series 7: vasc adipose · 1 of 1 slices shown (5 of 11)]
[im 1/1]
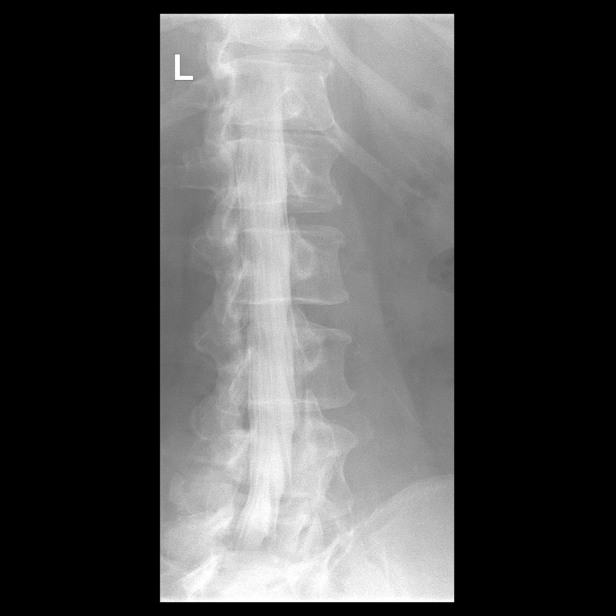

[Series 8: vasc adipose · 1 of 1 slices shown (6 of 11)]
[im 1/1]
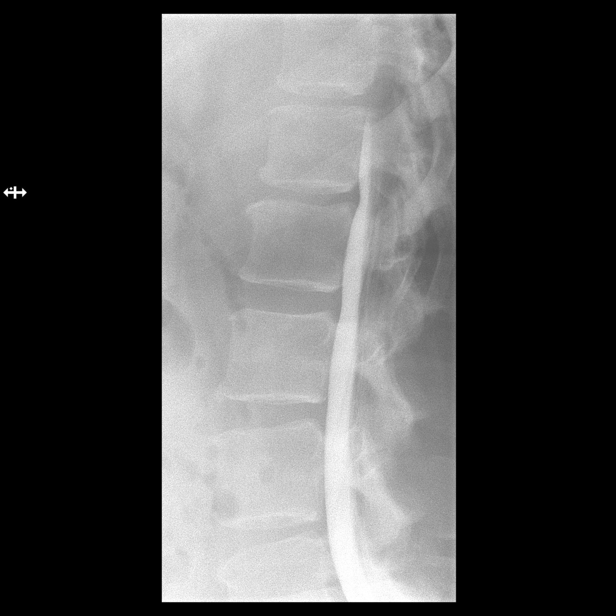

[Series 9: vasc adipose · 1 of 1 slices shown (7 of 11)]
[im 1/1]
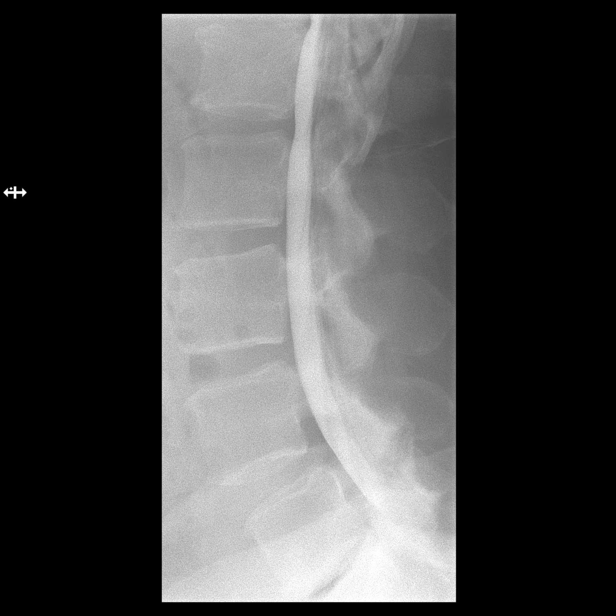

[Series 11: vasc adipose · 1 of 1 slices shown (8 of 11)]
[im 1/1]
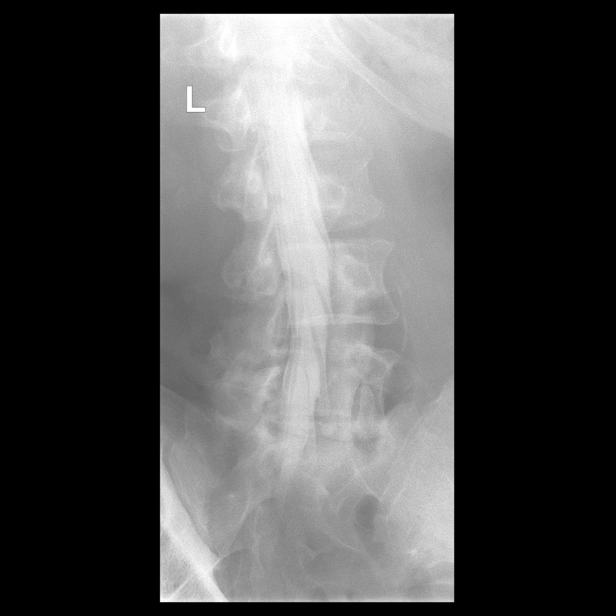

[Series 12: vasc adipose · 1 of 1 slices shown (9 of 11)]
[im 1/1]
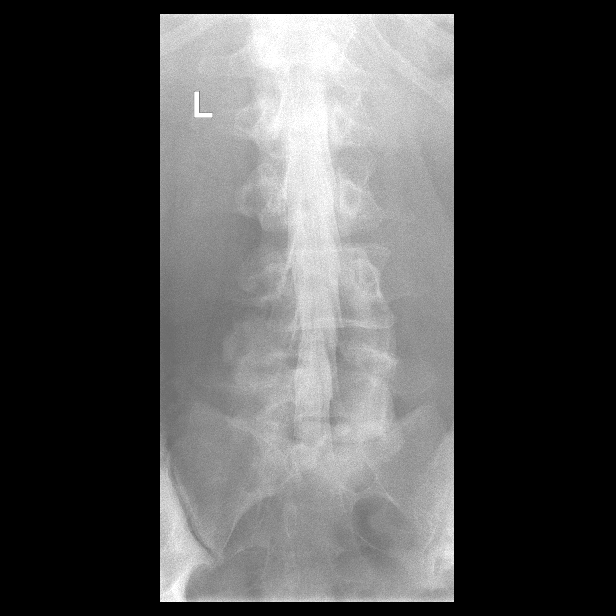

[Series 13: vasc adipose · 1 of 1 slices shown (10 of 11)]
[im 1/1]
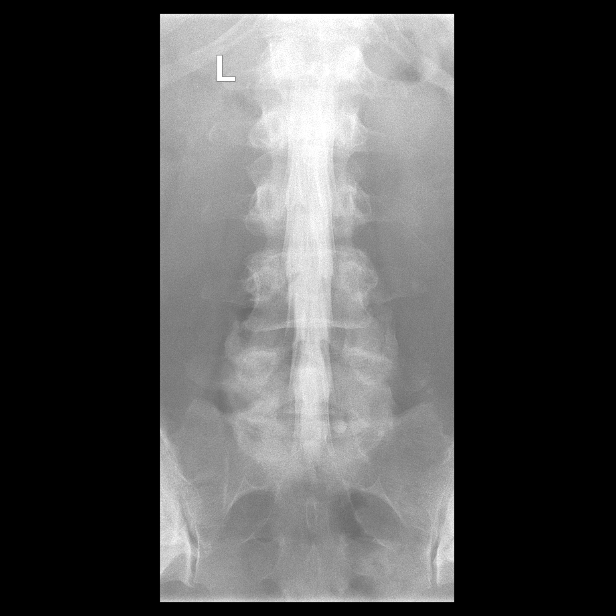

[Series 15: vasc adipose · 1 of 1 slices shown (11 of 11)]
[im 1/1]
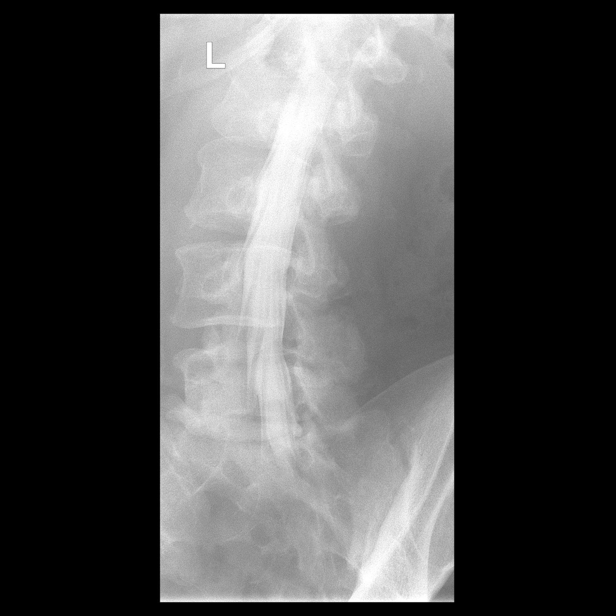

[13 of 18 positions shown; findings below may reference images not displayed]

EXAM:
LUMBAR MYELOGRAM

FLUOROSCOPY TIME:  Radiation Exposure Index (as provided by the
fluoroscopic device): 267.26 microGray*m^2

Fluoroscopy Time (in minutes and seconds):  5 seconds

PROCEDURE:
After thorough discussion of risks and benefits of the procedure
including bleeding, infection, injury to nerves, blood vessels,
adjacent structures as well as headache and CSF leak, written and
oral informed consent was obtained. Consent was obtained by Dr.
Benrabah Etoil. Time out form was completed.

Patient was positioned prone on the fluoroscopy table. Local
anesthesia was provided with 1% lidocaine without epinephrine after
prepped and draped in the usual sterile fashion. Puncture was
performed at L3-4 using a 5 inch 22-gauge spinal needle via a left
interlaminar approach. Using a single pass through the dura, the
needle was placed within the thecal sac, with return of clear CSF.
15 mL of Isovue I-QHH was injected into the thecal sac, with normal
opacification of the nerve roots and cauda equina consistent with
free flow within the subarachnoid space.

I personally performed the lumbar puncture and administered the
intrathecal contrast. I also personally supervised acquisition of
the myelogram images.
FINDINGS: LUMBAR MYELOGRAM FINDINGS:

There are 5 non rib-bearing lumbar type vertebrae. Grade 1
anterolisthesis of L4 on L5 does not significantly change with
standing, flexion, or extension. There are at most shallow ventral
extradural defects at L4-5 and L5-S1 without evidence of significant
spinal stenosis. No nerve root cut off is identified.

CT LUMBAR MYELOGRAM FINDINGS:

Grade 1 anterolisthesis of L4 on L5 has increased from 1585 due to
progressive facet arthrosis and now measures 4 mm. No fracture or
suspicious osseous lesion is identified. The conus medullaris
terminates at L1-2. Aortic atherosclerosis is noted without
aneurysm.

T12-L1: Diminutive central disc protrusion without stenosis.

L1-2: Negative.

L2-3: Mild facet arthrosis without disc herniation or stenosis.

L3-4: Mild facet arthrosis without disc herniation or stenosis.

L4-5: Anterolisthesis with mild bulging of uncovered disc and severe
facet arthrosis result in mild bilateral neural foraminal stenosis
without spinal stenosis pre

L5-S1: Moderate disc space narrowing, degenerative endplate
sclerosis, and vacuum disc. Circumferential disc bulging, left
foraminal endplate spurring, and disc space height loss result in
mild right and moderate to severe left neural foraminal stenosis,
potentially affecting the exiting left L5 nerve root. No spinal
stenosis.
IMPRESSION: 1. Severe L4-5 facet arthrosis, progressed from 1585 with associated
grade 1 anterolisthesis and mild bilateral neural foraminal
stenosis.
2. Advanced disc degeneration at L5-S1 resulting in mild right and
moderate to severe left neural foraminal stenosis.
3.  Aortic Atherosclerosis ([id number]).

## 2018-03-12 ENCOUNTER — Other Ambulatory Visit: Payer: Self-pay | Admitting: Cardiovascular Disease

## 2018-03-14 ENCOUNTER — Other Ambulatory Visit: Payer: Self-pay | Admitting: Cardiovascular Disease

## 2018-04-09 ENCOUNTER — Other Ambulatory Visit: Payer: Self-pay | Admitting: Cardiovascular Disease

## 2018-04-11 ENCOUNTER — Other Ambulatory Visit: Payer: Self-pay | Admitting: Neurology

## 2018-04-14 ENCOUNTER — Other Ambulatory Visit: Payer: Self-pay | Admitting: Cardiovascular Disease

## 2018-04-16 ENCOUNTER — Ambulatory Visit: Payer: Self-pay | Admitting: Neurology

## 2018-04-19 ENCOUNTER — Other Ambulatory Visit: Payer: Self-pay | Admitting: Cardiovascular Disease

## 2018-05-05 ENCOUNTER — Other Ambulatory Visit: Payer: Self-pay | Admitting: Cardiovascular Disease

## 2018-05-08 DIAGNOSIS — G35 Multiple sclerosis: Secondary | ICD-10-CM | POA: Diagnosis not present

## 2018-05-13 ENCOUNTER — Other Ambulatory Visit: Payer: Self-pay | Admitting: Cardiovascular Disease

## 2018-05-13 NOTE — Telephone Encounter (Signed)
°*  STAT* If patient is at the pharmacy, call can be transferred to refill team.   1. Which medications need to be refilled? (please list name of each medication and dose if known) metoprolol 25 mg twice a day  2. Which pharmacy/location (including street and city if local pharmacy) is medication to be sent to? [facility] [place]  3. Do they need a 30 day or 90 day supply? [place]

## 2018-05-14 MED ORDER — METOPROLOL TARTRATE 25 MG PO TABS: 25.0000 mg | ORAL_TABLET | Freq: Two times a day (BID) | ORAL | 0 refills | Status: DC

## 2018-05-14 NOTE — Telephone Encounter (Signed)
Rx(s) sent to pharmacy electronically.  

## 2018-05-16 ENCOUNTER — Ambulatory Visit: Payer: BLUE CROSS/BLUE SHIELD | Admitting: Cardiovascular Disease

## 2018-05-30 ENCOUNTER — Ambulatory Visit: Payer: BLUE CROSS/BLUE SHIELD | Admitting: Cardiovascular Disease

## 2018-05-30 ENCOUNTER — Encounter: Payer: Self-pay | Admitting: Cardiovascular Disease

## 2018-05-30 DIAGNOSIS — Z951 Presence of aortocoronary bypass graft: Secondary | ICD-10-CM | POA: Diagnosis not present

## 2018-05-30 DIAGNOSIS — E785 Hyperlipidemia, unspecified: Secondary | ICD-10-CM

## 2018-05-30 LAB — LIPID PANEL
Chol/HDL Ratio: 3.6 ratio (ref 0.0–4.4)
Cholesterol, Total: 109 mg/dL (ref 100–199)
HDL: 30 mg/dL — ABNORMAL LOW (ref >39)
LDL Calculated: 58 mg/dL (ref 0–99)
Triglycerides: 106 mg/dL (ref 0–149)
VLDL Cholesterol Cal: 21 mg/dL (ref 5–40)

## 2018-05-30 LAB — HEPATIC FUNCTION PANEL
ALT: 61 IU/L — ABNORMAL HIGH (ref 0–32)
AST: 41 IU/L — ABNORMAL HIGH (ref 0–40)
Albumin: 4.5 g/dL (ref 3.8–4.9)
Alkaline Phosphatase: 68 IU/L (ref 39–117)
Bilirubin Total: 0.4 mg/dL (ref 0.0–1.2)
Bilirubin, Direct: 0.12 mg/dL (ref 0.00–0.40)
Total Protein: 6.8 g/dL (ref 6.0–8.5)

## 2018-05-30 MED ORDER — ATORVASTATIN CALCIUM 80 MG PO TABS: ORAL_TABLET | ORAL | 3 refills | Status: DC

## 2018-05-30 MED ORDER — LISINOPRIL 5 MG PO TABS: 5.0000 mg | ORAL_TABLET | Freq: Every day | ORAL | 3 refills | Status: DC

## 2018-05-30 MED ORDER — METOPROLOL TARTRATE 25 MG PO TABS: 25.0000 mg | ORAL_TABLET | Freq: Two times a day (BID) | ORAL | 3 refills | Status: DC

## 2018-05-30 NOTE — Assessment & Plan Note (Signed)
History of CAD post non-STEMI 04/05/2015 with cath performed by Dr. Claiborne Billings revealing a 95% ostial LAD lesion.  2 days later she underwent off-pump LIMA placement by Dr. Roxy Manns .  She is done well since then denies chest pain.

## 2018-05-30 NOTE — Assessment & Plan Note (Signed)
History of dyslipidemia on statin therapy.  We will check a lipid and liver profile today.

## 2018-05-30 NOTE — Patient Instructions (Signed)
Medication Instructions:  Your physician recommends that you continue on your current medications as directed. Please refer to the Current Medication list given to you today.  If you need a refill on your cardiac medications before your next appointment, please call your pharmacy.   Lab work: Your physician recommends that you HAVE lab work done TODAY: Lipid and Liver Panels  If you have labs (blood work) drawn today and your tests are completely normal, you will receive your results only by: Marland Kitchen MyChart Message (if you have MyChart) OR . A paper copy in the mail If you have any lab test that is abnormal or we need to change your treatment, we will call you to review the results.  Testing/Procedures: NONE  Follow-Up: At [facility], you and your health needs are our priority.  As part of our continuing mission to provide you with exceptional heart care, we have created designated Provider Care Teams.  These Care Teams include your primary Cardiologist (physician) and Advanced Practice Providers (APPs -  Physician Assistants and Nurse Practitioners) who all work together to provide you with the care you need, when you need it. . You will need a follow up appointment in 12 months.  Please call our office 2 months in advance to schedule this appointment.  You may see Dr. Gwenlyn Found or one of the following Advanced Practice Providers on your designated Care Team:   . Kerin Ransom, [place] . Almyra Deforest, PA-C . Fabian Sharp, PA-C . Jory Sims, DNP . Rosaria Ferries, PA-C . Roby Lofts, PA-C . Sande Rives, PA-C

## 2018-05-30 NOTE — Progress Notes (Signed)
05/30/2018 Kaylee Bryant   05-18-1965  [id number]  Primary Physician Maurice Small, MD Primary Cardiologist: Lorretta Harp MD [name], [place]  HPI:  Kaylee Bryant is a 53 y.o.  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 I last saw her in the office 11/13/2016. She has a history of multiple sclerosis the past as well as diabetes and remote tobacco abuse having quit in 2005. She had a non-STEMI on 04/05/15 with unstable angina. She is catheterized by Dr. Claiborne Billings revealing a 95% ostial LAD lesion and 2 days later underwent off-pump LIMA placement by Dr. Roxy Manns. She participated in cardiac rehabilitation briefly.  Since I saw her a year and a half ago she is done well.  She denies chest pain or shortness of breath.   Current Meds  Medication Sig  . acetaminophen (TYLENOL) 500 MG tablet Take 1,000 mg by mouth every 6 (six) hours as needed for mild pain.  Marland Kitchen aspirin EC 81 MG tablet Take 81 mg by mouth daily.  Marland Kitchen atorvastatin (LIPITOR) 80 MG tablet TAKE 1 TABLET (80 MG TOTAL) BY MOUTH DAILY AT 6 PM. NEEDS OFFICE VISIT  . baclofen (LIORESAL) 10 MG tablet Take 2 tablets (20 mg total) by mouth 4 (four) times daily.  Marland Kitchen docusate sodium (COLACE) 100 MG capsule Take 100 mg by mouth 2 (two) times daily.  Marland Kitchen gabapentin (NEURONTIN) 300 MG capsule TAKE 2 CAPSULES (600 MG TOTAL) BY MOUTH 3 (THREE) TIMES DAILY.  Marland Kitchen glipiZIDE (GLUCOTROL XL) 5 MG 24 hr tablet Take 10 mg by mouth daily. with food  . lisinopril (PRINIVIL,ZESTRIL) 5 MG tablet TAKE 1 TABLET (5 MG TOTAL) BY MOUTH DAILY. PLEASE CALL TO SCHEDULE APPT FOR FURTHER REFILLS.  Marland Kitchen lisinopril (PRINIVIL,ZESTRIL) 5 MG tablet TAKE 1 TABLET (5 MG TOTAL) BY MOUTH DAILY. PLEASE CALL TO SCHEDULE APPT FOR FURTHER REFILLS.  . metFORMIN (GLUCOPHAGE-XR) 500 MG 24 hr tablet Take 1,000 mg by mouth 2 (two) times daily.   . metoprolol tartrate (LOPRESSOR) 25 MG tablet Take 1 tablet (25 mg total) by mouth 2 (two) times  daily. MUST KEEP APPOINTMENT 05/30/18 WITH DR BERRY FOR FUTURE REFILLS  . natalizumab (TYSABRI) 300 MG/15ML injection Inject 15 mLs (300 mg total) into the vein every 30 (thirty) days.  Marland Kitchen tiZANidine (ZANAFLEX) 2 MG tablet TAKE 1 TABLET (2 MG TOTAL) BY MOUTH 3 (THREE) TIMES DAILY.  Marland Kitchen venlafaxine XR (EFFEXOR-XR) 75 MG 24 hr capsule TAKE 1 CAPSULE (75 MG TOTAL) BY MOUTH DAILY WITH BREAKFAST.     No Known Allergies  Social History   Socioeconomic History  . Marital status: Married    Spouse name: Not on file  . Number of children: 4  . Years of education: college  . Highest education level: Not on file  Occupational History  . Occupation: Programmer, multimedia: [employer]  . Financial resource strain: Not on file  . Food insecurity:    Worry: Not on file    Inability: Not on file  . Transportation needs:    Medical: Not on file    Non-medical: Not on file  Tobacco Use  . Smoking status: Former Smoker    Packs/day: 1.50    Years: 15.00    Pack years: 22.50    Types: Cigarettes    Last attempt to quit: 02/03/2004    Years since quitting: 14.3  . Smokeless tobacco: Never Used  Substance and Sexual Activity  . Alcohol use: Yes    Alcohol/week: 1.0 standard drinks    Types: 1 Glasses of wine per week  . Drug use: No  . Sexual activity: Yes    Partners: Male    Birth control/protection: Surgical    Comment: vasectomy  Lifestyle  . Physical activity:    Days per week: Not on file    Minutes per session: Not on file  . Stress: Not on file  Relationships  . Social connections:    Talks on phone: Not on file    Gets together: Not on file    Attends religious service: Not on file    Active member of club or organization: Not on file    Attends meetings of clubs or organizations: Not on file    Relationship status: Not on file  . Intimate partner violence:    Fear of current or ex partner: Not on file    Emotionally abused: Not on file    Physically  abused: Not on file    Forced sexual activity: Not on file  Other Topics Concern  . Not on file  Social History Narrative   Patient lives at home with her husband and children. Patient works for the [employer] in [place]. College education.      Review of Systems: General: negative for chills, fever, night sweats or weight changes.  Cardiovascular: negative for chest pain, dyspnea on exertion, edema, orthopnea, palpitations, paroxysmal nocturnal dyspnea or shortness of breath Dermatological: negative for rash Respiratory: negative for cough or wheezing Urologic: negative for hematuria Abdominal: negative for nausea, vomiting, diarrhea, bright red blood per rectum, melena, or hematemesis Neurologic: negative for visual changes, syncope, or dizziness All other systems reviewed and are otherwise negative except as noted above.    Blood pressure 116/70, pulse (!) 55, height 5\' 3"  (1.6 m), weight 217 lb (98.4 kg), last menstrual period 10/03/2014.  General appearance: alert and no distress Neck: no adenopathy, no carotid bruit, no JVD, supple, symmetrical, trachea midline and thyroid not enlarged, symmetric, no tenderness/mass/nodules Lungs: clear to auscultation bilaterally Heart: regular rate and rhythm, S1, S2 normal, no murmur, click, rub or gallop Extremities: extremities normal, atraumatic, no cyanosis or edema Pulses: 2+ and symmetric Skin: Skin color, texture, turgor normal. No rashes or lesions Neurologic: Alert and oriented X 3, normal strength and tone. Normal symmetric reflexes. Normal coordination and gait  EKG sinus bradycardia 55 without ST or T wave changes.  Personally reviewed this EKG.  There was poor R wave progression.  ASSESSMENT AND PLAN:   S/P CABG x 1 History of CAD post non-STEMI 04/05/2015 with cath performed by Dr. Claiborne Billings revealing a 95% ostial LAD lesion.  2 days later she underwent off-pump LIMA placement by Dr. Roxy Manns .  She is done well since then denies chest  pain.  Dyslipidemia History of dyslipidemia on statin therapy.  We will check a lipid and liver profile today.      Lorretta Harp MD FACP,FACC,FAHA, [place] 05/30/2018 7:55 AM

## 2018-06-05 DIAGNOSIS — G35 Multiple sclerosis: Secondary | ICD-10-CM | POA: Diagnosis not present

## 2018-07-12 ENCOUNTER — Other Ambulatory Visit: Payer: Self-pay | Admitting: Neurology

## 2018-07-30 DIAGNOSIS — G35 Multiple sclerosis: Secondary | ICD-10-CM | POA: Diagnosis not present

## 2018-08-28 DIAGNOSIS — G35 Multiple sclerosis: Secondary | ICD-10-CM | POA: Diagnosis not present

## 2018-09-23 ENCOUNTER — Other Ambulatory Visit: Payer: Self-pay | Admitting: Neurology

## 2018-09-25 DIAGNOSIS — G35 Multiple sclerosis: Secondary | ICD-10-CM | POA: Diagnosis not present

## 2018-10-23 DIAGNOSIS — G35 Multiple sclerosis: Secondary | ICD-10-CM | POA: Diagnosis not present

## 2018-10-26 ENCOUNTER — Other Ambulatory Visit: Payer: Self-pay | Admitting: Neurology

## 2018-12-18 DIAGNOSIS — G35 Multiple sclerosis: Secondary | ICD-10-CM | POA: Diagnosis not present

## 2018-12-24 ENCOUNTER — Other Ambulatory Visit: Payer: Self-pay | Admitting: Cardiovascular Disease

## 2019-01-14 ENCOUNTER — Other Ambulatory Visit: Payer: 59

## 2019-01-15 ENCOUNTER — Telehealth: Payer: Self-pay | Admitting: *Deleted

## 2019-01-15 ENCOUNTER — Other Ambulatory Visit: Payer: Self-pay | Admitting: *Deleted

## 2019-01-15 DIAGNOSIS — Z79899 Other long term (current) drug therapy: Secondary | ICD-10-CM

## 2019-01-15 DIAGNOSIS — G35 Multiple sclerosis: Secondary | ICD-10-CM | POA: Diagnosis not present

## 2019-01-15 DIAGNOSIS — M545 Low back pain, unspecified: Secondary | ICD-10-CM

## 2019-01-15 NOTE — Telephone Encounter (Signed)
The patient was in our office today for her Tysabri infusion.  Per vo by Dr. Krista Blue, she has authorized the following:  Labs: 1) CBC w/ Diff 2) CMP 3) JCV  MRI scans: 1) brain, cervical, thoracic w/wo (MS) 2) lumbar w/o (low back pain)  The patient has also been scheduled for a follow up appt.

## 2019-01-16 LAB — CBC WITH DIFFERENTIAL/PLATELET
Basophils Absolute: 0.1 10*3/uL (ref 0.0–0.2)
Basos: 1 %
EOS (ABSOLUTE): 0.3 10*3/uL (ref 0.0–0.4)
Eos: 3 %
Hematocrit: 39.9 % (ref 34.0–46.6)
Hemoglobin: 13.7 g/dL (ref 11.1–15.9)
Immature Grans (Abs): 0 10*3/uL (ref 0.0–0.1)
Immature Granulocytes: 0 %
Lymphocytes Absolute: 4.6 10*3/uL — ABNORMAL HIGH (ref 0.7–3.1)
Lymphs: 49 %
MCH: 29.3 pg (ref 26.6–33.0)
MCHC: 34.3 g/dL (ref 31.5–35.7)
MCV: 85 fL (ref 79–97)
Monocytes Absolute: 0.6 10*3/uL (ref 0.1–0.9)
Monocytes: 6 %
NRBC: 1 % — ABNORMAL HIGH (ref 0–0)
Neutrophils Absolute: 3.8 10*3/uL (ref 1.4–7.0)
Neutrophils: 41 %
Platelets: 259 10*3/uL (ref 150–450)
RBC: 4.68 x10E6/uL (ref 3.77–5.28)
RDW: 13.3 % (ref 11.7–15.4)
WBC: 9.4 10*3/uL (ref 3.4–10.8)

## 2019-01-16 LAB — COMPREHENSIVE METABOLIC PANEL
ALT: 59 IU/L — ABNORMAL HIGH (ref 0–32)
AST: 30 IU/L (ref 0–40)
Albumin/Globulin Ratio: 1.7 (ref 1.2–2.2)
Albumin: 4.3 g/dL (ref 3.8–4.9)
Alkaline Phosphatase: 73 IU/L (ref 39–117)
BUN/Creatinine Ratio: 15 (ref 9–23)
BUN: 10 mg/dL (ref 6–24)
Bilirubin Total: 0.4 mg/dL (ref 0.0–1.2)
CO2: 23 mmol/L (ref 20–29)
Calcium: 9 mg/dL (ref 8.7–10.2)
Chloride: 102 mmol/L (ref 96–106)
Creatinine, Ser: 0.68 mg/dL (ref 0.57–1.00)
GFR calc Af Amer: 116 mL/min/{1.73_m2} (ref >59)
GFR calc non Af Amer: 101 mL/min/{1.73_m2} (ref >59)
Globulin, Total: 2.5 g/dL (ref 1.5–4.5)
Glucose: 278 mg/dL — ABNORMAL HIGH (ref 65–99)
Potassium: 4.7 mmol/L (ref 3.5–5.2)
Sodium: 139 mmol/L (ref 134–144)
Total Protein: 6.8 g/dL (ref 6.0–8.5)

## 2019-01-19 ENCOUNTER — Telehealth: Payer: Self-pay | Admitting: Neurology

## 2019-01-19 NOTE — Telephone Encounter (Signed)
Laboratory evaluations showed elevated glucose 278, mild abnormal liver functional test, elevated ALT 59, normal CBC.

## 2019-01-19 NOTE — Telephone Encounter (Signed)
I spoke to the patient and provided her with the lab results below.  She verbalized understanding.  She will keep her pending follow up appt.

## 2019-01-20 ENCOUNTER — Telehealth: Payer: Self-pay | Admitting: Neurology

## 2019-01-20 NOTE — Telephone Encounter (Signed)
I was able to get the MRI Brain, Cervical and Thoraicic approved because of her having MS. I was not able to get the Lumbar approved because I don't have any information on her since I don't have any recent office notes.  There is an option to do a peer to peer the phone number is [phone]. The member ID is [id number] & DOB 07/11/1965. The case does close on Thursday 01/22/19. It does not need to be scheduled they can do right when you call.

## 2019-01-20 NOTE — Telephone Encounter (Signed)
I met patient during her Tysabri infusion she complains of worsening low back pain, radiating pain to right lower extremity, increased gait abnormality  I have ordered MRI of lumbar for further evaluation,

## 2019-01-20 NOTE — Progress Notes (Signed)
Cancel MRI Lumbar

## 2019-01-20 NOTE — Telephone Encounter (Signed)
Should I call and withdraw the images because if you don't do the peer to peer then it'll get denied and it'll be awhile before we can redo the authorization.

## 2019-01-20 NOTE — Telephone Encounter (Signed)
I cancelled MRI lumbar

## 2019-01-21 NOTE — Telephone Encounter (Signed)
no to the covid questions MR Brain w/wo contrast, MR Cervical spine w/wo contrast & MR Thoracic spine w/wo contrast Dr. Willette Pa Auth: [id number] (exp. 01/20/19 to 02/18/19). Patient is scheduled at [facility] for 02/04/19.

## 2019-01-21 NOTE — Telephone Encounter (Signed)
Do you want the patient to proceed on having the MRI Brain, Cervical & Thoracic?

## 2019-01-21 NOTE — Telephone Encounter (Signed)
Yes proceed with MRI of brain, cervical and thoracic spines

## 2019-01-26 ENCOUNTER — Telehealth: Payer: Self-pay | Admitting: *Deleted

## 2019-01-26 NOTE — Telephone Encounter (Signed)
Labs collected 01/15/2019:  JCV 0.15 negative

## 2019-02-02 ENCOUNTER — Other Ambulatory Visit: Payer: Self-pay | Admitting: Neurology

## 2019-02-04 ENCOUNTER — Ambulatory Visit: Payer: BC Managed Care – PPO

## 2019-02-04 ENCOUNTER — Other Ambulatory Visit: Payer: Self-pay

## 2019-02-04 DIAGNOSIS — G35 Multiple sclerosis: Secondary | ICD-10-CM | POA: Diagnosis not present

## 2019-02-04 MED ORDER — GADOBENATE DIMEGLUMINE 529 MG/ML IV SOLN
20.0000 mL | Freq: Once | INTRAVENOUS | Status: AC | PRN
  Administered 2019-02-04: 20 mL via INTRAVENOUS

## 2019-02-11 ENCOUNTER — Other Ambulatory Visit: Payer: Self-pay

## 2019-02-11 ENCOUNTER — Ambulatory Visit: Payer: BC Managed Care – PPO | Admitting: Neurology

## 2019-02-11 ENCOUNTER — Encounter: Payer: Self-pay | Admitting: Neurology

## 2019-02-11 VITALS — BP 128/78 | HR 64 | Temp 97.1°F | Ht 63.0 in | Wt 199.0 lb

## 2019-02-11 DIAGNOSIS — M545 Low back pain, unspecified: Secondary | ICD-10-CM | POA: Insufficient documentation

## 2019-02-11 DIAGNOSIS — G35 Multiple sclerosis: Secondary | ICD-10-CM | POA: Diagnosis not present

## 2019-02-11 MED ORDER — BACLOFEN 10 MG PO TABS: 20.0000 mg | ORAL_TABLET | Freq: Three times a day (TID) | ORAL | 4 refills | Status: DC

## 2019-02-11 NOTE — Progress Notes (Signed)
Chief Complaint  Patient presents with   Multiple Sclerosis    She has continued Tysbari.  She would like to review her brain, cervical and thoracic MRI scans today.   Back Pain    She would like to have her lower back pain further evaluated.  Radiates down right leg.     [facility]  PATIENT: Kaylee Bryant DOB: [birth year]   REASON FOR VISIT: follow up for MS, relapsing remitting, fatigue abnormality of gait, back pain HISTORY FROM:patient    HISTORY OF PRESENT ILLNESS: HISTORY: She was diagnosed with RRMS in 2007, 6 month post partum, she had numbness from waist done, diagnosis was confirmed by marked abnormal MRI brain, in March,2007. There were multiple T2/Flair periventricular lesions, corpus callosum involvement, mutiple ring enhancing lesions. Also had enhancing lesion in cervical and thoracic spines. She was [facility] at [facility] by Dr. Dellis Filbert, no csf study, but had extensive lab evaluations to rule out mimics. 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 She denies visual changes or other clear relapsing episodes.  I have reviewed report MRI brain by Dr. Brandon Melnick in 01/2007, multiple T2/FLAIR lesions in both hemisphere with corpus callosum involvement, there are slight increase in lesion load. MRI cervical, stable lesions in C6-7, and C2-3. Thoracic, multiple non enhancing lesions.  She tolerated Rebif well, no flu like illness, but complains of bilateral arm, and the lateral thigh lipoatrophy with repeat Rebif injection. There was no clear relapsed over the years, but she has slow worsening gait difficulty over the past 6 months.  MRI in Sept 2012, MRI of thoracic spine Multiple chronic plaques at C7, T6-7, T8-9, T10.  MRI of cervical spine  ,Multiple chronic demyelinating plaques at C3, C4 and C7. MRI of brain Multiple round and ovioid, periventricular and subcortical chronic demyelinating plaques. 2 plaques noted in the pons. No abnormal lesions are seen on post contrast views.  Her JC-virus antibody was negative. After discuss, we started her on Tysarbri infusion since February of 2013. She tolerated the infusion very well, but complains of excessive fatigue over the past few months, she still able to work full-time as a Marine scientist, she complains of difficulty dragging herself out of bed. Modafinil was not approved by insurance.She was put on Ritalin 10mg  tid, which helped her fatigue. feels much better on Tysabri.  JC-virus antibody negative (July 2014), Negative 0.17 in 11/11/2013,   UPDATE December 16 2013: She is doing very well, her gait has improved, tolerating [name], Ritalin 10 mg tid. for her fatigue.JC-virus antibody negative 11/11/2013 .  Most recent MRI of the brain 10/21/2013 with multiple supra tentorial and infra-tentorial chronic demyelinating plaques. No acute plaques. No change from MRI 05/22/2013. She returns for reevaluation  UPDATE April 4th 2016: She works full time as Therapist, sports, s he has more dizziness spells, especially when she has to stand up, she has occasional headaches, She complains of intermittent sharp pain at her right lower thoracic region, X-ray showed DJD, excerbated by walking, Orthopedic evaluation was normal, including bilateral hip,  She is on Tysarbri,taking baclofen 10mg  tid, works well for her lower extremity She has no incotninence, if she stopped,   UPDATE July 26th 2016:She is now complainng of right side low back pain, radiating to right hip, right hip muscle cramping, sometimes difficulty getting  out of the bed, to put on her clothes, 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 She now noticed more fatigue, depression, also noticed mild worsening unsteady gait  I reviewed laboratory evaluations, ferritin 54, vitamin [id number], folic acid, iron level was normal, LDL 44, cholesterol 96, A1c 6.4, normal BMP, UA,  UPDATE Apr 08 2017: She has been receiving Tysarbri infusion, last one was in Jan 2019, she is overall doing very well, still has baseline mild gait abnormality, fatigue,  UPDATE July 10 2017: She came in today for EMG guided xeomin injection for frequent facial muscle spasm, sometimes to the point of affecting ability to form the word, open her mouth,  UPDATE October 16 2017: She reported MRI of lumbar recently by orthopedic surgeon Dr. Gladstone Lighter showed only mild degenerative changes, but increased upper back to lower back muscle spasm, despite higher dose of baclofen 80 mg daily, 20/40/20,  Over the years, she complains of progressive worsening gait abnormality.  She has been on Tysabri since February 2013.  She reported progressive lower extremity low back muscle spasm, urinary urgency, difficulty falling visual tract,  UPDATE Feb 11 2019: She complains of worsening bilateral lower extremity muscle spasm, gait abnormality, also worsening low back pain, she has taken 2 months off.  I personally reviewed CT lumbar spine myelogram in March 2019: Severe L4-5 facet arthrosis, with associated grade 1 anterior akinesis and mild bilateral neuroforaminal stenosis.  Advanced disc  degeneration at L5-S1, with mild right, moderate to severe left foraminal stenosis  She denies bowel and bladder issues, does complains of daytime fatigue, especially after her baclofen, gabapentin, tizanidine for muscle spasticity  We personally reviewed MRI of brain with without contrast November 2020: Multiple round and ovoid periventricular and subcortical, pontine chronic demyelinating plaque, MRI of cervical spine, chronic demyelinating plaque at C2, 3, 4, C6-7, also noted in the brainstem.  MRI of thoracic spine, chronic demyelinating plaque at C7, T6-7, T9, T10, T12-L1.  No evidence of contrast-enhancement  Laboratory evaluation showed CMP with elevated glucose 278, mild elevated ALT 59, CBC with hemoglobin of 13.7  REVIEW OF SYSTEMS: Full 14 system review of systems performed and notable only for those listed, all others are neg:  As above  ALLERGIES: No Known Allergies  HOME MEDICATIONS: Outpatient Medications Prior to Visit  Medication Sig Dispense Refill   acetaminophen (TYLENOL) 500 MG tablet Take 1,000 mg by mouth every 6 (six) hours as needed for mild pain.     aspirin EC 81 MG tablet Take 81 mg by mouth daily.     atorvastatin (LIPITOR) 80 MG tablet TAKE 1 TABLET (80 MG TOTAL) BY MOUTH DAILY AT 6 PM. 90 tablet 3   baclofen (LIORESAL) 10 MG tablet Take 2 tablets (20 mg total) by mouth 4 (four) times daily. 240 tablet 11   docusate sodium (COLACE)  100 MG capsule Take 100 mg by mouth 2 (two) times daily.     gabapentin (NEURONTIN) 300 MG capsule TAKE 2 CAPSULES (600 MG TOTAL) BY MOUTH 3 (THREE) TIMES DAILY. 540 capsule 3   glipiZIDE (GLUCOTROL XL) 5 MG 24 hr tablet Take 10 mg by mouth daily. with food  3   lisinopril (PRINIVIL,ZESTRIL) 5 MG tablet Take 1 tablet (5 mg total) by mouth daily. 90 tablet 3   metFORMIN (GLUCOPHAGE-XR) 500 MG 24 hr tablet Take 1,000 mg by mouth 2 (two) times daily.      metoprolol tartrate (LOPRESSOR) 25 MG tablet TAKE 1 TABLET BY MOUTH  TWICE A DAY 60 tablet 5   natalizumab (TYSABRI) 300 MG/15ML injection Inject 15 mLs (300 mg total) into the vein every 30 (thirty) days. 15 mL 11   tiZANidine (ZANAFLEX) 2 MG tablet TAKE 1 TABLET (2 MG TOTAL) BY MOUTH 3 (THREE) TIMES DAILY. 270 tablet 2   venlafaxine XR (EFFEXOR-XR) 75 MG 24 hr capsule TAKE 1 CAPSULE (75 MG TOTAL) BY MOUTH DAILY WITH BREAKFAST. 90 capsule 3   No facility-administered medications prior to visit.     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 ([place]) 04/05/2015   DDD (degenerative disc disease), lumbar    DM (diabetes mellitus), gestational 2006   insulin at the time   Fatigue    Heart attack ([place])    IBS (irritable bowel syndrome)    with chronic constipation   Multiple sclerosis ([place])    Obesity    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   LIMA to LAD off pump    PAST SURGICAL HISTORY: Past Surgical History:  Procedure Laterality Date   CARDIAC CATHETERIZATION N/A 04/05/2015   Procedure: Left Heart Cath and Coronary Angiography;  Surgeon: Troy Sine, MD;  Location: [facility];  Service: Cardiovascular;  Laterality: N/A;   CERVICAL CONE BIOPSY  2004   CESAREAN SECTION     1996, 2006   CHOLECYSTECTOMY     1988   COLPOSCOPY  2004   CORONARY ARTERY BYPASS GRAFT N/A 04/07/2015   Procedure: CORONARY ARTERY BYPASS GRAFTING (CABG), OFF PUMP, TIMES ONE, USING LEFT INTERNAL MAMMARY ARTERY;  Surgeon: Rexene Alberts, MD;  Location: [facility];  Service: Open Heart Surgery;  Laterality: N/A;  LIMA to LAD   TEE WITHOUT CARDIOVERSION N/A 04/07/2015   Procedure: TRANSESOPHAGEAL ECHOCARDIOGRAM (TEE);  Surgeon: Rexene Alberts, MD;  Location: [facility];  Service: Open Heart Surgery;  Laterality: N/A;    FAMILY HISTORY: Family History  Problem Relation Age of Onset   Diabetes Mother    Hypertension  Mother    Hypertension Father    Atrial fibrillation Father    Diabetes Sister    Breast cancer Maternal Aunt    Arrhythmia Sister    Heart attack Maternal Grandfather    Stroke Neg Hx     SOCIAL HISTORY: Social History   Socioeconomic History   Marital status: Married    Spouse name: Not on file   Number of children: 4   Years of education: college   Highest education level: Not on file  Occupational History   Occupation: Programmer, multimedia: [employer] resource strain: Not on file   Food insecurity    Worry: Not on file  Inability: Not on file   Transportation needs    Medical: Not on file    Non-medical: Not on file  Tobacco Use   Smoking status: Former Smoker    Packs/day: 1.50    Years: 15.00    Pack years: 22.50    Types: Cigarettes    Quit date: 02/03/2004    Years since quitting: 15.0   Smokeless tobacco: Never Used  Substance and Sexual Activity   Alcohol use: Yes    Alcohol/week: 1.0 standard drinks    Types: 1 Glasses of wine per week   Drug use: No   Sexual activity: Yes    Partners: Male    Birth control/protection: Surgical    Comment: vasectomy  Lifestyle   Physical activity    Days per week: Not on file    Minutes per session: Not on file   Stress: Not on file  Relationships   Social connections    Talks on phone: Not on file    Gets together: Not on file    Attends religious service: Not on file    Active member of club or organization: Not on file    Attends meetings of clubs or organizations: Not on file    Relationship status: Not on file   Intimate partner violence    Fear of current or ex partner: Not on file    Emotionally abused: Not on file    Physically abused: Not on file    Forced sexual activity: Not on file  Other Topics Concern   Not on file  Social History Narrative   Patient lives at home with her husband and children. Patient works for the [employer]  in [place]. College education.      PHYSICAL EXAM  Vitals:   02/11/19 1503  BP: 128/78  Pulse: 64  Temp: (!) 97.1 F (36.2 C)  Weight: 199 lb (90.3 kg)  Height: 5\' 3"  (1.6 m)   Body mass index is 35.25 kg/m.  Generalized: Well developed, female in no acute distress  Head: normocephalic and atraumatic,. Oropharynx benign  Neck: Supple, no carotid bruits  Cardiac: Regular rate rhythm, no murmur  Musculoskeletal: No deformity   Neurological examination   Mentation: Alert oriented to time, place, history taking. Attention span and concentration appropriate. Recent and remote memory intact.  Follows all commands speech and language fluent.   Cranial nerve II-XII: Pupils were equal round reactive to light extraocular movements were full, visual field were full on confrontational test. Facial sensation and strength were normal. hearing was intact to finger rubbing bilaterally. Uvula tongue midline. head turning and shoulder shrug were normal and symmetric.Tongue protrusion into cheek strength was normal. Motor: She has mild spasticity of bilateral lower extremities, mild bilateral hip flexion weakness Sensory: normal and symmetric to light touch, pinprick, and  Vibration, proprioception in upper and lower extremities Coordination: finger-nose-finger, heel-to-shin bilaterally, no dysmetria Reflexes: Brachioradialis 2/2, biceps 2/2, triceps 2/2, patellar 2/2, Achilles 2/2, plantar responses were flexor bilaterally. Gait and Station: Getting up from seated position pushed on chair arm, cautious, mildly unsteady, dragging left leg more than right,   DIAGNOSTIC DATA (LABS, IMAGING, TESTING) - I reviewed patient records, labs, notes, testing and imaging myself where available.  Lab Results  Component Value Date   WBC 9.4 01/15/2019   HGB 13.7 01/15/2019   HCT 39.9 01/15/2019   MCV 85 01/15/2019   PLT 259 01/15/2019      Component Value Date/Time   NA 139 01/15/2019 1658  K 4.7  01/15/2019 1658   CL 102 01/15/2019 1658   CO2 23 01/15/2019 1658   GLUCOSE 278 (H) 01/15/2019 1658   GLUCOSE 145 (H) 04/11/2015 0537   BUN 10 01/15/2019 1658   CREATININE 0.68 01/15/2019 1658   CREATININE 0.65 06/02/2013 1640   CALCIUM 9.0 01/15/2019 1658   PROT 6.8 01/15/2019 1658   ALBUMIN 4.3 01/15/2019 1658   AST 30 01/15/2019 1658   ALT 59 (H) 01/15/2019 1658   ALKPHOS 73 01/15/2019 1658   BILITOT 0.4 01/15/2019 1658   GFRNONAA 101 01/15/2019 1658   GFRAA 116 01/15/2019 1658   Tysarbri infusion since Feb 2013 JC-virus antibody negative (July 2014), Negative 0.17 in 11/11/2013, 07/07/2014 JCV 0.19 negative. -0.13 in September 2018, Negative 0.08 Jan 2019.  ASSESSMENT AND PLAN  53 y.o. year old female   Relapsing remitting multiple sclerosis  Tysarbri infusion since February 2013  MRI brain, cervical, thoracic w/wo showed stable lesion load, there is no contrast-enhancement there is no contrast-enhancement  After discussed with patient, we will proceed with ocrelizumab every 6 months  Laboratory evaluations  Restless leg syndrome  She has the urge to move her legs at evening and nighttime,  Continue gabapentin 300 mg 2 tablets 3 times a day  Bilateral lower extremity spasticity  Baclofen 10 mg 2 tablets 3 times a day, tizanidine 2 mg 3 times a day, gabapentin 300 mg up to 2 tablets 3 times a day  Depression:  Continue Effexor ER 75 mg every day    Marcial Pacas, M.D. Ph.D.  [facility] [place], [place] [address] Phone: [phone] Fax:      [phone]

## 2019-02-12 ENCOUNTER — Other Ambulatory Visit (INDEPENDENT_AMBULATORY_CARE_PROVIDER_SITE_OTHER): Payer: Self-pay

## 2019-02-12 ENCOUNTER — Other Ambulatory Visit: Payer: Self-pay

## 2019-02-12 DIAGNOSIS — M545 Low back pain: Secondary | ICD-10-CM | POA: Diagnosis not present

## 2019-02-12 DIAGNOSIS — G35 Multiple sclerosis: Secondary | ICD-10-CM | POA: Diagnosis not present

## 2019-02-12 DIAGNOSIS — Z0289 Encounter for other administrative examinations: Secondary | ICD-10-CM

## 2019-02-12 NOTE — Addendum Note (Signed)
Addended by: Noberto Retort C on: 02/12/2019 03:00 PM   Modules accepted: Orders

## 2019-02-15 LAB — QUANTIFERON-TB GOLD PLUS
QuantiFERON Mitogen Value: >10 IU/mL
QuantiFERON Nil Value: 0.03 IU/mL
QuantiFERON TB1 Ag Value: 0.05 IU/mL
QuantiFERON TB2 Ag Value: 0.04 IU/mL
QuantiFERON-TB Gold Plus: NEGATIVE

## 2019-02-15 LAB — TSH: TSH: 1.44 u[IU]/mL (ref 0.450–4.500)

## 2019-02-15 LAB — HEPATITIS B SURFACE ANTIGEN: Hepatitis B Surface Ag: NEGATIVE

## 2019-02-15 LAB — HEPATITIS B SURFACE ANTIBODY,QUALITATIVE: Hep B Surface Ab, Qual: NONREACTIVE

## 2019-02-15 LAB — HEPATITIS B CORE ANTIBODY, TOTAL: Hep B Core Total Ab: NEGATIVE

## 2019-02-15 LAB — VARICELLA ZOSTER ANTIBODY, IGG: Varicella zoster IgG: <135 index — ABNORMAL LOW (ref >165)

## 2019-02-15 LAB — VITAMIN D 25 HYDROXY (VIT D DEFICIENCY, FRACTURES): Vit D, 25-Hydroxy: 32.4 ng/mL (ref 30.0–100.0)

## 2019-02-16 ENCOUNTER — Ambulatory Visit: Payer: Self-pay | Admitting: Neurology

## 2019-02-16 ENCOUNTER — Telehealth: Payer: Self-pay | Admitting: Neurology

## 2019-02-16 NOTE — Telephone Encounter (Signed)
Michelle @ [employer] is asking for a call from RN to discuss if this pt is on hold,please call

## 2019-02-16 NOTE — Telephone Encounter (Signed)
Returned call to [employer] compliance.  They have been informed the patient is being discontinued off Tysabri.  She will be transitioning to [place].

## 2019-02-17 ENCOUNTER — Telehealth: Payer: Self-pay | Admitting: *Deleted

## 2019-02-17 NOTE — Telephone Encounter (Signed)
I spoke to the patient and she verbalized understanding of her lab results.  She is also agreeable to get the shingles vaccination (Shingrix) and understands she needs at least two weeks prior to her first Ocrevus infusion.

## 2019-02-17 NOTE — Telephone Encounter (Signed)
-----   Message from Penni Bombard, MD sent at 02/16/2019  6:13 PM EST ----- Unremarkable labs. Continue current plan. May consider to get the shingles vaccine (Shingrix) at least 2 weeks before starting ocrevus. Please call patient. -VRP

## 2019-02-27 ENCOUNTER — Other Ambulatory Visit: Payer: Self-pay

## 2019-02-27 ENCOUNTER — Ambulatory Visit: Payer: BC Managed Care – PPO | Attending: Neurology | Admitting: Physical Therapy

## 2019-02-27 DIAGNOSIS — R2681 Unsteadiness on feet: Secondary | ICD-10-CM | POA: Insufficient documentation

## 2019-02-27 DIAGNOSIS — M545 Low back pain: Secondary | ICD-10-CM | POA: Diagnosis not present

## 2019-02-27 DIAGNOSIS — G8929 Other chronic pain: Secondary | ICD-10-CM | POA: Diagnosis not present

## 2019-02-27 DIAGNOSIS — R2689 Other abnormalities of gait and mobility: Secondary | ICD-10-CM | POA: Insufficient documentation

## 2019-03-01 NOTE — Therapy (Addendum)
[facility] [address] [place], [place], [address] Phone: [phone]   Fax:  [phone]  Physical Therapy Evaluation  Patient Details  Name: Kaylee Bryant MRN: [id number] Date of Birth: 04/15/1965 Referring Provider (PT): Marcial Pacas, MD   Encounter Date: 02/27/2019  PT End of Session - 03/01/19 1752    Visit Number  1    Number of Visits  10    Date for PT Re-Evaluation  04/30/19    Authorization Type  [employer] [place]    PT Start Time  [phone]   pt arrived late   PT Stop Time  0800    PT Time Calculation (min)  36 min    Activity Tolerance  Patient tolerated treatment well    Behavior During Therapy  [facility] for tasks assessed/performed       Past Medical History:  Diagnosis Date  . 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 Coronary artery disease involving native coronary artery with unstable angina pectoris ([place]) 04/05/2015  . DDD (degenerative disc disease), lumbar   . DM (diabetes mellitus), gestational 2006   insulin at the time  . Fatigue   . Heart attack ([place])   . IBS (irritable bowel syndrome)    with chronic constipation  . Multiple sclerosis ([place])   . Obesity   . 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 CARDIAC CATHETERIZATION N/A 04/05/2015   Procedure: Left Heart Cath and Coronary Angiography;  Surgeon: Troy Sine, MD;  Location: [facility];  Service: Cardiovascular;  Laterality: N/A;  . CERVICAL CONE BIOPSY  2004  . [place], 2006  . CHOLECYSTECTOMY     1988  . COLPOSCOPY  2004  . CORONARY ARTERY BYPASS GRAFT N/A 04/07/2015   Procedure: CORONARY ARTERY BYPASS GRAFTING (CABG), OFF PUMP, TIMES ONE, USING LEFT INTERNAL MAMMARY ARTERY;  Surgeon: Rexene Alberts, MD;  Location: [facility];  Service: Open Heart Surgery;  Laterality: N/A;  LIMA to LAD  . TEE WITHOUT CARDIOVERSION N/A 04/07/2015   Procedure: TRANSESOPHAGEAL ECHOCARDIOGRAM (TEE);  Surgeon: Rexene Alberts, MD;  Location: [facility];  Service: Open Heart Surgery;  Laterality: N/A;    There were no vitals filed for this visit.    02/27/19 0723  Symptoms/Limitations  Subjective Has lower back pain - they haven't been able to figure out what it is. Has spasms in her low back and that go down her R leg. Also has balance issues due to MS. Main priority is her back pain. Has had back pain for about 2 years but it has been getting worse. Walking long distances makes the pain worse. 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 Has had to be out of work for 2 months  Pertinent History PMH: diabetes mellitus, MS  How long can you stand comfortably? only a few minutes  How long can you walk comfortably? after grocery shopping at [facility] even when pushing a cart  Patient Stated Goals relieve the pain, dont want to live on pain medication all the time.  Pain Assessment  Currently in Pain? No/denies       02/27/19 0720  Assessment  Medical Diagnosis MS  Referring Provider (PT) Marcial Pacas, MD  Onset Date/Surgical Date 02/11/19 (worsening LBP, BLE muscle  spasm, diagnosed in 2007)  Balance Screen  Has the patient fallen in the past 6 months No  Has the patient had a decrease in activity level because of a fear of falling?  No (but is more cautious )  Is the patient reluctant to leave their home because of a fear of falling?  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 Private residence  Living Arrangements Spouse/significant other;Children  Type of [place] to enter  Entrance Stairs-Number of Steps 1  Entrance Stairs-Rails None  Home Layout One level  Home Equipment None  Additional Comments has difficult going down stairs - doesn't exit house through the back, has about 20 stairs  Prior Function  Level of Independence Independent  Vocation Full time  employment Loss adjuster, chartered)  [place] wants to be able to walk her dog more - has a Management consultant [employer]  Additional Comments mild tingling in anterior calf - has to be careful when shaving  ROM / Strength  AROM / PROM / Strength Strength;AROM  AROM  AROM Assessment Site Lumbar  Lumbar Flexion 50% limited flexion   Lumbar Extension 75% limited ext - increased pain, goes down leg, burning   Lumbar - Right Side [facility], feels pain and tension in low back, almost like a burning   Lumbar - Left Side [facility], feels pain and tension in low back, almost like a burning   Lumbar - Right Rotation 25% R rotation - feels it in low back  Lumbar - Left Rotation WFL, mild tension  Strength  Strength Assessment Site Hip;Ankle;Knee  Right/Left Hip Right;Left  Right/Left Knee Right;Left  Right/Left Ankle Right;Left  Right Hip Flexion 4/5 (pain in low back)  Right Hip ABduction 3+/5  Left Hip Flexion 5/5  Left Hip ABduction 5/5  Right Knee Flexion 4/5  Right Knee Extension 4+/5  Left Knee Flexion 5/5  Left Knee Extension 4+/5  Right Ankle Dorsiflexion 4+/5  Left Ankle Dorsiflexion 4+/5  Palpation  Spinal mobility hypomobile and painful at central L3-L5, unilateral R L3-L5. performed gentle grade II mobilizations to listed segments.    Palpation comment TTP R piriformis, R lower lumbar paraspinals, quadratus lumborum   Special Tests   Special Tests Lumbar  Lumbar Tests Slump Test;Prone Knee Bend Test;Straight Leg Raise  Slump test  Findings Negative  Side Rt (and left)  Comment R leg felt tight throughout, no N/T or radicular symptoms   Prone Knee Bend Test  Findings Positive  Side Rt (negative left)  Comment pt with increased tightness R>L  - however no numbness/tingling  Straight Leg Raise  Findings Negative  Side  Rt (neg left)  Comment only able to tolerate at approx. 40 deg  Transfers  Five time sit to stand  comments  24.91 seconds no UE support from chair, tension in low back  Ambulation/Gait  Ambulation/Gait Yes  Ambulation/Gait Assistance 6: Modified independent (Device/Increase time)  Ambulation Distance (Feet) 100 Feet (approx throughout clinic)  Assistive device None  Gait Pattern Step-through pattern;Decreased stance time - right;Decreased weight shift to right;Antalgic  Ambulation Surface Level;Indoor  Gait Comments notices she has been dragging both feet more in the past 2 months. The longer she walks her back gets tighter and tighter. Will relax after she sits down.      Initial HEP (forgot to save code):  2 x  10 reps prone press ups on elbows (no incr  in pain discomfort).          Objective measurements completed on examination: See above findings.              PT Education - 03/01/19 1750    Education Details  clinical findings, POC, initial HEP (prone press up)    Person(s) Educated  Patient    Methods  Explanation;Handout    Comprehension  Verbalized understanding;Returned demonstration         PT Short Term Goals - 03/01/19 1808      PT SHORT TERM GOAL #1   Title  Patient will be independent in order to build upon functional gains made in threapy. ALL STGS DUE 03/29/19    Time  4   4 weeks due to delay in scheduling   Period  Weeks    Status  New    Target Date  03/29/19      PT SHORT TERM GOAL #2   Title  FGA goal to be written as appropriate.    Baseline  Not yet assessed due to time constraints at eval.    Time  4    Period  Weeks    Status  New      PT SHORT TERM GOAL #3   Title  Patient will perform 5x sit <> stand in 20 seconds or less with no UE support with minimal to no tension in low back to demo improved LE strength.    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    Baseline  able to only stand for a couple of minutes.    Time  4    Period  Weeks    Status  New      PT Long Term Goals - 03/01/19 1813      PT LONG TERM GOAL #1   Title  Patient will be independent in order to build upon functional gains made in therapy. ALL LTGS DUE 04/26/19    Time  8   8 weeks due to delay in scheduling   Period  Weeks    Status  New    Target Date  04/26/19      PT LONG TERM GOAL #2   Title  Patient will report worst LBP to a 3/10 or less in order to be able to return to work.    Time  8    Period  Weeks    Status  New      PT LONG TERM GOAL #3   Title  FGA goal to be written as appropriate.    Baseline  not yet assessed.    Time  8    Period  Weeks    Status  New      PT LONG TERM GOAL #4   Title  Patient will increase R hip ABD strength to at least a 4/5 in order to demo improved strength.    Baseline  3+/5 ON 02/27/19    Time  8    Period  Weeks    Status  New      PT LONG TERM GOAL #5   Title  Patient will be  able to perform all lumbar AROM without increased pain in order to demo improved tolerance for activity.    Time  8    Period  Weeks    Status  New                03/01/19 1753  Plan  Clinical Impression Statement Patient is a 53 year old female referred to Neuro OPPT for evaluation with primary concern of R>L chronic LBP with radicular symptoms and gait/balance impairments due to [place] (diagnosed in 2007). PMH is significant for:  diabetes, MS. The following deficits were present during the exam: hypomobility of lumbar spine, tender to palpation of R paraspinals, quadratus lumborum, and lumbar paraspinals, decreased lumbar and hip ROM, gait abnormalities, balance impairments, decreased functional LE strength. Unable to perform a balance today due to time constraints - will perform at next visit. 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)  Pt will benefit from skilled therapeutic intervention in order to improve on the following deficits Abnormal gait;Decreased activity tolerance;Decreased balance;Decreased mobility;Decreased strength;Difficulty walking;Decreased range of motion;Pain;Increased muscle spasms;Hypomobility  Stability/Clinical Decision Making Stable/Uncomplicated  Clinical Decision Making Low  Rehab Potential Good  PT Frequency 2x / week (followed by 1x week for 3 weeks)  PT Duration 3 weeks  PT Treatment/Interventions Aquatic Therapy;ADLs/Self Care Home Management;Balance training;Gait training;Stair training;Functional mobility training;Neuromuscular re-education;Therapeutic exercise;Therapeutic activities;Patient/family education;Passive range of motion;Spinal Manipulations;Joint Manipulations;Manual techniques;Dry needling  PT Next Visit Plan Will need more visits scheduled. assess balance - perform FGA. add to initial HEP for R>L LBP and radicular symptoms, and balance. soft tissue mobilization to R piriformis/lumbar paraspinals, mobilizations to lumbar spine for pain relief. Progress prone press ups - to hands if tolerable. Aquatic therapy?  PT Home Exercise Plan prone press up on elbows (forgot to save access code)  Consulted and Agree with Plan of Care Patient        Patient will benefit from skilled therapeutic intervention in order to improve the following deficits and impairments:     Visit Diagnosis: Chronic right-sided low back pain, unspecified whether sciatica present  Unsteadiness on feet  Other abnormalities of gait and mobility     Problem  List Patient Active Problem List   Diagnosis Date Noted  . Low back pain 02/11/2019  . 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 Gait abnormality 07/10/2017  . Other dystonia 07/10/2017  . Anemia 07/27/2015  . Restless leg syndrome 07/27/2015  . Dyslipidemia 07/26/2015  . 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 Obesity (BMI 30-39.9)   . Pain in the chest 04/05/2015  . Chest pain 04/05/2015  . NSTEMI (non-ST elevated myocardial infarction) ([place])   . Irregular menses 01/25/2014  . S/P cholecystectomy 06/02/2013  . IBS (irritable bowel syndrome) 06/02/2013  . Oligomenorrhea 01/25/2013  . Atypical nevi 01/25/2013  . Abnormal Pap smear 01/25/2013  . Multiple sclerosis ([place]) 09/02/2012  . Left knee pain 09/02/2012  . Abnormality of gait 09/02/2012    Arliss Journey, PT, DPT 03/01/2019, 5:53 PM  [facility] [address] [place], [place], [address] Phone: [phone]   Fax:  [phone]  Name: Kaylee Bryant MRN: [id number] Date of Birth: 05-04-1965

## 2019-03-04 ENCOUNTER — Other Ambulatory Visit: Payer: Self-pay

## 2019-03-04 ENCOUNTER — Ambulatory Visit: Payer: BC Managed Care – PPO

## 2019-03-04 DIAGNOSIS — R2689 Other abnormalities of gait and mobility: Secondary | ICD-10-CM | POA: Diagnosis not present

## 2019-03-04 DIAGNOSIS — M545 Low back pain: Secondary | ICD-10-CM | POA: Diagnosis not present

## 2019-03-04 DIAGNOSIS — G8929 Other chronic pain: Secondary | ICD-10-CM

## 2019-03-04 DIAGNOSIS — R2681 Unsteadiness on feet: Secondary | ICD-10-CM

## 2019-03-04 NOTE — Therapy (Signed)
[facility] [address] [place], [place], [address] Phone: [phone]   Fax:  [phone]  Physical Therapy Treatment  Patient Details  Name: Kaylee Bryant MRN: [id number] Date of Birth: 02-18-1966 Referring Provider (PT): Marcial Pacas, MD   Encounter Date: 03/04/2019  PT End of Session - 03/04/19 1235    Visit Number  2    Number of Visits  10    Date for PT Re-Evaluation  04/30/19    Authorization Type  [employer]    PT Start Time  [id number]    PT Stop Time  1316    PT Time Calculation (min)  41 min    Activity Tolerance  Patient tolerated treatment well    Behavior During Therapy  [facility] for tasks assessed/performed       Past Medical History:  Diagnosis Date  . 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 Coronary artery disease involving native coronary artery with unstable angina pectoris ([place]) 04/05/2015  . DDD (degenerative disc disease), lumbar   . DM (diabetes mellitus), gestational 2006   insulin at the time  . Fatigue   . Heart attack ([place])   . IBS (irritable bowel syndrome)    with chronic constipation  . Multiple sclerosis ([place])   . Obesity   . 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 CARDIAC CATHETERIZATION N/A 04/05/2015   Procedure: Left Heart Cath and Coronary Angiography;  Surgeon: Troy Sine, MD;  Location: [facility];  Service: Cardiovascular;  Laterality: N/A;  . CERVICAL CONE BIOPSY  2004  . [place], 2006  . CHOLECYSTECTOMY     1988  . COLPOSCOPY  2004  . CORONARY ARTERY BYPASS GRAFT N/A 04/07/2015   Procedure: CORONARY ARTERY BYPASS GRAFTING (CABG), OFF PUMP, TIMES ONE, USING LEFT INTERNAL MAMMARY ARTERY;  Surgeon: Rexene Alberts, MD;  Location: [facility];  Service: Open Heart Surgery;  Laterality: N/A;  LIMA to LAD  . TEE WITHOUT CARDIOVERSION N/A 04/07/2015   Procedure: TRANSESOPHAGEAL  ECHOCARDIOGRAM (TEE);  Surgeon: Rexene Alberts, MD;  Location: [facility];  Service: Open Heart Surgery;  Laterality: N/A;    There were no vitals filed for this visit.  Subjective Assessment - 03/04/19 1236    Subjective  Pt reports that she went back to work this week. Reports that she is on feet the whole time. First day was rough as she wore the wrong shoes. The second day she did better. Has also been taking her meds more scheduled.    Pertinent History  PMH: diabetes mellitus, MS    How long can you stand comfortably?  only a few minutes    How long can you walk comfortably?  after grocery shopping at [facility] even when pushing a cart    Patient Stated Goals  relieve the pain, dont want to live on pain medication all the time.    Currently in Pain?  No/denies   reports pain in back and right leg is worse when walking a lot and better when sitting. Calms pretty quickly when sits back down.        [facility] PT Assessment - 03/04/19 1240      Functional Gait  Assessment   Gait assessed   Yes    Gait Level Surface  Walks 20 ft, slow speed,  abnormal gait pattern, evidence for imbalance or deviates 10-15 in outside of the 12 in walkway width. Requires more than 7 sec to ambulate 20 ft.    Change in Gait Speed  Able to change speed, demonstrates mild gait deviations, deviates 6-10 in outside of the 12 in walkway width, or no gait deviations, unable to achieve a major change in velocity, or uses a change in velocity, or uses an assistive device.    Gait with Horizontal Head Turns  Performs head turns with moderate changes in gait velocity, slows down, deviates 10-15 in outside 12 in walkway width but recovers, can continue to walk.    Gait with Vertical Head Turns  Performs task with slight change in gait velocity (eg, minor disruption to smooth gait path), deviates 6 - 10 in outside 12 in walkway width or uses assistive device    Gait and Pivot Turn  Turns slowly, requires verbal cueing, or requires  several small steps to catch balance following turn and stop    Step Over Obstacle  Is able to step over one shoe box (4.5 in total height) but must slow down and adjust steps to clear box safely. May require verbal cueing.    Gait with Narrow Base of Support  Ambulates 7-9 steps.    Gait with Eyes Closed  Walks 20 ft, uses assistive device, slower speed, mild gait deviations, deviates 6-10 in outside 12 in walkway width. Ambulates 20 ft in less than 9 sec but greater than 7 sec.    Ambulating Backwards  Walks 20 ft, uses assistive device, slower speed, mild gait deviations, deviates 6-10 in outside 12 in walkway width.    Steps  Two feet to a stair, must use rail.    Total Score  15                   [facility] Adult PT Treatment/Exercise - 03/04/19 1247      Neuro Re-ed    Neuro Re-ed Details   Standing in corner: feet together eyes open and eyes closed x 30 sec, head turns left/right x 10, tandem stance 30 sec x 2 each position. Increased sway with eyes closed and head turns.      Exercises   Exercises  Other Exercises    Other Exercises   Prone press ups x 10. Supine posterior pelvic tilts x 10, pelvic tilt with bilateral ER, trunk rotation x 5 each side with 10 sec holds, right piriformis stretch 30 sec x 2      Manual Therapy   Manual Therapy  Joint mobilization;Soft tissue mobilization    Joint Mobilization  Grade 2/3 PA central and unilateral mobs L4-S1 3 bouts of 20 sec. Pt reported some pain in central and right unilateral mobs.    Soft tissue mobilization  STM to right lumbar paraspinals and right piriformis x 2 min.             PT Education - 03/04/19 2049    Education Details  Pt instructed in HEP    Person(s) Educated  Patient    Methods  Explanation;Demonstration;Handout    Comprehension  Verbalized understanding;Returned demonstration       PT Short Term Goals - 03/04/19 2050      PT SHORT TERM GOAL #1   Title  Patient will be independent in order to  build upon functional gains made in threapy. ALL STGS DUE 03/29/19    Time  4   4 weeks due to  delay in scheduling   Period  Weeks    Status  New    Target Date  03/29/19      PT SHORT TERM GOAL #2   Title  Pt will improve FGA from 15/30 to >18/30 for improved balance and decreased fall risk.    Baseline  15/30 on 03/04/2019    Time  4    Period  Weeks    Status  New      PT SHORT TERM GOAL #3   Title  Patient will perform 5x sit <> stand in 20 seconds or less with no UE support with minimal to no tension in low back to demo improved LE strength.    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    Baseline  able to only stand for a couple of minutes.    Time  4    Period  Weeks    Status  New        PT Long Term Goals - 03/04/19 2052      PT LONG TERM GOAL #1   Title  Patient will be independent in order to build upon functional gains made in therapy. ALL LTGS DUE 04/26/19    Time  8   8 weeks due to delay in scheduling   Period  Weeks    Status  New      PT LONG TERM GOAL #2   Title  Patient will report worst LBP to a 3/10 or less in order to be able to return to work.    Time  8    Period  Weeks    Status  New      PT LONG TERM GOAL #3   Title  Pt will increase FGA to 20 or higher for decreased fall risk and improved balance.    Baseline  15/30 on 03/04/2019    Time  8    Period  Weeks    Status  Revised      PT LONG TERM GOAL #4   Title  Patient will increase R hip ABD strength to at least a 4/5 in order to demo improved strength.    Baseline  3+/5 ON 02/27/19    Time  8    Period  Weeks    Status  New      PT LONG TERM GOAL #5   Title  Patient will be able to perform all lumbar AROM without increased pain in order to demo improved tolerance for  activity.    Time  8    Period  Weeks    Status  New            Plan - 03/04/19 2053    Clinical Impression Statement  Pt responded well to prone press-ups and stretching with decreased back pain. Pt is high fall risk based on FGA score. Pt will benefit from skilled PT to continue to address pain, strength and balance deficits.    Personal Factors and Comorbidities  Comorbidity 2;Time since onset of injury/illness/exacerbation;Past/Current Experience    Comorbidities  diabetes mellitus, MS    Examination-Activity Limitations  Stand;Locomotion Level  Examination-Participation Restrictions  Community Activity   work - as a Sales executive  Stable/Uncomplicated    Rehab Potential  Good    PT Frequency  2x / week   followed by 1x week for 3 weeks   PT Duration  3 weeks    PT Treatment/Interventions  Aquatic Therapy;ADLs/Self Care Home Management;Balance training;Gait training;Stair training;Functional mobility training;Neuromuscular re-education;Therapeutic exercise;Therapeutic activities;Patient/family education;Passive range of motion;Spinal Manipulations;Joint Manipulations;Manual techniques;Dry needling    PT Next Visit Plan  Manual techniques as needed for back. Core stabilization and prone press-ups. Progress balance and gait.    PT Home Exercise Plan  prone press up on elbows (forgot to save access code)    Consulted and Agree with Plan of Care  Patient       Patient will benefit from skilled therapeutic intervention in order to improve the following deficits and impairments:  Abnormal gait, Decreased activity tolerance, Decreased balance, Decreased mobility, Decreased strength, Difficulty walking, Decreased range of motion, Pain, Increased muscle spasms, Hypomobility  Visit Diagnosis: Chronic right-sided low back pain, unspecified whether sciatica present  Unsteadiness on feet  Other abnormalities of gait and mobility     Problem  List Patient Active Problem List   Diagnosis Date Noted  . Low back pain 02/11/2019  . 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 Gait abnormality 07/10/2017  . Other dystonia 07/10/2017  . Anemia 07/27/2015  . Restless leg syndrome 07/27/2015  . Dyslipidemia 07/26/2015  . 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 Obesity (BMI 30-39.9)   . Pain in the chest 04/05/2015  . Chest pain 04/05/2015  . NSTEMI (non-ST elevated myocardial infarction) ([place])   . Irregular menses 01/25/2014  . S/P cholecystectomy 06/02/2013  . IBS (irritable bowel syndrome) 06/02/2013  . Oligomenorrhea 01/25/2013  . Atypical nevi 01/25/2013  . Abnormal Pap smear 01/25/2013  . Multiple sclerosis ([place]) 09/02/2012  . Left knee pain 09/02/2012  . Abnormality of gait 09/02/2012    Electa Sniff, PT, DPT, NCS 03/04/2019, 8:57 PM  [facility] [address] [place], [place], [address] Phone: [phone]   Fax:  [phone]  Name: Kaylee Bryant MRN: [id number] Date of Birth: 1965-06-02

## 2019-03-04 NOTE — Patient Instructions (Signed)
Access Code: [email or web address]  URL: [email or web address]  Date: 03/04/2019  Prepared by: Cherly Anderson   Exercises Prone Press Up on Elbows - 10 reps - 2 sets - 1x daily - 7x weekly Supine Posterior Pelvic Tilt - 10 reps - 2 sets - 1x daily - 7x weekly                  Bent Knee Fallouts - 10 reps - 2 sets - 1x daily - 7x weekly Supine Lower Trunk Rotation - 4 reps - 1 sets - 30 sec hold - 1x daily - 7x weekly Supine Piriformis Stretch with Foot on Ground - 4 reps - 1 sets - 30 hold - 1x daily - 7x weekly Tandem Stance - 2 reps - 1 sets - 30 sec hold - 2x daily - 7x weekly Romberg Stance with Eyes Closed - 2 reps - 1 sets - 30 sec hold - 2x daily - 7x weekly Standing with Head Rotation - 10 reps - 1 sets - 2x daily - 7x weekly

## 2019-03-06 ENCOUNTER — Ambulatory Visit: Payer: BC Managed Care – PPO

## 2019-03-06 ENCOUNTER — Other Ambulatory Visit: Payer: Self-pay

## 2019-03-06 DIAGNOSIS — R2681 Unsteadiness on feet: Secondary | ICD-10-CM | POA: Diagnosis not present

## 2019-03-06 DIAGNOSIS — G8929 Other chronic pain: Secondary | ICD-10-CM | POA: Diagnosis not present

## 2019-03-06 DIAGNOSIS — M545 Low back pain: Secondary | ICD-10-CM | POA: Diagnosis not present

## 2019-03-06 DIAGNOSIS — R2689 Other abnormalities of gait and mobility: Secondary | ICD-10-CM | POA: Diagnosis not present

## 2019-03-06 NOTE — Patient Instructions (Addendum)
Access Code: [email or web address]  URL: [email or web address]  Date: 03/06/2019  Prepared by: Cherly Anderson   Exercises Prone Press Up on Elbows - 10 reps - 2 sets - 1x daily - 7x weekly Supine Posterior Pelvic Tilt - 10 reps - 2 sets - 1x daily - 7x weekly                  Bent Knee Fallouts - 10 reps - 2 sets - 1x daily - 7x weekly Supine Lower Trunk Rotation - 4 reps - 1 sets - 30 sec hold - 1x daily - 7x weekly Supine Piriformis Stretch with Foot on Ground - 4 reps - 1 sets - 30 hold - 1x daily - 7x weekly Tandem Stance - 2 reps - 1 sets - 30 sec hold - 2x daily - 7x weekly Romberg Stance with Eyes Closed - 2 reps - 1 sets - 30 sec hold - 2x daily - 7x weekly Standing with Head Rotation - 10 reps - 1 sets - 2x daily - 7x weekly Walking March - 10 reps - 3 sets - 1x daily - 7x weekly Standing Toe Taps - 10 reps - 3 sets - 1x daily - 7x weekly Clamshell - 10 reps - 2 sets - 1x daily - 5x weekly Supine Bridge - 10 reps - 2 sets - 1x daily - 5x weekly

## 2019-03-06 NOTE — Therapy (Signed)
[facility] [address] [place], [place], [address] Phone: [phone]   Fax:  [phone]  Physical Therapy Treatment  Patient Details  Name: Kaylee Bryant MRN: [id number] Date of Birth: 1965-10-22 Referring Provider (PT): Marcial Pacas, MD   Encounter Date: 03/06/2019  PT End of Session - 03/06/19 0726    Visit Number  3    Number of Visits  10    Date for PT Re-Evaluation  04/30/19    Authorization Type  [employer] [place]    PT Start Time  [phone]   pt running behind   PT Stop Time  0805    PT Time Calculation (min)  41 min    Activity Tolerance  Patient tolerated treatment well    Behavior During Therapy  [facility] for tasks assessed/performed       Past Medical History:  Diagnosis Date  . 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 Coronary artery disease involving native coronary artery with unstable angina pectoris ([place]) 04/05/2015  . DDD (degenerative disc disease), lumbar   . DM (diabetes mellitus), gestational 2006   insulin at the time  . Fatigue   . Heart attack ([place])   . IBS (irritable bowel syndrome)    with chronic constipation  . Multiple sclerosis ([place])   . Obesity   . 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 CARDIAC CATHETERIZATION N/A 04/05/2015   Procedure: Left Heart Cath and Coronary Angiography;  Surgeon: Troy Sine, MD;  Location: [place] [facility] [facility];  Service: Cardiovascular;  Laterality: N/A;  . CERVICAL CONE BIOPSY  2004  . [place], 2006  . CHOLECYSTECTOMY     1988  . COLPOSCOPY  2004  . CORONARY ARTERY BYPASS GRAFT N/A 04/07/2015   Procedure: CORONARY ARTERY BYPASS GRAFTING (CABG), OFF PUMP, TIMES ONE, USING LEFT INTERNAL MAMMARY ARTERY;  Surgeon: Rexene Alberts, MD;  Location: [facility];  Service: Open Heart Surgery;  Laterality: N/A;  LIMA to LAD  . TEE WITHOUT CARDIOVERSION N/A 04/07/2015   Procedure: TRANSESOPHAGEAL ECHOCARDIOGRAM (TEE);  Surgeon: Rexene Alberts, MD;  Location: [facility];  Service: Open Heart Surgery;  Laterality: N/A;    There were no vitals filed for this visit.  Subjective Assessment - 03/06/19 0727    Subjective  Pt reports that she is doing well. Was tired after working yesterday but no significant pain.    Pertinent History  PMH: diabetes mellitus, MS    How long can you stand comfortably?  only a few minutes    How long can you walk comfortably?  after grocery shopping at [facility] even when pushing a cart    Patient Stated Goals  relieve the pain, dont want to live on pain medication all the time.    Currently in Pain?  No/denies                       [facility] Adult PT Treatment/Exercise - 03/06/19 0748      Ambulation/Gait   Ambulation/Gait  Yes    Ambulation/Gait Assistance  7: Independent    Ambulation Distance (Feet)  345 Feet    Assistive device  None    Gait Pattern  Step-through pattern;Decreased hip/knee flexion - right;Right foot flat;Left foot flat;Decreased trunk rotation;Decreased arm swing - right;Decreased arm swing - left  Ambulation Surface  Level;Indoor    Stairs  Yes    Stairs Assistance  6: Modified independent (Device/Increase time)    Stair Management Technique  Two rails;Alternating pattern;Step to pattern    Number of Stairs  16    Gait Comments  Pt was instructed during gait to relax arms as tends to hold in high guard position. PT encouraged to relax and try to get more arm swing. Also instructed to increase right hip flexion. Had patient walk with mirror in front for more visual cues. On steps patient initially using step-to pattern with descent leading with right LE. Had patient try reciprocal pattern and was able to perform moving slower but did not feel as confident with less eccentric control.      Neuro Re-ed    Neuro Re-ed Details   At counter: marching gait 6' x 4, side stepping 6' x 4, reciprocal steps  over 4 cones with cues to control movement for increased SLS time with occasional UE support 6' x 4. Alternating toe taps on cone without UE support 10 x 2 close SBA. Standing on foam with feet together without UE support x 30 sec eyes open, attempted eyes closed but LOB anterior quickly. Standing on foam with head turns left/right x 10 close SBA. Standing on floor feet together eyes closed x 30 sec with CGA and increased sway.      Exercises   Exercises  Other Exercises    Other Exercises   Step-ups on 4" step with right LE in front of mirror for visual cues and verbal cues to keep pelvis level and tighten tummy x 10 right with occasional light UE suport then repeated with lateral step-ups on right x 10. Sidelying hip ER/clamshell  10 x 2 with verbal cues for form, Supine bridging x 10 with TA contraction. Pt reported back a little sore/tight with step-ups but resided quickly when stopped.             PT Education - 03/06/19 0821    Education Details  Added walking march, sidestepping, sidelying clamshell and bridges to HEP.    Person(s) Educated  Patient    Methods  Explanation;Demonstration;Handout    Comprehension  Verbalized understanding;Returned demonstration       PT Short Term Goals - 03/04/19 2050      PT SHORT TERM GOAL #1   Title  Patient will be independent in order to build upon functional gains made in threapy. ALL STGS DUE 03/29/19    Time  4   4 weeks due to delay in scheduling   Period  Weeks    Status  New    Target Date  03/29/19      PT SHORT TERM GOAL #2   Title  Pt will improve FGA from 15/30 to >18/30 for improved balance and decreased fall risk.    Baseline  15/30 on 03/04/2019    Time  4    Period  Weeks    Status  New      PT SHORT TERM GOAL #3   Title  Patient will perform 5x sit <> stand in 20 seconds or less with no UE support with minimal to no tension in low back to demo improved LE strength.    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    Baseline  able to only stand for a couple of minutes.    Time  4    Period  Weeks    Status  New        PT Long Term Goals - 03/04/19 2052      PT LONG TERM GOAL #1   Title  Patient will be independent in order to build upon functional gains made in therapy. ALL LTGS DUE 04/26/19    Time  8   8 weeks due to delay in scheduling   Period  Weeks    Status  New      PT LONG TERM GOAL #2   Title  Patient will report worst LBP to a 3/10 or less in order to be able to return to work.    Time  8    Period  Weeks    Status  New      PT LONG TERM GOAL #3   Title  Pt will increase FGA to 20 or higher for decreased fall risk and improved balance.    Baseline  15/30 on 03/04/2019    Time  8    Period  Weeks    Status  Revised      PT LONG TERM GOAL #4   Title  Patient will increase R hip ABD strength to at least a 4/5 in order to demo improved strength.    Baseline  3+/5 ON 02/27/19    Time  8    Period  Weeks    Status  New      PT LONG TERM GOAL #5   Title  Patient will be able to perform all lumbar AROM without increased pain in order to demo improved tolerance for activity.    Time  8    Period  Weeks    Status  New            Plan - 03/06/19 [id number]    Clinical Impression Statement  Pt reporting decreased back pain with work more with back tightening some towards end of shift. PT focused more on control with SLS activities today as well as right hip strengthening. Pt with less control on right LE especially eccentrically.    Personal Factors and Comorbidities  Comorbidity 2;Time since onset of injury/illness/exacerbation;Past/Current Experience    Comorbidities  diabetes mellitus, MS    Examination-Activity Limitations  Stand;Locomotion Level     Examination-Participation Restrictions  Community Activity   work - as a Sales executive  Stable/Uncomplicated    Rehab Potential  Good    PT Frequency  2x / week   followed by 1x week for 3 weeks   PT Duration  3 weeks    PT Treatment/Interventions  Aquatic Therapy;ADLs/Self Care Home Management;Balance training;Gait training;Stair training;Functional mobility training;Neuromuscular re-education;Therapeutic exercise;Therapeutic activities;Patient/family education;Passive range of motion;Spinal Manipulations;Joint Manipulations;Manual techniques;Dry needling    PT Next Visit Plan  How has back been doing? Work more on right hip strengthening including eccentric control on step. Dynamic balance activities. Try to get in pool a couple sessions in [place]? STG check may be due with delay in scheduling.    PT Home Exercise Plan  prone press up on elbows (forgot to save access code)    Consulted and Agree with Plan of Care  Patient  Patient will benefit from skilled therapeutic intervention in order to improve the following deficits and impairments:  Abnormal gait, Decreased activity tolerance, Decreased balance, Decreased mobility, Decreased strength, Difficulty walking, Decreased range of motion, Pain, Increased muscle spasms, Hypomobility  Visit Diagnosis: Chronic right-sided low back pain, unspecified whether sciatica present  Unsteadiness on feet  Other abnormalities of gait and mobility     Problem List Patient Active Problem List   Diagnosis Date Noted  . Low back pain 02/11/2019  . 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 Gait abnormality 07/10/2017  . Other dystonia 07/10/2017  . Anemia 07/27/2015  . Restless leg syndrome 07/27/2015  . Dyslipidemia 07/26/2015  . 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 Obesity (BMI 30-39.9)   . Pain in the chest 04/05/2015  . Chest pain 04/05/2015  . NSTEMI (non-ST elevated myocardial infarction) ([place])   .  Irregular menses 01/25/2014  . S/P cholecystectomy 06/02/2013  . IBS (irritable bowel syndrome) 06/02/2013  . Oligomenorrhea 01/25/2013  . Atypical nevi 01/25/2013  . Abnormal Pap smear 01/25/2013  . Multiple sclerosis ([place]) 09/02/2012  . Left knee pain 09/02/2012  . Abnormality of gait 09/02/2012    Electa Sniff, PT, DPT, NCS 03/06/2019, 8:27 AM  [facility] [address] [place], [place], [address] Phone: [phone]   Fax:  [phone]  Name: AIDYNN POPOLIZIO MRN: [id number] Date of Birth: 17-Oct-1965

## 2019-03-30 ENCOUNTER — Other Ambulatory Visit: Payer: Self-pay

## 2019-03-30 ENCOUNTER — Ambulatory Visit: Payer: BC Managed Care – PPO | Attending: Neurology

## 2019-03-30 DIAGNOSIS — G8929 Other chronic pain: Secondary | ICD-10-CM

## 2019-03-30 DIAGNOSIS — R2689 Other abnormalities of gait and mobility: Secondary | ICD-10-CM | POA: Diagnosis not present

## 2019-03-30 DIAGNOSIS — M545 Low back pain, unspecified: Secondary | ICD-10-CM

## 2019-03-30 DIAGNOSIS — R2681 Unsteadiness on feet: Secondary | ICD-10-CM

## 2019-03-30 NOTE — Therapy (Signed)
[facility] [address] [place], [place], [address] Phone: [phone]   Fax:  [phone]  Physical Therapy Treatment  Patient Details  Name: Kaylee Bryant MRN: [id number] Date of Birth: [birth year] Referring Provider (PT): Marcial Pacas, MD   Encounter Date: 03/30/2019  PT End of Session - 03/30/19 0842    Visit Number  4    Number of Visits  10    Date for PT Re-Evaluation  04/30/19    Authorization Type  [employer] [place]    PT Start Time  [phone]    PT Stop Time  [phone]    PT Time Calculation (min)  41 min    Activity Tolerance  Patient tolerated treatment well    Behavior During Therapy  [facility] for tasks assessed/performed       Past Medical History:  Diagnosis Date  . 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 Coronary artery disease involving native coronary artery with unstable angina pectoris ([place]) 04/05/2015  . DDD (degenerative disc disease), lumbar   . DM (diabetes mellitus), gestational 2006   insulin at the time  . Fatigue   . Heart attack ([place])   . IBS (irritable bowel syndrome)    with chronic constipation  . Multiple sclerosis ([place])   . Obesity   . 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 CARDIAC CATHETERIZATION N/A 04/05/2015   Procedure: Left Heart Cath and Coronary Angiography;  Surgeon: Troy Sine, MD;  Location: [facility];  Service: Cardiovascular;  Laterality: N/A;  . CERVICAL CONE BIOPSY  2004  . [place], 2006  . CHOLECYSTECTOMY     1988  . COLPOSCOPY  2004  . CORONARY ARTERY BYPASS GRAFT N/A 04/07/2015   Procedure: CORONARY ARTERY BYPASS GRAFTING (CABG), OFF PUMP, TIMES ONE, USING LEFT INTERNAL MAMMARY ARTERY;  Surgeon: Rexene Alberts, MD;  Location: [facility];  Service: Open Heart Surgery;  Laterality: N/A;  LIMA to LAD  . TEE WITHOUT CARDIOVERSION N/A 04/07/2015   Procedure: TRANSESOPHAGEAL  ECHOCARDIOGRAM (TEE);  Surgeon: Rexene Alberts, MD;  Location: [facility];  Service: Open Heart Surgery;  Laterality: N/A;    There were no vitals filed for this visit.  Subjective Assessment - 03/30/19 0846    Subjective  Pt reports that 2 weekends ago she was soaking in the bathtub and she slipped and hit her left bottom and knee which were bruised. Has not been laying on that side as much due to that. Reports one really bad evening the mucsles spasming in legs. The only thing she had done different was push the Christmas tree box to the corner. Occurred in evening and was not able to catch up on pain with meds. Pt has also had a death in family.    Pertinent History  PMH: diabetes mellitus, MS    How long can you stand comfortably?  only a few minutes    How long can you walk comfortably?  after grocery shopping at [facility] even when pushing a cart    Patient Stated Goals  relieve the pain, dont want to live on pain medication all the time.    Currently in Pain?  Yes    Pain Score  3     Pain Location  Back    Pain Orientation  Lower  Pain Descriptors / Indicators  Aching    Pain Type  Chronic pain         [facility] PT Assessment - 03/30/19 0851      Functional Gait  Assessment   Gait assessed   Yes    Gait Level Surface  Walks 20 ft in less than 7 sec but greater than 5.5 sec, uses assistive device, slower speed, mild gait deviations, or deviates 6-10 in outside of the 12 in walkway width.    Change in Gait Speed  Able to change speed, demonstrates mild gait deviations, deviates 6-10 in outside of the 12 in walkway width, or no gait deviations, unable to achieve a major change in velocity, or uses a change in velocity, or uses an assistive device.    Gait with Horizontal Head Turns  Performs head turns smoothly with slight change in gait velocity (eg, minor disruption to smooth gait path), deviates 6-10 in outside 12 in walkway width, or uses an assistive device.    Gait with Vertical Head  Turns  Performs task with slight change in gait velocity (eg, minor disruption to smooth gait path), deviates 6 - 10 in outside 12 in walkway width or uses assistive device    Gait and Pivot Turn  Pivot turns safely in greater than 3 sec and stops with no loss of balance, or pivot turns safely within 3 sec and stops with mild imbalance, requires small steps to catch balance.    Step Over Obstacle  Is able to step over one shoe box (4.5 in total height) but must slow down and adjust steps to clear box safely. May require verbal cueing.    Gait with Narrow Base of Support  Ambulates 7-9 steps.    Gait with Eyes Closed  Cannot walk 20 ft without assistance, severe gait deviations or imbalance, deviates greater than 15 in outside 12 in walkway width or will not attempt task.    Ambulating Backwards  Walks 20 ft, uses assistive device, slower speed, mild gait deviations, deviates 6-10 in outside 12 in walkway width.    Steps  Alternating feet, must use rail.    Total Score  17                   [facility] Adult PT Treatment/Exercise - 03/30/19 0851      Transfers   Five time sit to stand comments   22 sec without UE support      Ambulation/Gait   Ambulation/Gait  Yes    Ambulation/Gait Assistance  7: Independent    Ambulation Distance (Feet)  230 Feet    Assistive device  None    Gait Pattern  Step-through pattern;Decreased arm swing - left;Decreased arm swing - right;Decreased trunk rotation    Ambulation Surface  Level;Indoor    Stairs  Yes    Stairs Assistance  6: Modified independent (Device/Increase time)    Stair Management Technique  Two rails;Alternating pattern    Number of Stairs  4    Gait Comments  Pt was cued to relax arms to get more arm swing.      Therapeutic Activites    Therapeutic Activities  Lifting;Work Insurance account manager  Pt instructed that when standing at sink doing dishes to keep slight bend in knees to help support back and stay close to sink. PT  had patient demonstrate how she reaches in to med cart to get items down low. Pt only bending at back. Advised  to try to use legs some and brace with 1 hand on knee for support as needed. Pt able to demonstrate this technique but limited with low squat due to weakness in legs.       Neuro Re-ed    Neuro Re-ed Details   Reciprocal steps over 4 cones at counter with light 1 UE support x 2 laps then x 2 laps tapping cone first for increased single leg stance time. Pt cued to brace tummy during activity.      Exercises   Exercises  Other Exercises    Other Exercises   Sidelying right clamshelll x 10 with verbal cues for form then repeated with yellow theraband. Step-down on 4" step 10 x 2 lowering with right LE to work on eccentric control forward and lateral. Pt reported just a little soreness in left low back during activity, none after..             PT Education - 03/30/19 0932    Education Details  Pt educated on proper body mechanics with work and household activities. Issued yellow theraband to add to clamshell activity.    Person(s) Educated  Patient    Methods  Explanation    Comprehension  Verbalized understanding       PT Short Term Goals - 03/30/19 0859      PT SHORT TERM GOAL #1   Title  Patient will be independent in order to build upon functional gains made in threapy. ALL STGS DUE 03/29/19    Baseline  Pt is independent with functional mobility. She has been performing her exercises as instructed.    Time  4   4 weeks due to delay in scheduling   Period  Weeks    Status  Achieved    Target Date  03/29/19      PT SHORT TERM GOAL #2   Title  Pt will improve FGA from 15/30 to >18/30 for improved balance and decreased fall risk.    Baseline  15/30 on 03/04/2019, 03/30/2019 17/30    Time  4    Period  Weeks    Status  On-going      PT SHORT TERM GOAL #3   Title  Patient will perform 5x sit <> stand in 20 seconds or less with no UE support with minimal to no tension in low  back to demo improved LE strength.    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    Baseline  Pt reports that she is doing well everywhere except for standing on her kitchen floor. She can 3-5 min now statically    Time  4    Period  Weeks    Status  On-going        PT Long Term Goals - 03/04/19 2052      PT LONG TERM GOAL #1   Title  Patient will be independent in order to build upon functional gains made in therapy. ALL LTGS DUE 04/26/19    Time  8   8 weeks due to delay in scheduling   Period  Weeks    Status  New  PT LONG TERM GOAL #2   Title  Patient will report worst LBP to a 3/10 or less in order to be able to return to work.    Time  8    Period  Weeks    Status  New      PT LONG TERM GOAL #3   Title  Pt will increase FGA to 20 or higher for decreased fall risk and improved balance.    Baseline  15/30 on 03/04/2019    Time  8    Period  Weeks    Status  Revised      PT LONG TERM GOAL #4   Title  Patient will increase R hip ABD strength to at least a 4/5 in order to demo improved strength.    Baseline  3+/5 ON 02/27/19    Time  8    Period  Weeks    Status  New      PT LONG TERM GOAL #5   Title  Patient will be able to perform all lumbar AROM without increased pain in order to demo improved tolerance for activity.    Time  8    Period  Weeks    Status  New            Plan - 03/30/19 0934    Clinical Impression Statement  Pt's STG were reassessed today. She showed progress on FGA and 5 x sit to stand goals but just short of goals. Pt also standing for longer but static standing still limited to about 5 min before needing to move. Pt was instructed in proper body mechanics with work simulation activities today. Pt  continues to perform prone on elbows at home when back bothers her more that help some. Focused on more right LE strengthening again in session.    Personal Factors and Comorbidities  Comorbidity 2;Time since onset of injury/illness/exacerbation;Past/Current Experience    Comorbidities  diabetes mellitus, MS    Examination-Activity Limitations  Stand;Locomotion Level    Examination-Participation Restrictions  Community Activity   work - as a Sales executive  Stable/Uncomplicated    Rehab Potential  Good    PT Frequency  2x / week   followed by 1x week for 3 weeks   PT Duration  3 weeks    PT Treatment/Interventions  Aquatic Therapy;ADLs/Self Care Home Management;Balance training;Gait training;Stair training;Functional mobility training;Neuromuscular re-education;Therapeutic exercise;Therapeutic activities;Patient/family education;Passive range of motion;Spinal Manipulations;Joint Manipulations;Manual techniques;Dry needling    PT Next Visit Plan  Work more on right hip strengthening including eccentric control on step. Dynamic balance activities. I put in referral to get in pool for a couple sessions.    PT Home Exercise Plan  prone press up on elbows (forgot to save access code)    Consulted and Agree with Plan of Care  Patient       Patient will benefit from skilled therapeutic intervention in order to improve the following deficits and impairments:  Abnormal gait, Decreased activity tolerance, Decreased balance, Decreased mobility, Decreased strength, Difficulty walking, Decreased range of motion, Pain, Increased muscle spasms, Hypomobility  Visit Diagnosis: Chronic right-sided low back pain, unspecified whether sciatica present  Unsteadiness on feet  Other abnormalities of gait and mobility     Problem List Patient Active Problem List   Diagnosis Date Noted  . Low back pain 02/11/2019  . 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 Gait abnormality  07/10/2017  . Other dystonia 07/10/2017  . Anemia 07/27/2015  .  Restless leg syndrome 07/27/2015  . Dyslipidemia 07/26/2015  . 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 Obesity (BMI 30-39.9)   . Pain in the chest 04/05/2015  . Chest pain 04/05/2015  . NSTEMI (non-ST elevated myocardial infarction) ([place])   . Irregular menses 01/25/2014  . S/P cholecystectomy 06/02/2013  . IBS (irritable bowel syndrome) 06/02/2013  . Oligomenorrhea 01/25/2013  . Atypical nevi 01/25/2013  . Abnormal Pap smear 01/25/2013  . Multiple sclerosis ([place]) 09/02/2012  . Left knee pain 09/02/2012  . Abnormality of gait 09/02/2012    Electa Sniff, PT, DPT, NCS 03/30/2019, 9:39 AM  [facility] [address] [place], [place], [address] Phone: [phone]   Fax:  [phone]  Name: Kaylee Bryant MRN: [id number] Date of Birth: [birth year]

## 2019-04-02 ENCOUNTER — Encounter: Payer: Self-pay | Admitting: Physical Therapy

## 2019-04-02 ENCOUNTER — Ambulatory Visit: Payer: BC Managed Care – PPO | Admitting: Physical Therapy

## 2019-04-02 ENCOUNTER — Other Ambulatory Visit: Payer: Self-pay

## 2019-04-02 DIAGNOSIS — G8929 Other chronic pain: Secondary | ICD-10-CM | POA: Diagnosis not present

## 2019-04-02 DIAGNOSIS — R2689 Other abnormalities of gait and mobility: Secondary | ICD-10-CM | POA: Diagnosis not present

## 2019-04-02 DIAGNOSIS — M545 Low back pain: Secondary | ICD-10-CM | POA: Diagnosis not present

## 2019-04-02 DIAGNOSIS — R2681 Unsteadiness on feet: Secondary | ICD-10-CM

## 2019-04-02 NOTE — Patient Instructions (Addendum)
Access Code: [email or web address]  URL: [email or web address]  Date: 04/02/2019  Prepared by: Janann August   Exercises Prone Press Up on Elbows - 10 reps - 1-2 sets - 3x daily - 6-7x weekly Cat-Camel - 10 reps - 2 sets - 2x daily - 6-7x weekly Seated Hamstring Stretch - 2 sets - 20-30 hold - 3x daily - 6-7x weekly Supine Posterior Pelvic Tilt - 10 reps - 1-2 sets - 3x daily - 6-7x weekly Bent Knee Fallouts - 10 reps - 2 sets - 1x daily - 7x weekly Supine Lower Trunk Rotation - 4 reps - 1 sets - 30 sec hold - 1x daily - 7x weekly Supine Piriformis Stretch with Foot on Ground - 4 reps - 1 sets - 30 hold - 1x daily - 7x weekly Tandem Stance - 2 reps - 1 sets - 30 sec hold - 2x daily - 7x weekly Romberg Stance with Eyes Closed - 2 reps - 1 sets - 30 sec hold - 2x daily - 7x weekly Standing with Head Rotation - 10 reps - 1 sets - 2x daily - 7x weekly Walking March - 10 reps - 3 sets - 1x daily - 7x weekly Standing Toe Taps - 10 reps - 3 sets - 1x daily - 7x weekly Clamshell - 10 reps - 2 sets - 1x daily - 5x weekly Supine Bridge - 10 reps - 2 sets - 1x daily - 5x weekly

## 2019-04-02 NOTE — Therapy (Signed)
[facility] [address] [place], [place], [address] Phone: [phone]   Fax:  [phone]  Physical Therapy Treatment  Patient Details  Name: Kaylee Bryant MRN: [id number] Date of Birth: 11-08-1965 Referring Provider (PT): Marcial Pacas, MD   Encounter Date: 04/02/2019  PT End of Session - 04/02/19 0931    Visit Number  5    Number of Visits  10    Date for PT Re-Evaluation  04/30/19    Authorization Type  [employer] [place]    PT Start Time  [phone]    PT Stop Time  [phone]    PT Time Calculation (min)  42 min    Equipment Utilized During Treatment  Gait belt    Activity Tolerance  Patient tolerated treatment well    Behavior During Therapy  [facility] for tasks assessed/performed       Past Medical History:  Diagnosis Date  . 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 DDD (degenerative disc disease), lumbar   . DM (diabetes mellitus), gestational 2006   insulin at the time  . Fatigue   . Heart attack ([place])   . IBS (irritable bowel syndrome)    with chronic constipation  . Multiple sclerosis ([place])   . Obesity   . 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 CARDIAC CATHETERIZATION N/A 04/05/2015   Procedure: Left Heart Cath and Coronary Angiography;  Surgeon: Troy Sine, MD;  Location: [facility];  Service: Cardiovascular;  Laterality: N/A;  . CERVICAL CONE BIOPSY  2004  . [place], 2006  . CHOLECYSTECTOMY     1988  . COLPOSCOPY  2004  . CORONARY ARTERY BYPASS GRAFT N/A 04/07/2015   Procedure: CORONARY ARTERY BYPASS GRAFTING (CABG), OFF PUMP, TIMES ONE, USING LEFT INTERNAL MAMMARY ARTERY;  Surgeon: Rexene Alberts, MD;  Location: [facility];  Service: Open Heart Surgery;  Laterality: N/A;  LIMA to LAD  . TEE WITHOUT  CARDIOVERSION N/A 04/07/2015   Procedure: TRANSESOPHAGEAL ECHOCARDIOGRAM (TEE);  Surgeon: Rexene Alberts, MD;  Location: [facility];  Service: Open Heart Surgery;  Laterality: N/A;    There were no vitals filed for this visit.  Subjective Assessment - 04/02/19 0847    Subjective  Will be starting Ocrevus in about 2 weeks. No falls. Still has episodes in the evening where the spasms go down the leg. Back is about the same - prone press ups are the best.    Pertinent History  PMH: diabetes mellitus, MS    How long can you stand comfortably?  only a few minutes    How long can you walk comfortably?  after grocery shopping at [facility] even when pushing a cart    Patient Stated Goals  relieve the pain, dont want to live on pain medication all the time.    Currently in Pain?  No/denies                       [facility] Adult PT Treatment/Exercise - 04/02/19 0001      Ambulation/Gait   Ambulation/Gait  Yes    Ambulation/Gait Assistance  5: Supervision    Ambulation/Gait Assistance Details  Gait with head turns and asking pt to scan environment  to find objects of different colors in therapy gym, no LOB, cues to maintain and increase gait speed     Ambulation Distance (Feet)  230 Feet    Assistive device  None    Gait Pattern  Step-through pattern;Decreased arm swing - left;Decreased arm swing - right;Decreased trunk rotation    Ambulation Surface  Level;Indoor      Neuro Re-ed    Neuro Re-ed Details   Balance on foam in corner: wider BOS to feet hip width distance eyes closed multiple reps, progressing to 20-30 seconds. Wide BOS eyes closed with head turns 3 x 5 reps, pt performing slowly. On blue/red mats gait with head turns right and left needing UE support to perform, slowed gait speed need for countertop support down and back 4 reps. Pt rating 3-4/10 dizziness after performing, but subsided immediately. Down and back 4 reps slow marching with focus on SLS, intermittent UE support for  balance - cues for core activation.      Exercises   Other Exercises   Quadraped cat camel 1 x 10 reps, prone press up onto hands vs. elbows, cues for technique 1 x 10 reps, seated hamstring stretch 2 x 30 seconds B. Pt reporting relief from all exercises - added to HEP. Attempted child's pose, however pt reported increased discomfort in B knees.              PT Education - 04/02/19 1653    Education Details  lumbar stretches/ROM additions to HEP.    Person(s) Educated  Patient    Methods  Explanation;Demonstration;Handout    Comprehension  Verbalized understanding;Returned demonstration       PT Short Term Goals - 03/30/19 0859      PT SHORT TERM GOAL #1   Title  Patient will be independent in order to build upon functional gains made in threapy. ALL STGS DUE 03/29/19    Baseline  Pt is independent with functional mobility. She has been performing her exercises as instructed.    Time  4   4 weeks due to delay in scheduling   Period  Weeks    Status  Achieved    Target Date  03/29/19      PT SHORT TERM GOAL #2   Title  Pt will improve FGA from 15/30 to >18/30 for improved balance and decreased fall risk.    Baseline  15/30 on 03/04/2019, 03/30/2019 17/30    Time  4    Period  Weeks    Status  On-going      PT SHORT TERM GOAL #3   Title  Patient will perform 5x sit <> stand in 20 seconds or less with no UE support with minimal to no tension in low back to demo improved LE strength.    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    Baseline  Pt reports that she is doing well everywhere except for standing on her kitchen floor. She can 3-5 min now statically    Time  4    Period  Weeks    Status  On-going        PT Long  Term Goals -  03/04/19 2052      PT LONG TERM GOAL #1   Title  Patient will be independent in order to build upon functional gains made in therapy. ALL LTGS DUE 04/26/19    Time  8   8 weeks due to delay in scheduling   Period  Weeks    Status  New      PT LONG TERM GOAL #2   Title  Patient will report worst LBP to a 3/10 or less in order to be able to return to work.    Time  8    Period  Weeks    Status  New      PT LONG TERM GOAL #3   Title  Pt will increase FGA to 20 or higher for decreased fall risk and improved balance.    Baseline  15/30 on 03/04/2019    Time  8    Period  Weeks    Status  Revised      PT LONG TERM GOAL #4   Title  Patient will increase R hip ABD strength to at least a 4/5 in order to demo improved strength.    Baseline  3+/5 ON 02/27/19    Time  8    Period  Weeks    Status  New      PT LONG TERM GOAL #5   Title  Patient will be able to perform all lumbar AROM without increased pain in order to demo improved tolerance for activity.    Time  8    Period  Weeks    Status  New            Plan - 04/02/19 1659    Clinical Impression Statement  Focus of today's skilled session was addition of lumbar ROM exercises to HEP, gait training, and balance training on compliant surfaces. Pt has difficulty with balance with head turns and reports 3-4/10 dizziness when performing, but subsides immediately afterwards. Will continue to progress towards LTGs.    Personal Factors and Comorbidities  Comorbidity 2;Time since onset of injury/illness/exacerbation;Past/Current Experience    Comorbidities  diabetes mellitus, MS    Examination-Activity Limitations  Stand;Locomotion Level    Examination-Participation Restrictions  Community Activity   work - as a Sales executive  Stable/Uncomplicated    Rehab Potential  Good    PT Frequency  2x / week   followed by 1x week for 3 weeks   PT Duration  3 weeks    PT Treatment/Interventions  Aquatic  Therapy;ADLs/Self Care Home Management;Balance training;Gait training;Stair training;Functional mobility training;Neuromuscular re-education;Therapeutic exercise;Therapeutic activities;Patient/family education;Passive range of motion;Spinal Manipulations;Joint Manipulations;Manual techniques;Dry needling    PT Next Visit Plan  Work more on right hip strengthening including eccentric control on step. Balance with head turns and balance with eyes closed. Dynamic balance activities. I put in referral to get in pool for a couple sessions (i think suzanne said the week of the 18th due to recent pool closure).    PT Home Exercise Plan  [id number] and Agree with Plan of Care  Patient       Patient will benefit from skilled therapeutic intervention in order to improve the following deficits and impairments:  Abnormal gait, Decreased activity tolerance, Decreased balance, Decreased mobility, Decreased strength, Difficulty walking, Decreased range of motion, Pain, Increased muscle spasms, Hypomobility  Visit Diagnosis: Chronic right-sided low back pain, unspecified whether sciatica present  Other abnormalities of gait  and mobility  Unsteadiness on feet     Problem List Patient Active Problem List   Diagnosis Date Noted  . Low back pain 02/11/2019  . 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 Gait abnormality 07/10/2017  . Other dystonia 07/10/2017  . Anemia 07/27/2015  . Restless leg syndrome 07/27/2015  . Dyslipidemia 07/26/2015  . 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 Obesity (BMI 30-39.9)   . Pain in the chest 04/05/2015  . Chest pain 04/05/2015  . NSTEMI (non-ST elevated myocardial infarction) ([place])   . Irregular menses 01/25/2014  . S/P cholecystectomy 06/02/2013  . IBS (irritable bowel syndrome) 06/02/2013  . Oligomenorrhea 01/25/2013  . Atypical nevi 01/25/2013  . Abnormal Pap smear 01/25/2013  . Multiple sclerosis ([place]) 09/02/2012  . Left knee pain 09/02/2012  . Abnormality  of gait 09/02/2012    Arliss Journey, PT, DPT  04/02/2019, 5:01 PM  [facility] [address] [place], [place], [address] Phone: [phone]   Fax:  [phone]  Name: Kaylee Bryant MRN: [id number] Date of Birth: [birth year]

## 2019-04-06 ENCOUNTER — Ambulatory Visit: Payer: BC Managed Care – PPO

## 2019-04-09 ENCOUNTER — Ambulatory Visit: Payer: BC Managed Care – PPO | Admitting: Physical Therapy

## 2019-04-14 ENCOUNTER — Ambulatory Visit: Payer: BC Managed Care – PPO | Admitting: Physical Therapy

## 2019-04-16 ENCOUNTER — Ambulatory Visit: Payer: BC Managed Care – PPO | Admitting: Physical Therapy

## 2019-04-16 DIAGNOSIS — G35 Multiple sclerosis: Secondary | ICD-10-CM | POA: Diagnosis not present

## 2019-04-22 ENCOUNTER — Ambulatory Visit: Payer: BC Managed Care – PPO | Admitting: Physical Therapy

## 2019-04-27 ENCOUNTER — Other Ambulatory Visit: Payer: Self-pay | Admitting: Neurology

## 2019-04-29 ENCOUNTER — Ambulatory Visit: Payer: BC Managed Care – PPO | Admitting: Physical Therapy

## 2019-04-30 DIAGNOSIS — G35 Multiple sclerosis: Secondary | ICD-10-CM | POA: Diagnosis not present

## 2019-05-01 ENCOUNTER — Encounter: Payer: Self-pay | Admitting: Physical Therapy

## 2019-05-01 NOTE — Therapy (Signed)
[facility] [address] [place], [place], [address] Phone: [phone]   Fax:  [phone]  Patient Details  Name: Kaylee Bryant MRN: [id number] Date of Birth: 08-Nov-1965 Referring Provider:  No ref. provider found  Encounter Date: 05/01/2019   Patient called and stated she needed to cancel all future PT appointments due to going back to work.  PHYSICAL THERAPY DISCHARGE SUMMARY  Visits from Start of Care: 5  Current functional level related to goals / functional outcomes: FGA on 03/30/19 was 17/30 - pt still at a high risk of falls, 5x sit <>stand time of 22 seconds on 03/27/19 indicates decr functional LE strength.    Remaining deficits: Impaired balance, low back pain, decr functional LE strength.    Education / Equipment: HEP  Plan: Patient agrees to discharge.  Patient goals were not met. Patient is being discharged due to the patient's request.  ?????       Arliss Journey, PT, DPT  05/01/2019, 8:31 AM  [facility] [address] [place], [place], [address] Phone: [phone]   Fax:  [phone]

## 2019-05-06 DIAGNOSIS — Z1231 Encounter for screening mammogram for malignant neoplasm of breast: Secondary | ICD-10-CM | POA: Diagnosis not present

## 2019-05-29 ENCOUNTER — Other Ambulatory Visit: Payer: Self-pay | Admitting: Neurology

## 2019-06-01 ENCOUNTER — Other Ambulatory Visit: Payer: Self-pay | Admitting: Cardiovascular Disease

## 2019-06-02 ENCOUNTER — Other Ambulatory Visit: Payer: Self-pay | Admitting: Cardiovascular Disease

## 2019-06-19 ENCOUNTER — Encounter: Payer: Self-pay | Admitting: Cardiovascular Disease

## 2019-06-19 ENCOUNTER — Ambulatory Visit: Payer: BC Managed Care – PPO | Admitting: Cardiovascular Disease

## 2019-06-19 ENCOUNTER — Other Ambulatory Visit: Payer: Self-pay

## 2019-06-19 VITALS — BP 162/90 | HR 65 | Ht 63.0 in | Wt 208.0 lb

## 2019-06-19 DIAGNOSIS — Z79899 Other long term (current) drug therapy: Secondary | ICD-10-CM | POA: Diagnosis not present

## 2019-06-19 DIAGNOSIS — I214 Non-ST elevation (NSTEMI) myocardial infarction: Secondary | ICD-10-CM | POA: Diagnosis not present

## 2019-06-19 DIAGNOSIS — E785 Hyperlipidemia, unspecified: Secondary | ICD-10-CM | POA: Diagnosis not present

## 2019-06-19 DIAGNOSIS — E1159 Type 2 diabetes mellitus with other circulatory complications: Secondary | ICD-10-CM | POA: Insufficient documentation

## 2019-06-19 DIAGNOSIS — I10 Essential (primary) hypertension: Secondary | ICD-10-CM

## 2019-06-19 DIAGNOSIS — I152 Hypertension secondary to endocrine disorders: Secondary | ICD-10-CM | POA: Insufficient documentation

## 2019-06-19 MED ORDER — LISINOPRIL 10 MG PO TABS: 10.0000 mg | ORAL_TABLET | Freq: Every day | ORAL | 3 refills | Status: DC

## 2019-06-19 NOTE — Patient Instructions (Addendum)
Medication Instructions:  Increase Lisinopril to 10 mg daily   *If you need a refill on your cardiac medications before your next appointment, please call your pharmacy*   Lab Work: Your physician recommends that you return for lab work  (Fasting Lipid, LFT)  If you have labs (blood work) drawn today and your tests are completely normal, you will receive your results only by: Marland Kitchen MyChart Message (if you have MyChart) OR . A paper copy in the mail If you have any lab test that is abnormal or we need to change your treatment, we will call you to review the results.   Testing/Procedures: None   Follow-Up: At [facility], you and your health needs are our priority.  As part of our continuing mission to provide you with exceptional heart care, we have created designated Provider Care Teams.  These Care Teams include your primary Cardiologist (physician) and Advanced Practice Providers (APPs -  Physician Assistants and Nurse Practitioners) who all work together to provide you with the care you need, when you need it.  We recommend signing up for the patient portal called "MyChart".  Sign up information is provided on this After Visit Summary.  MyChart is used to connect with patients for Virtual Visits (Telemedicine).  Patients are able to view lab/test results, encounter notes, upcoming appointments, etc.  Non-urgent messages can be sent to your provider as well.   To learn more about what you can do with MyChart, go to [email or web address].    Your next appointment:   1 year(s)  The format for your next appointment:   In Person  Provider:   Quay Burow, MD  Your physician recommends that you schedule a follow-up appointment in 1 month with hypertension clinic.

## 2019-06-19 NOTE — Assessment & Plan Note (Signed)
History of dyslipidemia on statin therapy.  We will recheck a lipid liver profile. 

## 2019-06-19 NOTE — Assessment & Plan Note (Signed)
History of essential hypertension on lisinopril 5 mg daily blood pressure measured today 162/90.  She has been having headaches off and on recently and has checked her blood pressure at home demonstrating elevated blood pressure.  She is also gained some weight over the last several months.  Going to increase her lisinopril from 5 to 10 mg a day.  She will keep a 30-day blood pressure log and we will see Kaylee Bryant back in 4 weeks to review make appropriate medication adjustments.

## 2019-06-19 NOTE — Assessment & Plan Note (Signed)
History of CAD status post non-STEMI 04/05/2015 with cardiac catheterization performed by Dr. Claiborne Billings revealing a 95% ostial LAD lesion.  She underwent off pump LIMA placement by Dr. Roxy Manns 2 days later and has done well since.

## 2019-06-19 NOTE — Progress Notes (Signed)
06/19/2019 Kaylee Bryant   Mar 21, 1966  [id number]  Primary Physician Maurice Small, MD Primary Cardiologist: Lorretta Harp MD [name], [place]  HPI:  Kaylee Bryant is a 54 y.o.  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 She had a non-STEMI on 04/05/15 with unstable angina. She is catheterized by Dr. Claiborne Billings revealing a 95% ostial LAD lesion and 2 days later underwent off-pump LIMA placement by Dr. Roxy Manns. She participated in cardiac rehabilitation briefly.  Since I saw her a year and a half ago she is done well.    She has complained of some headaches recently has demonstrated elevated blood pressures at home.  She denies chest pain or shortness of breath.  Current Meds  Medication Sig  . acetaminophen (TYLENOL) 500 MG tablet Take 1,000 mg by mouth every 6 (six) hours as needed for mild pain.  Marland Kitchen aspirin EC 81 MG tablet Take 81 mg by mouth daily.  Marland Kitchen atorvastatin (LIPITOR) 80 MG tablet TAKE 1 TABLET (80 MG TOTAL) BY MOUTH DAILY AT 6 PM. NEED APPOINTMENT FOR FUTURE REFILL  . baclofen (LIORESAL) 10 MG tablet Take 2 tablets (20 mg total) by mouth 3 (three) times daily.  Marland Kitchen docusate sodium (COLACE) 100 MG capsule Take 100 mg by mouth 2 (two) times daily.  Marland Kitchen gabapentin (NEURONTIN) 300 MG capsule TAKE 2 CAPSULES (600 MG TOTAL) BY MOUTH 3 (THREE) TIMES DAILY.  Marland Kitchen glipiZIDE (GLUCOTROL XL) 5 MG 24 hr tablet Take 10 mg by mouth daily. with food  . lisinopril (ZESTRIL) 5 MG tablet TAKE 1 TABLET BY MOUTH EVERY DAY  . metFORMIN (GLUCOPHAGE-XR) 500 MG 24 hr tablet Take 1,000 mg by mouth 2 (two) times daily.   . metoprolol tartrate (LOPRESSOR) 25 MG tablet TAKE 1 TABLET BY MOUTH TWICE A DAY  . ocrelizumab (OCREVUS) 300 MG/10ML injection Inject 300 mg into the vein as directed.  Marland Kitchen tiZANidine  (ZANAFLEX) 2 MG tablet TAKE 1 TABLET (2 MG TOTAL) BY MOUTH 3 (THREE) TIMES DAILY.  Marland Kitchen venlafaxine XR (EFFEXOR-XR) 75 MG 24 hr capsule TAKE 1 CAPSULE (75 MG TOTAL) BY MOUTH DAILY WITH BREAKFAST.     No Known Allergies  Social History   Socioeconomic History  . Marital status: Married    Spouse name: Not on file  . Number of children: 4  . Years of education: college  . Highest education level: Not on file  Occupational History  . Occupation: Programmer, multimedia: [employer]  Tobacco Use  . Smoking status: Former Smoker    Packs/day: 1.50    Years: 15.00    Pack years: 22.50    Types: Cigarettes    Quit date: 02/03/2004    Years since quitting: 15.3  . Smokeless tobacco: Never Used  Substance and Sexual Activity  . Alcohol use: Yes    Alcohol/week: 1.0 standard drinks    Types: 1 Glasses of wine per week  . Drug use: No  . Sexual activity: Yes    Partners: Male    Birth control/protection: Surgical    Comment: vasectomy  Other Topics Concern  . Not on file  Social History Narrative   Patient lives at home with her husband and children. Patient works for the [employer] in [place].  College education.    Social Determinants of Health   Financial Resource Strain:   . Difficulty of Paying Living Expenses:   Food Insecurity:   . Worried About Charity fundraiser in the Last Year:   . Arboriculturist in the Last Year:   Transportation Needs:   . Film/video editor (Medical):   Marland Kitchen Lack of Transportation (Non-Medical):   Physical Activity:   . Days of Exercise per Week:   . Minutes of Exercise per Session:   Stress:   . Feeling of Stress :   Social Connections:   . Frequency of Communication with Friends and Family:   . Frequency of Social Gatherings with Friends and Family:   . Attends Religious Services:   . Active Member of Clubs or Organizations:   . Attends Archivist Meetings:   Marland Kitchen Marital Status:   Intimate Partner Violence:   . Fear of  Current or Ex-Partner:   . Emotionally Abused:   Marland Kitchen Physically Abused:   . Sexually Abused:      Review of Systems: General: negative for chills, fever, night sweats or weight changes.  Cardiovascular: negative for chest pain, dyspnea on exertion, edema, orthopnea, palpitations, paroxysmal nocturnal dyspnea or shortness of breath Dermatological: negative for rash Respiratory: negative for cough or wheezing Urologic: negative for hematuria Abdominal: negative for nausea, vomiting, diarrhea, bright red blood per rectum, melena, or hematemesis Neurologic: negative for visual changes, syncope, or dizziness All other systems reviewed and are otherwise negative except as noted above.    Blood pressure (!) 162/90, pulse 65, height 5\' 3"  (1.6 m), weight 208 lb (94.3 kg), last menstrual period 10/03/2014.  General appearance: alert and no distress Neck: no adenopathy, no carotid bruit, no JVD, supple, symmetrical, trachea midline and thyroid not enlarged, symmetric, no tenderness/mass/nodules Lungs: clear to auscultation bilaterally Heart: regular rate and rhythm, S1, S2 normal, no murmur, click, rub or gallop Extremities: extremities normal, atraumatic, no cyanosis or edema Pulses: 2+ and symmetric Skin: Skin color, texture, turgor normal. No rashes or lesions Neurologic: Alert and oriented X 3, normal strength and tone. Normal symmetric reflexes. Normal coordination and gait  EKG sinus rhythm at 65 with early progression.  I Personally reviewed this EKG.  ASSESSMENT AND PLAN:   NSTEMI (non-ST elevated myocardial infarction) ([place]) History of CAD status post non-STEMI 04/05/2015 with cardiac catheterization performed by Dr. Claiborne Billings revealing a 95% ostial LAD lesion.  She underwent off pump LIMA placement by Dr. Roxy Manns 2 days later and has done well since.  Dyslipidemia History of dyslipidemia on statin therapy.  We will recheck a lipid liver profile  Essential hypertension History of  essential hypertension on lisinopril 5 mg daily blood pressure measured today 162/90.  She has been having headaches off and on recently and has checked her blood pressure at home demonstrating elevated blood pressure.  She is also gained some weight over the last several months.  Going to increase her lisinopril from 5 to 10 mg a day.  She will keep a 30-day blood pressure log and we will see Kristen back in 4 weeks to review make appropriate medication adjustments.      Lorretta Harp MD FACP,FACC,FAHA, [facility] 06/19/2019 10:16 AM

## 2019-06-28 ENCOUNTER — Other Ambulatory Visit: Payer: Self-pay | Admitting: Cardiovascular Disease

## 2019-07-05 ENCOUNTER — Other Ambulatory Visit: Payer: Self-pay | Admitting: Cardiovascular Disease

## 2019-07-06 NOTE — Progress Notes (Deleted)
Patient ID: LICET EBNET                 DOB: Aug 09, 1965                      MRN: [id number]     HPI: Kaylee Bryant is a 54 y.o. female referred by Dr.Berry  to HTN clinic. PMH includes NSTEMI 04/05/2015, dyslipidemia, hypertension, IBS, and dyslipidemia.   Current HTN meds:  Lisinopril 10mg  daily  (increased from 5mg  daily on 06/19/2019)  Previously tried:   BP goal: <130/80  Family History:   Social History:   Diet:   Exercise:   Home BP readings:   Wt Readings from Last 3 Encounters:  06/19/19 208 lb (94.3 kg)  02/11/19 199 lb (90.3 kg)  05/30/18 217 lb (98.4 kg)   BP Readings from Last 3 Encounters:  06/19/19 (!) 162/90  02/11/19 128/78  05/30/18 116/70   Pulse Readings from Last 3 Encounters:  06/19/19 65  02/11/19 64  05/30/18 (!) 55    Renal function: CrCl cannot be calculated (Patient's most recent lab result is older than the maximum 21 days allowed.).  Past Medical History:  Diagnosis Date  . 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 Coronary artery disease involving native coronary artery with unstable angina pectoris ([place]) 04/05/2015  . DDD (degenerative disc disease), lumbar   . DM (diabetes mellitus), gestational 2006   insulin at the time  . Fatigue   . Heart attack ([place])   . IBS (irritable bowel syndrome)    with chronic constipation  . Multiple sclerosis ([place])   . Obesity   . 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 acetaminophen (TYLENOL) 500 MG tablet Take 1,000 mg by mouth every 6 (six) hours as needed for mild pain.    Marland Kitchen aspirin EC 81 MG tablet Take 81 mg by mouth daily.    Marland Kitchen atorvastatin (LIPITOR) 80 MG tablet TAKE 1 TABLET (80 MG TOTAL) BY MOUTH DAILY AT 6 PM. 90 tablet 3  . baclofen (LIORESAL) 10 MG tablet Take 2 tablets (20 mg total) by mouth 3 (three) times daily. 540 tablet 4  . docusate sodium (COLACE) 100 MG capsule Take 100 mg  by mouth 2 (two) times daily.    Marland Kitchen gabapentin (NEURONTIN) 300 MG capsule TAKE 2 CAPSULES (600 MG TOTAL) BY MOUTH 3 (THREE) TIMES DAILY. 540 capsule 3  . glipiZIDE (GLUCOTROL XL) 5 MG 24 hr tablet Take 10 mg by mouth daily. with food  3  . lisinopril (ZESTRIL) 10 MG tablet Take 1 tablet (10 mg total) by mouth daily. 90 tablet 3  . metFORMIN (GLUCOPHAGE-XR) 500 MG 24 hr tablet Take 1,000 mg by mouth 2 (two) times daily.     . metoprolol tartrate (LOPRESSOR) 25 MG tablet TAKE 1 TABLET BY MOUTH TWICE A DAY 60 tablet 5  . ocrelizumab (OCREVUS) 300 MG/10ML injection Inject 300 mg into the vein as directed.    Marland Kitchen tiZANidine (ZANAFLEX) 2 MG tablet TAKE 1 TABLET (2 MG TOTAL) BY MOUTH 3 (THREE) TIMES DAILY. 270 tablet 2  . venlafaxine XR (EFFEXOR-XR) 75 MG 24 hr capsule TAKE 1 CAPSULE (75 MG TOTAL) BY MOUTH DAILY WITH BREAKFAST. 90 capsule 3   No current facility-administered medications on file prior to visit.    No Known Allergies  Last menstrual period 10/03/2014.  No problem-specific Assessment & Plan notes found for this encounter.    Tymel Conely Rodriguez-Guzman PharmD, BCPS, [place] [address] 07/06/2019 4:34 PM

## 2019-07-07 ENCOUNTER — Ambulatory Visit (INDEPENDENT_AMBULATORY_CARE_PROVIDER_SITE_OTHER): Payer: BC Managed Care – PPO | Admitting: Pharmacist Clinician (PhC)/ Clinical Pharmacy Specialist

## 2019-07-07 ENCOUNTER — Other Ambulatory Visit: Payer: Self-pay

## 2019-07-07 ENCOUNTER — Encounter: Payer: Self-pay | Admitting: Pharmacist Clinician (PhC)/ Clinical Pharmacy Specialist

## 2019-07-07 VITALS — BP 122/70 | HR 58 | Ht 63.0 in | Wt 208.8 lb

## 2019-07-07 DIAGNOSIS — I10 Essential (primary) hypertension: Secondary | ICD-10-CM | POA: Diagnosis not present

## 2019-07-07 MED ORDER — LISINOPRIL 10 MG PO TABS: 10.0000 mg | ORAL_TABLET | Freq: Two times a day (BID) | ORAL | 3 refills | Status: DC

## 2019-07-07 NOTE — Assessment & Plan Note (Addendum)
Patient with essential hypertension, still not to goal despite increase in lisinopril to 10 mg daily.  Will have her increase lisinopril to 10 mg bid, taking with the metoprolol doses.  She will continue to check home BP readings 3-4 times per week and was given instruction on correct BP technique.  We will see her again in 6 weeks for follow up.

## 2019-07-07 NOTE — Patient Instructions (Signed)
Return for a a follow up appointment on June 1 at 8 am  Go to the lab in 2 weeks to check kidney function and cholesterol (fasting)  Check your blood pressure at home twice daily, several days each week and keep record of the readings.  Take your BP meds as follows:  Increase lisinopril to 10 mg twice daily  Continue with all other medications  Bring all of your meds, your BP cuff and your record of home blood pressures to your next appointment.  Exercise as you're able, try to walk approximately 30 minutes per day.  Keep salt intake to a minimum, especially watch canned and prepared boxed foods.  Eat more fresh fruits and vegetables and fewer canned items.  Avoid eating in fast food restaurants.    HOW TO TAKE YOUR BLOOD PRESSURE: . Rest 5 minutes before taking your blood pressure. .  Don't smoke or drink caffeinated beverages for at least 30 minutes before. . Take your blood pressure before (not after) you eat. . Sit comfortably with your back supported and both feet on the floor (don't cross your legs). . Elevate your arm to heart level on a table or a desk. . Use the proper sized cuff. It should fit smoothly and snugly around your bare upper arm. There should be enough room to slip a fingertip under the cuff. The bottom edge of the cuff should be 1 inch above the crease of the elbow. . Ideally, take 3 measurements at one sitting and record the average.

## 2019-07-07 NOTE — Progress Notes (Signed)
07/07/2019 Kaylee Bryant 06-Dec-1965 [id number]   HPI:  Kaylee Bryant is a 54 y.o. female patient of Dr Gwenlyn Found, with a PMH below who presents today for hypertension clinic evaluation.  When she saw Dr. Gwenlyn Found last month her BP was elevated at 162/90 and he increased her lisinopril from 5 to 10 mg daily.  She has been checking home BP readings regularly since then, and although they are lower than the 162/90, the home readings are still elevated.    Patient reports feeling well today.  No concerns with her medications.    Past Medical History: ASCVD NSTEMI, CABG x 1   Multiple sclerosis Recently started Ocrevus q6 months  hyperlipidemia 05/2018- TC 109, TG 106, HDL 30, LDL 58 - on atorvastatin 80 mg  DM2 A1c 9.3 (09/2017) - on glipizide and metformin     Blood Pressure Goal:  130/80   Current Medications: lisinopril 10 mg daily, metoprolol tart 25 mg bid  Family Hx:  both parents with hypertension, father died at 77 mechanical fall, had CHF; mother DM, HTN, well controlled; sister with hypertension; 1 daughter with Crohn's disease, others fine   Social Hx: quit tobacco in 2005; no alcohol; coffee, 2 cans diet dr. pepper per day; lots of water due to tizanidine;   Diet: mix of home and take-out at least once weekly; no added salt; trying hard to get veggies in diet; snakcs consist of popcorn  Exercise: no regular exercise  Home BP readings: has newer Omron meter, bought about 1 year ago.  Home readings show morning average (15) at 141/84 and evening (18) at 137/79.  Range for all readings was 116-155/74-94  Intolerances: nkda  Labs: 12/2018:  Na 139, K 4.7, Glu 278, BUN 10, SCr 0.68  Wt Readings from Last 3 Encounters:  07/07/19 208 lb 12.8 oz (94.7 kg)  06/19/19 208 lb (94.3 kg)  02/11/19 199 lb (90.3 kg)   BP Readings from Last 3 Encounters:  07/07/19 122/70  06/19/19 (!) 162/90  02/11/19 128/78   Pulse Readings from Last 3 Encounters:  07/07/19 (!) 58  06/19/19 65    02/11/19 64    Current Outpatient Medications  Medication Sig Dispense Refill  . acetaminophen (TYLENOL) 500 MG tablet Take 1,000 mg by mouth every 6 (six) hours as needed for mild pain.    Marland Kitchen aspirin EC 81 MG tablet Take 81 mg by mouth daily.    Marland Kitchen atorvastatin (LIPITOR) 80 MG tablet TAKE 1 TABLET (80 MG TOTAL) BY MOUTH DAILY AT 6 PM. 90 tablet 3  . baclofen (LIORESAL) 10 MG tablet Take 2 tablets (20 mg total) by mouth 3 (three) times daily. 540 tablet 4  . docusate sodium (COLACE) 100 MG capsule Take 100 mg by mouth 2 (two) times daily.    Marland Kitchen gabapentin (NEURONTIN) 300 MG capsule TAKE 2 CAPSULES (600 MG TOTAL) BY MOUTH 3 (THREE) TIMES DAILY. 540 capsule 3  . glipiZIDE (GLUCOTROL XL) 5 MG 24 hr tablet Take 10 mg by mouth daily. with food  3  . metFORMIN (GLUCOPHAGE-XR) 500 MG 24 hr tablet Take 1,000 mg by mouth 2 (two) times daily.     . metoprolol tartrate (LOPRESSOR) 25 MG tablet TAKE 1 TABLET BY MOUTH TWICE A DAY 60 tablet 5  . ocrelizumab (OCREVUS) 300 MG/10ML injection Inject 300 mg into the vein as directed.    Marland Kitchen tiZANidine (ZANAFLEX) 2 MG tablet TAKE 1 TABLET (2 MG TOTAL) BY MOUTH 3 (THREE) TIMES DAILY.  270 tablet 2  . venlafaxine XR (EFFEXOR-XR) 75 MG 24 hr capsule TAKE 1 CAPSULE (75 MG TOTAL) BY MOUTH DAILY WITH BREAKFAST. 90 capsule 3  . lisinopril (ZESTRIL) 10 MG tablet Take 1 tablet (10 mg total) by mouth in the morning and at bedtime. 180 tablet 3   No current facility-administered medications for this visit.    No Known Allergies  Past Medical History:  Diagnosis Date  . 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 Coronary artery disease involving native coronary artery with unstable angina pectoris ([place]) 04/05/2015  . DDD (degenerative disc disease), lumbar   . DM (diabetes mellitus), gestational 2006   insulin at the time  . Fatigue   . Heart attack ([place])   . IBS (irritable bowel syndrome)    with chronic constipation  . Multiple sclerosis ([place])   . Obesity   .  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  Essential hypertension Patient with essential hypertension, still not to goal despite increase in lisinopril to 10 mg daily.  Will have her increase lisinopril to 10 mg bid, taking with the metoprolol doses.  She will continue to check home BP readings 3-4 times per week and was given instruction on correct BP technique.  We will see her again in 6 weeks for follow up.     Tommy Medal PharmD CPP [facility] [address] [place] [employer], [place] [address] [phone]

## 2019-07-10 ENCOUNTER — Ambulatory Visit: Payer: BC Managed Care – PPO | Admitting: Medical

## 2019-07-29 DIAGNOSIS — Z79899 Other long term (current) drug therapy: Secondary | ICD-10-CM | POA: Diagnosis not present

## 2019-07-29 DIAGNOSIS — I10 Essential (primary) hypertension: Secondary | ICD-10-CM | POA: Diagnosis not present

## 2019-07-29 LAB — BASIC METABOLIC PANEL
BUN/Creatinine Ratio: 14 (ref 9–23)
BUN: 10 mg/dL (ref 6–24)
CO2: 26 mmol/L (ref 20–29)
Calcium: 9.1 mg/dL (ref 8.7–10.2)
Chloride: 101 mmol/L (ref 96–106)
Creatinine, Ser: 0.71 mg/dL (ref 0.57–1.00)
GFR calc Af Amer: 112 mL/min/{1.73_m2} (ref >59)
GFR calc non Af Amer: 98 mL/min/{1.73_m2} (ref >59)
Glucose: 293 mg/dL — ABNORMAL HIGH (ref 65–99)
Potassium: 4.3 mmol/L (ref 3.5–5.2)
Sodium: 141 mmol/L (ref 134–144)

## 2019-07-29 LAB — LIPID PANEL
Chol/HDL Ratio: 3.8 ratio (ref 0.0–4.4)
Cholesterol, Total: 110 mg/dL (ref 100–199)
HDL: 29 mg/dL — ABNORMAL LOW (ref >39)
LDL Chol Calc (NIH): 60 mg/dL (ref 0–99)
Triglycerides: 113 mg/dL (ref 0–149)
VLDL Cholesterol Cal: 21 mg/dL (ref 5–40)

## 2019-07-29 LAB — HEPATIC FUNCTION PANEL
ALT: 56 IU/L — ABNORMAL HIGH (ref 0–32)
AST: 32 IU/L (ref 0–40)
Albumin: 4.3 g/dL (ref 3.8–4.9)
Alkaline Phosphatase: 67 IU/L (ref 39–117)
Bilirubin Total: 0.2 mg/dL (ref 0.0–1.2)
Bilirubin, Direct: 0.09 mg/dL (ref 0.00–0.40)
Total Protein: 6.6 g/dL (ref 6.0–8.5)

## 2019-08-10 NOTE — Progress Notes (Signed)
PATIENT: Kaylee Bryant DOB: 1965-11-22  REASON FOR VISIT: follow up HISTORY FROM: patient  HISTORY OF PRESENT ILLNESS: Today 08/11/19  HISTORY HISTORY: She was diagnosed with RRMS in 2007, 6 month post partum, she had numbness from waist done, diagnosis was confirmed by marked abnormal MRI brain, in March,2007. There were multiple T2/Flair periventricular lesions, corpus callosum involvement, mutiple ring enhancing lesions. Also had enhancing lesion in cervical and thoracic spines. She was treated at [facility] by Dr. Dellis Filbert, no csf study, but had extensive lab evaluations to rule out mimics. 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 She denies visual changes or other clear relapsing episodes.  I have reviewed report MRI brain by Dr. Brandon Melnick in 01/2007, multiple T2/FLAIR lesions in both hemisphere with corpus callosum involvement, there are slight increase in lesion load. MRI cervical, stable lesions in C6-7, and C2-3. Thoracic, multiple non enhancing lesions.  She tolerated Rebif well, no flu like illness, but complains of bilateral arm, and the lateral thigh lipoatrophy with repeat Rebif injection. There was no clear relapsed over the years, but she has slow worsening gait difficulty over the past 6 months.  MRI in Sept 2012, MRI of thoracic spine Multiple chronic plaques at C7, T6-7, T8-9, T10.  MRI of cervical spine ,Multiple chronic demyelinating plaques at C3, C4 and C7. MRI of brain Multiple round and ovioid, periventricular and subcortical chronic demyelinating plaques. 2 plaques noted in the pons. No abnormal lesions are seen on post contrast views.  Her JC-virus antibody was negative. After discuss, we started her on Tysarbri infusion since February of 2013. She  tolerated the infusion very well, but complains of excessive fatigue over the past few months, she still able to work full-time as a Marine scientist, she complains of difficulty dragging herself out of bed. Modafinil was not approved by insurance.She was put on Ritalin 10mg  tid, which helped her fatigue. feels much better on Tysabri.  JC-virus antibody negative (July 2014), Negative 0.17 in 11/11/2013,   UPDATE December 16 2013: She is doing very well, her gait has improved, tolerating [name], Ritalin 10 mg tid. for her fatigue.JC-virus antibody negative 11/11/2013 .  Most recent MRI of the brain 10/21/2013 with multiple supra tentorial and infra-tentorial chronic demyelinating plaques. No acute plaques. No change from MRI 05/22/2013. She returns for reevaluation  UPDATE April 4th 2016: She works full time as Therapist, sports, s he has more dizziness spells, especially when she has to stand up, she has occasional headaches, She complains of intermittent sharp pain at her right lower thoracic region, X-ray showed DJD, excerbated by walking, Orthopedic evaluation was normal, including bilateral hip,  She is on Tysarbri,taking baclofen 10mg  tid, works well for her lower extremity She has no incotninence, if she stopped,   UPDATE July 26th 2016:She is now complainng of right side low back pain, radiating to right hip, right hip muscle cramping, sometimes difficulty getting out of the bed, to put on her clothes, 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 She now noticed more fatigue, depression, also noticed mild worsening unsteady gait  I reviewed laboratory evaluations, ferritin 54, vitamin [id number], folic acid, iron level was normal, LDL 44, cholesterol 96, A1c 6.4, normal BMP, UA,  UPDATE Apr 08 2017: She has been receiving Tysarbri infusion, last one was in Jan 2019, she is overall doing very well, still has baseline mild gait abnormality, fatigue,  UPDATE July 10 2017: She came in today for EMG guided xeomin injection for frequent facial muscle spasm, sometimes to the point of affecting ability to form the word, open her mouth,  UPDATE October 16 2017: She reported MRI of lumbar recently by orthopedic surgeon Dr. Gladstone Lighter showed only mild degenerative changes, but increased upper back to lower back muscle spasm, despite higher dose of baclofen 80 mg daily, 20/40/20,  Over the years, she complains of progressive worsening gait abnormality.  She has been on Tysabri since February 2013.  She reported progressive lower extremity low back muscle spasm, urinary urgency, difficulty falling visual tract,  UPDATE Feb 11 2019: She complains of worsening bilateral lower extremity muscle spasm, gait abnormality, also worsening low back pain, she has taken 2 months off.  I personally reviewed CT lumbar spine myelogram in March 2019: Severe L4-5 facet arthrosis, with associated grade 1 anterior akinesis and mild bilateral neuroforaminal stenosis.  Advanced disc degeneration at L5-S1, with mild right, moderate to severe left foraminal stenosis  She denies bowel and bladder issues, does complains of daytime fatigue, especially after her baclofen, gabapentin, tizanidine for muscle spasticity  We personally reviewed MRI of brain with without contrast November 2020: Multiple round and ovoid periventricular  and subcortical, pontine chronic demyelinating plaque, MRI of cervical spine, chronic demyelinating plaque at C2, 3, 4, C6-7, also noted in the brainstem.  MRI of thoracic spine, chronic demyelinating plaque at C7, T6-7, T9, T10, T12-L1.  No evidence of contrast-enhancement  Laboratory evaluation showed CMP with elevated glucose 278, mild elevated ALT 59, CBC with hemoglobin of 13.7  Update 08/11/2019 JM: Was switched to [place] after last visit.  Initial infusion in 03/2019 with second dose in 04/2019.  Reports worsening bilateral lower extremity muscle spasming, back pain and gait impairment.  Was working with therapy without improvement.  She has not been working since 03/2019.  She attempted to decrease tizanidine and gabapentin use but due to worsening spasms, restart gabapentin 600 mg 3 times daily and continue baclofen 20 mg 3 times daily.  Current prescription for tizanidine 2 mg 3 times daily but will only take as needed in the evening time and may repeat if worsening spasms.  She experiences severe spasms every 3 days.  She also reports abnormal right eye movement which was noticed by her daughter 2 months ago and denies any worsening or associated symptoms such as blurred vision, diplopia or lack of vision.  Has been struggling with depression and questioning if this is related to medication change.        REVIEW OF SYSTEMS: Out of a complete 14 system review of symptoms, the patient complains only of the following symptoms, and all other reviewed systems are negative. See HPI  ALLERGIES: No Known Allergies  HOME MEDICATIONS: Outpatient Medications Prior to Visit  Medication Sig Dispense Refill  . acetaminophen (TYLENOL) 500 MG tablet Take 1,000 mg by mouth every 6 (six) hours as needed for mild pain.    Marland Kitchen aspirin EC 81 MG tablet Take 81 mg by mouth daily.    Marland Kitchen atorvastatin (LIPITOR) 80 MG tablet TAKE 1 TABLET (80 MG TOTAL) BY MOUTH DAILY AT 6 PM. 90 tablet 3  . baclofen (LIORESAL) 10  MG tablet Take 2 tablets (20 mg total) by mouth 3 (three) times daily. 540 tablet 4  . docusate sodium (COLACE) 100 MG capsule Take 100 mg by mouth 2 (two) times daily.    Marland Kitchen gabapentin (NEURONTIN) 300 MG capsule TAKE 2 CAPSULES (600 MG TOTAL) BY MOUTH 3 (THREE) TIMES DAILY. 540 capsule 3  . glipiZIDE (GLUCOTROL XL) 5 MG 24 hr tablet Take 10 mg by mouth daily. with food  3  . ibuprofen (ADVIL) 800 MG tablet Take 800 mg by mouth every 8 (eight) hours as needed.    Marland Kitchen lisinopril (ZESTRIL) 10 MG tablet Take 1 tablet (10 mg total) by mouth in the morning and at bedtime. 180 tablet 3  . metFORMIN (GLUCOPHAGE-XR) 500 MG 24 hr tablet Take 1,000 mg by mouth 2 (two) times daily.     . metoprolol tartrate (LOPRESSOR) 25 MG tablet TAKE 1 TABLET BY MOUTH TWICE A DAY 60 tablet 5  . ocrelizumab (OCREVUS) 300 MG/10ML injection Inject 300 mg into the vein as directed.    Marland Kitchen tiZANidine (ZANAFLEX) 2 MG tablet TAKE 1 TABLET (2 MG TOTAL) BY MOUTH 3 (THREE) TIMES DAILY. 270 tablet 2  . venlafaxine XR (EFFEXOR-XR) 75 MG 24 hr capsule TAKE 1 CAPSULE (75 MG TOTAL) BY MOUTH DAILY WITH BREAKFAST. 90 capsule 3   No facility-administered medications prior to visit.    PAST MEDICAL HISTORY: Past Medical History:  Diagnosis Date  . 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 Coronary artery disease involving native coronary artery with unstable angina pectoris ([place]) 04/05/2015  . DDD (degenerative disc disease), lumbar   . DM (diabetes mellitus), gestational 2006   insulin at the time  . Fatigue   . Heart attack ([place])   . IBS (irritable bowel syndrome)    with chronic constipation  . Multiple sclerosis ([place])   . Obesity   . 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 CARDIAC CATHETERIZATION N/A 04/05/2015   Procedure: Left Heart Cath and Coronary Angiography;  Surgeon: Troy Sine, MD;  Location: [facility];  Service:  Cardiovascular;  Laterality: N/A;  . CERVICAL CONE BIOPSY  2004  . [place], 2006  . CHOLECYSTECTOMY     1988  . COLPOSCOPY  2004  . CORONARY ARTERY BYPASS GRAFT N/A 04/07/2015   Procedure: CORONARY ARTERY BYPASS GRAFTING (CABG), OFF PUMP, TIMES ONE, USING LEFT INTERNAL MAMMARY ARTERY;  Surgeon: Rexene Alberts, MD;  Location: [facility];  Service: Open Heart Surgery;  Laterality: N/A;  LIMA to LAD  . TEE WITHOUT CARDIOVERSION N/A 04/07/2015   Procedure: TRANSESOPHAGEAL ECHOCARDIOGRAM (TEE);  Surgeon: Rexene Alberts, MD;  Location: [facility];  Service: Open Heart Surgery;  Laterality: N/A;    FAMILY HISTORY: Family History  Problem Relation Age of Onset  . Diabetes Mother   . Hypertension Mother   .  Hypertension Father   . Atrial fibrillation Father   . Diabetes Sister   . Breast cancer Maternal Aunt   . Arrhythmia Sister   . Heart attack Maternal Grandfather   . Stroke Neg Hx     SOCIAL HISTORY: Social History   Socioeconomic History  . Marital status: Married    Spouse name: Not on file  . Number of children: 4  . Years of education: college  . Highest education level: Not on file  Occupational History  . Occupation: Programmer, multimedia: [employer]  Tobacco Use  . Smoking status: Former Smoker    Packs/day: 1.50    Years: 15.00    Pack years: 22.50    Types: Cigarettes    Quit date: 02/03/2004    Years since quitting: 15.5  . Smokeless tobacco: Never Used  Substance and Sexual Activity  . Alcohol use: Yes    Alcohol/week: 1.0 standard drinks    Types: 1 Glasses of wine per week  . Drug use: No  . Sexual activity: Yes    Partners: Male    Birth control/protection: Surgical    Comment: vasectomy  Other Topics Concern  . Not on file  Social History Narrative   Patient lives at home with her husband and children. Patient works for the [employer] in [place]. College education.    Social Determinants of Health   Financial Resource Strain:    . Difficulty of Paying Living Expenses:   Food Insecurity:   . Worried About Charity fundraiser in the Last Year:   . Arboriculturist in the Last Year:   Transportation Needs:   . Film/video editor (Medical):   Marland Kitchen Lack of Transportation (Non-Medical):   Physical Activity:   . Days of Exercise per Week:   . Minutes of Exercise per Session:   Stress:   . Feeling of Stress :   Social Connections:   . Frequency of Communication with Friends and Family:   . Frequency of Social Gatherings with Friends and Family:   . Attends Religious Services:   . Active Member of Clubs or Organizations:   . Attends Archivist Meetings:   Marland Kitchen Marital Status:   Intimate Partner Violence:   . Fear of Current or Ex-Partner:   . Emotionally Abused:   Marland Kitchen Physically Abused:   . Sexually Abused:       PHYSICAL EXAM  Vitals:   08/11/19 0817  BP: (!) 153/78  Pulse: 63  Weight: 206 lb (93.4 kg)  Height: 5\' 3"  (1.6 m)   Body mass index is 36.49 kg/m.  Generalized: Well developed, in no acute distress   Neurological examination  Mentation: Alert oriented to time, place, history taking. Follows all commands speech and language fluent Cranial nerve II-XII: Pupils were equal round reactive to light. 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  Visual field were full on confrontational test. Facial sensation and strength were normal. Uvula tongue midline. Head turning and shoulder shrug  were normal and symmetric. Motor: The motor testing reveals 5 over 5 strength of all 4 extremities. Good symmetric motor tone is noted throughout.  Sensory: Sensory testing is intact to soft touch on all 4 extremities. No evidence of extinction is noted.  Coordination: Cerebellar testing reveals good finger-nose-finger and heel-to-shin bilaterally.  Gait and station: Gait demonstrates normal stride length with mild imbalance. Romberg is negative. No drift is seen.  Reflexes: Deep tendon  reflexes are  symmetric and normal bilaterally.   DIAGNOSTIC DATA (LABS, IMAGING, TESTING) - I reviewed patient records, labs, notes, testing and imaging myself where available.  Lab Results  Component Value Date   WBC 9.4 01/15/2019   HGB 13.7 01/15/2019   HCT 39.9 01/15/2019   MCV 85 01/15/2019   PLT 259 01/15/2019      Component Value Date/Time   NA 141 07/29/2019 0817   K 4.3 07/29/2019 0817   CL 101 07/29/2019 0817   CO2 26 07/29/2019 0817   GLUCOSE 293 (H) 07/29/2019 0817   GLUCOSE 145 (H) 04/11/2015 0537   BUN 10 07/29/2019 0817   CREATININE 0.71 07/29/2019 0817   CREATININE 0.65 06/02/2013 1640   CALCIUM 9.1 07/29/2019 0817   PROT 6.6 07/29/2019 0817   ALBUMIN 4.3 07/29/2019 0817   AST 32 07/29/2019 0817   ALT 56 (H) 07/29/2019 0817   ALKPHOS 67 07/29/2019 0817   BILITOT 0.2 07/29/2019 0817   GFRNONAA 98 07/29/2019 0817   GFRAA 112 07/29/2019 0817     ASSESSMENT AND PLAN 54 y.o. year old female  has a past medical history of Abnormal Pap smear (2004), Coronary artery disease involving native coronary artery with unstable angina pectoris ([place]) (04/05/2015), DDD (degenerative disc disease), lumbar, DM (diabetes mellitus), gestational (2006), Fatigue, Heart attack ([place]), IBS (irritable bowel syndrome), Multiple sclerosis ([place]), Obesity, and S/P CABG x 1 (04/07/2015). here with:    1.  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  As symptoms are not acute and denies worsening, no emergent or acute intervention needed.  Will discuss with Dr. Krista Blue in regards to further recommendations or need of imaging -Recent imaging 01/2019 including MRI brain, MRI cervical and MRI thoracic -Obtain CBC, recent CMP completed by PCP  2.  Restless leg syndrome -Continue gabapentin 300  mg, 2 tablets 3 times a day  3.  Bilateral lower extremity spasticity -Continue baclofen 10 mg, 2 tablets 3 times a day -Encouraged use of tizanidine 2 mg, 3 times a day dosing  4.  Depression -Continue Effexor ER 75 mg daily -Advised to continue to monitor as worsening depression may be situational but may benefit from increasing Effexor in the future if indicated   Follow-up with Dr. Krista Blue in 6 months or call earlier if needed    I spent 40 minutes of face-to-face and non-face-to-face time with patient.  This included previsit chart review, lab review, study review, order entry, electronic health record documentation, patient education.   Frann Rider, AGNP-BC  [facility] [address] [place], [place] [address]  Phone [phone] Fax [phone] Note: This document was prepared with digital dictation and possible smart phrase technology. Any transcriptional errors that result from this process are unintentional.

## 2019-08-11 ENCOUNTER — Encounter: Payer: Self-pay | Admitting: Adult Health

## 2019-08-11 ENCOUNTER — Ambulatory Visit: Payer: BC Managed Care – PPO | Admitting: Adult Health

## 2019-08-11 ENCOUNTER — Other Ambulatory Visit: Payer: Self-pay

## 2019-08-11 VITALS — BP 153/78 | HR 63 | Ht 63.0 in | Wt 206.0 lb

## 2019-08-11 DIAGNOSIS — Z5181 Encounter for therapeutic drug level monitoring: Secondary | ICD-10-CM | POA: Diagnosis not present

## 2019-08-11 DIAGNOSIS — M545 Low back pain, unspecified: Secondary | ICD-10-CM

## 2019-08-11 DIAGNOSIS — G35 Multiple sclerosis: Secondary | ICD-10-CM

## 2019-08-11 DIAGNOSIS — G2581 Restless legs syndrome: Secondary | ICD-10-CM

## 2019-08-11 NOTE — Patient Instructions (Signed)
Your Plan:  Recommend taking tizanidine 2mg  three times daily - try this way versus as needed basis Continue baclofen and gabapentin Ocrevous 600mg  infusion in August which will hopefully help with symptoms  Follow up with Dr. Krista Blue in 6 months or earlier if needed     Thank you for coming to see [place] at [facility]. I hope we have been able to provide you high quality care today.  You may receive a patient satisfaction survey over the next few weeks. We would appreciate your feedback and comments so that we may continue to improve ourselves and the health of our patients.

## 2019-08-12 LAB — CBC
Hematocrit: 44.5 % (ref 34.0–46.6)
Hemoglobin: 14.7 g/dL (ref 11.1–15.9)
MCH: 28.8 pg (ref 26.6–33.0)
MCHC: 33 g/dL (ref 31.5–35.7)
MCV: 87 fL (ref 79–97)
Platelets: 369 10*3/uL (ref 150–450)
RBC: 5.11 x10E6/uL (ref 3.77–5.28)
RDW: 13.3 % (ref 11.7–15.4)
WBC: 7.5 10*3/uL (ref 3.4–10.8)

## 2019-08-25 ENCOUNTER — Ambulatory Visit (INDEPENDENT_AMBULATORY_CARE_PROVIDER_SITE_OTHER): Payer: BC Managed Care – PPO | Admitting: Pharmacist

## 2019-08-25 ENCOUNTER — Other Ambulatory Visit: Payer: Self-pay

## 2019-08-25 VITALS — BP 112/74 | HR 72 | Ht 63.0 in | Wt 211.4 lb

## 2019-08-25 DIAGNOSIS — I10 Essential (primary) hypertension: Secondary | ICD-10-CM | POA: Diagnosis not present

## 2019-08-25 NOTE — Progress Notes (Signed)
HPI:  Kaylee Bryant is a 54 y.o. female patient of Dr Gwenlyn Found, with a PMH below who presents today for hypertension clinic follow up.  When last seen by [place] her BP was at goal during OV, but elevated most morning at home. Her lisinopril dose was increased from 10mg  daily to 10mg  twice daily. She continues to take metoprolol 25mg  twice daily as well.  Patient reporst less headaches, no dizziness, no swelling, or any problems with current therapy.  Past Medical History: ASCVD NSTEMI, CABG x 1   Multiple sclerosis Recently started Ocrevus q6 months  hyperlipidemia 05/2018- TC 109, TG 106, HDL 30, LDL 58 - on atorvastatin 80 mg  DM2 A1c 9.3 (09/2017) - on glipizide and metformin     Blood Pressure Goal:  130/80   Current Medications:  lisinopril 10 mg twice daily metoprolol tart 25 mg twice dcaily  Family Hx:  both parents with hypertension, father died at 69 mechanical fall, had CHF; mother DM, HTN, well controlled; sister with hypertension; 1 daughter with Crohn's disease, others fine   Social Hx: quit tobacco in 2005; no alcohol; coffee, 2 cans diet dr. pepper per day; lots of water due to tizanidine  Diet: mix of home and take-out at least once weekly; no added salt; trying hard to get veggies in diet; snakcs consist of popcorn  Exercise: no regular exercise  Home BP readings:  has new Omron meter - determined to be accurate within 34mmHg of manual reading 13 morning readings; average 134/76 (HR 55 to 80 bpm) 13 evening readings; average 131/69 (HR 57 to 80 bpm)  Intolerances: nkda  Labs:  07/29/2019: Na 141, K 4.3, Glu 293, BUN 10, Scr 0.71 12/2018:  Na 139, K 4.7, Glu 278, BUN 10, SCr 0.68  Wt Readings from Last 3 Encounters:  08/25/19 211 lb 6.4 oz (95.9 kg)  08/11/19 206 lb (93.4 kg)  07/07/19 208 lb 12.8 oz (94.7 kg)   BP Readings from Last 3 Encounters:  08/25/19 112/74  08/11/19 (!) 153/78  07/07/19 122/70   Pulse Readings from Last 3 Encounters:  08/25/19 72    08/11/19 63  07/07/19 (!) 58    Current Outpatient Medications  Medication Sig Dispense Refill  . acetaminophen (TYLENOL) 500 MG tablet Take 1,000 mg by mouth every 6 (six) hours as needed for mild pain.    Marland Kitchen aspirin EC 81 MG tablet Take 81 mg by mouth daily.    Marland Kitchen atorvastatin (LIPITOR) 80 MG tablet TAKE 1 TABLET (80 MG TOTAL) BY MOUTH DAILY AT 6 PM. 90 tablet 3  . baclofen (LIORESAL) 10 MG tablet Take 2 tablets (20 mg total) by mouth 3 (three) times daily. 540 tablet 4  . docusate sodium (COLACE) 100 MG capsule Take 100 mg by mouth 2 (two) times daily.    Marland Kitchen gabapentin (NEURONTIN) 300 MG capsule TAKE 2 CAPSULES (600 MG TOTAL) BY MOUTH 3 (THREE) TIMES DAILY. 540 capsule 3  . glipiZIDE (GLUCOTROL XL) 5 MG 24 hr tablet Take 10 mg by mouth daily. with food  3  . ibuprofen (ADVIL) 800 MG tablet Take 800 mg by mouth every 8 (eight) hours as needed.    Marland Kitchen lisinopril (ZESTRIL) 10 MG tablet Take 1 tablet (10 mg total) by mouth in the morning and at bedtime. 180 tablet 3  . metFORMIN (GLUCOPHAGE-XR) 500 MG 24 hr tablet Take 1,000 mg by mouth 2 (two) times daily.     . metoprolol tartrate (LOPRESSOR) 25 MG tablet  TAKE 1 TABLET BY MOUTH TWICE A DAY 60 tablet 5  . ocrelizumab (OCREVUS) 300 MG/10ML injection Inject 300 mg into the vein as directed.    Marland Kitchen tiZANidine (ZANAFLEX) 2 MG tablet TAKE 1 TABLET (2 MG TOTAL) BY MOUTH 3 (THREE) TIMES DAILY. 270 tablet 2  . venlafaxine XR (EFFEXOR-XR) 75 MG 24 hr capsule TAKE 1 CAPSULE (75 MG TOTAL) BY MOUTH DAILY WITH BREAKFAST. 90 capsule 3   No current facility-administered medications for this visit.    No Known Allergies  Past Medical History:  Diagnosis Date  . 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 Coronary artery disease involving native coronary artery with unstable angina pectoris ([place]) 04/05/2015  . DDD (degenerative disc disease), lumbar   . DM (diabetes mellitus), gestational 2006   insulin at the time  . Fatigue   . Heart attack ([place])    . IBS (irritable bowel syndrome)    with chronic constipation  . Multiple sclerosis ([place])   . Obesity   . 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  Essential hypertension Blood pressure well controlled during OV and better controled at home as well. Patient denies problems with current therapy and renal function remains stable. We discussed startegies to decrease dietary sodium. Will continue all medication without changes and follow up as needed.    Carloyn Lahue Rodriguez-Guzman PharmD, BCPS, [place] [address] 08/26/2019 4:51 PM

## 2019-08-25 NOTE — Patient Instructions (Signed)
Return for a  follow up appointment AS NEEDED  Check your blood pressure at home daily (if able) and keep record of the readings.  Take your BP meds as follows: *NO CHANGES*  Bring all of your meds, your BP cuff and your record of home blood pressures to your next appointment.  Exercise as you're able, try to walk approximately 30 minutes per day.  Keep salt intake to a minimum, especially watch canned and prepared boxed foods.  Eat more fresh fruits and vegetables and fewer canned items.  Avoid eating in fast food restaurants.    HOW TO TAKE YOUR BLOOD PRESSURE: . Rest 5 minutes before taking your blood pressure. .  Don't smoke or drink caffeinated beverages for at least 30 minutes before. . Take your blood pressure before (not after) you eat. . Sit comfortably with your back supported and both feet on the floor (don't cross your legs). . Elevate your arm to heart level on a table or a desk. . Use the proper sized cuff. It should fit smoothly and snugly around your bare upper arm. There should be enough room to slip a fingertip under the cuff. The bottom edge of the cuff should be 1 inch above the crease of the elbow. . Ideally, take 3 measurements at one sitting and record the average.

## 2019-08-26 NOTE — Assessment & Plan Note (Signed)
Blood pressure well controlled during OV and better controled at home as well. Patient denies problems with current therapy and renal function remains stable. We discussed startegies to decrease dietary sodium. Will continue all medication without changes and follow up as needed.

## 2019-08-30 ENCOUNTER — Other Ambulatory Visit: Payer: Self-pay | Admitting: Neurology

## 2019-10-20 DIAGNOSIS — M79675 Pain in left toe(s): Secondary | ICD-10-CM | POA: Diagnosis not present

## 2019-10-20 DIAGNOSIS — L03032 Cellulitis of left toe: Secondary | ICD-10-CM | POA: Diagnosis not present

## 2019-11-23 ENCOUNTER — Other Ambulatory Visit: Payer: Self-pay | Admitting: Neurology

## 2019-12-14 DIAGNOSIS — G35 Multiple sclerosis: Secondary | ICD-10-CM | POA: Diagnosis not present

## 2020-01-04 DIAGNOSIS — E119 Type 2 diabetes mellitus without complications: Secondary | ICD-10-CM | POA: Diagnosis not present

## 2020-01-04 DIAGNOSIS — H5203 Hypermetropia, bilateral: Secondary | ICD-10-CM | POA: Diagnosis not present

## 2020-01-04 DIAGNOSIS — H52203 Unspecified astigmatism, bilateral: Secondary | ICD-10-CM | POA: Diagnosis not present

## 2020-01-07 ENCOUNTER — Other Ambulatory Visit: Payer: Self-pay | Admitting: Cardiovascular Disease

## 2020-02-07 ENCOUNTER — Other Ambulatory Visit: Payer: Self-pay | Admitting: Nurse Practitioner

## 2020-02-07 DIAGNOSIS — U071 COVID-19: Secondary | ICD-10-CM

## 2020-02-07 NOTE — Progress Notes (Signed)
I connected by phone with Kaylee Bryant on 02/07/2020 at 10:07 AM to discuss the potential use of a treatment for mild to moderate COVID-19 viral infection in non-hospitalized patients.  This patient is a 54 y.o. female that meets the FDA criteria for Emergency Use Authorization of bamlanivimab/etesevimab, casirivimab\imdevimab, or sotrovimab  Has a (+) direct SARS-CoV-2 viral test result  Has mild or moderate COVID-19   Is ? 54 years of age and weighs ? 40 kg  Is NOT hospitalized due to COVID-19  Is NOT requiring oxygen therapy or requiring an increase in baseline oxygen flow rate due to COVID-19  Is within 10 days of symptom onset  Has at least one of the high risk factor(s) for progression to severe COVID-19 and/or hospitalization as defined in EUA.  Specific high risk criteria : BMI > 25, Diabetes and Cardiovascular disease or hypertension   I have spoken and communicated the following to the patient or parent/caregiver:  1. FDA has authorized the emergency use of bamlanivimab/etesevimab, casirivimab\imdevimab, or sotrovimab for the treatment of mild to moderate COVID-19 in adults and pediatric patients with positive results of direct SARS-CoV-2 viral testing who are 52 years of age and older weighing at least 40 kg, and who are at high risk for progressing to severe COVID-19 and/or hospitalization.  2. The significant known and potential risks and benefits of bamlanivimab/etesevimab, casirivimab\imdevimab, or sotrovimab, and the extent to which such potential risks and benefits are unknown.  3. Information on available alternative treatments and the risks and benefits of those alternatives, including clinical trials.  4. Patients treated with bamlanivimab/etesevimab, casirivimab\imdevimab, or sotrovimab should continue to self-isolate and use infection control measures (e.g., wear mask, isolate, social distance, avoid sharing personal items, clean and disinfect "high touch" surfaces,  and frequent handwashing) according to CDC guidelines.   5. The patient or parent/caregiver has the option to accept or refuse bamlanivimab/etesevimab, casirivimab\imdevimab, or sotrovimab.  After reviewing this information with the patient, the patient has agreed to receive one of the available covid 19 monoclonal antibodies and will be provided an appropriate fact sheet prior to infusion.Beckey Rutter, [place], AGNP-C [phone] ([place])

## 2020-02-08 ENCOUNTER — Ambulatory Visit (HOSPITAL_COMMUNITY)
Admission: RE | Admit: 2020-02-08 | Discharge: 2020-02-08 | Disposition: A | Discharge status: Home / Self Care | Payer: Managed Care, Other (non HMO) | Source: Ambulatory Visit | Attending: Pulmonary Disease | Admitting: Pulmonary Disease

## 2020-02-08 DIAGNOSIS — E119 Type 2 diabetes mellitus without complications: Secondary | ICD-10-CM | POA: Insufficient documentation

## 2020-02-08 DIAGNOSIS — I10 Essential (primary) hypertension: Secondary | ICD-10-CM | POA: Insufficient documentation

## 2020-02-08 DIAGNOSIS — U071 COVID-19: Secondary | ICD-10-CM | POA: Diagnosis not present

## 2020-02-08 DIAGNOSIS — Z6825 Body mass index (BMI) 25.0-25.9, adult: Secondary | ICD-10-CM | POA: Diagnosis not present

## 2020-02-08 MED ORDER — SOTROVIMAB 500 MG/8ML IV SOLN
500.0000 mg | Freq: Once | INTRAVENOUS | Status: AC
  Administered 2020-02-08: 500 mg via INTRAVENOUS

## 2020-02-08 MED ORDER — ALBUTEROL SULFATE HFA 108 (90 BASE) MCG/ACT IN AERS: 2.0000 | INHALATION_SPRAY | Freq: Once | RESPIRATORY_TRACT | Status: DC | PRN

## 2020-02-08 MED ORDER — EPINEPHRINE 0.3 MG/0.3ML IJ SOAJ: 0.3000 mg | Freq: Once | INTRAMUSCULAR | Status: DC | PRN

## 2020-02-08 MED ORDER — METHYLPREDNISOLONE SODIUM SUCC 125 MG IJ SOLR: 125.0000 mg | Freq: Once | INTRAMUSCULAR | Status: DC | PRN

## 2020-02-08 MED ORDER — DIPHENHYDRAMINE HCL 50 MG/ML IJ SOLN: 50.0000 mg | Freq: Once | INTRAMUSCULAR | Status: DC | PRN

## 2020-02-08 MED ORDER — SODIUM CHLORIDE 0.9 % IV SOLN: INTRAVENOUS | Status: DC | PRN

## 2020-02-08 MED ORDER — FAMOTIDINE IN NACL 20-0.9 MG/50ML-% IV SOLN: 20.0000 mg | Freq: Once | INTRAVENOUS | Status: DC | PRN

## 2020-02-08 NOTE — Progress Notes (Signed)
  Diagnosis: COVID-19  Physician:Dr Joya Gaskins  Procedure:sotrovimab  Complications: No immediate complications noted.  Discharge: Discharged home   Kaylee Bryant, [place] 02/08/2020

## 2020-02-08 NOTE — Discharge Instructions (Signed)

## 2020-02-15 ENCOUNTER — Ambulatory Visit: Payer: BC Managed Care – PPO | Admitting: Neurology

## 2020-02-15 NOTE — Progress Notes (Signed)
I have reviewed and agreed above plan. 

## 2020-03-05 ENCOUNTER — Other Ambulatory Visit: Payer: Self-pay | Admitting: Neurology

## 2020-03-11 ENCOUNTER — Other Ambulatory Visit: Payer: Self-pay | Admitting: Neurology

## 2020-04-11 ENCOUNTER — Ambulatory Visit: Payer: BC Managed Care – PPO | Admitting: Neurology

## 2020-05-02 ENCOUNTER — Encounter: Payer: Self-pay | Admitting: Neurology

## 2020-05-02 ENCOUNTER — Ambulatory Visit (INDEPENDENT_AMBULATORY_CARE_PROVIDER_SITE_OTHER): Payer: Managed Care, Other (non HMO) | Admitting: Neurology

## 2020-05-02 VITALS — BP 148/71 | HR 63 | Ht 63.0 in | Wt 194.5 lb

## 2020-05-02 DIAGNOSIS — G35 Multiple sclerosis: Secondary | ICD-10-CM | POA: Diagnosis not present

## 2020-05-02 DIAGNOSIS — M5416 Radiculopathy, lumbar region: Secondary | ICD-10-CM | POA: Insufficient documentation

## 2020-05-02 MED ORDER — PREGABALIN 200 MG PO CAPS: 200.0000 mg | ORAL_CAPSULE | Freq: Three times a day (TID) | ORAL | 5 refills | Status: DC

## 2020-05-02 MED ORDER — TIZANIDINE HCL 2 MG PO TABS
2.0000 mg | ORAL_TABLET | Freq: Three times a day (TID) | ORAL | 3 refills | Status: DC
Start: 2020-05-02 — End: 2021-05-08

## 2020-05-02 NOTE — Progress Notes (Addendum)
HISTORY OF PRESENT ILLNESS: She was diagnosed with RRMS in 2007, 6 month post partum, she had numbness from waist done, diagnosis was confirmed by marked abnormal MRI brain, in March,2007. There were multiple T2/Flair periventricular lesions, corpus callosum involvement, mutiple ring enhancing lesions. Also had enhancing lesion in cervical and thoracic spines. She was treated at [facility] by Dr. Dellis Filbert, no csf study, but had extensive lab evaluations to rule out mimics. 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 She denies visual changes or other clear relapsing episodes.   MRI brain  in 01/2007, multiple T2/FLAIR lesions in both hemisphere with corpus callosum involvement, there are slight increase in lesion load. MRI cervical, stable lesions in C6-7, and C2-3. Thoracic, multiple non enhancing lesions.   She tolerated Rebif well, no flu like illness, but complains of bilateral arm, and the lateral thigh lipoatrophy with repeat Rebif injection. There was no clear relapsed over the years, but she has slow worsening gait difficulty over the past 6 months.  MRI in Sept 2012, MRI of thoracic spine Multiple chronic plaques at C7, T6-7, T8-9, T10.  MRI of cervical spine ,Multiple chronic demyelinating plaques at C3, C4 and C7. MRI of brain Multiple round and ovioid, periventricular and subcortical chronic demyelinating plaques. 2 plaques noted in the pons. No abnormal lesions are seen on post contrast views.  Her JC-virus antibody was negative. After discuss, we started her on Tysarbri infusion since February of 2013. She tolerated the infusion very well, but complains of excessive fatigue over the past few months, she still able to work full-time as a Marine scientist, she complains of  difficulty dragging herself out of bed. Modafinil was not approved by insurance.She was put on Ritalin 10mg  tid, which helped her fatigue. feels much better on Tysabri.  JC-virus antibody negative (July 2014), Negative 0.17 in 11/11/2013,   UPDATE December 16 2013: She is doing very well, her gait has improved, tolerating [name], Ritalin 10 mg tid. for her fatigue.JC-virus antibody negative 11/11/2013 .  Most recent MRI of the brain 10/21/2013 with multiple supra tentorial and infra-tentorial chronic demyelinating plaques. No acute plaques. No change from MRI 05/22/2013. She returns for reevaluation  UPDATE April 4th 2016: She works full time as Therapist, sports, s he has more dizziness spells, especially when she has to stand up, she has occasional headaches, She complains of intermittent sharp pain at her right lower thoracic region, X-ray showed DJD, excerbated by walking, Orthopedic evaluation was normal, including bilateral hip,  She is on Tysarbri,taking baclofen 10mg  tid, works well for her lower extremity She has no incotninence, if she stopped,   UPDATE July 26th 2016:She is now complainng of right side low back pain, radiating to right hip, right hip muscle cramping, sometimes difficulty getting out of the bed, to put on her clothes, 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 She now noticed more fatigue, depression, also noticed mild worsening unsteady gait  Laboratory evaluations, ferritin 54, vitamin 123456, folic acid, iron level was normal, LDL 44, cholesterol 96, A1c 6.4, normal BMP, UA,  UPDATE Apr 08 2017: She has been receiving Tysarbri infusion, last one was in Jan 2019, she is overall doing very well, still has baseline mild gait abnormality, fatigue,  UPDATE July 10 2017: She came in today for EMG guided xeomin injection for frequent facial muscle spasm, sometimes to the point of affecting ability to form the word, open her mouth,  UPDATE October 16 2017: She reported MRI of lumbar recently by orthopedic surgeon Dr. Gladstone Lighter showed only mild degenerative changes, but increased upper back to lower back muscle spasm, despite higher dose of baclofen 80 mg daily, 20/40/20,  Over the years, she complains of progressive worsening gait abnormality.  She has been on Tysabri since February 2013.  She reported progressive lower extremity low back muscle spasm, urinary urgency, difficulty falling visual tract,  UPDATE Feb 11 2019: She complains of worsening bilateral lower extremity muscle spasm, gait abnormality, also worsening low back pain, she has taken 2 months off.  I personally reviewed CT lumbar spine myelogram in March 2019: Severe L4-5 facet arthrosis, with associated grade 1 anterior akinesis and mild bilateral neuroforaminal stenosis.  Advanced disc degeneration at L5-S1, with mild right, moderate to severe left foraminal stenosis  She denies bowel and bladder issues, does complains of daytime fatigue, especially after her baclofen, gabapentin, tizanidine for muscle spasticity  We personally reviewed MRI of brain with without contrast November 2020: Multiple round and ovoid periventricular and subcortical, pontine chronic demyelinating plaque, MRI of cervical spine, chronic demyelinating plaque at C2, 3, 4, C6-7, also noted in the brainstem.   MRI of thoracic spine, chronic demyelinating plaque at C7, T6-7, T9, T10, T12-L1.  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, with short staffed, she has to work extra hours, does complains of fatigue, on polypharmacy treatment, including Effexor XR 75 mg daily, baclofen 10 mg 2 tablets 3 times a day, tizanidine 2 mg 3 times a day, also on gabapentin 600 mg 3 times a day  Recently she has intermittent sharp radiating pain at right face from the right forehead to break in the eye corner, to below cheek area, gabapentin provide limited help,  Last MRI of the brain was in November 2020, personally reviewed the films, MRI of the brain multiple round and oval periventricular, subcortical, pontine chronic demyelinating plaques, no acute abnormality, no change from MRI compared with January 2019, MRI of cervical and thoracic spine showed chronic demyelinating plaque at C2, C3, 4, and C6- C7, T6-7, T9, T10, T12 and L1, no enhancement     REVIEW OF SYSTEMS: Out of a complete 14 system review of symptoms, the patient complains only of the following symptoms, and all other reviewed systems are negative. See HPI  ALLERGIES: No Known Allergies  HOME MEDICATIONS: Outpatient Medications Prior to Visit  Medication Sig Dispense Refill  . acetaminophen (TYLENOL) 500 MG tablet Take 1,000 mg by mouth every 6 (six) hours as needed for mild pain.    Marland Kitchen aspirin EC 81 MG tablet Take 81 mg by mouth daily.    Marland Kitchen atorvastatin (LIPITOR) 80 MG tablet TAKE 1 TABLET (80 MG TOTAL) BY MOUTH DAILY AT 6 PM. 90 tablet 3  . baclofen (LIORESAL) 10 MG tablet TAKE  2 TABLETS (20 MG TOTAL) BY MOUTH 3 (THREE) TIMES DAILY. 540 tablet 3  . gabapentin (NEURONTIN) 300 MG capsule TAKE 2 CAPSULES (600 MG TOTAL) BY MOUTH 3 (THREE) TIMES DAILY. 540 capsule 3  . glipiZIDE (GLUCOTROL XL) 5 MG 24 hr tablet Take 10 mg by mouth daily. with food  3  . ibuprofen (ADVIL) 800 MG tablet Take 800 mg by mouth every 8 (eight) hours as needed.    Marland Kitchen lisinopril (ZESTRIL) 10 MG tablet Take 1 tablet (10 mg total) by mouth in the morning and at bedtime. 180 tablet 3  . metFORMIN (GLUCOPHAGE-XR) 500 MG 24 hr tablet Take 1,000 mg by mouth 2 (two) times daily.     . metoprolol tartrate (LOPRESSOR) 25 MG tablet TAKE 1 TABLET BY MOUTH TWICE A DAY 60 tablet 5  . ocrelizumab (OCREVUS) 300 MG/10ML injection Inject 300 mg into the vein as directed.    Marland Kitchen tiZANidine (ZANAFLEX) 2 MG tablet TAKE 1 TABLET (2 MG TOTAL) BY MOUTH 3 (THREE) TIMES DAILY. 270 tablet 3  . venlafaxine XR (EFFEXOR-XR) 75 MG 24 hr capsule TAKE 1 CAPSULE (75 MG TOTAL) BY MOUTH DAILY WITH BREAKFAST. 90 capsule 3  . docusate sodium (COLACE) 100 MG capsule Take 100 mg by mouth 2 (two) times daily.     No facility-administered medications prior to visit.    PAST MEDICAL HISTORY: Past Medical History:  Diagnosis Date  . 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 Coronary artery disease involving native coronary artery with unstable angina pectoris ([place]) 04/05/2015  . DDD (degenerative disc disease), lumbar   . DM (diabetes mellitus), gestational 2006   insulin at the time  . Fatigue   . Heart attack ([place])   . IBS (irritable bowel syndrome)    with chronic constipation  . Multiple sclerosis ([place])   . Obesity   . 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 CARDIAC CATHETERIZATION N/A 04/05/2015   Procedure: Left Heart Cath and Coronary Angiography;  Surgeon: Troy Sine, MD;  Location: [facility];  Service: Cardiovascular;   Laterality: N/A;  . CERVICAL CONE BIOPSY  2004  . [place], 2006  . CHOLECYSTECTOMY     1988  . COLPOSCOPY  2004  . CORONARY ARTERY BYPASS GRAFT N/A 04/07/2015   Procedure: CORONARY ARTERY BYPASS GRAFTING (CABG), OFF PUMP, TIMES ONE, USING LEFT INTERNAL MAMMARY ARTERY;  Surgeon: Rexene Alberts, MD;  Location: [facility];  Service: Open Heart Surgery;  Laterality: N/A;  LIMA to LAD  . TEE WITHOUT CARDIOVERSION N/A 04/07/2015   Procedure: TRANSESOPHAGEAL ECHOCARDIOGRAM (TEE);  Surgeon: Rexene Alberts, MD;  Location: [facility];  Service: Open Heart Surgery;  Laterality: N/A;    FAMILY HISTORY: Family History  Problem Relation Age of Onset  . Diabetes Mother   . Hypertension Mother   . Hypertension Father   .  Atrial fibrillation Father   . Diabetes Sister   . Breast cancer Maternal Aunt   . Arrhythmia Sister   . Heart attack Maternal Grandfather   . Stroke Neg Hx     SOCIAL HISTORY: Social History   Socioeconomic History  . Marital status: Married    Spouse name: Not on file  . Number of children: 4  . Years of education: college  . Highest education level: Not on file  Occupational History  . Occupation: Programmer, multimedia: [employer]  Tobacco Use  . Smoking status: Former Smoker    Packs/day: 1.50    Years: 15.00    Pack years: 22.50    Types: Cigarettes    Quit date: 02/03/2004    Years since quitting: 16.2  . Smokeless tobacco: Never Used  Substance and Sexual Activity  . Alcohol use: Yes    Alcohol/week: 1.0 standard drink    Types: 1 Glasses of wine per week  . Drug use: No  . Sexual activity: Yes    Partners: Male    Birth control/protection: Surgical    Comment: vasectomy  Other Topics Concern  . Not on file  Social History Narrative   Patient lives at home with her husband and children. Patient works for the [employer] in [place]. College education.    Social Determinants of Health   Financial Resource Strain: Not on file  Food  Insecurity: Not on file  Transportation Needs: Not on file  Physical Activity: Not on file  Stress: Not on file  Social Connections: Not on file  Intimate Partner Violence: Not on file      PHYSICAL EXAM  Vitals:   05/02/20 0730  BP: (!) 148/71  Pulse: 63  Weight: 194 lb 8 oz (88.2 kg)  Height: 5\' 3"  (1.6 m)   Body mass index is 34.45 kg/m.   PHYSICAL EXAMNIATION:  Gen: NAD, conversant, well nourised, well groomed                     Cardiovascular: Regular rate rhythm, no peripheral edema, warm, nontender. Eyes: Conjunctivae clear without exudates or hemorrhage Neck: Supple, no carotid bruits. Pulmonary: Clear to auscultation bilaterally   NEUROLOGICAL EXAM:  MENTAL STATUS: Speech/Cognition: Awake, alert, normal speech, oriented to history taking and casual conversation.  CRANIAL NERVES: CN II: Visual fields are full to confrontation.  Pupils are round equal and briskly reactive to light. CN III, IV, VI: extraocular movement are normal. No ptosis. CN V: Facial sensation is intact to light touch. CN VII: Face is symmetric with normal eye closure and smile. CN VIII: Hearing is normal to casual conversation CN IX, X: Palate elevates symmetrically. Phonation is normal. CN XI: Head turning and shoulder shrug are intact CN XII: Tongue is midline with normal movements and no atrophy.  MOTOR: Mild bilateral lower extremity spasticity, mild right ankle dorsiflexion weakness  REFLEXES: Reflexes are 2  and symmetric at the biceps, triceps, knees and ankles. Plantar responses are flexor.  SENSORY: Intact to light touch, pinprick, positional and vibratory sensation at fingers and toes.  COORDINATION: There is no trunk or limb ataxia.    GAIT/STANCE: She can get up from seated position arm crossed, stiff, unsteady, dragging right leg more,  DIAGNOSTIC DATA (LABS, IMAGING, TESTING) - I reviewed patient records, labs, notes, testing and imaging myself where  available.  Lab Results  Component Value Date   WBC 7.5 08/11/2019   HGB 14.7 08/11/2019  HCT 44.5 08/11/2019   MCV 87 08/11/2019   PLT 369 08/11/2019      Component Value Date/Time   NA 141 07/29/2019 0817   K 4.3 07/29/2019 0817   CL 101 07/29/2019 0817   CO2 26 07/29/2019 0817   GLUCOSE 293 (H) 07/29/2019 0817   GLUCOSE 145 (H) 04/11/2015 0537   BUN 10 07/29/2019 0817   CREATININE 0.71 07/29/2019 0817   CREATININE 0.65 06/02/2013 1640   CALCIUM 9.1 07/29/2019 0817   PROT 6.6 07/29/2019 0817   ALBUMIN 4.3 07/29/2019 0817   AST 32 07/29/2019 0817   ALT 56 (H) 07/29/2019 0817   ALKPHOS 67 07/29/2019 0817   BILITOT 0.2 07/29/2019 0817   GFRNONAA 98 07/29/2019 0817   GFRAA 112 07/29/2019 0817     ASSESSMENT AND PLAN 55 y.o. year old female  Relapsing remitting multiple sclerosis -Tysabri infusions since February 2013 -Transitioned to [place] with initial injection 03/2019 Tolerating it well, Laboratory evaluations MRI of brain with without contrast  Bilateral lower extremity spasticity -Continue baclofen 10 mg, 2 tablets 3 times a day -Encouraged use of tizanidine 2 mg, 3 times a day dosing  Depression -Continue Effexor ER 75 mg daily  Worsening low back pain, radiating pain to right lower extremity Suggestive of lumbar radiculopathy, proceed with MRI of lumbar,  Atypical right facial pain, Failed to respond to gabapentin 300 mg 2 tablets 3 times a day, Switch to Lyrica 200 mg 3 times a day   Marcial Pacas, M.D. Ph.D.  [facility] [place], [place] [address] Phone: [phone] Fax:      [phone]  Addendum: Neurosurgical evaluation by Dr. Christella Noa June 06, 2020, severe L4-5 stenosis secondary to listhesis, facet arthropathy, synovial cyst, planning on decompression surgery

## 2020-05-03 ENCOUNTER — Telehealth: Payer: Self-pay | Admitting: Neurology

## 2020-05-03 NOTE — Telephone Encounter (Signed)
[employer] Josem Kaufmann: [phone] & [phone] (exp. 05/03/20 to 06/01/20)  Christella Scheuermann pending faxed notes.

## 2020-05-05 LAB — COMPREHENSIVE METABOLIC PANEL
ALT: 51 IU/L — ABNORMAL HIGH (ref 0–32)
AST: 33 IU/L (ref 0–40)
Albumin/Globulin Ratio: 2 (ref 1.2–2.2)
Albumin: 4.7 g/dL (ref 3.8–4.9)
Alkaline Phosphatase: 81 IU/L (ref 44–121)
BUN/Creatinine Ratio: 21 (ref 9–23)
BUN: 16 mg/dL (ref 6–24)
Bilirubin Total: 0.3 mg/dL (ref 0.0–1.2)
CO2: 22 mmol/L (ref 20–29)
Calcium: 9.5 mg/dL (ref 8.7–10.2)
Chloride: 101 mmol/L (ref 96–106)
Creatinine, Ser: 0.78 mg/dL (ref 0.57–1.00)
GFR calc Af Amer: 100 mL/min/{1.73_m2} (ref >59)
GFR calc non Af Amer: 87 mL/min/{1.73_m2} (ref >59)
Globulin, Total: 2.3 g/dL (ref 1.5–4.5)
Glucose: 254 mg/dL — ABNORMAL HIGH (ref 65–99)
Potassium: 4.6 mmol/L (ref 3.5–5.2)
Sodium: 140 mmol/L (ref 134–144)
Total Protein: 7 g/dL (ref 6.0–8.5)

## 2020-05-05 LAB — CBC WITH DIFFERENTIAL
Basophils Absolute: 0.1 10*3/uL (ref 0.0–0.2)
Basos: 1 %
EOS (ABSOLUTE): 0.3 10*3/uL (ref 0.0–0.4)
Eos: 4 %
Hematocrit: 44.1 % (ref 34.0–46.6)
Hemoglobin: 14.7 g/dL (ref 11.1–15.9)
Immature Grans (Abs): 0 10*3/uL (ref 0.0–0.1)
Immature Granulocytes: 0 %
Lymphocytes Absolute: 2 10*3/uL (ref 0.7–3.1)
Lymphs: 30 %
MCH: 29.6 pg (ref 26.6–33.0)
MCHC: 33.3 g/dL (ref 31.5–35.7)
MCV: 89 fL (ref 79–97)
Monocytes Absolute: 0.5 10*3/uL (ref 0.1–0.9)
Monocytes: 7 %
Neutrophils Absolute: 4 10*3/uL (ref 1.4–7.0)
Neutrophils: 58 %
RBC: 4.97 x10E6/uL (ref 3.77–5.28)
RDW: 12.3 % (ref 11.7–15.4)
WBC: 6.8 10*3/uL (ref 3.4–10.8)

## 2020-05-05 LAB — IGG, IGA, IGM
IgA/Immunoglobulin A, Serum: 218 mg/dL (ref 87–352)
IgG (Immunoglobin G), Serum: 797 mg/dL (ref 586–1602)
IgM (Immunoglobulin M), Srm: 11 mg/dL — ABNORMAL LOW (ref 26–217)

## 2020-05-05 LAB — TSH: TSH: 1.03 u[IU]/mL (ref 0.450–4.500)

## 2020-05-05 LAB — CD19 AND CD20, FLOW CYTOMETRY

## 2020-05-05 LAB — VITAMIN D 25 HYDROXY (VIT D DEFICIENCY, FRACTURES): Vit D, 25-Hydroxy: 39.6 ng/mL (ref 30.0–100.0)

## 2020-05-09 NOTE — Telephone Encounter (Signed)
Dr. Krista Blue- they are requesting Peer to Peer for MRI's. Raquel Sarna provided phone number below

## 2020-05-09 NOTE — Telephone Encounter (Signed)
Yes, peer to peer review

## 2020-05-09 NOTE — Telephone Encounter (Signed)
Dr. Krista Blue- I think they are requesting for you to call to do the peer to peer review for MRI's

## 2020-05-09 NOTE — Telephone Encounter (Signed)
[employer] did not approve the MRI's. I called [employer] they stated the reason is needing a history of nerve system exam and dates of the duration of treatment. Need the treatment to be in 6 weeks with a follow up with either a office visit or phone call. I did fax clinical notes and they did receive those.  There is an option to a do a peer to peer. The phone number is [phone] option 4. The case number is [id number]. They said there is no timeframe of the peer to peer to be done by.

## 2020-05-10 NOTE — Telephone Encounter (Signed)
Peer to peer scheduled for Feb 16th 1115am, leave my cell and office phone as backup.

## 2020-05-11 NOTE — Telephone Encounter (Signed)
MRI of brain and lumbar approved   [id number]

## 2020-05-12 NOTE — Telephone Encounter (Signed)
Noted, patient is scheduled at [facility] for 05/24/20.

## 2020-05-24 ENCOUNTER — Ambulatory Visit (INDEPENDENT_AMBULATORY_CARE_PROVIDER_SITE_OTHER): Payer: Managed Care, Other (non HMO)

## 2020-05-24 DIAGNOSIS — M5416 Radiculopathy, lumbar region: Secondary | ICD-10-CM

## 2020-05-24 DIAGNOSIS — G35 Multiple sclerosis: Secondary | ICD-10-CM | POA: Diagnosis not present

## 2020-05-24 MED ORDER — GADOBENATE DIMEGLUMINE 529 MG/ML IV SOLN
15.0000 mL | Freq: Once | INTRAVENOUS | Status: AC | PRN
  Administered 2020-05-24: 15 mL via INTRAVENOUS

## 2020-05-26 ENCOUNTER — Telehealth: Payer: Self-pay | Admitting: Neurology

## 2020-05-26 DIAGNOSIS — M48061 Spinal stenosis, lumbar region without neurogenic claudication: Secondary | ICD-10-CM

## 2020-05-26 DIAGNOSIS — R269 Unspecified abnormalities of gait and mobility: Secondary | ICD-10-CM

## 2020-05-26 NOTE — Telephone Encounter (Signed)
   IMPRESSION:   MRI lumbar spine without contrast demonstrating: - At L4-5: pseudodisc bulging, facet hypertrophy, right posterior lateral bone spurring resulting in severe spinal stenosis and mild bilateral foraminal stenosis.  This is a new finding compared to 2019. - At L5-S1: Disc bulging and facet hypertrophy with moderate right and severe left foraminal stenosis.  Please call patient, MRI of lumbar spine showed significant spinal stenosis at the C4-5 level, this will explain her significant low back pain  Based on imaging study alone, I would recommend neurosurgical evaluation for decompression surgery, if she wants to to be reevaluated and to review MRI feel before refer to neurosurgeon, we can put in the order for EMG nerve conduction study and we will review films at that visit as well

## 2020-05-26 NOTE — Addendum Note (Signed)
Addended by: Desmond Lope on: 05/26/2020 04:46 PM   Modules accepted: Orders

## 2020-05-26 NOTE — Telephone Encounter (Signed)
I spoke to the patient and provided her with the MRI lumbar results. She is agreeable to the NCV/EMG and the neurosurgery referral. Both orders have been placed in Epic. She is aware to expect a call for scheduling.

## 2020-06-06 DIAGNOSIS — M48062 Spinal stenosis, lumbar region with neurogenic claudication: Secondary | ICD-10-CM | POA: Diagnosis not present

## 2020-06-06 DIAGNOSIS — Z6835 Body mass index (BMI) 35.0-35.9, adult: Secondary | ICD-10-CM | POA: Diagnosis not present

## 2020-06-06 DIAGNOSIS — R030 Elevated blood-pressure reading, without diagnosis of hypertension: Secondary | ICD-10-CM | POA: Diagnosis not present

## 2020-06-06 DIAGNOSIS — M4316 Spondylolisthesis, lumbar region: Secondary | ICD-10-CM | POA: Diagnosis not present

## 2020-06-20 ENCOUNTER — Ambulatory Visit: Payer: Managed Care, Other (non HMO) | Admitting: Neurology

## 2020-07-04 ENCOUNTER — Encounter: Payer: Managed Care, Other (non HMO) | Admitting: Neurology

## 2020-07-06 ENCOUNTER — Encounter: Payer: Managed Care, Other (non HMO) | Admitting: Neurology

## 2020-07-14 ENCOUNTER — Other Ambulatory Visit: Payer: Self-pay

## 2020-07-14 ENCOUNTER — Other Ambulatory Visit: Payer: Self-pay | Admitting: Cardiovascular Disease

## 2020-07-14 MED ORDER — ATORVASTATIN CALCIUM 80 MG PO TABS
ORAL_TABLET | ORAL | 1 refills | Status: DC
Start: 2020-07-14 — End: 2020-08-09

## 2020-07-14 MED ORDER — METOPROLOL TARTRATE 25 MG PO TABS
25.0000 mg | ORAL_TABLET | Freq: Two times a day (BID) | ORAL | 1 refills | Status: DC
Start: 2020-07-14 — End: 2020-08-09

## 2020-07-14 MED ORDER — LISINOPRIL 10 MG PO TABS
10.0000 mg | ORAL_TABLET | Freq: Two times a day (BID) | ORAL | 1 refills | Status: DC
Start: 2020-07-14 — End: 2020-08-09

## 2020-07-18 ENCOUNTER — Encounter: Payer: Managed Care, Other (non HMO) | Admitting: Neurology

## 2020-08-03 ENCOUNTER — Ambulatory Visit (INDEPENDENT_AMBULATORY_CARE_PROVIDER_SITE_OTHER): Payer: Managed Care, Other (non HMO) | Admitting: Neurology

## 2020-08-03 ENCOUNTER — Telehealth: Payer: Self-pay | Admitting: *Deleted

## 2020-08-03 DIAGNOSIS — M48061 Spinal stenosis, lumbar region without neurogenic claudication: Secondary | ICD-10-CM

## 2020-08-03 DIAGNOSIS — R269 Unspecified abnormalities of gait and mobility: Secondary | ICD-10-CM | POA: Diagnosis not present

## 2020-08-03 DIAGNOSIS — M5416 Radiculopathy, lumbar region: Secondary | ICD-10-CM

## 2020-08-03 DIAGNOSIS — R252 Cramp and spasm: Secondary | ICD-10-CM | POA: Diagnosis not present

## 2020-08-03 DIAGNOSIS — G35 Multiple sclerosis: Secondary | ICD-10-CM | POA: Diagnosis not present

## 2020-08-03 MED ORDER — GABAPENTIN 300 MG PO CAPS: 300.0000 mg | ORAL_CAPSULE | Freq: Three times a day (TID) | ORAL | 11 refills | Status: DC

## 2020-08-03 MED ORDER — GABAPENTIN 300 MG PO CAPS: 600.0000 mg | ORAL_CAPSULE | Freq: Three times a day (TID) | ORAL | 11 refills | Status: DC

## 2020-08-03 NOTE — Telephone Encounter (Signed)
Pt parking placard ready for p/u

## 2020-08-03 NOTE — Procedures (Signed)
Full Name: Kaylee Bryant Gender: Female MRN #: [id number] Date of Birth: 1966-03-05    Visit Date: 08/03/2020 08:07 Age: 55 Years Examining Physician: Marcial Pacas, MD  Referring Physician: Marcial Pacas, MD History: 55 year old female complains of worsening low back pain, radiating pain to bilateral lower extremity, right worse than left, in the background of relapsing remitting multiple sclerosis, gait abnormality  Summary of the test:  Nerve conduction study:  Bilateral sural, superficial peroneal sensory responses were normal.  Bilateral peroneal to EDB, tibial motor responses were normal.  H reflex were absent bilaterally.  Electromyography: Selected needle examination of bilateral lower extremity muscles and bilateral lumbosacral paraspinal muscles were performed.  There was evidence of mild chronic neuropathic changes at bilateral L5-S1 more than L4 myotomes, there was increased insertional activity, polyphasic motor unit potential at bilateral lower lumbar spine paraspinal muscles, with occasionally fibrillations.  Conclusion: This is an abnormal study.  There is electrodiagnostic evidence of chronic bilateral lumbosacral radiculopathyies, involving bilateral L5-S1 more than L4.  There is no evidence of large fiber peripheral neuropathy.    ------------------------------- Marcial Pacas, M.D. PhD  [facility] [address] [place], [place] [address] Tel: [phone] Fax: [phone]  Verbal informed consent was obtained from the patient, patient was informed of potential risk of procedure, including bruising, bleeding, hematoma formation, infection, muscle weakness, muscle pain, numbness, among others.        [place]    Nerve / Sites Muscle Latency Ref. Amplitude Ref. Rel Amp Segments Distance Velocity Ref. Area    ms ms mV mV %  cm m/s m/s mVms  L Peroneal - EDB     Ankle EDB 4.2 ?6.5 8.0 ?2.0 100 Ankle - EDB 9   31.4     Fib head EDB 9.8  6.6   82.8 Fib head - Ankle 27 48 ?44 31.6     Pop fossa EDB 12.1  6.3  95.6 Pop fossa - Fib head 10 44 ?44 30.3         Pop fossa - Ankle      R Peroneal - EDB     Ankle EDB 3.9 ?6.5 6.7 ?2.0 100 Ankle - EDB 9   22.7     Fib head EDB 10.0  7.0  104 Fib head - Ankle 27 44 ?44 25.0     Pop fossa EDB 12.3  6.9  98.6 Pop fossa - Fib head 10 44 ?44 24.9         Pop fossa - Ankle      L Tibial - AH     Ankle AH 3.5 ?5.8 6.1 ?4.0 100 Ankle - AH 9   12.1     Pop fossa AH 12.3  4.7  76.7 Pop fossa - Ankle 36 41 ?41 12.0  R Tibial - AH     Ankle AH 3.7 ?5.8 5.4 ?4.0 100 Ankle - AH 9   8.5     Pop fossa AH 12.2  5.1  94.9 Pop fossa - Ankle 36 42 ?41 9.9             SNC    Nerve / Sites Rec. Site Peak Lat Ref.  Amp Ref. Segments Distance    ms ms V V  cm  L Sural - Ankle (Calf)     Calf Ankle 3.5 ?4.4 11 ?6 Calf - Ankle 14  R Sural - Ankle (Calf)     Calf Ankle 3.2 ?4.4  10 ?6 Calf - Ankle 14  L Superficial peroneal - Ankle     Lat leg Ankle 3.5 ?4.4 9 ?6 Lat leg - Ankle 14  R Superficial peroneal - Ankle     Lat leg Ankle 3.5 ?4.4 6 ?6 Lat leg - Ankle 14             F  Wave    Nerve F Lat Ref.   ms ms  L Tibial - AH 53.8 ?56.0  R Tibial - AH 54.1 ?56.0         H Reflex    Nerve H Lat   ms   Left Right Ref.  Tibial - [place] N/A N/A ?35.0         EMG Summary Table    Spontaneous MUAP Recruitment  Muscle IA Fib PSW Fasc Other Amp Dur. Poly Pattern  R. Tibialis anterior Normal None None None _______ Normal Increased 1+ Reduced  R. Tibialis posterior Normal None None None _______ Normal Increased 1+ Reduced  R. Peroneus longus Normal None None None _______ Normal Normal Normal Reduced  R. Gastrocnemius (Medial head) Normal None None None _______ Normal Normal Normal Reduced  R. Vastus lateralis Normal None None None _______ Normal Normal Normal Normal  L. Tibialis anterior Normal None None None _______ Normal Normal Normal Reduced  L. Tibialis posterior Normal None None None _______  Normal Normal Normal Reduced  L. Peroneus longus Normal None None None _______ Normal Normal Normal Reduced  L. Gastrocnemius (Medial head) Normal None None None _______ Normal Normal Normal Reduced  L. Vastus lateralis Normal None None None _______ Normal Normal Normal Normal  R. Lumbar paraspinals (low) Increased None None None _______ Normal Increased 1+ Normal  R. Lumbar paraspinals (mid) Increased None None None _______ Normal Normal Normal Normal  L. Lumbar paraspinals (low) Increased 1+ None None _______ Normal Increased 1+ Normal  L. Lumbar paraspinals (mid) Increased None None None _______ Normal Normal Normal Normal

## 2020-08-03 NOTE — Progress Notes (Signed)
ASSESSMENT AND PLAN 55 y.o. year old female  Relapsing remitting multiple sclerosis  -Tysabri infusions since February 2013  -Transitioned to [place] with initial injection 03/2019  Recent MRI of the brain with without contrast in March 2022 showed stable lesion, but multiple lesions involving periventricular, subcortical, pontine, there was also evidence of cervical cord, thoracic spinal cord involvement at previous MRIs  Bilateral lower extremity spasticity  -Continue baclofen 10 mg, 2 tablets 3 times a day  -Encouraged use of tizanidine 2 mg, 3 times a day dosing  She was given a trial of Lyrica 200 mg 3 times a day, complains of drowsiness, gabapentin 600 mg 3 times a day works better, we will rewrite the prescription  Depression  -Continue Effexor ER 75 mg daily  Worsening low back pain, radiating pain to right lower extremity  MRI of lumbar spine in March 2022 showed severe spinal stenosis at L4-5, this is new compared to 2019 L5-S1, disc bulging, facet hypertrophy with moderate right, severe left foraminal stenosis   Neurosurgical evaluation by Dr. Christella Noa June 06, 2020, severe L4-5 stenosis secondary to listhesis, facet arthropathy, synovial cyst, planning on decompression surgery  EMG nerve conduction study today confirmed chronic bilateral lumbosacral radiculopathy,  Bilateral lower extremity spasticity DIAGNOSTIC DATA (LABS, IMAGING, TESTING) - I reviewed patient records, labs, notes, testing and imaging myself where available.  Laboratory evaluations in February 2022, normal CBC hemoglobin 14.7, glucose was elevated 254, creatinine 0.78  May 02, 2020 B-cell (CD19+, CD20+) population is not identified. B-cells: <0.5% of the lymphocytes.   Normal IgG, IgA, decreased IgM 11  MRI brain with and without contrast in March 2022 - Stable, round and ovoid periventricular, subcortical and pontine chronic demyelinating plaques. - No acute findings.  MRI thoracic spine  (with and without) November 2020 - Chronic demyelinating plaques at C7,T6-7, T9, T10, T12-L1.  MRI cervical spine (with and without) in November 2020 - Chronic demyelinating plaques at C2, C3, C4, and C6-7 spinal cord; also noted in the brainstem.    HISTORY OF PRESENT ILLNESS: She was diagnosed with RRMS in 2007, 6 month post partum, she had numbness from waist done, diagnosis was confirmed by marked abnormal MRI brain, in March,2007. There were multiple T2/Flair periventricular lesions, corpus callosum involvement, mutiple ring enhancing lesions. Also had enhancing lesion in cervical and thoracic spines. She was treated at [facility] by Dr. Dellis Filbert, no csf study, but had extensive lab evaluations to rule out mimics. 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 She denies visual changes or other clear relapsing episodes.   MRI brain  in 01/2007, multiple T2/FLAIR lesions in both hemisphere with corpus callosum involvement, there are slight increase in lesion load. MRI cervical, stable lesions in C6-7, and C2-3. Thoracic, multiple non enhancing lesions.   She tolerated Rebif well, no flu like illness, but complains of bilateral arm, and the lateral thigh lipoatrophy with repeat Rebif injection. There was no clear relapsed over the years, but she has slow worsening gait difficulty over the past 6 months.  MRI in Sept 2012, MRI of thoracic spine Multiple chronic plaques at C7, T6-7, T8-9, T10.  MRI of cervical spine ,Multiple chronic demyelinating plaques at C3, C4 and C7. MRI of brain Multiple round and ovioid, periventricular and subcortical chronic demyelinating plaques. 2 plaques  noted in the pons. No abnormal lesions are seen on post contrast views.  Her JC-virus antibody was  negative. After discuss, we started her on Tysarbri infusion since February of 2013. She tolerated the infusion very well, but complains of excessive fatigue over the past few months, she still able to work full-time as a Marine scientist, she complains of difficulty dragging herself out of bed. Modafinil was not approved by insurance.She was put on Ritalin 10mg  tid, which helped her fatigue. feels much better on Tysabri.  JC-virus antibody negative (July 2014), Negative 0.17 in 11/11/2013,   UPDATE December 16 2013: She is doing very well, her gait has improved, tolerating [name], Ritalin 10 mg tid. for her fatigue.JC-virus antibody negative 11/11/2013 .  Most recent MRI of the brain 10/21/2013 with multiple supra tentorial and infra-tentorial chronic demyelinating plaques. No acute plaques. No change from MRI 05/22/2013. She returns for reevaluation  UPDATE April 4th 2016: She works full time as Therapist, sports, s he has more dizziness spells, especially when she has to stand up, she has occasional headaches, She complains of intermittent sharp pain at her right lower thoracic region, X-ray showed DJD, excerbated by walking, Orthopedic evaluation was normal, including bilateral hip,  She is on Tysarbri,taking baclofen 10mg  tid, works well for her lower extremity She has no incotninence, if she stopped,   UPDATE July 26th 2016:She is now complainng of right side low back pain, radiating to right hip, right hip muscle cramping, sometimes difficulty getting out of the bed, to put on her clothes, 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 She now noticed more fatigue, depression, also noticed mild worsening unsteady gait  Laboratory evaluations, ferritin 54, vitamin V78, folic acid, iron level was normal, LDL 44, cholesterol 96, A1c 6.4, normal BMP, UA,  UPDATE Apr 08 2017: She has been receiving Tysarbri infusion, last one was in Jan 2019, she is overall doing very well, still has baseline mild gait abnormality, fatigue,  UPDATE July 10 2017: She came in today for EMG guided xeomin injection for frequent facial muscle spasm, sometimes to the point of affecting ability to form the word, open her mouth,  UPDATE October 16 2017: She reported MRI of lumbar recently by orthopedic surgeon Dr. Gladstone Lighter showed only mild degenerative changes, but increased upper back to lower back muscle spasm, despite higher dose of baclofen 80 mg daily, 20/40/20,  Over the years, she complains of progressive worsening gait abnormality.  She has been on Tysabri since February 2013.  She reported progressive lower extremity low back muscle spasm, urinary urgency, difficulty falling visual tract,  UPDATE Feb 11 2019: She complains of worsening bilateral lower extremity muscle spasm, gait abnormality, also worsening low back pain, she has taken 2 months off.  I personally reviewed CT lumbar spine myelogram in March 2019: Severe L4-5 facet arthrosis, with associated  grade 1 anterior akinesis and mild bilateral neuroforaminal stenosis.  Advanced disc degeneration at L5-S1, with mild right, moderate to severe left foraminal stenosis  She denies bowel and bladder issues, does complains of daytime fatigue, especially after her baclofen, gabapentin, tizanidine for muscle spasticity  We personally reviewed MRI of brain  with without contrast November 2020: Multiple round and ovoid periventricular and subcortical, pontine chronic demyelinating plaque, MRI of cervical spine, chronic demyelinating plaque at C2, 3, 4, C6-7, also noted in the brainstem.  MRI of thoracic spine, chronic demyelinating plaque at C7, T6-7, T9, T10, T12-L1.  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, with short staffed, she has to work extra hours, does complains of fatigue, on polypharmacy treatment, including Effexor XR 75 mg daily, baclofen 10 mg 2 tablets 3 times a day, tizanidine 2 mg 3 times a day, also on gabapentin 600 mg 3 times a day  Recently she has intermittent sharp radiating pain at right face from the right forehead to break in the eye corner, to below cheek area, gabapentin provide limited help,  Last MRI of the brain was in November 2020, personally reviewed the films, MRI of the brain multiple round and oval periventricular, subcortical, pontine chronic demyelinating plaques, no acute abnormality, no change from MRI compared with January 2019, MRI of cervical and thoracic spine showed chronic demyelinating plaque at C2, C3, 4, and C6- C7, T6-7, T9, T10, T12 and L1, no enhancement  Update Aug 03, 2020 She return for electrodiagnostic study today, which showed chronic bilateral lumbosacral radiculopathy, involving bilateral L5, S1, more than L4 nerve roots,  She complains of gait abnormality, mainly due to spasticity of lower extremity, intermittent to moderate radiating pain from back to bilateral lower extremity,  We personally reviewed MRI of the brain in  March 2022, moderate lesion load supratentorium region, no contrast-enhancement,  MRI of lumbar spine showed L4-5 central disc bulging, facet hypertrophy, right posterior lateral bone spurring resulting in severe spinal stenosis, progressed compared to 2019, L5-S1, moderate right and severe left foraminal stenosis  She was seen by neurosurgeon, planning on to have elective lumbar decompression surgery  She no longer have significant facial pain, Lyrica 200 mg 3 times a day caused excessive fatigue, drugged sensation, does not work as well as previous gabapentin 600 mg 3 times daily for her lower extremity radiating pain, spasticity, still works full-time as a Garment/textile technologist facility,  PHYSICAL EXAM  There were no vitals filed for this visit. There is no height or weight on file to calculate BMI.   PHYSICAL EXAMNIATION:  Gen: NAD, conversant, well nourised, well groomed                     Cardiovascular: Regular rate rhythm, no peripheral edema, warm, nontender. Eyes: Conjunctivae clear without exudates or hemorrhage Neck: Supple, no carotid bruits. Pulmonary: Clear to auscultation bilaterally   NEUROLOGICAL EXAM:  MENTAL STATUS: Speech/Cognition: Awake, alert, normal speech, oriented to history taking and casual conversation.  CRANIAL NERVES: CN II: Visual fields are full to confrontation.  Pupils are round equal and briskly reactive to light. CN III, IV, VI: extraocular movement  are normal. No ptosis. CN V: Facial sensation is intact to light touch. CN VII: Face is symmetric with normal eye closure and smile. CN VIII: Hearing is normal to casual conversation CN IX, X: Phonation is normal. CN XI: Head turning and shoulder shrug are intact   MOTOR: Mild bilateral lower extremity spasticity, there was mild bilateral hip flexion weakness, no significant distal leg weakness  REFLEXES: Reflexes are 2  and symmetric at the biceps, triceps, knees and absent at ankles. Plantar  responses are flexor.  SENSORY: Length dependent decreased vibratory sensation, light touch, pinprick to mid shin level  COORDINATION: There is no trunk or limb ataxia.    GAIT/STANCE: She can get up from seated position arm crossed, stiff, cautious, mildly unsteady, dragging right leg more,   REVIEW OF SYSTEMS: Out of a complete 14 system review of symptoms, the patient complains only of the following symptoms, and all other reviewed systems are negative. See HPI  ALLERGIES: No Known Allergies  HOME MEDICATIONS: Outpatient Medications Prior to Visit  Medication Sig Dispense Refill  . acetaminophen (TYLENOL) 500 MG tablet Take 1,000 mg by mouth every 6 (six) hours as needed for mild pain.    Marland Kitchen aspirin EC 81 MG tablet Take 81 mg by mouth daily.    Marland Kitchen atorvastatin (LIPITOR) 80 MG tablet TAKE 1 TABLET (80 MG TOTAL) BY MOUTH DAILY AT 6 PM. 30 tablet 1  . baclofen (LIORESAL) 10 MG tablet TAKE 2 TABLETS (20 MG TOTAL) BY MOUTH 3 (THREE) TIMES DAILY. 540 tablet 3  . glipiZIDE (GLUCOTROL XL) 5 MG 24 hr tablet Take 10 mg by mouth daily. with food  3  . ibuprofen (ADVIL) 800 MG tablet Take 800 mg by mouth every 8 (eight) hours as needed.    Marland Kitchen lisinopril (ZESTRIL) 10 MG tablet Take 1 tablet (10 mg total) by mouth in the morning and at bedtime. 60 tablet 1  . metFORMIN (GLUCOPHAGE-XR) 500 MG 24 hr tablet Take 1,000 mg by mouth 2 (two) times daily.     . metoprolol tartrate (LOPRESSOR) 25 MG tablet Take 1 tablet (25 mg total) by mouth 2 (two) times daily. 60 tablet 1  . ocrelizumab (OCREVUS) 300 MG/10ML injection Inject 300 mg into the vein as directed.    . pregabalin (LYRICA) 200 MG capsule Take 1 capsule (200 mg total) by mouth in the morning, at noon, and at bedtime. 90 capsule 5  . tiZANidine (ZANAFLEX) 2 MG tablet Take 1 tablet (2 mg total) by mouth 3 (three) times daily. 270 tablet 3  . venlafaxine XR (EFFEXOR-XR) 75 MG 24 hr capsule TAKE 1 CAPSULE (75 MG TOTAL) BY MOUTH DAILY WITH  BREAKFAST. 90 capsule 3   No facility-administered medications prior to visit.    PAST MEDICAL HISTORY: Past Medical History:  Diagnosis Date  . 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 DDD (degenerative disc disease), lumbar   . DM (diabetes mellitus), gestational 2006   insulin at the time  . Fatigue   . Heart attack ([place])   . IBS (irritable bowel syndrome)    with chronic constipation  . Multiple sclerosis ([place])   . Obesity   . 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 CARDIAC CATHETERIZATION N/A 04/05/2015   Procedure: Left Heart Cath  and Coronary Angiography;  Surgeon: Troy Sine, MD;  Location: [facility];  Service: Cardiovascular;  Laterality: N/A;  . CERVICAL CONE BIOPSY  2004  . [place], 2006  . CHOLECYSTECTOMY     1988  . COLPOSCOPY  2004  . CORONARY ARTERY BYPASS GRAFT N/A 04/07/2015   Procedure: CORONARY ARTERY BYPASS GRAFTING (CABG), OFF PUMP, TIMES ONE, USING LEFT INTERNAL MAMMARY ARTERY;  Surgeon: Rexene Alberts, MD;  Location: [facility];  Service: Open Heart Surgery;  Laterality: N/A;  LIMA to LAD  . TEE WITHOUT CARDIOVERSION N/A 04/07/2015   Procedure: TRANSESOPHAGEAL ECHOCARDIOGRAM (TEE);  Surgeon: Rexene Alberts, MD;  Location: [facility];  Service: Open Heart Surgery;  Laterality: N/A;    FAMILY HISTORY: Family History  Problem Relation Age of Onset  . Diabetes Mother   . Hypertension Mother   . Hypertension Father   . Atrial fibrillation Father   . Diabetes Sister   . Breast cancer Maternal Aunt   . Arrhythmia Sister   . Heart attack Maternal Grandfather   . Stroke Neg Hx     SOCIAL HISTORY: Social History   Socioeconomic History  . Marital status: Married    Spouse name: Not on file  . Number of children: 4  . Years of  education: college  . Highest education level: Not on file  Occupational History  . Occupation: Programmer, multimedia: [employer]  Tobacco Use  . Smoking status: Former Smoker    Packs/day: 1.50    Years: 15.00    Pack years: 22.50    Types: Cigarettes    Quit date: 02/03/2004    Years since quitting: 16.5  . Smokeless tobacco: Never Used  Substance and Sexual Activity  . Alcohol use: Yes    Alcohol/week: 1.0 standard drink    Types: 1 Glasses of wine per week  . Drug use: No  . Sexual activity: Yes    Partners: Male    Birth control/protection: Surgical    Comment: vasectomy  Other Topics Concern  . Not on file  Social History Narrative   Patient lives at home with her husband and children. Patient works for the [employer] in [place]. College education.    Social Determinants of Health   Financial Resource Strain: Not on file  Food Insecurity: Not on file  Transportation Needs: Not on file  Physical Activity: Not on file  Stress: Not on file  Social Connections: Not on file  Intimate Partner Violence: Not on file      Marcial Pacas, M.D. Ph.D.  [facility] [place], [place] [address] Phone: [phone] Fax:      [phone]

## 2020-08-09 ENCOUNTER — Encounter: Payer: Self-pay | Admitting: Cardiovascular Disease

## 2020-08-09 ENCOUNTER — Other Ambulatory Visit: Payer: Self-pay | Admitting: Cardiovascular Disease

## 2020-08-09 ENCOUNTER — Ambulatory Visit: Payer: BC Managed Care – PPO | Admitting: Cardiovascular Disease

## 2020-08-09 ENCOUNTER — Ambulatory Visit (INDEPENDENT_AMBULATORY_CARE_PROVIDER_SITE_OTHER): Payer: Managed Care, Other (non HMO) | Admitting: Cardiovascular Disease

## 2020-08-09 ENCOUNTER — Other Ambulatory Visit: Payer: Self-pay

## 2020-08-09 DIAGNOSIS — I10 Essential (primary) hypertension: Secondary | ICD-10-CM

## 2020-08-09 DIAGNOSIS — E785 Hyperlipidemia, unspecified: Secondary | ICD-10-CM

## 2020-08-09 DIAGNOSIS — Z951 Presence of aortocoronary bypass graft: Secondary | ICD-10-CM | POA: Diagnosis not present

## 2020-08-09 NOTE — Patient Instructions (Signed)
Medication Instructions:  Your physician recommends that you continue on your current medications as directed. Please refer to the Current Medication list given to you today.  *If you need a refill on your cardiac medications before your next appointment, please call your pharmacy*   Lab Work: Your physician recommends that you return for lab work in: next week or 2 for fasting lipid/liver profile.  If you have labs (blood work) drawn today and your tests are completely normal, you will receive your results only by: Marland Kitchen MyChart Message (if you have MyChart) OR . A paper copy in the mail If you have any lab test that is abnormal or we need to change your treatment, we will call you to review the results.   Follow-Up: At [facility], you and your health needs are our priority.  As part of our continuing mission to provide you with exceptional heart care, we have created designated Provider Care Teams.  These Care Teams include your primary Cardiologist (physician) and Advanced Practice Providers (APPs -  Physician Assistants and Nurse Practitioners) who all work together to provide you with the care you need, when you need it.  We recommend signing up for the patient portal called "MyChart".  Sign up information is provided on this After Visit Summary.  MyChart is used to connect with patients for Virtual Visits (Telemedicine).  Patients are able to view lab/test results, encounter notes, upcoming appointments, etc.  Non-urgent messages can be sent to your provider as well.   To learn more about what you can do with MyChart, go to [email or web address].    Your next appointment:   12 month(s)  The format for your next appointment:   In Person  Provider:   Quay Burow, MD

## 2020-08-09 NOTE — Assessment & Plan Note (Signed)
History of non-STEMI 04/05/2015.  She underwent cardiac catheterization by Dr. Claiborne Billings revealing a 95% true ostial LAD lesion and 2 days later underwent off-pump LIMA placement by Dr. Roxy Manns  She is done well since this totally asymptomatic.

## 2020-08-09 NOTE — Progress Notes (Signed)
08/09/2020 Kaylee Bryant   1965-07-08  [id number]  Primary Physician Kaylee Small, MD Primary Cardiologist: Kaylee Harp MD Kaylee Bryant, [place]  HPI:  Kaylee Bryant is a 55 y.o.   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 She had a non-STEMI on 04/05/15 with unstable angina. She is catheterized by Dr. Claiborne Bryant revealing a 95% ostial LAD lesion and 2 days later underwent off-pump LIMA placement by Dr. Roxy Bryant. She participated in cardiac rehabilitation briefly.  Since I saw her a year ago she continues to do well.  She denies chest pain or shortness of breath.  She unfortunately has spinal stenosis which is led to a right lower extremity neuropathy.  She probably needs this to be surgically addressed.    Current Meds  Medication Sig  . acetaminophen (TYLENOL) 500 MG tablet Take 1,000 mg by mouth every 6 (six) hours as needed for mild pain.  Marland Kitchen aspirin EC 81 MG tablet Take 81 mg by mouth daily.  Marland Kitchen atorvastatin (LIPITOR) 80 MG tablet TAKE 1 TABLET (80 MG TOTAL) BY MOUTH DAILY AT 6 PM.  . baclofen (LIORESAL) 10 MG tablet TAKE 2 TABLETS (20 MG TOTAL) BY MOUTH 3 (THREE) TIMES DAILY.  Marland Kitchen gabapentin (NEURONTIN) 300 MG capsule Take 2 capsules (600 mg total) by mouth 3 (three) times daily.  Marland Kitchen glipiZIDE (GLUCOTROL XL) 10 MG 24 hr tablet Take 10 mg by mouth daily.  Marland Kitchen ibuprofen (ADVIL) 800 MG tablet Take 800 mg by mouth every 8 (eight) hours as needed.  Marland Kitchen lisinopril (ZESTRIL) 10 MG tablet Take 1 tablet (10 mg total) by mouth in the morning and at bedtime.  . metFORMIN (GLUCOPHAGE-XR) 500 MG 24 hr tablet Take 1,000 mg by mouth 2 (two) times daily.   . metoprolol tartrate (LOPRESSOR) 25 MG tablet Take 1 tablet (25 mg total) by mouth 2 (two) times daily.  Marland Kitchen ocrelizumab (OCREVUS) 300 MG/10ML  injection Inject 300 mg into the vein as directed.  Marland Kitchen tiZANidine (ZANAFLEX) 2 MG tablet Take 1 tablet (2 mg total) by mouth 3 (three) times daily.  Marland Kitchen venlafaxine XR (EFFEXOR-XR) 75 MG 24 hr capsule TAKE 1 CAPSULE (75 MG TOTAL) BY MOUTH DAILY WITH BREAKFAST.     No Known Allergies  Social History   Socioeconomic History  . Marital status: Married    Spouse name: Not on file  . Number of children: 4  . Years of education: college  . Highest education level: Not on file  Occupational History  . Occupation: Programmer, multimedia: [employer]  Tobacco Use  . Smoking status: Former Smoker    Packs/day: 1.50    Years: 15.00    Pack years: 22.50    Types: Cigarettes    Quit date: 02/03/2004    Years since quitting: 16.5  . Smokeless tobacco: Never Used  Substance and Sexual Activity  . Alcohol use: Yes    Alcohol/week: 1.0 standard drink    Types: 1 Glasses of wine per week  . Drug use: No  . Sexual activity: Yes    Partners: Male    Birth control/protection: Surgical    Comment: vasectomy  Other Topics Concern  . Not on file  Social History Narrative   Patient lives at home  with her husband and children. Patient works for the [employer] in [place]. College education.    Social Determinants of Health   Financial Resource Strain: Not on file  Food Insecurity: Not on file  Transportation Needs: Not on file  Physical Activity: Not on file  Stress: Not on file  Social Connections: Not on file  Intimate Partner Violence: Not on file     Review of Systems: General: negative for chills, fever, night sweats or weight changes.  Cardiovascular: negative for chest pain, dyspnea on exertion, edema, orthopnea, palpitations, paroxysmal nocturnal dyspnea or shortness of breath Dermatological: negative for rash Respiratory: negative for cough or wheezing Urologic: negative for hematuria Abdominal: negative for nausea, vomiting, diarrhea, bright red blood per rectum, melena, or  hematemesis Neurologic: negative for visual changes, syncope, or dizziness All other systems reviewed and are otherwise negative except as noted above.    Blood pressure (!) 132/98, pulse 63, height 5\' 3"  (1.6 m), weight 197 lb (89.4 kg), last menstrual period 10/03/2014, SpO2 95 %.  General appearance: alert and no distress Neck: no adenopathy, no carotid bruit, no JVD, supple, symmetrical, trachea midline and thyroid not enlarged, symmetric, no tenderness/mass/nodules Lungs: clear to auscultation bilaterally Heart: regular rate and rhythm, S1, S2 normal, no murmur, click, rub or gallop Extremities: extremities normal, atraumatic, no cyanosis or edema Pulses: 2+ and symmetric Skin: Skin color, texture, turgor normal. No rashes or lesions Neurologic: Alert and oriented X 3, normal strength and tone. Normal symmetric reflexes. Normal coordination and gait  EKG sinus rhythm at 63 with poor R wave progression consistent with old anterolateral infarct.  She has low limb voltage.  I personally reviewed this EKG.  ASSESSMENT AND PLAN:   S/P CABG x 1 History of non-STEMI 04/05/2015.  She underwent cardiac catheterization by Dr. Claiborne Bryant revealing a 95% true ostial LAD lesion and 2 days later underwent off-pump LIMA placement by Dr. Roxy Bryant  She is done well since this totally asymptomatic.  Dyslipidemia History of dyslipidemia on statin therapy with lipid profile performed 07/29/2019 revealing a total cholesterol of 110, LDL 60 and HDL of 29.  We will recheck a fasting lipid and liver profile.  Essential hypertension History of essential hypertension blood pressure measured today at 132/98.  She checks her blood pressure at home and it usually runs in the 130/80 range.  She is on lisinopril and metoprolol.      Kaylee Harp MD FACP,FACC,FAHA, [facility] 08/09/2020 9:35 AM

## 2020-08-09 NOTE — Assessment & Plan Note (Signed)
History of essential hypertension blood pressure measured today at 132/98.  She checks her blood pressure at home and it usually runs in the 130/80 range.  She is on lisinopril and metoprolol.

## 2020-08-09 NOTE — Assessment & Plan Note (Signed)
History of dyslipidemia on statin therapy with lipid profile performed 07/29/2019 revealing a total cholesterol of 110, LDL 60 and HDL of 29.  We will recheck a fasting lipid and liver profile.

## 2020-08-24 ENCOUNTER — Telehealth: Payer: Self-pay | Admitting: Neurology

## 2020-08-24 NOTE — Telephone Encounter (Signed)
She gets her Ocrevus w/ [facility]. She started this medication in January 2021. Next scheduled infusion on 01/09/21. This call is pertaining to her pharmacy benefits. Message will be provided to [facility].

## 2020-08-24 NOTE — Telephone Encounter (Signed)
At 3:43 Patty(Case Manager @ [facility])  left a vm, they are the pharmacy Dispensing optician for pt.  Pattyis asking for a call to discuss pt's Ocrevus [facility]fusion.  Please call her at [phone] xt [phone]

## 2020-08-25 ENCOUNTER — Ambulatory Visit: Payer: Managed Care, Other (non HMO) | Admitting: Family Medicine

## 2020-09-27 ENCOUNTER — Ambulatory Visit: Payer: Managed Care, Other (non HMO) | Admitting: Neurology

## 2020-10-31 ENCOUNTER — Ambulatory Visit: Payer: Managed Care, Other (non HMO) | Admitting: Neurology

## 2020-12-15 ENCOUNTER — Ambulatory Visit (INDEPENDENT_AMBULATORY_CARE_PROVIDER_SITE_OTHER): Payer: Managed Care, Other (non HMO) | Admitting: Family Medicine

## 2020-12-15 ENCOUNTER — Encounter: Payer: Self-pay | Admitting: Family Medicine

## 2020-12-15 ENCOUNTER — Other Ambulatory Visit: Payer: Self-pay

## 2020-12-15 VITALS — BP 138/84 | HR 64 | Temp 98.0°F | Ht 63.0 in | Wt 195.0 lb

## 2020-12-15 DIAGNOSIS — I2510 Atherosclerotic heart disease of native coronary artery without angina pectoris: Secondary | ICD-10-CM

## 2020-12-15 DIAGNOSIS — E1165 Type 2 diabetes mellitus with hyperglycemia: Secondary | ICD-10-CM

## 2020-12-15 DIAGNOSIS — E1169 Type 2 diabetes mellitus with other specified complication: Secondary | ICD-10-CM

## 2020-12-15 DIAGNOSIS — F339 Major depressive disorder, recurrent, unspecified: Secondary | ICD-10-CM

## 2020-12-15 DIAGNOSIS — I152 Hypertension secondary to endocrine disorders: Secondary | ICD-10-CM

## 2020-12-15 DIAGNOSIS — E1159 Type 2 diabetes mellitus with other circulatory complications: Secondary | ICD-10-CM

## 2020-12-15 DIAGNOSIS — G35 Multiple sclerosis: Secondary | ICD-10-CM

## 2020-12-15 DIAGNOSIS — E785 Hyperlipidemia, unspecified: Secondary | ICD-10-CM

## 2020-12-15 DIAGNOSIS — I252 Old myocardial infarction: Secondary | ICD-10-CM

## 2020-12-15 DIAGNOSIS — Z951 Presence of aortocoronary bypass graft: Secondary | ICD-10-CM

## 2020-12-15 LAB — BAYER DCA HB A1C WAIVED: HB A1C (BAYER DCA - WAIVED): 9.8 % — ABNORMAL HIGH (ref 4.8–5.6)

## 2020-12-15 MED ORDER — FREESTYLE LIBRE 2 SENSOR MISC: 1.0000 | Freq: Four times a day (QID) | 6 refills | Status: DC

## 2020-12-15 MED ORDER — FREESTYLE LIBRE 2 READER DEVI: 1.0000 | Freq: Four times a day (QID) | 0 refills | Status: DC

## 2020-12-15 MED ORDER — OZEMPIC (0.25 OR 0.5 MG/DOSE) 2 MG/1.5ML ~~LOC~~ SOPN: PEN_INJECTOR | SUBCUTANEOUS | 6 refills | Status: AC

## 2020-12-15 MED ORDER — VENLAFAXINE HCL ER 150 MG PO CP24: 150.0000 mg | ORAL_CAPSULE | Freq: Every day | ORAL | 1 refills | Status: DC

## 2020-12-15 NOTE — Progress Notes (Signed)
Subjective:  Patient ID: Kaylee Bryant, female    DOB: 01/22/1966, 55 y.o.   MRN: [id number]  Patient Care Team: Baruch Gouty, FNP as PCP - General (Family Medicine)   Chief Complaint:  New Patient (Initial Visit) (Diabetes, depression)   HPI: Kaylee Bryant is a 55 y.o. female presenting on 12/15/2020 for New Patient (Initial Visit) (Diabetes, depression)  Pt presents today to establish care with new PCP. She was formerly followed by Dr. Maurice Small but has not seen her in over 2 years. She does follow with cardiology and neurology on a regular basis. She has a complex medical history. She has T2DM, CAD s/p CABG in 2017 (LIMA to LAD), hyperlipidemia, MS, spinal stenosis, depression, and IBS. She states her MS is well controlled for the most part, she does have issues with spasticity of her RLE at times causing abnormal gait. Last MRI revealed stable lesions. She denies any cardiac symptoms. BP has been well controlled. She is compliant with her medications without adverse effects. She has not had a PAP in several years, has had an abnormal PAP in the past but reports last two have been normal. Mammogram and colonoscopy are up to date, records requested. She is due for foot and eye exam, will make appointments. Her biggest concern today is depression and diabetes. She reports her daughter moved out recently causing an increase in her depression. She has been on Effexor for several years but feels the depression has worsened significantly over the last few months. No SI or HI.  She has been taking her diabetic medications but does not check her blood sugar. She does have increased thirst, hunger, and urination. No visual changes or confusion.   Depression screen [facility] 2/9 12/15/2020 05/10/2015  Decreased Interest 3 0  Down, Depressed, Hopeless 2 0  PHQ - 2 Score 5 0  Altered sleeping 1 2  Tired, decreased energy 3 2  Change in appetite 3 0  Feeling bad or failure about yourself  1 0  Trouble  concentrating 2 1  Moving slowly or fidgety/restless 0 0  Suicidal thoughts 0 0  PHQ-9 Score 15 5  Difficult doing work/chores Somewhat difficult Somewhat difficult  Some recent data might be hidden   GAD 7 : Generalized Anxiety Score 12/15/2020  Nervous, Anxious, on Edge 1  Control/stop worrying 0  Worry too much - different things 0  Trouble relaxing 1  Restless 0  Easily annoyed or irritable 0  Afraid - awful might happen 0  Total GAD 7 Score 2  Anxiety Difficulty Somewhat difficult     Relevant past medical, surgical, family, and social history reviewed and updated as indicated.  Allergies and medications reviewed and updated. Data reviewed: Chart in Epic.   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 ([place]) 04/05/2015   DDD (degenerative disc disease), lumbar    DM (diabetes mellitus), gestational 2006   insulin at the time   Fatigue    Heart attack ([place])    IBS (irritable bowel syndrome)    with chronic constipation   Multiple sclerosis ([place])    Obesity    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   LIMA to LAD off pump    Past Surgical History:  Procedure Laterality Date   CARDIAC CATHETERIZATION N/A 04/05/2015   Procedure: Left Heart Cath and Coronary Angiography;  Surgeon: Troy Sine, MD;  Location: [facility];  Service: Cardiovascular;  Laterality: N/A;   CERVICAL CONE BIOPSY  2004   CESAREAN SECTION     1996, 2006   CHOLECYSTECTOMY     1988   COLPOSCOPY  2004   CORONARY ARTERY BYPASS GRAFT N/A 04/07/2015   Procedure: CORONARY ARTERY BYPASS GRAFTING (CABG), OFF PUMP, TIMES ONE, USING LEFT INTERNAL MAMMARY ARTERY;  Surgeon: Rexene Alberts, MD;  Location: [facility];  Service: Open Heart Surgery;  Laterality: N/A;  LIMA to LAD   TEE WITHOUT CARDIOVERSION N/A 04/07/2015   Procedure: TRANSESOPHAGEAL ECHOCARDIOGRAM (TEE);  Surgeon: Rexene Alberts,  MD;  Location: [facility];  Service: Open Heart Surgery;  Laterality: N/A;    Social History   Socioeconomic History   Marital status: Married    Spouse name: Not on file   Number of children: 4   Years of education: college   Highest education level: Not on file  Occupational History   Occupation: Programmer, multimedia: [employer]  Tobacco Use   Smoking status: Former    Packs/day: 1.50    Years: 15.00    Pack years: 22.50    Types: Cigarettes    Quit date: 02/03/2004    Years since quitting: 16.8   Smokeless tobacco: Never  Substance and Sexual Activity   Alcohol use: Yes    Alcohol/week: 1.0 standard drink    Types: 1 Glasses of wine per week   Drug use: No   Sexual activity: Yes    Partners: Male    Birth control/protection: Surgical    Comment: vasectomy  Other Topics Concern   Not on file  Social History Narrative   Patient lives at home with her husband and children. Patient works for the [employer] in [place]. College education.    Social Determinants of Health   Financial Resource Strain: Not on file  Food Insecurity: Not on file  Transportation Needs: Not on file  Physical Activity: Not on file  Stress: Not on file  Social Connections: Not on file  Intimate Partner Violence: Not on file    Outpatient Encounter Medications as of 12/15/2020  Medication Sig   acetaminophen (TYLENOL) 500 MG tablet Take 1,000 mg by mouth every 6 (six) hours as needed for mild pain.   aspirin EC 81 MG tablet Take 81 mg by mouth daily.   atorvastatin (LIPITOR) 80 MG tablet TAKE 1 TABLET BY MOUTH DAILY AT 6 PM.   baclofen (LIORESAL) 10 MG tablet TAKE 2 TABLETS (20 MG TOTAL) BY MOUTH 3 (THREE) TIMES DAILY.   Continuous Blood Gluc Receiver (FREESTYLE LIBRE 2 READER) DEVI 1 Device by Does not apply route 4 (four) times daily.   Continuous Blood Gluc Sensor (FREESTYLE LIBRE 2 SENSOR) MISC 1 Device by Does not apply route 4 (four) times daily.   gabapentin (NEURONTIN) 300 MG  capsule Take 2 capsules (600 mg total) by mouth 3 (three) times daily.   glipiZIDE (GLUCOTROL XL) 10 MG 24 hr tablet Take 10 mg by mouth daily.   ibuprofen (ADVIL) 800 MG tablet Take 800 mg by mouth every 8 (eight) hours as needed.   lisinopril (ZESTRIL) 10 MG tablet TAKE 1 TABLET (10 MG TOTAL) BY MOUTH IN THE MORNING AND AT BEDTIME.   metFORMIN (GLUCOPHAGE-XR) 500 MG 24 hr tablet Take 1,000 mg by mouth 2 (two) times daily.    metoprolol tartrate (LOPRESSOR) 25 MG tablet TAKE 1 TABLET BY  MOUTH TWICE A DAY   ocrelizumab (OCREVUS) 300 MG/10ML injection Inject 300 mg into the vein as directed.   Semaglutide,0.25 or 0.5MG/DOS, (OZEMPIC, 0.25 OR 0.5 MG/DOSE,) 2 MG/1.5ML SOPN Inject 0.25 mg into the skin once a week for 28 days, THEN 0.5 mg once a week for 28 days, THEN 1 mg once a week for 28 days.   tiZANidine (ZANAFLEX) 2 MG tablet Take 1 tablet (2 mg total) by mouth 3 (three) times daily.   venlafaxine XR (EFFEXOR XR) 150 MG 24 hr capsule Take 1 capsule (150 mg total) by mouth daily with breakfast.   [DISCONTINUED] venlafaxine XR (EFFEXOR-XR) 75 MG 24 hr capsule TAKE 1 CAPSULE (75 MG TOTAL) BY MOUTH DAILY WITH BREAKFAST.   No facility-administered encounter medications on file as of 12/15/2020.    No Known Allergies  Review of Systems  Constitutional:  Positive for activity change, appetite change and fatigue. Negative for chills, diaphoresis, fever and unexpected weight change.  HENT: Negative.    Eyes: Negative.  Negative for photophobia and visual disturbance.  Respiratory:  Negative for cough, chest tightness and shortness of breath.   Cardiovascular:  Negative for chest pain, palpitations and leg swelling.  Gastrointestinal:  Negative for abdominal pain, blood in stool, constipation, diarrhea, nausea and vomiting.  Endocrine: Positive for polydipsia, polyphagia and polyuria.  Genitourinary:  Negative for decreased urine volume, difficulty urinating, dysuria, frequency and urgency.   Musculoskeletal:  Positive for arthralgias, back pain, gait problem and myalgias. Negative for joint swelling, neck pain and neck stiffness.  Skin: Negative.   Allergic/Immunologic: Negative.   Neurological:  Positive for weakness. Negative for dizziness, tremors, seizures, syncope, facial asymmetry, speech difficulty, light-headedness, numbness and headaches.  Hematological: Negative.   Psychiatric/Behavioral:  Positive for agitation, decreased concentration, dysphoric mood and sleep disturbance. Negative for behavioral problems, confusion, hallucinations, self-injury and suicidal ideas. The patient is nervous/anxious. The patient is not hyperactive.   All other systems reviewed and are negative.      Objective:  BP 138/84   Pulse 64   Temp 98 F (36.7 C)   Ht _0  (1.6 m)   Wt 195 lb (88.5 kg)   LMP 10/03/2014 Comment: last period was approx 6 months ago, "i don't have it anymore"  SpO2 96%   BMI 34.54 kg/m    Wt Readings from Last 3 Encounters:  12/15/20 195 lb (88.5 kg)  08/09/20 197 lb (89.4 kg)  05/02/20 194 lb 8 oz (88.2 kg)    Physical Exam Vitals and nursing note reviewed.  Constitutional:      General: She is not in acute distress.    Appearance: Normal appearance. She is well-developed and well-groomed. She is obese. She is not ill-appearing, toxic-appearing or diaphoretic.  HENT:     Head: Normocephalic and atraumatic.     Jaw: There is normal jaw occlusion.     Right Ear: Hearing, tympanic membrane, ear canal and external ear normal.     Left Ear: Hearing, tympanic membrane, ear canal and external ear normal.     Nose: Nose normal.     Mouth/Throat:     Lips: Pink.     Mouth: Mucous membranes are moist.     Pharynx: Oropharynx is clear. Uvula midline.  Eyes:     General: Lids are normal.     Extraocular Movements: Extraocular movements intact.     Conjunctiva/sclera: Conjunctivae normal.     Pupils: Pupils are equal, round, and reactive to light.  Neck:  Thyroid: No thyroid mass, thyromegaly or thyroid tenderness.     Vascular: No carotid bruit or JVD.     Trachea: Trachea and phonation normal.  Cardiovascular:     Rate and Rhythm: Normal rate and regular rhythm.     Chest Wall: PMI is not displaced.     Pulses: Normal pulses.     Heart sounds: Normal heart sounds. No murmur heard.   No friction rub. No gallop.  Pulmonary:     Effort: Pulmonary effort is normal. No respiratory distress.     Breath sounds: Normal breath sounds. No wheezing.  Chest:     Comments: Sternotomy well healed Abdominal:     General: Abdomen is protuberant. Bowel sounds are normal. There is no distension or abdominal bruit.     Palpations: Abdomen is soft. There is no hepatomegaly or splenomegaly.     Tenderness: There is no abdominal tenderness. There is no right CVA tenderness or left CVA tenderness.     Hernia: No hernia is present.  Musculoskeletal:        General: Normal range of motion.     Cervical back: Normal range of motion and neck supple.     Right lower leg: No edema.     Left lower leg: No edema.  Lymphadenopathy:     Cervical: No cervical adenopathy.  Skin:    General: Skin is warm and dry.     Capillary Refill: Capillary refill takes less than 2 seconds.     Coloration: Skin is not cyanotic, jaundiced or pale.     Findings: No rash.  Neurological:     General: No focal deficit present.     Mental Status: She is alert and oriented to person, place, and time.     Cranial Nerves: Cranial nerves are intact. No cranial nerve deficit.     Sensory: Sensation is intact.     Motor: Weakness (slight weakness and spasticity of RLE) present.     Coordination: Coordination is intact.     Gait: Gait abnormal.     Deep Tendon Reflexes: Reflexes are normal and symmetric.  Psychiatric:        Attention and Perception: Attention and perception normal.        Mood and Affect: Mood and affect normal.        Speech: Speech normal.        Behavior:  Behavior normal. Behavior is cooperative.        Thought Content: Thought content normal.        Cognition and Memory: Cognition and memory normal.        Judgment: Judgment normal.    Results for orders placed or performed in visit on 12/15/20  Bayer DCA Hb A1c Waived  Result Value Ref Range   HB A1C (BAYER DCA - WAIVED) 9.8 (H) 4.8 - 5.6 %       Pertinent labs & imaging results that were available during my care of the patient were reviewed by me and considered in my medical decision making.  Assessment & Plan:  Meris was seen today for new patient (initial visit).  Diagnoses and all orders for this visit:  Uncontrolled type 2 diabetes mellitus with hyperglycemia ([place]) A1C 9.8 in office. Will add Ozempic to regimen. Can possibly stop glipizide if A1C at goal with addition of Ozempic. Pt aware to have eye and foot exam. Libre ordered for continuous blood sugar monitoring. Diet and exercise encouraged. Labs pending.  -  CBC with Differential/Platelet -     CMP14+EGFR -     Microalbumin / creatinine urine ratio -     Lipid panel -     Thyroid Panel With TSH -     Bayer DCA Hb A1c Waived -     Semaglutide,0.25 or 0.5MG/DOS, (OZEMPIC, 0.25 OR 0.5 MG/DOSE,) 2 MG/1.5ML SOPN; Inject 0.25 mg into the skin once a week for 28 days, THEN 0.5 mg once a week for 28 days, THEN 1 mg once a week for 28 days. -     Continuous Blood Gluc Sensor (FREESTYLE LIBRE 2 SENSOR) MISC; 1 Device by Does not apply route 4 (four) times daily. -     Continuous Blood Gluc Receiver (FREESTYLE LIBRE 2 READER) DEVI; 1 Device by Does not apply route 4 (four) times daily.  Hypertension associated with type 2 diabetes mellitus ([place]) Well controlled on current regimen. DASH diet and exercise encouraged. Labs pending.  -     CBC with Differential/Platelet -     CMP14+EGFR -     Microalbumin / creatinine urine ratio -     Lipid panel -     Thyroid Panel With TSH  Hyperlipidemia associated with type 2 diabetes  mellitus ([place]) On statin therapy. Labs pending. DASH diet and exercise encouraged.  -     CMP14+EGFR -     Lipid panel  Morbid obesity ([place]) Diet and exercise encouraged. Ozempic added to diabetic regimen which may be beneficial for weight loss.  -     CBC with Differential/Platelet -     CMP14+EGFR -     Lipid panel -     Thyroid Panel With TSH -     Bayer DCA Hb A1c Waived  Depression, recurrent (H[facility]) Worsening symptoms over last several months. Will increase effexor to 150 mg. Labs pending. No SI or HI. Pt aware to report any new, worsening, or persistent symptoms.  -     Thyroid Panel With TSH -     venlafaxine XR (EFFEXOR XR) 150 MG 24 hr capsule; Take 1 capsule (150 mg total) by mouth daily with breakfast.  S/P CABG x 1 Coronary artery disease involving native coronary artery of native heart without angina pectoris No anginal symptoms. Followed by cardiology on a regular basis.   MS Currently on Ocrevus. Followed by neurology on a regular basis.    Continue all other maintenance medications. Prior health maintenance records have been requested. Pt aware to make appointment for CPE with PAP.   Follow up plan: Return in about 3 months (around 03/16/2021), or if symptoms worsen or fail to improve, for DM.   Continue healthy lifestyle choices, including diet (rich in fruits, vegetables, and lean proteins, and low in salt and simple carbohydrates) and exercise (at least 30 minutes of moderate physical activity daily).  Educational handout given for Depression, DM  The above assessment and management plan was discussed with the patient. The patient verbalized understanding of and has agreed to the management plan. Patient is aware to call the clinic if they develop any new symptoms or if symptoms persist or worsen. Patient is aware when to return to the clinic for a follow-up visit. Patient educated on when it is appropriate to go to the emergency department.   Monia Pouch,  FNP-C [facility] [phone]

## 2020-12-15 NOTE — Patient Instructions (Signed)
If your symptoms worsen or you have thoughts of suicide/homicide, PLEASE SEEK IMMEDIATE MEDICAL ATTENTION.  You may always call the National Suicide Hotline.  This is available 24 hours a day, 7 days a week.  Their number is: 313-033-2712  Taking the medicine as directed and not missing any doses is one of the best things you can do to treat your depression.  Here are some things to keep in mind:  Side effects (stomach upset, some increased anxiety) may happen before you notice a benefit.  These side effects typically go away over time. Changes to your dose of medicine or a change in medication all together is sometimes necessary Most people need to be on medication at least 12 months Many people will notice an improvement within two weeks but the full effect of the medication can take up to 4-6 weeks Stopping the medication when you start feeling better often results in a return of symptoms Never discontinue your medication without contacting a health care professional first.  Some medications require gradual discontinuation/ taper and can make you sick if you stop them abruptly.  If your symptoms worsen or you have thoughts of suicide/homicide, PLEASE SEEK IMMEDIATE MEDICAL ATTENTION.  You may always call:  National Suicide Hotline: 815-845-9737 [place]: 667-574-0001 Crisis Recovery in [place]: 769-839-3165  These are available 24 hours a day, 7 days a week. Continue to monitor your blood sugars as we discussed and record them. Bring the log to your next appointment.  Take your medications as directed.    Goal Blood glucose:    Fasting (before meals) = 80 to 130   Within 2 hours of eating = less than 180   Understanding your Hemoglobin A1c:     Diabetes Mellitus and Nutrition    I think that you would greatly benefit from seeing a nutritionist. If this is something you are interested in, please call Dr Jenne Campus at [phone] to schedule an  appointment.   When you have diabetes (diabetes mellitus), it is very important to have healthy eating habits because your blood sugar (glucose) levels are greatly affected by what you eat and drink. Eating healthy foods in the appropriate amounts, at about the same times every day, can help you: Control your blood glucose. Lower your risk of heart disease. Improve your blood pressure. Reach or maintain a healthy weight.  Every person with diabetes is different, and each person has different needs for a meal plan. Your health care provider may recommend that you work with a diet and nutrition specialist (dietitian) to make a meal plan that is best for you. Your meal plan may vary depending on factors such as: The calories you need. The medicines you take. Your weight. Your blood glucose, blood pressure, and cholesterol levels. Your activity level. Other health conditions you have, such as heart or kidney disease.  How do carbohydrates affect me? Carbohydrates affect your blood glucose level more than any other type of food. Eating carbohydrates naturally increases the amount of glucose in your blood. Carbohydrate counting is a method for keeping track of how many carbohydrates you eat. Counting carbohydrates is important to keep your blood glucose at a healthy level, especially if you use insulin or take certain oral diabetes medicines. It is important to know how many carbohydrates you can safely have in each meal. This is different for every person. Your dietitian can help you calculate how many carbohydrates you should have at each meal and for snack. Foods  that contain carbohydrates include: Bread, cereal, rice, pasta, and crackers. Potatoes and corn. Peas, beans, and lentils. Milk and yogurt. Fruit and juice. Desserts, such as cakes, cookies, ice cream, and candy.  How does alcohol affect me? Alcohol can cause a sudden decrease in blood glucose (hypoglycemia), especially if you use  insulin or take certain oral diabetes medicines. Hypoglycemia can be a life-threatening condition. Symptoms of hypoglycemia (sleepiness, dizziness, and confusion) are similar to symptoms of having too much alcohol. If your health care provider says that alcohol is safe for you, follow these guidelines: Limit alcohol intake to no more than 1 drink per day for nonpregnant women and 2 drinks per day for men. One drink equals 12 oz of beer, 5 oz of wine, or 1 oz of hard liquor. Do not drink on an empty stomach. Keep yourself hydrated with water, diet soda, or unsweetened iced tea. Keep in mind that regular soda, juice, and other mixers may contain a lot of sugar and must be counted as carbohydrates.  What are tips for following this plan?  Reading food labels Start by checking the serving size on the label. The amount of calories, carbohydrates, fats, and other nutrients listed on the label are based on one serving of the food. Many foods contain more than one serving per package. Check the total grams (g) of carbohydrates in one serving. You can calculate the number of servings of carbohydrates in one serving by dividing the total carbohydrates by 15. For example, if a food has 30 g of total carbohydrates, it would be equal to 2 servings of carbohydrates. Check the number of grams (g) of saturated and trans fats in one serving. Choose foods that have low or no amount of these fats. Check the number of milligrams (mg) of sodium in one serving. Most people should limit total sodium intake to less than 2,300 mg per day. Always check the nutrition information of foods labeled as "low-fat" or "nonfat". These foods may be higher in added sugar or refined carbohydrates and should be avoided. Talk to your dietitian to identify your daily goals for nutrients listed on the label.  Shopping Avoid buying canned, premade, or processed foods. These foods tend to be high in fat, sodium, and added sugar. Shop  around the outside edge of the grocery store. This includes fresh fruits and vegetables, bulk grains, fresh meats, and fresh dairy.  Cooking Use low-heat cooking methods, such as baking, instead of high-heat cooking methods like deep frying. Cook using healthy oils, such as olive, canola, or sunflower oil. Avoid cooking with butter, cream, or high-fat meats.  Meal planning Eat meals and snacks regularly, preferably at the same times every day. Avoid going long periods of time without eating. Eat foods high in fiber, such as fresh fruits, vegetables, beans, and whole grains. Talk to your dietitian about how many servings of carbohydrates you can eat at each meal. Eat 4-6 ounces of lean protein each day, such as lean meat, chicken, fish, eggs, or tofu. 1 ounce is equal to 1 ounce of meat, chicken, or fish, 1 egg, or 1/4 cup of tofu. Eat some foods each day that contain healthy fats, such as avocado, nuts, seeds, and fish.  Lifestyle  Check your blood glucose regularly. Exercise at least 30 minutes 5 or more days each week, or as told by your health care provider. Take medicines as told by your health care provider. Do not use any products that contain nicotine or tobacco, such as  cigarettes and e-cigarettes. If you need help quitting, ask your health care provider. Work with a Social worker or diabetes educator to identify strategies to manage stress and any emotional and social challenges.  What are some questions to ask my health care provider? Do I need to meet with a diabetes educator? Do I need to meet with a dietitian? What number can I call if I have questions? When are the best times to check my blood glucose?  Where to find more information: American Diabetes Association: diabetes.org/food-and-fitness/food Academy of Nutrition and Dietetics: PokerClues.dk [employer] of Diabetes and Digestive and Kidney Diseases (NIH):  ContactWire.be  Summary A healthy meal plan will help you control your blood glucose and maintain a healthy lifestyle. Working with a diet and nutrition specialist (dietitian) can help you make a meal plan that is best for you. Keep in mind that carbohydrates and alcohol have immediate effects on your blood glucose levels. It is important to count carbohydrates and to use alcohol carefully. This information is not intended to replace advice given to you by your health care provider. Make sure you discuss any questions you have with your health care provider. Document Released: 12/07/2004 Document Revised: 04/16/2016 Document Reviewed: 04/16/2016 Elsevier Interactive Patient Education  [employer].

## 2020-12-16 LAB — CMP14+EGFR
ALT: 54 IU/L — ABNORMAL HIGH (ref 0–32)
AST: 37 IU/L (ref 0–40)
Albumin/Globulin Ratio: 1.8 (ref 1.2–2.2)
Albumin: 4.5 g/dL (ref 3.8–4.9)
Alkaline Phosphatase: 78 IU/L (ref 44–121)
BUN/Creatinine Ratio: 13 (ref 9–23)
BUN: 11 mg/dL (ref 6–24)
Bilirubin Total: 0.2 mg/dL (ref 0.0–1.2)
CO2: 19 mmol/L — ABNORMAL LOW (ref 20–29)
Calcium: 9.2 mg/dL (ref 8.7–10.2)
Chloride: 101 mmol/L (ref 96–106)
Creatinine, Ser: 0.83 mg/dL (ref 0.57–1.00)
Globulin, Total: 2.5 g/dL (ref 1.5–4.5)
Glucose: 318 mg/dL — ABNORMAL HIGH (ref 65–99)
Potassium: 4.4 mmol/L (ref 3.5–5.2)
Sodium: 140 mmol/L (ref 134–144)
Total Protein: 7 g/dL (ref 6.0–8.5)
eGFR: 84 mL/min/{1.73_m2} (ref >59)

## 2020-12-16 LAB — CBC WITH DIFFERENTIAL/PLATELET
Basophils Absolute: 0.1 10*3/uL (ref 0.0–0.2)
Basos: 1 %
EOS (ABSOLUTE): 0.2 10*3/uL (ref 0.0–0.4)
Eos: 4 %
Hematocrit: 41.1 % (ref 34.0–46.6)
Hemoglobin: 14 g/dL (ref 11.1–15.9)
Immature Grans (Abs): 0 10*3/uL (ref 0.0–0.1)
Immature Granulocytes: 0 %
Lymphocytes Absolute: 2 10*3/uL (ref 0.7–3.1)
Lymphs: 33 %
MCH: 29.7 pg (ref 26.6–33.0)
MCHC: 34.1 g/dL (ref 31.5–35.7)
MCV: 87 fL (ref 79–97)
Monocytes Absolute: 0.6 10*3/uL (ref 0.1–0.9)
Monocytes: 10 %
Neutrophils Absolute: 3.2 10*3/uL (ref 1.4–7.0)
Neutrophils: 52 %
Platelets: 371 10*3/uL (ref 150–450)
RBC: 4.72 x10E6/uL (ref 3.77–5.28)
RDW: 12 % (ref 11.7–15.4)
WBC: 6.1 10*3/uL (ref 3.4–10.8)

## 2020-12-16 LAB — THYROID PANEL WITH TSH
Free Thyroxine Index: 1.7 (ref 1.2–4.9)
T3 Uptake Ratio: 26 % (ref 24–39)
T4, Total: 6.6 ug/dL (ref 4.5–12.0)
TSH: 0.674 u[IU]/mL (ref 0.450–4.500)

## 2020-12-16 LAB — LIPID PANEL
Chol/HDL Ratio: 7.9 ratio — ABNORMAL HIGH (ref 0.0–4.4)
Cholesterol, Total: 245 mg/dL — ABNORMAL HIGH (ref 100–199)
HDL: 31 mg/dL — ABNORMAL LOW (ref >39)
LDL Chol Calc (NIH): 161 mg/dL — ABNORMAL HIGH (ref 0–99)
Triglycerides: 279 mg/dL — ABNORMAL HIGH (ref 0–149)
VLDL Cholesterol Cal: 53 mg/dL — ABNORMAL HIGH (ref 5–40)

## 2020-12-16 LAB — MICROALBUMIN / CREATININE URINE RATIO
Creatinine, Urine: 75.6 mg/dL
Microalb/Creat Ratio: 56 mg/g creat — ABNORMAL HIGH (ref 0–29)
Microalbumin, Urine: 42.5 ug/mL

## 2020-12-26 NOTE — Telephone Encounter (Signed)
Alexis case Freight forwarder with [employer] called with the phone and fax number to [facility] [phone]) and [phone]).

## 2020-12-27 NOTE — Telephone Encounter (Signed)
Message provided to [facility].

## 2020-12-29 ENCOUNTER — Encounter: Payer: Self-pay | Admitting: *Deleted

## 2021-01-02 ENCOUNTER — Telehealth: Payer: Self-pay | Admitting: Neurology

## 2021-01-02 NOTE — Telephone Encounter (Signed)
Taia from [facility] called needing prescription sent for the pt's ocrelizumab (OCREVUS) 300 MG/10ML injection sent to them.

## 2021-01-03 MED ORDER — OCRELIZUMAB 300 MG/10ML IV SOLN
600.0000 mg | INTRAVENOUS | 0 refills | Status: DC
Start: 2021-01-03 — End: 2024-01-02

## 2021-01-03 NOTE — Telephone Encounter (Signed)
Ocrevus RX sent to [facility].

## 2021-03-16 ENCOUNTER — Ambulatory Visit: Payer: Managed Care, Other (non HMO) | Admitting: Family Medicine

## 2021-03-23 ENCOUNTER — Encounter: Payer: Self-pay | Admitting: Family Medicine

## 2021-03-23 ENCOUNTER — Other Ambulatory Visit: Payer: Self-pay

## 2021-03-23 ENCOUNTER — Other Ambulatory Visit: Payer: Self-pay | Admitting: Family Medicine

## 2021-03-23 ENCOUNTER — Ambulatory Visit (INDEPENDENT_AMBULATORY_CARE_PROVIDER_SITE_OTHER): Payer: Managed Care, Other (non HMO) | Admitting: Family Medicine

## 2021-03-23 VITALS — BP 132/83 | HR 71 | Temp 98.4°F | Ht 63.0 in | Wt 176.0 lb

## 2021-03-23 DIAGNOSIS — R5383 Other fatigue: Secondary | ICD-10-CM

## 2021-03-23 DIAGNOSIS — E1165 Type 2 diabetes mellitus with hyperglycemia: Secondary | ICD-10-CM

## 2021-03-23 DIAGNOSIS — E1169 Type 2 diabetes mellitus with other specified complication: Secondary | ICD-10-CM | POA: Diagnosis not present

## 2021-03-23 LAB — BAYER DCA HB A1C WAIVED: HB A1C (BAYER DCA - WAIVED): 6.6 % — ABNORMAL HIGH (ref 4.8–5.6)

## 2021-03-23 NOTE — Patient Instructions (Signed)

## 2021-03-23 NOTE — Progress Notes (Signed)
Subjective:  Patient ID: Kaylee Bryant, female    DOB: [birth year], 55 y.o.   MRN: [id number]  Patient Care Team: Baruch Gouty, FNP as PCP - General (Family Medicine)   Chief Complaint:  3 month follow up (Diabetes check/Fatigue)   HPI: Kaylee Bryant is a 55 y.o. female presenting on 03/23/2021 for 3 month follow up (Diabetes check/Fatigue)   Pt presents today for follow up of type 2 diabetes. She has been taking metformin and glipizide as prescribed without associated side effects.  She has modified her diet. She reports slight fatigue over the last few weeks, no other symptoms. She denies any polyuria, polydipsia, or polyphagia. Denies increased MS symptoms.    Relevant past medical, surgical, family, and social history reviewed and updated as indicated.  Allergies and medications reviewed and updated. Data reviewed: Chart in Epic.   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   LIMA to LAD off pump    Past Surgical History:  Procedure Laterality Date   CARDIAC CATHETERIZATION N/A 04/05/2015   Procedure: Left Heart Cath and Coronary Angiography;  Surgeon: Troy Sine, MD;  Location: [facility];  Service: Cardiovascular;  Laterality: N/A;   CERVICAL CONE BIOPSY  2004   CESAREAN SECTION     1996, 2006   CHOLECYSTECTOMY     1988   COLPOSCOPY  2004   CORONARY ARTERY BYPASS GRAFT N/A 04/07/2015   Procedure: CORONARY ARTERY BYPASS GRAFTING (CABG), OFF PUMP, TIMES ONE, USING LEFT INTERNAL MAMMARY ARTERY;  Surgeon: Rexene Alberts, MD;  Location: [facility];   Service: Open Heart Surgery;  Laterality: N/A;  LIMA to LAD   TEE WITHOUT CARDIOVERSION N/A 04/07/2015   Procedure: TRANSESOPHAGEAL ECHOCARDIOGRAM (TEE);  Surgeon: Rexene Alberts, MD;  Location: [facility];  Service: Open Heart Surgery;  Laterality: N/A;    Social History   Socioeconomic History   Marital status: Married    Spouse name: Not on file   Number of children: 4   Years of education: college   Highest education level: Not on file  Occupational History   Occupation: Programmer, multimedia: [employer]  Tobacco Use   Smoking status: Former    Packs/day: 1.50    Years: 15.00    Pack years: 22.50    Types: Cigarettes    Quit date: 02/03/2004    Years since quitting: 17.1   Smokeless tobacco: Never  Substance and Sexual Activity   Alcohol use: Yes    Alcohol/week: 1.0 standard drink    Types: 1 Glasses of wine per week   Drug use: No   Sexual activity: Yes    Partners: Male    Birth control/protection: Surgical    Comment: vasectomy  Other Topics Concern   Not on file  Social History Narrative   Patient lives at home with her husband and children. Patient works for the [employer] in [place]. College education.    Social Determinants  of Health   Financial Resource Strain: Not on file  Food Insecurity: Not on file  Transportation Needs: Not on file  Physical Activity: Not on file  Stress: Not on file  Social Connections: Not on file  Intimate Partner Violence: Not on file    Outpatient Encounter Medications as of 03/23/2021  Medication Sig   acetaminophen (TYLENOL) 500 MG tablet Take 1,000 mg by mouth every 6 (six) hours as needed for mild pain.   aspirin EC 81 MG tablet Take 81 mg by mouth daily.   atorvastatin (LIPITOR) 80 MG tablet TAKE 1 TABLET BY MOUTH DAILY AT 6 PM.   baclofen (LIORESAL) 10 MG tablet TAKE 2 TABLETS (20 MG TOTAL) BY MOUTH 3 (THREE) TIMES DAILY.   Continuous Blood Gluc Receiver (FREESTYLE LIBRE 2 READER) DEVI 1 Device by Does not apply  route 4 (four) times daily.   Continuous Blood Gluc Sensor (FREESTYLE LIBRE 2 SENSOR) MISC 1 Device by Does not apply route 4 (four) times daily.   gabapentin (NEURONTIN) 300 MG capsule Take 2 capsules (600 mg total) by mouth 3 (three) times daily.   ibuprofen (ADVIL) 800 MG tablet Take 800 mg by mouth every 8 (eight) hours as needed.   lisinopril (ZESTRIL) 10 MG tablet TAKE 1 TABLET (10 MG TOTAL) BY MOUTH IN THE MORNING AND AT BEDTIME.   metFORMIN (GLUCOPHAGE-XR) 500 MG 24 hr tablet Take 1,000 mg by mouth 2 (two) times daily.    metoprolol tartrate (LOPRESSOR) 25 MG tablet TAKE 1 TABLET BY MOUTH TWICE A DAY   ocrelizumab (OCREVUS) 300 MG/10ML injection Inject 20 mLs (600 mg total) into the vein every 6 (six) months.   tiZANidine (ZANAFLEX) 2 MG tablet Take 1 tablet (2 mg total) by mouth 3 (three) times daily.   [DISCONTINUED] glipiZIDE (GLUCOTROL XL) 10 MG 24 hr tablet Take 10 mg by mouth daily.   docusate sodium (COLACE) 100 MG capsule    venlafaxine XR (EFFEXOR XR) 150 MG 24 hr capsule Take 1 capsule (150 mg total) by mouth daily with breakfast.   No facility-administered encounter medications on file as of 03/23/2021.    No Known Allergies  Review of Systems  Constitutional:  Positive for fatigue. Negative for activity change, appetite change, chills and fever.  HENT: Negative.    Eyes: Negative.  Negative for photophobia and visual disturbance.  Respiratory:  Negative for cough, chest tightness and shortness of breath.   Cardiovascular:  Negative for chest pain, palpitations and leg swelling.  Gastrointestinal:  Negative for abdominal pain, blood in stool, constipation, diarrhea, nausea and vomiting.  Endocrine: Negative.  Negative for polydipsia, polyphagia and polyuria.  Genitourinary:  Negative for decreased urine volume, difficulty urinating, dysuria, frequency and urgency.  Musculoskeletal:  Negative for arthralgias and myalgias.  Skin: Negative.   Allergic/Immunologic:  Negative.   Neurological:  Negative for dizziness and headaches.  Hematological: Negative.   Psychiatric/Behavioral:  Negative for confusion, hallucinations, sleep disturbance and suicidal ideas.   All other systems reviewed and are negative.      Objective:  BP 132/83    Pulse 71    Temp 98.4 F (36.9 C)    Ht 5\' 3"  (1.6 m)    Wt 176 lb (79.8 kg)    LMP 10/03/2014 Comment: last period was approx 6 months ago, "i don't have it anymore"   SpO2 96%    BMI 31.18 kg/m    Wt Readings from Last 3 Encounters:  03/23/21 176 lb (79.8 kg)  12/15/20 195  lb (88.5 kg)  08/09/20 197 lb (89.4 kg)    Physical Exam Vitals and nursing note reviewed.  Constitutional:      Appearance: Normal appearance. She is obese.  HENT:     Head: Normocephalic and atraumatic.     Mouth/Throat:     Mouth: Mucous membranes are moist.  Eyes:     Pupils: Pupils are equal, round, and reactive to light.  Cardiovascular:     Rate and Rhythm: Normal rate and regular rhythm.     Heart sounds: Normal heart sounds. No murmur heard.   No friction rub. No gallop.  Pulmonary:     Effort: Pulmonary effort is normal.     Breath sounds: Normal breath sounds.  Skin:    General: Skin is warm and dry.     Capillary Refill: Capillary refill takes less than 2 seconds.  Neurological:     General: No focal deficit present.     Mental Status: She is alert and oriented to person, place, and time. Mental status is at baseline.     Gait: Gait abnormal (slight weakness of RLE due to MS).  Psychiatric:        Mood and Affect: Mood normal.        Behavior: Behavior normal.        Thought Content: Thought content normal.        Judgment: Judgment normal.    Results for orders placed or performed in visit on 12/29/20  HM COLONOSCOPY  Result Value Ref Range   HM Colonoscopy Patient Reported See Report (in chart), Patient Reported       Pertinent labs & imaging results that were available during my care of the patient were  reviewed by me and considered in my medical decision making.  Assessment & Plan:  Carrissa was seen today for 3 month follow up.  Diagnoses and all orders for this visit:  Type 2 diabetes mellitus with other specified complication, without long-term current use of insulin (HCC) A1C 6.6 today, great job. Will stop glipizide and recheck A1C in 3 months. If A1C remains controlled, will not restart glipizide. Diet and exercise encouraged.  -     Bayer DCA Hb A1c Waived  Fatigue  Likely due to lower blood sugars over the last few weeks. Pt aware to monitor blood sugars and report any persistent high or low readings. If fatigue persists, pt aware to follow up.    Continue all other maintenance medications.  Follow up plan: Return in about 3 months (around 06/21/2021), or if symptoms worsen or fail to improve, for DM.   Continue healthy lifestyle choices, including diet (rich in fruits, vegetables, and lean proteins, and low in salt and simple carbohydrates) and exercise (at least 30 minutes of moderate physical activity daily).  Educational handout given for DM  The above assessment and management plan was discussed with the patient. The patient verbalized understanding of and has agreed to the management plan. Patient is aware to call the clinic if they develop any new symptoms or if symptoms persist or worsen. Patient is aware when to return to the clinic for a follow-up visit. Patient educated on when it is appropriate to go to the emergency department.   Monia Pouch, FNP-C [facility] [phone]

## 2021-04-03 ENCOUNTER — Encounter: Payer: Self-pay | Admitting: Family Medicine

## 2021-04-03 ENCOUNTER — Ambulatory Visit: Payer: Managed Care, Other (non HMO) | Admitting: Family Medicine

## 2021-04-03 NOTE — Progress Notes (Deleted)
No chief complaint on file.    HISTORY OF PRESENT ILLNESS:  04/03/21 ALL:  Kaylee Bryant is a 56 y.o. female here today for follow up for RRMS. Patient of Dr Krista Blue and Frann Rider. She was last seen 04/2020 by Dr Krista Blue. She continues Ocrevus infusions and tolerating well. MRI brian stable 05/2020.    Back pain . MRI lumbar spine 05/2020 showed L4-5 facet hypertrophy, right lateral bone spurring and severe spinal stenosis and L5-S1 disc bulging, facet hypertrophy, moderate right and severe left foraminal stenosis. NCS/EMG showed bilateral L5-S1 lumbar radiculopathy.  She had NS eval with Dr Christella Noa 06/06/20. Decom surg? She continues gabapentin 600mg  TID. Tizanidine 2mg  TID  baclofen 20mg  TID?  Mood is stable on venlafaxine 150mg  daily managed by PCP. Sleep?    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, with short staffed, she has to work extra hours, does complains of fatigue, on polypharmacy treatment, including Effexor XR 75 mg daily, baclofen 10 mg 2 tablets 3 times a day, tizanidine 2 mg 3 times a day, also on gabapentin 600 mg 3 times a day   Recently she has intermittent sharp radiating pain at right face from the right forehead to break in the eye corner, to below cheek area, gabapentin provide limited help,   Last MRI of the brain was in November 2020, personally reviewed the films, MRI of the brain multiple round and oval periventricular, subcortical, pontine chronic demyelinating plaques, no acute abnormality, no change from MRI compared with January 2019, MRI of cervical and thoracic spine showed chronic demyelinating plaque at C2, C3, 4, and C6- C7, T6-7, T9, T10, T12 and L1, no enhancement   REVIEW OF SYSTEMS: Out of a complete 14  system review of symptoms, the patient complains only of the following symptoms, and all other reviewed systems are negative.   ALLERGIES: No Known Allergies   HOME MEDICATIONS: Outpatient Medications Prior to Visit  Medication Sig Dispense Refill   acetaminophen (TYLENOL) 500 MG tablet Take 1,000 mg by mouth every 6 (six) hours as needed for mild pain.     aspirin EC 81 MG tablet Take 81 mg by mouth daily.     atorvastatin (LIPITOR) 80 MG tablet TAKE 1 TABLET BY MOUTH DAILY AT 6 PM. 30 tablet 12   baclofen (LIORESAL) 10 MG tablet TAKE 2 TABLETS (20 MG TOTAL) BY MOUTH 3 (THREE) TIMES DAILY. 540 tablet 3   Continuous Blood Gluc Receiver (FREESTYLE LIBRE 2 READER) DEVI 1 Device by Does not apply route 4 (four) times daily. 1 each 0   Continuous Blood Gluc Sensor (FREESTYLE LIBRE 2 SENSOR) MISC 1 Device by Does not apply route 4 (four) times daily. Dx E11.65 2 each 6   docusate sodium (COLACE) 100 MG capsule      gabapentin (NEURONTIN) 300 MG capsule Take 2 capsules (600 mg total) by mouth 3 (three) times daily. 540 capsule 11   ibuprofen (ADVIL) 800 MG tablet Take 800 mg by mouth every 8 (eight) hours as needed.     lisinopril (ZESTRIL) 10 MG tablet TAKE 1 TABLET (10 MG TOTAL) BY MOUTH IN THE MORNING AND AT BEDTIME. 60 tablet 12   metFORMIN (GLUCOPHAGE-XR) 500 MG 24 hr tablet Take 1,000 mg by mouth 2 (  two) times daily.      metoprolol tartrate (LOPRESSOR) 25 MG tablet TAKE 1 TABLET BY MOUTH TWICE A DAY 60 tablet 12   ocrelizumab (OCREVUS) 300 MG/10ML injection Inject 20 mLs (600 mg total) into the vein every 6 (six) months. 20 mL 0   tiZANidine (ZANAFLEX) 2 MG tablet Take 1 tablet (2 mg total) by mouth 3 (three) times daily. 270 tablet 3   venlafaxine XR (EFFEXOR XR) 150 MG 24 hr capsule Take 1 capsule (150 mg total) by mouth daily with breakfast. 90 capsule 1   No facility-administered medications prior to visit.     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   LIMA to LAD off pump     PAST SURGICAL HISTORY: Past Surgical History:  Procedure Laterality Date   CARDIAC CATHETERIZATION N/A 04/05/2015   Procedure: Left Heart Cath and Coronary Angiography;  Surgeon: Troy Sine, MD;  Location: [facility];  Service: Cardiovascular;  Laterality: N/A;   CERVICAL CONE BIOPSY  2004   CESAREAN SECTION     1996, 2006   CHOLECYSTECTOMY     1988   COLPOSCOPY  2004   CORONARY ARTERY BYPASS GRAFT N/A 04/07/2015   Procedure: CORONARY ARTERY BYPASS GRAFTING (CABG), OFF PUMP, TIMES ONE, USING LEFT INTERNAL MAMMARY ARTERY;  Surgeon: Rexene Alberts, MD;  Location: [facility];  Service: Open Heart Surgery;  Laterality: N/A;  LIMA to LAD   TEE WITHOUT CARDIOVERSION N/A 04/07/2015   Procedure: TRANSESOPHAGEAL ECHOCARDIOGRAM (TEE);  Surgeon: Rexene Alberts, MD;  Location: [facility];  Service: Open Heart Surgery;  Laterality: N/A;     FAMILY HISTORY: Family History  Problem Relation Age of Onset   Diabetes Mother    Hypertension Mother    Hypertension Father    Atrial fibrillation Father    Diabetes Sister    Breast cancer Maternal Aunt    Arrhythmia Sister    Heart attack Maternal Grandfather    Stroke Neg Hx      SOCIAL HISTORY: Social History   Socioeconomic History   Marital status: Married    Spouse name: Not on file   Number of children: 4   Years of education: college   Highest education level: Not on file  Occupational History   Occupation: Programmer, multimedia: [employer]  Tobacco Use   Smoking status: Former     Packs/day: 1.50    Years: 15.00    Pack years: 22.50    Types: Cigarettes    Quit date: 02/03/2004    Years since quitting: 17.1   Smokeless tobacco: Never  Substance and Sexual Activity   Alcohol use: Yes    Alcohol/week: 1.0 standard drink    Types: 1 Glasses of wine per week   Drug use: No   Sexual activity: Yes    Partners: Male    Birth control/protection: Surgical    Comment: vasectomy  Other Topics Concern   Not on  file  Social History Narrative   Patient lives at home with her husband and children. Patient works for the [employer] in [place]. College education.    Social Determinants of Health   Financial Resource Strain: Not on file  Food Insecurity: Not on file  Transportation Needs: Not on file  Physical Activity: Not on file  Stress: Not on file  Social Connections: Not on file  Intimate Partner Violence: Not on file     PHYSICAL EXAM  There were no vitals filed for this visit. There is no height or weight on file to calculate BMI.  Generalized: Well developed, in no acute distress  Cardiology: normal rate and rhythm, no murmur auscultated  Respiratory: clear to auscultation bilaterally    Neurological examination  Mentation: Alert oriented to time, place, history taking. Follows all commands speech and language fluent Cranial nerve II-XII: Pupils were equal round reactive to light. Extraocular movements were full, visual field were full on confrontational test. Facial sensation and strength were normal. Uvula tongue midline. Head turning and shoulder shrug  were normal and symmetric. Motor: The motor testing reveals 5 over 5 strength of all 4 extremities. Good symmetric motor tone is noted throughout.  Sensory: Sensory testing is intact to soft touch on all 4 extremities. No evidence of extinction is noted.  Coordination: Cerebellar testing reveals good finger-nose-finger and heel-to-shin bilaterally.  Gait and station: Gait is normal. Tandem gait is  normal. Romberg is negative. No drift is seen.  Reflexes: Deep tendon reflexes are symmetric and normal bilaterally.    DIAGNOSTIC DATA (LABS, IMAGING, TESTING) - I reviewed patient records, labs, notes, testing and imaging myself where available.  Lab Results  Component Value Date   WBC 6.1 12/15/2020   HGB 14.0 12/15/2020   HCT 41.1 12/15/2020   MCV 87 12/15/2020   PLT 371 12/15/2020      Component Value Date/Time   NA 140 12/15/2020 1143   K 4.4 12/15/2020 1143   CL 101 12/15/2020 1143   CO2 19 (L) 12/15/2020 1143   GLUCOSE 318 (H) 12/15/2020 1143   GLUCOSE 145 (H) 04/11/2015 0537   BUN 11 12/15/2020 1143   CREATININE 0.83 12/15/2020 1143   CREATININE 0.65 06/02/2013 1640   CALCIUM 9.2 12/15/2020 1143   PROT 7.0 12/15/2020 1143   ALBUMIN 4.5 12/15/2020 1143   AST 37 12/15/2020 1143   ALT 54 (H) 12/15/2020 1143   ALKPHOS 78 12/15/2020 1143   BILITOT 0.2 12/15/2020 1143   GFRNONAA 87 05/02/2020 0809   GFRAA 100 05/02/2020 0809   Lab Results  Component Value Date   CHOL 245 (H) 12/15/2020   HDL 31 (L) 12/15/2020   LDLCALC 161 (H) 12/15/2020   TRIG 279 (H) 12/15/2020   CHOLHDL 7.9 (H) 12/15/2020   Lab Results  Component Value Date   HGBA1C 6.6 (H) 03/23/2021   Lab Results  Component Value Date   VITAMINB12 498 08/09/2015   Lab Results  Component Value Date   TSH 0.674 12/15/2020    No flowsheet data found.   No flowsheet data found.   ASSESSMENT AND PLAN  56 y.o. year old female  has a past medical history of Abnormal Pap smear (2004), Coronary artery disease involving native coronary artery with unstable angina pectoris ([place]) (04/05/2015), DDD (degenerative disc disease), lumbar, DM (diabetes mellitus), gestational (2006), Fatigue, Heart attack ([place]), IBS (irritable bowel syndrome), Multiple sclerosis ([place]), Obesity, and S/P CABG x 1 (04/07/2015). here with    No diagnosis found.  No orders of the defined types were placed in this  encounter.    No orders of the defined types were placed in this encounter.     Debbora Presto, MSN, FNP-C 04/03/2021, 12:31 PM  [facility] [address] [place], [place] [address] [phone]

## 2021-04-03 NOTE — Patient Instructions (Incomplete)
Below is our plan:  We will continue Ocrevus infusions twice yearly. I will update labs. Continue gabapentin 600mg  TID, baclofen 20mg  TID and tizanidine 2mg  TID.  Please make sure you are staying well hydrated. I recommend 50-60 ounces daily. Well balanced diet and regular exercise encouraged. Consistent sleep schedule with 6-8 hours recommended.   Please continue follow up with care team as directed.   Follow up with Dr Krista Blue in 6 months   You may receive a survey regarding today's visit. I encourage you to leave honest feed back as I do use this information to improve patient care. Thank you for seeing me today!

## 2021-04-09 ENCOUNTER — Other Ambulatory Visit: Payer: Self-pay | Admitting: Neurology

## 2021-05-07 ENCOUNTER — Other Ambulatory Visit: Payer: Self-pay | Admitting: Neurology

## 2021-05-08 NOTE — Telephone Encounter (Signed)
Rx refilled. Patient needs appointment for further refills. 

## 2021-05-31 ENCOUNTER — Other Ambulatory Visit: Payer: Self-pay | Admitting: Neurology

## 2021-06-03 ENCOUNTER — Other Ambulatory Visit: Payer: Self-pay | Admitting: Family Medicine

## 2021-06-03 DIAGNOSIS — F339 Major depressive disorder, recurrent, unspecified: Secondary | ICD-10-CM

## 2021-06-07 ENCOUNTER — Telehealth: Payer: Self-pay | Admitting: Neurology

## 2021-06-07 NOTE — Telephone Encounter (Signed)
LVM and sent mychart message- f/u needed asap per Lianne in infusion. ?

## 2021-06-22 ENCOUNTER — Ambulatory Visit: Payer: Managed Care, Other (non HMO)

## 2021-06-22 ENCOUNTER — Ambulatory Visit: Payer: Managed Care, Other (non HMO) | Admitting: Family Medicine

## 2021-06-27 ENCOUNTER — Other Ambulatory Visit: Payer: Self-pay | Admitting: Family Medicine

## 2021-06-27 DIAGNOSIS — F339 Major depressive disorder, recurrent, unspecified: Secondary | ICD-10-CM

## 2021-07-13 ENCOUNTER — Encounter: Payer: Self-pay | Admitting: Family Medicine

## 2021-07-13 ENCOUNTER — Ambulatory Visit (INDEPENDENT_AMBULATORY_CARE_PROVIDER_SITE_OTHER): Payer: Managed Care, Other (non HMO) | Admitting: Family Medicine

## 2021-07-13 VITALS — BP 115/75 | HR 71 | Temp 98.1°F | Ht 63.0 in | Wt 172.0 lb

## 2021-07-13 DIAGNOSIS — I152 Hypertension secondary to endocrine disorders: Secondary | ICD-10-CM

## 2021-07-13 DIAGNOSIS — F339 Major depressive disorder, recurrent, unspecified: Secondary | ICD-10-CM

## 2021-07-13 DIAGNOSIS — E119 Type 2 diabetes mellitus without complications: Secondary | ICD-10-CM | POA: Insufficient documentation

## 2021-07-13 DIAGNOSIS — E1159 Type 2 diabetes mellitus with other circulatory complications: Secondary | ICD-10-CM | POA: Diagnosis not present

## 2021-07-13 DIAGNOSIS — E1169 Type 2 diabetes mellitus with other specified complication: Secondary | ICD-10-CM

## 2021-07-13 DIAGNOSIS — E785 Hyperlipidemia, unspecified: Secondary | ICD-10-CM

## 2021-07-13 LAB — BAYER DCA HB A1C WAIVED: HB A1C (BAYER DCA - WAIVED): 7.2 % — ABNORMAL HIGH (ref 4.8–5.6)

## 2021-07-13 MED ORDER — SEMAGLUTIDE (1 MG/DOSE) 4 MG/3ML ~~LOC~~ SOPN: 1.0000 mg | PEN_INJECTOR | SUBCUTANEOUS | 3 refills | Status: DC

## 2021-07-13 MED ORDER — METFORMIN HCL 500 MG PO TABS: 500.0000 mg | ORAL_TABLET | Freq: Two times a day (BID) | ORAL | 3 refills | Status: DC

## 2021-07-13 MED ORDER — VENLAFAXINE HCL ER 75 MG PO CP24: 75.0000 mg | ORAL_CAPSULE | Freq: Every day | ORAL | 3 refills | Status: DC

## 2021-07-13 MED ORDER — VENLAFAXINE HCL ER 150 MG PO CP24: 150.0000 mg | ORAL_CAPSULE | Freq: Every day | ORAL | 3 refills | Status: DC

## 2021-07-13 NOTE — Progress Notes (Signed)
?  ? ?Subjective:  ?Patient ID: Kaylee Bryant, female    DOB: 16-Jun-1965, 56 y.o.   MRN: [id number] ? ?Patient Care Team: ?Baruch Gouty, FNP as PCP - General (Family Medicine)  ? ?Chief Complaint:  Diabetes (3 month follow up) and Medical Management of Chronic Issues ? ? ?HPI: ?Kaylee Bryant is a 56 y.o. female presenting on 07/13/2021 for Diabetes (3 month follow up) and Medical Management of Chronic Issues ? ? ?1. Type 2 diabetes mellitus with other specified complication, without long-term current use of insulin ([place]) ?Has been taking Metformin and Ozempic 0.5 mg as prescribed. States she has a decreased appetite during the day but has noticed an increased appetite in the evenings. States her blood sugars have been better controlled. She denies polyuria, polyphagia, or polydipsia. She is due for an eye exam and is working on getting this completed. On ASA, statin and ACEi.  ? ?2. Hypertension associated with type 2 diabetes mellitus ([place]) ?Well controlled at home. Compliant with medications without associated side effects. No chest pain, shortness of breath, palpitations, headache, leg swelling, or syncope.  ? ?3. Hyperlipidemia associated with type 2 diabetes mellitus ([place]) ?On statin therapy and tolerating well. She does try to follow a healthy diet. She does not have an exercise routine.  ? ?4. Morbid obesity ([place]) ?She does try to follow a healthy diet. She does not have an exercise routine. Ozempic has been beneficial.  ? ?5. Depression, recurrent ([place]) ?Feels her depressive symptoms have increased some over the last several months. Feels this may be due to family stressors. She has noticed an increase in binge eating in the afternoon. No SI or HI.  ? ?  07/13/2021  ?  8:02 AM 03/23/2021  ?  8:02 AM 12/15/2020  ? 11:03 AM 05/10/2015  ? 10:43 AM  ?Depression screen PHQ 2/9  ?Decreased Interest 1 0 3 0  ?Down, Depressed, Hopeless 0 1 2 0  ?PHQ - 2 Score '1 1 5 '$ 0  ?Altered sleeping '2 2 1 2  '$ ?Tired, decreased energy 2  0 3 2  ?Change in appetite 3 0 3 0  ?Feeling bad or failure about yourself  0 0 1 0  ?Trouble concentrating 2 0 2 1  ?Moving slowly or fidgety/restless 0 0 0 0  ?Suicidal thoughts 0 0 0 0  ?PHQ-9 Score '10 3 15 5  '$ ?Difficult doing work/chores  Somewhat difficult Somewhat difficult Somewhat difficult  ? ? ?  07/13/2021  ?  8:03 AM 03/23/2021  ?  8:02 AM 12/15/2020  ? 11:04 AM  ?GAD 7 : Generalized Anxiety Score  ?Nervous, Anxious, on Edge 0 0 1  ?Control/stop worrying 0 0 0  ?Worry too much - different things 0 0 0  ?Trouble relaxing 2 0 1  ?Restless 0 0 0  ?Easily annoyed or irritable 0 0 0  ?Afraid - awful might happen 0 0 0  ?Total GAD 7 Score 2 0 2  ?Anxiety Difficulty Not difficult at all Not difficult at all Somewhat difficult  ? ? ? ?  ? ? ?Relevant past medical, surgical, family, and social history reviewed and updated as indicated.  ?Allergies and medications reviewed and updated. Data reviewed: Chart in Epic. ? ? ?Past Medical History:  ?Diagnosis Date  ? Abnormal Pap smear 2004  ? HPV, cone biopsy  ? Coronary artery disease involving native coronary artery with unstable angina pectoris ([place]) 04/05/2015  ? DDD (degenerative disc disease), lumbar   ?  DM (diabetes mellitus), gestational 2006  ? insulin at the time  ? Fatigue   ? Heart attack ([place])   ? IBS (irritable bowel syndrome)   ? with chronic constipation  ? Multiple sclerosis ([place])   ? Obesity   ? 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 LIMA to LAD off pump  ? ? ?Past Surgical History:  ?Procedure Laterality Date  ? CARDIAC CATHETERIZATION N/A 04/05/2015  ? Procedure: Left Heart Cath and Coronary Angiography;  Surgeon: Troy Sine, MD;  Location: [facility];  Service: Cardiovascular;  Laterality: N/A;  ? CERVICAL CONE BIOPSY  2004  ? CESAREAN SECTION    ? 1996, 2006  ? CHOLECYSTECTOMY    ? 1988  ? COLPOSCOPY  2004  ? CORONARY ARTERY BYPASS GRAFT N/A 04/07/2015  ? Procedure: CORONARY ARTERY BYPASS GRAFTING (CABG), OFF PUMP, TIMES ONE, USING LEFT INTERNAL MAMMARY  ARTERY;  Surgeon: Rexene Alberts, MD;  Location: [facility];  Service: Open Heart Surgery;  Laterality: N/A;  LIMA to LAD  ? TEE WITHOUT CARDIOVERSION N/A 04/07/2015  ? Procedure: TRANSESOPHAGEAL ECHOCARDIOGRAM (TEE);  Surgeon: Rexene Alberts, MD;  Location: [facility];  Service: Open Heart Surgery;  Laterality: N/A;  ? ? ?Social History  ? ?Socioeconomic History  ? Marital status: Married  ?  Spouse name: Not on file  ? Number of children: 4  ? Years of education: college  ? Highest education level: Not on file  ?Occupational History  ? Occupation: Therapist, sports  ?  Employer: [employer]  ?Tobacco Use  ? Smoking status: Former  ?  Packs/day: 1.50  ?  Years: 15.00  ?  Pack years: 22.50  ?  Types: Cigarettes  ?  Quit date: 02/03/2004  ?  Years since quitting: 17.4  ? Smokeless tobacco: Never  ?Substance and Sexual Activity  ? Alcohol use: Yes  ?  Alcohol/week: 1.0 standard drink  ?  Types: 1 Glasses of wine per week  ? Drug use: No  ? Sexual activity: Yes  ?  Partners: Male  ?  Birth control/protection: Surgical  ?  Comment: vasectomy  ?Other Topics Concern  ? Not on file  ?Social History Narrative  ? Patient lives at home with her husband and children. Patient works for the [employer] in [place]. College education.   ? ?Social Determinants of Health  ? ?Financial Resource Strain: Not on file  ?Food Insecurity: Not on file  ?Transportation Needs: Not on file  ?Physical Activity: Not on file  ?Stress: Not on file  ?Social Connections: Not on file  ?Intimate Partner Violence: Not on file  ? ? ?Outpatient Encounter Medications as of 07/13/2021  ?Medication Sig  ? acetaminophen (TYLENOL) 500 MG tablet Take 1,000 mg by mouth every 6 (six) hours as needed for mild pain.  ? aspirin EC 81 MG tablet Take 81 mg by mouth daily.  ? atorvastatin (LIPITOR) 80 MG tablet TAKE 1 TABLET BY MOUTH DAILY AT 6 PM.  ? baclofen (LIORESAL) 10 MG tablet TAKE 2 TABLETS BY MOUTH 3 TIMES DAILY.  ? Continuous Blood Gluc Receiver (FREESTYLE LIBRE 2 READER)  DEVI 1 Device by Does not apply route 4 (four) times daily.  ? Continuous Blood Gluc Sensor (FREESTYLE LIBRE 2 SENSOR) MISC 1 Device by Does not apply route 4 (four) times daily. Dx E11.65  ? docusate sodium (COLACE) 100 MG capsule   ? gabapentin (NEURONTIN) 300 MG capsule Take 2 capsules (600 mg total) by mouth  3 (three) times daily.  ? ibuprofen (ADVIL) 800 MG tablet Take 800 mg by mouth every 8 (eight) hours as needed.  ? lisinopril (ZESTRIL) 10 MG tablet TAKE 1 TABLET (10 MG TOTAL) BY MOUTH IN THE MORNING AND AT BEDTIME.  ? metFORMIN (GLUCOPHAGE) 500 MG tablet Take 1 tablet (500 mg total) by mouth 2 (two) times daily with a meal.  ? metoprolol tartrate (LOPRESSOR) 25 MG tablet TAKE 1 TABLET BY MOUTH TWICE A DAY  ? ocrelizumab (OCREVUS) 300 MG/10ML injection Inject 20 mLs (600 mg total) into the vein every 6 (six) months.  ? Semaglutide, 1 MG/DOSE, 4 MG/3ML SOPN Inject 1 mg as directed once a week.  ? tiZANidine (ZANAFLEX) 2 MG tablet TAKE 1 TABLET BY MOUTH 3 TIMES DAILY.  ? venlafaxine XR (EFFEXOR-XR) 150 MG 24 hr capsule Take 1 capsule (150 mg total) by mouth daily with breakfast.  ? venlafaxine XR (EFFEXOR-XR) 75 MG 24 hr capsule Take 1 capsule (75 mg total) by mouth daily with breakfast.  ? [DISCONTINUED] metFORMIN (GLUCOPHAGE-XR) 500 MG 24 hr tablet Take 1,000 mg by mouth 2 (two) times daily.   ? [DISCONTINUED] OZEMPIC, 0.25 OR 0.5 MG/DOSE, 2 MG/1.5ML SOPN PLEASE SEE ATTACHED FOR DETAILED DIRECTIONS  ? [DISCONTINUED] venlafaxine XR (EFFEXOR-XR) 150 MG 24 hr capsule TAKE 1 CAPSULE BY MOUTH DAILY WITH BREAKFAST.  ? ?No facility-administered encounter medications on file as of 07/13/2021.  ? ? ?No Known Allergies ? ?Review of Systems  ?Constitutional:  Positive for activity change and appetite change. Negative for chills, diaphoresis, fever and unexpected weight change.  ?Respiratory:  Negative for cough and shortness of breath.   ?Cardiovascular:  Negative for palpitations and leg swelling.  ?Gastrointestinal:   Negative for abdominal pain, constipation, diarrhea, nausea and vomiting.  ?Genitourinary:  Negative for decreased urine volume and difficulty urinating.  ?Musculoskeletal:  Positive for arthralgi

## 2021-07-13 NOTE — Patient Instructions (Addendum)
Continue to monitor your blood sugars as we discussed and record them. Bring the log to your next appointment.  Take your medications as directed.   ? ?Goal Blood glucose:  ?  Fasting (before meals) = 80 to 130 ?  Within 2 hours of eating = less than 180 ? ? ?Understanding your Hemoglobin A1c: 7.2 today. Increase Ozempic to 1 mg weekly and decrease metformin to 500 mg twice daily ? ? ? ? ?Diabetes Mellitus and Nutrition ? ? ? ?I think that you would greatly benefit from seeing a nutritionist. If this is something you are interested in, please call Dr Jenne Campus at [phone] to schedule an appointment. ? ? ?When you have diabetes (diabetes mellitus), it is very important to have healthy eating habits because your blood sugar (glucose) levels are greatly affected by what you eat and drink. Eating healthy foods in the appropriate amounts, at about the same times every day, can help you: ?Control your blood glucose. ?Lower your risk of heart disease. ?Improve your blood pressure. ?Reach or maintain a healthy weight. ? ?Every person with diabetes is different, and each person has different needs for a meal plan. Your health care provider may recommend that you work with a diet and nutrition specialist (dietitian) to make a meal plan that is best for you. Your meal plan may vary depending on factors such as: ?The calories you need. ?The medicines you take. ?Your weight. ?Your blood glucose, blood pressure, and cholesterol levels. ?Your activity level. ?Other health conditions you have, such as heart or kidney disease. ? ?How do carbohydrates affect me? ?Carbohydrates affect your blood glucose level more than any other type of food. Eating carbohydrates naturally increases the amount of glucose in your blood. Carbohydrate counting is a method for keeping track of how many carbohydrates you eat. Counting carbohydrates is important to keep your blood glucose at a healthy level, especially if you use insulin or take certain  oral diabetes medicines. ?It is important to know how many carbohydrates you can safely have in each meal. This is different for every person. Your dietitian can help you calculate how many carbohydrates you should have at each meal and for snack. ?Foods that contain carbohydrates include: ?Bread, cereal, rice, pasta, and crackers. ?Potatoes and corn. ?Peas, beans, and lentils. ?Milk and yogurt. ?Fruit and juice. ?Desserts, such as cakes, cookies, ice cream, and candy. ? ?How does alcohol affect me? ?Alcohol can cause a sudden decrease in blood glucose (hypoglycemia), especially if you use insulin or take certain oral diabetes medicines. Hypoglycemia can be a life-threatening condition. Symptoms of hypoglycemia (sleepiness, dizziness, and confusion) are similar to symptoms of having too much alcohol. ?If your health care provider says that alcohol is safe for you, follow these guidelines: ?Limit alcohol intake to no more than 1 drink per day for nonpregnant women and 2 drinks per day for men. One drink equals 12 oz of beer, 5 oz of wine, or 1? oz of hard liquor. ?Do not drink on an empty stomach. ?Keep yourself hydrated with water, diet soda, or unsweetened iced tea. ?Keep in mind that regular soda, juice, and other mixers may contain a lot of sugar and must be counted as carbohydrates. ? ?What are tips for following this plan? ? ?Reading food labels ?Start by checking the serving size on the label. The amount of calories, carbohydrates, fats, and other nutrients listed on the label are based on one serving of the food. Many foods contain more than one serving  per package. ?Check the total grams (g) of carbohydrates in one serving. You can calculate the number of servings of carbohydrates in one serving by dividing the total carbohydrates by 15. For example, if a food has 30 g of total carbohydrates, it would be equal to 2 servings of carbohydrates. ?Check the number of grams (g) of saturated and trans fats in one  serving. Choose foods that have low or no amount of these fats. ?Check the number of milligrams (mg) of sodium in one serving. Most people should limit total sodium intake to less than 2,300 mg per day. ?Always check the nutrition information of foods labeled as "low-fat" or "nonfat". These foods may be higher in added sugar or refined carbohydrates and should be avoided. ?Talk to your dietitian to identify your daily goals for nutrients listed on the label. ? ?Shopping ?Avoid buying canned, premade, or processed foods. These foods tend to be high in fat, sodium, and added sugar. ?Shop around the outside edge of the grocery store. This includes fresh fruits and vegetables, bulk grains, fresh meats, and fresh dairy. ? ?Cooking ?Use low-heat cooking methods, such as baking, instead of high-heat cooking methods like deep frying. ?Cook using healthy oils, such as olive, canola, or sunflower oil. ?Avoid cooking with butter, cream, or high-fat meats. ? ?Meal planning ?Eat meals and snacks regularly, preferably at the same times every day. Avoid going long periods of time without eating. ?Eat foods high in fiber, such as fresh fruits, vegetables, beans, and whole grains. Talk to your dietitian about how many servings of carbohydrates you can eat at each meal. ?Eat 4-6 ounces of lean protein each day, such as lean meat, chicken, fish, eggs, or tofu. 1 ounce is equal to 1 ounce of meat, chicken, or fish, 1 egg, or 1/4 cup of tofu. ?Eat some foods each day that contain healthy fats, such as avocado, nuts, seeds, and fish. ? ?Lifestyle ? ?Check your blood glucose regularly. ?Exercise at least 30 minutes 5 or more days each week, or as told by your health care provider. ?Take medicines as told by your health care provider. ?Do not use any products that contain nicotine or tobacco, such as cigarettes and e-cigarettes. If you need help quitting, ask your health care provider. ?Work with a Social worker or diabetes educator to  identify strategies to manage stress and any emotional and social challenges. ? ?What are some questions to ask my health care provider? ?Do I need to meet with a diabetes educator? ?Do I need to meet with a dietitian? ?What number can I call if I have questions? ?When are the best times to check my blood glucose? ? ?Where to find more information: ?American Diabetes Association: diabetes.org/food-and-fitness/food ?Academy of Nutrition and Dietetics: PokerClues.dk ?[employer] of Diabetes and Digestive and Kidney Diseases (NIH): ContactWire.be ? ?Summary ?A healthy meal plan will help you control your blood glucose and maintain a healthy lifestyle. ?Working with a diet and nutrition specialist (dietitian) can help you make a meal plan that is best for you. ?Keep in mind that carbohydrates and alcohol have immediate effects on your blood glucose levels. It is important to count carbohydrates and to use alcohol carefully. ?This information is not intended to replace advice given to you by your health care provider. Make sure you discuss any questions you have with your health care provider. ?Document Released: 12/07/2004 Document Revised: 04/16/2016 Document Reviewed: 04/16/2016 ?Elsevier Interactive Patient Education ? 2018 [place]. ? ? ? ?

## 2021-07-14 LAB — CBC WITH DIFFERENTIAL/PLATELET
Basophils Absolute: 0.1 10*3/uL (ref 0.0–0.2)
Basos: 1 %
EOS (ABSOLUTE): 0.3 10*3/uL (ref 0.0–0.4)
Eos: 4 %
Hematocrit: 37.8 % (ref 34.0–46.6)
Hemoglobin: 13 g/dL (ref 11.1–15.9)
Immature Grans (Abs): 0 10*3/uL (ref 0.0–0.1)
Immature Granulocytes: 0 %
Lymphocytes Absolute: 2.5 10*3/uL (ref 0.7–3.1)
Lymphs: 37 %
MCH: 30.9 pg (ref 26.6–33.0)
MCHC: 34.4 g/dL (ref 31.5–35.7)
MCV: 90 fL (ref 79–97)
Monocytes Absolute: 0.6 10*3/uL (ref 0.1–0.9)
Monocytes: 8 %
Neutrophils Absolute: 3.3 10*3/uL (ref 1.4–7.0)
Neutrophils: 50 %
Platelets: 328 10*3/uL (ref 150–450)
RBC: 4.21 x10E6/uL (ref 3.77–5.28)
RDW: 11.6 % — ABNORMAL LOW (ref 11.7–15.4)
WBC: 6.7 10*3/uL (ref 3.4–10.8)

## 2021-07-14 LAB — LIPID PANEL
Chol/HDL Ratio: 3.3 ratio (ref 0.0–4.4)
Cholesterol, Total: 128 mg/dL (ref 100–199)
HDL: 39 mg/dL — ABNORMAL LOW (ref >39)
LDL Chol Calc (NIH): 65 mg/dL (ref 0–99)
Triglycerides: 137 mg/dL (ref 0–149)
VLDL Cholesterol Cal: 24 mg/dL (ref 5–40)

## 2021-07-14 LAB — CMP14+EGFR
ALT: 22 IU/L (ref 0–32)
AST: 20 IU/L (ref 0–40)
Albumin/Globulin Ratio: 2 (ref 1.2–2.2)
Albumin: 4.3 g/dL (ref 3.8–4.9)
Alkaline Phosphatase: 55 IU/L (ref 44–121)
BUN/Creatinine Ratio: 18 (ref 9–23)
BUN: 11 mg/dL (ref 6–24)
Bilirubin Total: <0.2 mg/dL (ref 0.0–1.2)
CO2: 23 mmol/L (ref 20–29)
Calcium: 9.3 mg/dL (ref 8.7–10.2)
Chloride: 102 mmol/L (ref 96–106)
Creatinine, Ser: 0.61 mg/dL (ref 0.57–1.00)
Globulin, Total: 2.1 g/dL (ref 1.5–4.5)
Glucose: 148 mg/dL — ABNORMAL HIGH (ref 70–99)
Potassium: 4.8 mmol/L (ref 3.5–5.2)
Sodium: 140 mmol/L (ref 134–144)
Total Protein: 6.4 g/dL (ref 6.0–8.5)
eGFR: 106 mL/min/{1.73_m2} (ref >59)

## 2021-07-14 LAB — THYROID PANEL WITH TSH
Free Thyroxine Index: 1.6 (ref 1.2–4.9)
T3 Uptake Ratio: 28 % (ref 24–39)
T4, Total: 5.7 ug/dL (ref 4.5–12.0)
TSH: 1.1 u[IU]/mL (ref 0.450–4.500)

## 2021-08-15 ENCOUNTER — Emergency Department (HOSPITAL_COMMUNITY): Payer: Managed Care, Other (non HMO)

## 2021-08-15 ENCOUNTER — Other Ambulatory Visit: Payer: Self-pay

## 2021-08-15 ENCOUNTER — Emergency Department (HOSPITAL_COMMUNITY)
Admission: EM | Admit: 2021-08-15 | Discharge: 2021-08-15 | Discharge status: Against Medical Advice | Payer: Managed Care, Other (non HMO) | Attending: Physician Assistant | Admitting: Physician Assistant

## 2021-08-15 ENCOUNTER — Telehealth: Payer: Self-pay | Admitting: Neurology

## 2021-08-15 ENCOUNTER — Encounter: Payer: Self-pay | Admitting: Family Medicine

## 2021-08-15 DIAGNOSIS — R519 Headache, unspecified: Secondary | ICD-10-CM | POA: Insufficient documentation

## 2021-08-15 DIAGNOSIS — H538 Other visual disturbances: Secondary | ICD-10-CM | POA: Insufficient documentation

## 2021-08-15 DIAGNOSIS — R42 Dizziness and giddiness: Secondary | ICD-10-CM | POA: Diagnosis present

## 2021-08-15 DIAGNOSIS — Z5321 Procedure and treatment not carried out due to patient leaving prior to being seen by health care provider: Secondary | ICD-10-CM | POA: Diagnosis not present

## 2021-08-15 LAB — DIFFERENTIAL
Abs Immature Granulocytes: 0.05 10*3/uL (ref 0.00–0.07)
Basophils Absolute: 0.1 10*3/uL (ref 0.0–0.1)
Basophils Relative: 1 %
Eosinophils Absolute: 0.1 10*3/uL (ref 0.0–0.5)
Eosinophils Relative: 1 %
Immature Granulocytes: 1 %
Lymphocytes Relative: 15 %
Lymphs Abs: 1.7 10*3/uL (ref 0.7–4.0)
Monocytes Absolute: 0.3 10*3/uL (ref 0.1–1.0)
Monocytes Relative: 3 %
Neutro Abs: 8.9 10*3/uL — ABNORMAL HIGH (ref 1.7–7.7)
Neutrophils Relative %: 79 %

## 2021-08-15 LAB — COMPREHENSIVE METABOLIC PANEL
ALT: 33 U/L (ref 0–44)
AST: 26 U/L (ref 15–41)
Albumin: 4.3 g/dL (ref 3.5–5.0)
Alkaline Phosphatase: 56 U/L (ref 38–126)
Anion gap: 12 (ref 5–15)
BUN: 15 mg/dL (ref 6–20)
CO2: 25 mmol/L (ref 22–32)
Calcium: 9.4 mg/dL (ref 8.9–10.3)
Chloride: 100 mmol/L (ref 98–111)
Creatinine, Ser: 0.88 mg/dL (ref 0.44–1.00)
GFR, Estimated: >60 mL/min (ref >60)
Glucose, Bld: 219 mg/dL — ABNORMAL HIGH (ref 70–99)
Potassium: 4.6 mmol/L (ref 3.5–5.1)
Sodium: 137 mmol/L (ref 135–145)
Total Bilirubin: 0.6 mg/dL (ref 0.3–1.2)
Total Protein: 7 g/dL (ref 6.5–8.1)

## 2021-08-15 LAB — CBC
HCT: 41.6 % (ref 36.0–46.0)
Hemoglobin: 14.2 g/dL (ref 12.0–15.0)
MCH: 31.1 pg (ref 26.0–34.0)
MCHC: 34.1 g/dL (ref 30.0–36.0)
MCV: 91.2 fL (ref 80.0–100.0)
Platelets: 353 10*3/uL (ref 150–400)
RBC: 4.56 MIL/uL (ref 3.87–5.11)
RDW: 11.9 % (ref 11.5–15.5)
WBC: 11.1 10*3/uL — ABNORMAL HIGH (ref 4.0–10.5)
nRBC: 0 % (ref 0.0–0.2)

## 2021-08-15 LAB — PROTIME-INR
INR: 1 (ref 0.8–1.2)
Prothrombin Time: 13.1 seconds (ref 11.4–15.2)

## 2021-08-15 LAB — I-STAT CHEM 8, ED
BUN: 17 mg/dL (ref 6–20)
Calcium, Ion: 1.09 mmol/L — ABNORMAL LOW (ref 1.15–1.40)
Chloride: 98 mmol/L (ref 98–111)
Creatinine, Ser: 0.8 mg/dL (ref 0.44–1.00)
Glucose, Bld: 227 mg/dL — ABNORMAL HIGH (ref 70–99)
HCT: 42 % (ref 36.0–46.0)
Hemoglobin: 14.3 g/dL (ref 12.0–15.0)
Potassium: 4.5 mmol/L (ref 3.5–5.1)
Sodium: 135 mmol/L (ref 135–145)
TCO2: 25 mmol/L (ref 22–32)

## 2021-08-15 LAB — ETHANOL: Alcohol, Ethyl (B): <10 mg/dL (ref <10)

## 2021-08-15 LAB — APTT: aPTT: 26 seconds (ref 24–36)

## 2021-08-15 NOTE — ED Provider Triage Note (Signed)
Emergency Medicine Provider Triage Evaluation Note  Kaylee Bryant , a 56 y.o. female  was evaluated in triage.  Pt complains of dizziness, blurry vision and feeling off balance.  Symptoms began at 10 AM today.  She was at work and noticed that she was having blurry vision while trying to read the names of medications.  Since then her symptoms have not improved.  She has been having a slight right-sided headache but denies any chest pain, shortness of breath, abdominal pain or vomiting.  She does have a history of MS and has been compliant with medications which she feels overall does control her disease.  Reports electrical current feeling throughout torso. Happens sometimes but more often today. Denies any injuries or falls.  Review of Systems  Positive: Dizziness, blurry vision Negative: Chest pain  Physical Exam  BP 136/86 (BP Location: Right Arm)   Pulse 80   Temp 98.1 F (36.7 C)   Resp 16   LMP 10/03/2014 Comment: last period was approx 6 months ago, "i don't have it anymore"  SpO2 100%  Gen:   Awake, no distress   Resp:  Normal effort  MSK:   Moves extremities without difficulty  Other:  Visual fields are intact.  No facial asymmetry.  Normal sensation to light touch of face, bilateral upper and lower extremities.  Strength 5/5 in bilateral upper and lower extremities.  No aphasia.  Medical Decision Making  Medically screening exam initiated at 4:15 PM.  Appropriate orders placed.  Kaylee Bryant was informed that the remainder of the evaluation will be completed by another provider, this initial triage assessment does not replace that evaluation, and the importance of remaining in the ED until their evaluation is complete.  I have ordered labs.  I have consulted neurology, Dr. Theda Sers who is evaluating patient in triage.  Has recommended MRI of the brain with contrast which I have ordered.   Delia Heady, PA-C 08/15/21 1715

## 2021-08-15 NOTE — Telephone Encounter (Signed)
I spoke to the patient. 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 She called her husband to drive her home from work. She has multiple cardiovascular risks, including MS (on Ocrevus), diabetes, hx of MI/CABG. She was instructed to go immediately to the ED. I have spoken to the Dr. Krista Blue and she is in agreement with this plan.

## 2021-08-15 NOTE — Telephone Encounter (Signed)
I spoke to the patient directly at 3pm and instructed the following:    I spoke to the patient. 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 She called her husband to drive her home from work. She has multiple cardiovascular risks, including MS (on Ocrevus), diabetes, hx of MI/CABG. She was instructed to go immediately to the ED. I have spoken to the Dr. Krista Blue and she is in agreement with this plan.

## 2021-08-15 NOTE — ED Notes (Signed)
Patient called x3 for vitals recheck. This NT called MRI who confirmed that patient never made it to MRI. Patient not in lobby at this time

## 2021-08-15 NOTE — ED Triage Notes (Signed)
Pt w increasing dizziness, gait disturbances & blurred vision since 10a. MS dx 2005, "usually no issues since diagnosis." States she's "tripping around, but haven't actually fallen." Pt A&O4, at baseline in triage, no distress noted

## 2021-08-15 NOTE — Telephone Encounter (Signed)
Husband states he and daughter had to pick pt up from work.  Since 11:00 pt has felt dizzy and having issues re: having to focus extra hard to see clearly, please call.

## 2021-08-17 ENCOUNTER — Emergency Department (HOSPITAL_COMMUNITY)
Admission: EM | Admit: 2021-08-17 | Discharge: 2021-08-17 | Disposition: A | Discharge status: Home / Self Care | Payer: Managed Care, Other (non HMO) | Attending: Emergency Medicine | Admitting: Emergency Medicine

## 2021-08-17 ENCOUNTER — Emergency Department (HOSPITAL_COMMUNITY): Payer: Managed Care, Other (non HMO)

## 2021-08-17 ENCOUNTER — Encounter (HOSPITAL_COMMUNITY): Payer: Self-pay

## 2021-08-17 ENCOUNTER — Other Ambulatory Visit: Payer: Self-pay

## 2021-08-17 DIAGNOSIS — Z79899 Other long term (current) drug therapy: Secondary | ICD-10-CM | POA: Diagnosis not present

## 2021-08-17 DIAGNOSIS — H538 Other visual disturbances: Secondary | ICD-10-CM | POA: Insufficient documentation

## 2021-08-17 DIAGNOSIS — Z7984 Long term (current) use of oral hypoglycemic drugs: Secondary | ICD-10-CM | POA: Diagnosis not present

## 2021-08-17 DIAGNOSIS — G35 Multiple sclerosis: Secondary | ICD-10-CM | POA: Diagnosis not present

## 2021-08-17 DIAGNOSIS — E119 Type 2 diabetes mellitus without complications: Secondary | ICD-10-CM | POA: Diagnosis not present

## 2021-08-17 DIAGNOSIS — Z7982 Long term (current) use of aspirin: Secondary | ICD-10-CM | POA: Insufficient documentation

## 2021-08-17 DIAGNOSIS — H539 Unspecified visual disturbance: Secondary | ICD-10-CM

## 2021-08-17 LAB — I-STAT CHEM 8, ED
BUN: 14 mg/dL (ref 6–20)
Calcium, Ion: 1.14 mmol/L — ABNORMAL LOW (ref 1.15–1.40)
Chloride: 100 mmol/L (ref 98–111)
Creatinine, Ser: 0.7 mg/dL (ref 0.44–1.00)
Glucose, Bld: 216 mg/dL — ABNORMAL HIGH (ref 70–99)
HCT: 49 % — ABNORMAL HIGH (ref 36.0–46.0)
Hemoglobin: 16.7 g/dL — ABNORMAL HIGH (ref 12.0–15.0)
Potassium: 4.7 mmol/L (ref 3.5–5.1)
Sodium: 139 mmol/L (ref 135–145)
TCO2: 31 mmol/L (ref 22–32)

## 2021-08-17 MED ORDER — GADOBUTROL 1 MMOL/ML IV SOLN
10.0000 mL | Freq: Once | INTRAVENOUS | Status: AC | PRN
  Administered 2021-08-17: 10 mL via INTRAVENOUS

## 2021-08-17 NOTE — ED Notes (Signed)
Patient back from MRI.

## 2021-08-17 NOTE — ED Provider Notes (Signed)
[facility] EMERGENCY DEPARTMENT Provider Note   CSN: [id number] Arrival date & time: 08/17/21  7829     History  Chief complaint: Double vision  Kaylee Bryant is a 56 y.o. female.  HPI  Patient has a history of multiple sclerosis, diabetes, , irritable bowel syndrome who presents to the ED with transient vision changes.  Patient started having symptoms of double vision a few days ago.  She called her neurologist who suggested she come to the ED for an emergent MRI.  Patient was screened in the emergency room on the 23rd.  An MRI of the brain was ordered.  Patient states however she started to have pain and discomfort associated with her MS and did not feel comfortable waiting so she left.  Patient spoke to her neurologist to still feels that she should have the MRI performed so she returns to the ED.  Patient states her symptoms have resolved at this point.  She is not having any double vision.  No numbness or weakness.  Home Medications Prior to Admission medications   Medication Sig Start Date End Date Taking? Authorizing Provider  acetaminophen (TYLENOL) 500 MG tablet Take 1,000 mg by mouth every 6 (six) hours as needed for mild pain.    [provider]  aspirin EC 81 MG tablet Take 81 mg by mouth daily.    [provider]  atorvastatin (LIPITOR) 80 MG tablet TAKE 1 TABLET BY MOUTH DAILY AT 6 PM. 08/09/20   Lorretta Harp, MD  baclofen (LIORESAL) 10 MG tablet TAKE 2 TABLETS BY MOUTH 3 TIMES DAILY. 04/10/21   Marcial Pacas, MD  Continuous Blood Gluc Receiver (FREESTYLE LIBRE 2 READER) DEVI 1 Device by Does not apply route 4 (four) times daily. 12/15/20   Baruch Gouty, FNP  Continuous Blood Gluc Sensor (FREESTYLE LIBRE 2 SENSOR) MISC 1 Device by Does not apply route 4 (four) times daily. Dx E11.65 03/23/21   Baruch Gouty, FNP  docusate sodium (COLACE) 100 MG capsule     [provider]  gabapentin (NEURONTIN) 300 MG capsule Take 2 capsules  (600 mg total) by mouth 3 (three) times daily. 08/03/20   Marcial Pacas, MD  lisinopril (ZESTRIL) 10 MG tablet TAKE 1 TABLET (10 MG TOTAL) BY MOUTH IN THE MORNING AND AT BEDTIME. 08/09/20   Lorretta Harp, MD  metFORMIN (GLUCOPHAGE) 500 MG tablet Take 1 tablet (500 mg total) by mouth 2 (two) times daily with a meal. 07/13/21   Rakes, Connye Burkitt, FNP  metoprolol tartrate (LOPRESSOR) 25 MG tablet TAKE 1 TABLET BY MOUTH TWICE A DAY 08/09/20   Lorretta Harp, MD  ocrelizumab (OCREVUS) 300 MG/10ML injection Inject 20 mLs (600 mg total) into the vein every 6 (six) months. 01/03/21   Marcial Pacas, MD  Semaglutide, 1 MG/DOSE, 4 MG/3ML SOPN Inject 1 mg as directed once a week. 07/13/21   Baruch Gouty, FNP  tiZANidine (ZANAFLEX) 2 MG tablet TAKE 1 TABLET BY MOUTH 3 TIMES DAILY. 05/08/21   Marcial Pacas, MD  venlafaxine XR (EFFEXOR-XR) 150 MG 24 hr capsule Take 1 capsule (150 mg total) by mouth daily with breakfast. 07/13/21   Baruch Gouty, FNP  venlafaxine XR (EFFEXOR-XR) 75 MG 24 hr capsule Take 1 capsule (75 mg total) by mouth daily with breakfast. 07/13/21   Rakes, Connye Burkitt, FNP      Allergies    Patient has no known allergies.    Review of Systems   Review  of Systems  All other systems reviewed and are negative.  Physical Exam Updated Vital Signs BP 114/78 (BP Location: Right Arm)   Pulse 87   Temp 98.7 F (37.1 C) (Oral)   Resp 16   Ht 1.6 m ('5\' 3"'$ )   Wt 77.1 kg   LMP 10/03/2014 Comment: last period was approx 6 months ago, "i don't have it anymore"  SpO2 100%   BMI 30.11 kg/m  Physical Exam Vitals and nursing note reviewed.  Constitutional:      General: She is not in acute distress.    Appearance: She is well-developed.  HENT:     Head: Normocephalic and atraumatic.     Right Ear: External ear normal.     Left Ear: External ear normal.  Eyes:     General: No scleral icterus.       Right eye: No discharge.        Left eye: No discharge.     Conjunctiva/sclera: Conjunctivae normal.   Neck:     Trachea: No tracheal deviation.  Cardiovascular:     Rate and Rhythm: Normal rate and regular rhythm.  Pulmonary:     Effort: Pulmonary effort is normal. No respiratory distress.     Breath sounds: Normal breath sounds. No stridor. No wheezing or rales.  Abdominal:     General: Bowel sounds are normal. There is no distension.     Palpations: Abdomen is soft.     Tenderness: There is no abdominal tenderness. There is no guarding or rebound.  Musculoskeletal:        General: No tenderness.     Cervical back: Neck supple.  Skin:    General: Skin is warm and dry.     Findings: No rash.  Neurological:     Mental Status: She is alert and oriented to person, place, and time.     Cranial Nerves: No cranial nerve deficit.     Sensory: No sensory deficit.     Motor: No abnormal muscle tone or seizure activity.     Coordination: Coordination normal.     Comments: Equal grip strength and leg strength bilaterally, sensation intact in all extremities, no visual field cuts, no left or right sided neglect, , no nystagmus noted  No facial droop, extraocular movements intact, tongue midline    ED Results / Procedures / Treatments   Labs (all labs ordered are listed, but only abnormal results are displayed) Labs Reviewed  I-STAT CHEM 8, ED - Abnormal; Notable for the following components:      Result Value   Glucose, Bld 216 (*)    Calcium, Ion 1.14 (*)    Hemoglobin 16.7 (*)    HCT 49.0 (*)    All other components within normal limits    EKG None  Radiology MR Brain W and Wo Contrast  Result Date: 08/17/2021 CLINICAL DATA:  Neuro deficit, acute, stroke suspected acute vision changes EXAM: MRI HEAD WITHOUT AND WITH CONTRAST TECHNIQUE: Multiplanar, multiecho pulse sequences of the brain and surrounding structures were obtained without and with intravenous contrast. CONTRAST:  54m GADAVIST GADOBUTROL 1 MMOL/ML IV SOLN COMPARISON:  05/24/2020 images; report dictated by  non-radiologist is not reviewed FINDINGS: Brain: Foci of T2 hyperintensity in the periventricular greater than subcortical white matter in identified and consistent with history of multiple sclerosis. No definite new lesion. Minimal posterior fossa involvement is also unchanged. There is no abnormal enhancement. There is no acute infarction or intracranial hemorrhage. There is no intracranial mass,  mass effect, or edema. There is no hydrocephalus or extra-axial fluid collection. Ventricles and sulci are stable in size and configuration. Vascular: Major vessel flow voids at the skull base are preserved. Skull and upper cervical spine: Normal marrow signal is preserved. Sinuses/Orbits: Paranasal sinuses are aerated. Orbits are unremarkable. Other: Sella is unremarkable.  Mastoid air cells are clear. IMPRESSION: No acute infarction, hemorrhage, or mass. Stable burden chronic demyelination.  No abnormal enhancement. Electronically Signed   By: Macy Mis M.D.   On: 08/17/2021 14:16    Procedures Procedures    Medications Ordered in ED Medications  gadobutrol (GADAVIST) 1 MMOL/ML injection 10 mL (10 mLs Intravenous Contrast Given 08/17/21 1405)    ED Course/ Medical Decision Making/ A&P Clinical Course as of 08/18/21 0847  Thu Aug 17, 2021  1203 I-stat chem 8, ED (not at [facility] or [facility])(!) Hyperglycemia noted but no other acute electrolyte abnormalities [JK]  1419 MRI report does not show any signs of acute infarction hemorrhage or mass.  Stable changes noted associated with her MS [JK]    Clinical Course User Index [JK] Dorie Rank, MD                           Medical Decision Making Problems Addressed: Multiple sclerosis [facility]): chronic illness or injury with exacerbation, progression, or side effects of treatment Visual disturbance: acute illness or injury  Amount and/or Complexity of Data Reviewed Labs: ordered. Decision-making details documented in ED Course. Radiology: ordered and  independent interpretation performed.  Risk Prescription drug management.   Pt sent in for MRI to rule out stroke, exacerbation of her MS.  Sx resolved.  MRI without acute findings.  Evaluation and diagnostic testing in the emergency department does not suggest an emergent condition requiring admission or immediate intervention beyond what has been performed at this time.  The patient is safe for discharge and has been instructed to return immediately for worsening symptoms, change in symptoms or any other concerns.         Final Clinical Impression(s) / ED Diagnoses Final diagnoses:  Multiple sclerosis ([place])  Visual disturbance    Rx / DC Orders ED Discharge Orders     None         Dorie Rank, MD 08/18/21 [phone]

## 2021-08-17 NOTE — ED Triage Notes (Signed)
Pt arrived POV from home c/o a MS flareup and was told by her neurologist to come have an MRI. Pt was having dizziness and blurred vision on Tuesday came but left without being seen. Symptoms have none resolved but she said her doctor told her she had to come for an MRI.

## 2021-08-17 NOTE — Discharge Instructions (Signed)
The MRI today did not show any signs of acute stroke or acute MS flare.  Follow-up with your neurologist as planned.  Return as needed for recurrent symptoms

## 2021-08-18 ENCOUNTER — Ambulatory Visit: Payer: Managed Care, Other (non HMO) | Admitting: Neurology

## 2021-08-24 ENCOUNTER — Other Ambulatory Visit: Payer: Self-pay | Admitting: Cardiovascular Disease

## 2021-08-24 ENCOUNTER — Other Ambulatory Visit: Payer: Self-pay | Admitting: Neurology

## 2021-09-19 ENCOUNTER — Other Ambulatory Visit: Payer: Self-pay | Admitting: Cardiovascular Disease

## 2021-10-13 ENCOUNTER — Other Ambulatory Visit (HOSPITAL_COMMUNITY)
Admission: RE | Admit: 2021-10-13 | Discharge: 2021-10-13 | Disposition: A | Discharge status: Home / Self Care | Payer: Managed Care, Other (non HMO) | Source: Ambulatory Visit | Attending: Family Medicine | Admitting: Family Medicine

## 2021-10-13 ENCOUNTER — Ambulatory Visit: Payer: Managed Care, Other (non HMO) | Admitting: Family Medicine

## 2021-10-13 ENCOUNTER — Encounter: Payer: Self-pay | Admitting: Family Medicine

## 2021-10-13 VITALS — BP 123/74 | HR 67 | Temp 98.7°F | Ht 63.0 in | Wt 167.0 lb

## 2021-10-13 DIAGNOSIS — Z124 Encounter for screening for malignant neoplasm of cervix: Secondary | ICD-10-CM

## 2021-10-13 DIAGNOSIS — Z1159 Encounter for screening for other viral diseases: Secondary | ICD-10-CM | POA: Diagnosis not present

## 2021-10-13 DIAGNOSIS — Z1231 Encounter for screening mammogram for malignant neoplasm of breast: Secondary | ICD-10-CM

## 2021-10-13 DIAGNOSIS — Z0000 Encounter for general adult medical examination without abnormal findings: Secondary | ICD-10-CM

## 2021-10-13 DIAGNOSIS — Z114 Encounter for screening for human immunodeficiency virus [HIV]: Secondary | ICD-10-CM

## 2021-10-13 LAB — BAYER DCA HB A1C WAIVED: HB A1C (BAYER DCA - WAIVED): 6.5 % — ABNORMAL HIGH (ref 4.8–5.6)

## 2021-10-13 NOTE — Progress Notes (Signed)
Subjective:  Patient ID: Kaylee Bryant, female    DOB: [birth year], 56 y.o.   MRN: [id number]  Patient Care Team: Baruch Gouty, FNP as PCP - General (Family Medicine)   Chief Complaint:  Annual Exam (With PAP)   HPI: Kaylee Bryant is a 56 y.o. female presenting on 10/13/2021 for Annual Exam (With PAP)   Pt presents today for her annual physical exam. She reports she has been doing well. No recent MS flares. Diabetes is well controlled. She continues to work full time and does ok with this. No specific complaints or concerns today. She is doing well on current dose of Ozempic, denies side effects. Vaccinations are up to date. She is due for eye exam and mammogram, both have been scheduled. Colonoscopy is up to date.     Relevant past medical, surgical, family, and social history reviewed and updated as indicated.  Allergies and medications reviewed and updated. Data reviewed: Chart in Epic.   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   LIMA to LAD off pump    Past Surgical History:  Procedure Laterality Date   CARDIAC CATHETERIZATION N/A 04/05/2015   Procedure: Left Heart Cath and Coronary Angiography;  Surgeon: Troy Sine, MD;  Location: [facility];  Service: Cardiovascular;  Laterality: N/A;   CERVICAL CONE BIOPSY  2004   CESAREAN SECTION     1996, 2006   CHOLECYSTECTOMY     1988   COLPOSCOPY  2004   CORONARY ARTERY BYPASS GRAFT N/A 04/07/2015   Procedure: CORONARY ARTERY BYPASS GRAFTING (CABG), OFF PUMP, TIMES ONE, USING LEFT INTERNAL MAMMARY  ARTERY;  Surgeon: Rexene Alberts, MD;  Location: [facility];  Service: Open Heart Surgery;  Laterality: N/A;  LIMA to LAD   TEE WITHOUT CARDIOVERSION N/A 04/07/2015   Procedure: TRANSESOPHAGEAL ECHOCARDIOGRAM (TEE);  Surgeon: Rexene Alberts, MD;  Location: [facility];  Service: Open Heart Surgery;  Laterality: N/A;    Social History   Socioeconomic History   Marital status: Married    Spouse name: Not on file   Number of children: 4   Years of education: college   Highest education level: Not on file  Occupational History   Occupation: Programmer, multimedia: [employer]  Tobacco Use   Smoking status: Former    Packs/day: 1.50    Years: 15.00    Total pack years: 22.50    Types: Cigarettes    Quit date: 02/03/2004    Years since quitting: 17.7   Smokeless tobacco: Never  Substance and Sexual Activity   Alcohol use: Yes    Alcohol/week: 1.0 standard drink of alcohol    Types: 1 Glasses of wine per week   Drug use: No   Sexual activity: Yes    Partners: Male    Birth control/protection: Surgical    Comment: vasectomy  Other Topics Concern   Not on file  Social History Narrative   Patient lives  at home with her husband and children. Patient works for the [employer] in [place]. College education.    Social Determinants of Health   Financial Resource Strain: Not on file  Food Insecurity: Not on file  Transportation Needs: Not on file  Physical Activity: Not on file  Stress: Not on file  Social Connections: Not on file  Intimate Partner Violence: Not on file    Outpatient Encounter Medications as of 10/13/2021  Medication Sig   acetaminophen (TYLENOL) 500 MG tablet Take 1,000 mg by mouth every 6 (six) hours as needed for mild pain.   aspirin EC 81 MG tablet Take 81 mg by mouth daily.   atorvastatin (LIPITOR) 80 MG tablet TAKE 1 TABLET BY MOUTH DAILY AT 6 PM. SCHEDULE OFFICE VISIT FOR FUTURE REFILLS.   baclofen (LIORESAL) 10 MG tablet TAKE 2 TABLETS BY MOUTH 3 TIMES DAILY.    Continuous Blood Gluc Receiver (FREESTYLE LIBRE 2 READER) DEVI 1 Device by Does not apply route 4 (four) times daily.   Continuous Blood Gluc Sensor (FREESTYLE LIBRE 2 SENSOR) MISC 1 Device by Does not apply route 4 (four) times daily. Dx E11.65   diclofenac (VOLTAREN) 75 MG EC tablet Take by mouth.   docusate sodium (COLACE) 100 MG capsule    gabapentin (NEURONTIN) 300 MG capsule Take 2 capsules (600 mg total) by mouth 3 (three) times daily. Please keep upcoming appt for continued refills   lisinopril (ZESTRIL) 10 MG tablet TAKE 1 TABLET (10 MG TOTAL) IN THE MORNING AND AT BEDTIME. SCHEDULE OFFICE VISIT FOR FUTURE REFILLS.   metFORMIN (GLUCOPHAGE) 500 MG tablet Take 1 tablet (500 mg total) by mouth 2 (two) times daily with a meal.   metoprolol tartrate (LOPRESSOR) 25 MG tablet TAKE 1 TABLET (25 MG TOTAL) BY MOUTH 2 (TWO) TIMES DAILY. SCHEDULE OFFICE VISIT FOR FUTURE REFILLS   ocrelizumab (OCREVUS) 300 MG/10ML injection Inject 20 mLs (600 mg total) into the vein every 6 (six) months.   Semaglutide, 1 MG/DOSE, 4 MG/3ML SOPN Inject 1 mg as directed once a week.   tiZANidine (ZANAFLEX) 2 MG tablet Take 1 tablet (2 mg total) by mouth 3 (three) times daily. Please keep upcoming appt for continued refills   venlafaxine XR (EFFEXOR-XR) 150 MG 24 hr capsule Take 1 capsule (150 mg total) by mouth daily with breakfast.   venlafaxine XR (EFFEXOR-XR) 75 MG 24 hr capsule Take 1 capsule (75 mg total) by mouth daily with breakfast.   [DISCONTINUED] Continuous Blood Gluc Sensor (FREESTYLE LIBRE 2 SENSOR) MISC USE TO CHECK SUGAR 4 (FOUR) TIMES DAILY. DX E11.65   [DISCONTINUED] aspirin EC 81 MG tablet Take 1 tablet by mouth daily.   No facility-administered encounter medications on file as of 10/13/2021.    No Known Allergies  Review of Systems  Constitutional:  Negative for activity change, appetite change, chills, diaphoresis, fatigue, fever and unexpected weight change.  HENT: Negative.    Eyes: Negative.   Negative for photophobia and visual disturbance.  Respiratory:  Negative for cough, chest tightness and shortness of breath.   Cardiovascular:  Negative for chest pain, palpitations and leg swelling.  Gastrointestinal:  Negative for abdominal pain, blood in stool, constipation, diarrhea, nausea and vomiting.  Endocrine: Negative.  Negative for polydipsia, polyphagia and polyuria.  Genitourinary:  Negative for decreased urine volume, difficulty urinating, dysuria, frequency and urgency.  Musculoskeletal:  Positive for arthralgias and myalgias.  Skin: Negative.   Allergic/Immunologic: Negative.   Neurological:  Negative for dizziness, tremors, seizures, syncope, facial  asymmetry, speech difficulty, weakness, light-headedness, numbness and headaches.  Hematological: Negative.   Psychiatric/Behavioral:  Negative for confusion, hallucinations, sleep disturbance and suicidal ideas.   All other systems reviewed and are negative.       Objective:  BP 123/74   Pulse 67   Temp 98.7 F (37.1 C)   Ht 5' 3" (1.6 m)   Wt 167 lb (75.8 kg)   LMP 10/03/2014 Comment: last period was approx 6 months ago, "i don't have it anymore"  SpO2 99%   BMI 29.58 kg/m    Wt Readings from Last 3 Encounters:  10/13/21 167 lb (75.8 kg)  08/17/21 170 lb (77.1 kg)  07/13/21 172 lb (78 kg)    Physical Exam Vitals and nursing note reviewed.  Constitutional:      General: She is not in acute distress.    Appearance: Normal appearance. She is well-developed, well-groomed and overweight. She is not ill-appearing, toxic-appearing or diaphoretic.  HENT:     Head: Normocephalic and atraumatic.     Jaw: There is normal jaw occlusion.     Right Ear: Hearing, tympanic membrane, ear canal and external ear normal.     Left Ear: Hearing, tympanic membrane, ear canal and external ear normal.     Nose: Nose normal.     Mouth/Throat:     Lips: Pink.     Mouth: Mucous membranes are moist.     Pharynx: Oropharynx is  clear. Uvula midline.  Eyes:     General: Lids are normal.     Extraocular Movements: Extraocular movements intact.     Conjunctiva/sclera: Conjunctivae normal.     Pupils: Pupils are equal, round, and reactive to light.  Neck:     Thyroid: No thyroid mass, thyromegaly or thyroid tenderness.     Vascular: No carotid bruit or JVD.     Trachea: Trachea and phonation normal.  Cardiovascular:     Rate and Rhythm: Normal rate and regular rhythm.     Chest Wall: PMI is not displaced.     Pulses: Normal pulses.     Heart sounds: Normal heart sounds. No murmur heard.    No friction rub. No gallop.  Pulmonary:     Effort: Pulmonary effort is normal. No respiratory distress.     Breath sounds: Normal breath sounds. No wheezing.  Chest:     Chest wall: No mass, lacerations, deformity, swelling, tenderness, crepitus or edema. There is no dullness to percussion.  Breasts:    Tanner Score is 5.     Breasts are symmetrical.     Right: Normal.     Left: Normal.     Comments: Shelving noted to bilateral breasts, no concerning masses or lesions. Sternotomy well healed. Abdominal:     General: Bowel sounds are normal. There is no distension or abdominal bruit.     Palpations: Abdomen is soft. There is no hepatomegaly or splenomegaly.     Tenderness: There is no abdominal tenderness. There is no right CVA tenderness or left CVA tenderness.     Hernia: No hernia is present. There is no hernia in the left inguinal area or right inguinal area.  Genitourinary:    Exam position: Lithotomy position.     Pubic Area: No rash or pubic lice.      Tanner stage (genital): 5.     Labia:        Right: No rash, tenderness, lesion or injury.        Left: No rash, tenderness,  lesion or injury.      Urethra: No prolapse, urethral pain, urethral swelling or urethral lesion.     Vagina: Normal.     Cervix: Normal.     Uterus: Normal.      Adnexa: Right adnexa normal and left adnexa normal.     Rectum: Normal.     Musculoskeletal:        General: Normal range of motion.     Cervical back: Normal range of motion and neck supple.     Right lower leg: No edema.     Left lower leg: No edema.  Lymphadenopathy:     Cervical: No cervical adenopathy.     Upper Body:     Right upper body: No supraclavicular, axillary or pectoral adenopathy.     Left upper body: No supraclavicular, axillary or pectoral adenopathy.     Lower Body: No left inguinal adenopathy.  Skin:    General: Skin is warm and dry.     Capillary Refill: Capillary refill takes less than 2 seconds.     Coloration: Skin is not cyanotic, jaundiced or pale.     Findings: No rash.  Neurological:     General: No focal deficit present.     Mental Status: She is alert and oriented to person, place, and time.     Cranial Nerves: No cranial nerve deficit.     Sensory: Sensation is intact. No sensory deficit.     Motor: Motor function is intact. No weakness.     Coordination: Coordination is intact. Coordination normal.     Gait: Gait abnormal (slight limp).     Deep Tendon Reflexes: Reflexes are normal and symmetric. Reflexes normal.  Psychiatric:        Attention and Perception: Attention and perception normal.        Mood and Affect: Mood and affect normal.        Speech: Speech normal.        Behavior: Behavior normal. Behavior is cooperative.        Thought Content: Thought content normal.        Cognition and Memory: Cognition and memory normal.        Judgment: Judgment normal.     Results for orders placed or performed during the hospital encounter of 08/17/21  I-stat chem 8, ED (not at [facility] or [facility])  Result Value Ref Range   Sodium 139 135 - 145 mmol/L   Potassium 4.7 3.5 - 5.1 mmol/L   Chloride 100 98 - 111 mmol/L   BUN 14 6 - 20 mg/dL   Creatinine, Ser 0.70 0.44 - 1.00 mg/dL   Glucose, Bld 216 (H) 70 - 99 mg/dL   Calcium, Ion 1.14 (L) 1.15 - 1.40 mmol/L   TCO2 31 22 - 32 mmol/L   Hemoglobin 16.7 (H) 12.0 - 15.0 g/dL    HCT 49.0 (H) 36.0 - 46.0 %       Pertinent labs & imaging results that were available during my care of the patient were reviewed by me and considered in my medical decision making.  Assessment & Plan:  Kaylee Bryant was seen today for annual exam.  Diagnoses and all orders for this visit:  Annual physical exam Health maintenance discussed in detail. Labs pending. Diet and exercise encouraged. Has eye exam scheduled. Mammogram ordered. Colonoscopy up to date. Reports she has had Shingrix vaccinations at [facility].  -     CMP14+EGFR -     CBC with Differential/Platelet -  Lipid panel -     HIV Antibody (routine testing w rflx) -     Hepatitis C antibody -     Thyroid Panel With TSH -     Bayer DCA Hb A1c Waived -     Cytology - PAP([place]) -     MM 3D SCREEN BREAST BILATERAL  Encounter for screening mammogram for malignant neoplasm of breast -     MM 3D SCREEN BREAST BILATERAL  Need for hepatitis C screening test -     Hepatitis C antibody  Screening for HIV without presence of risk factors -     HIV Antibody (routine testing w rflx)  Screening for cervical cancer -     MM 3D SCREEN BREAST BILATERAL     Continue all other maintenance medications.  Follow up plan: Return in about 3 months (around 01/13/2022), or if symptoms worsen or fail to improve, for DM.   Continue healthy lifestyle choices, including diet (rich in fruits, vegetables, and lean proteins, and low in salt and simple carbohydrates) and exercise (at least 30 minutes of moderate physical activity daily).  Educational handout given for health maintenance   The above assessment and management plan was discussed with the patient. The patient verbalized understanding of and has agreed to the management plan. Patient is aware to call the clinic if they develop any new symptoms or if symptoms persist or worsen. Patient is aware when to return to the clinic for a follow-up visit. Patient educated on when it is appropriate  to go to the emergency department.   Monia Pouch, FNP-C [facility] [phone]

## 2021-10-14 LAB — HIV ANTIBODY (ROUTINE TESTING W REFLEX): HIV Screen 4th Generation wRfx: NONREACTIVE

## 2021-10-14 LAB — CMP14+EGFR
ALT: 27 IU/L (ref 0–32)
AST: 24 IU/L (ref 0–40)
Albumin/Globulin Ratio: 2.4 — ABNORMAL HIGH (ref 1.2–2.2)
Albumin: 4.5 g/dL (ref 3.8–4.9)
Alkaline Phosphatase: 56 IU/L (ref 44–121)
BUN/Creatinine Ratio: 20 (ref 9–23)
BUN: 13 mg/dL (ref 6–24)
Bilirubin Total: 0.3 mg/dL (ref 0.0–1.2)
CO2: 26 mmol/L (ref 20–29)
Calcium: 9.5 mg/dL (ref 8.7–10.2)
Chloride: 101 mmol/L (ref 96–106)
Creatinine, Ser: 0.65 mg/dL (ref 0.57–1.00)
Globulin, Total: 1.9 g/dL (ref 1.5–4.5)
Glucose: 145 mg/dL — ABNORMAL HIGH (ref 70–99)
Potassium: 4.9 mmol/L (ref 3.5–5.2)
Sodium: 140 mmol/L (ref 134–144)
Total Protein: 6.4 g/dL (ref 6.0–8.5)
eGFR: 104 mL/min/{1.73_m2} (ref >59)

## 2021-10-14 LAB — CBC WITH DIFFERENTIAL/PLATELET
Basophils Absolute: 0.1 10*3/uL (ref 0.0–0.2)
Basos: 1 %
EOS (ABSOLUTE): 0.2 10*3/uL (ref 0.0–0.4)
Eos: 3 %
Hematocrit: 37.7 % (ref 34.0–46.6)
Hemoglobin: 12.7 g/dL (ref 11.1–15.9)
Immature Grans (Abs): 0 10*3/uL (ref 0.0–0.1)
Immature Granulocytes: 0 %
Lymphocytes Absolute: 1.7 10*3/uL (ref 0.7–3.1)
Lymphs: 27 %
MCH: 30.7 pg (ref 26.6–33.0)
MCHC: 33.7 g/dL (ref 31.5–35.7)
MCV: 91 fL (ref 79–97)
Monocytes Absolute: 0.5 10*3/uL (ref 0.1–0.9)
Monocytes: 8 %
Neutrophils Absolute: 3.9 10*3/uL (ref 1.4–7.0)
Neutrophils: 61 %
Platelets: 323 10*3/uL (ref 150–450)
RBC: 4.14 x10E6/uL (ref 3.77–5.28)
RDW: 12.4 % (ref 11.7–15.4)
WBC: 6.3 10*3/uL (ref 3.4–10.8)

## 2021-10-14 LAB — LIPID PANEL
Chol/HDL Ratio: 2.7 ratio (ref 0.0–4.4)
Cholesterol, Total: 114 mg/dL (ref 100–199)
HDL: 42 mg/dL (ref >39)
LDL Chol Calc (NIH): 56 mg/dL (ref 0–99)
Triglycerides: 80 mg/dL (ref 0–149)
VLDL Cholesterol Cal: 16 mg/dL (ref 5–40)

## 2021-10-14 LAB — THYROID PANEL WITH TSH
Free Thyroxine Index: 1.7 (ref 1.2–4.9)
T3 Uptake Ratio: 28 % (ref 24–39)
T4, Total: 6 ug/dL (ref 4.5–12.0)
TSH: 0.813 u[IU]/mL (ref 0.450–4.500)

## 2021-10-14 LAB — HEPATITIS C ANTIBODY: Hep C Virus Ab: NONREACTIVE

## 2021-10-15 ENCOUNTER — Encounter: Payer: Self-pay | Admitting: Family Medicine

## 2021-10-16 NOTE — Telephone Encounter (Signed)
I spoke to the patient. Reports worsening muscle spasms, 0ccurring daily despite medications. She confirmed taking baclofen '10mg'$ , two tabs TID and tizanidine '2mg'$ , one tab TID. She was last evaluated seen in our office in 07/2020. Per vo by Dr. Krista Blue, move her appt w/ NP to earlier date. She was placed in an opening with Debbora Presto, NP on 10/19/21.

## 2021-10-18 NOTE — Patient Instructions (Incomplete)
Below is our plan:  We will continue Ocrevus every 6 months. We will continue baclofen 20mg  three times daily. I will cyclobenzaprine 5mg  up to twice daily. Stop tizanidine. Continue gabapentin 600mg  three times daily.   I do not recommend stimulant medicaitons to treat fatigue. I would encourage you to focus on sleep hygiene and making sue to eat regular well balanced meals.   Please make sure you are staying well hydrated. I recommend 50-60 ounces daily. Well balanced diet and regular exercise encouraged. Consistent sleep schedule with 6-8 hours recommended.   Please continue follow up with care team as directed.   Follow up with Dr Terrace Arabia in 3-4 months   You may receive a survey regarding today's visit. I encourage you to leave honest feed back as I do use this information to improve patient care. Thank you for seeing me today!

## 2021-10-18 NOTE — Progress Notes (Unsigned)
No chief complaint on file.   HISTORY OF PRESENT ILLNESS:  10/18/21 ALL:  Kaylee Bryant is a 56 y.o. female here today for follow up for  RRMS. She was last seen by Dr Krista Blue 04/2020. She continues Ocrevuc infusions. Last infusion   She has complaints of worsening muscle spasms over the past   She is taking baclofen '20mg'$  TID, tizanidine '2mg'$  TID and using a TENS unit and OTC muscle rubs.   She is also taking gabapentin '600mg'$  TID for   She called the office 08/15/2021 reporting sudden onset of dizziness, right veering gait, right sided blurred vision, generalized weakness and slurred speech. She was advised to proceed to the ER in which shs did but per ER notes left before having MRI due to pain. She returned to the ER 08/17/2021 for MRI. MRI findings were unremarkable. No new demyelination.    HISTORY (copied from Dr Rhea Belton previous note)  She was diagnosed with RRMS in 2007, 6 month post partum, she had numbness from waist done, diagnosis was confirmed by marked abnormal MRI brain, in March,2007. There were multiple T2/Flair periventricular lesions, corpus callosum involvement, mutiple ring enhancing lesions. Also had enhancing lesion in cervical and thoracic spines. She was treated at [facility] by Dr. Dellis Filbert, no csf study, but had extensive lab evaluations to rule out mimics. 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 She denies visual changes or other clear relapsing episodes.    MRI brain  in 01/2007, multiple T2/FLAIR lesions in both hemisphere with corpus callosum involvement, there are slight increase in lesion load. MRI cervical, stable lesions in C6-7, and C2-3. Thoracic, multiple non enhancing lesions.    She tolerated Rebif well, no flu like illness, but  complains of bilateral arm, and the lateral thigh lipoatrophy with repeat Rebif injection. There was no clear relapsed over the years, but she has slow worsening gait difficulty over the past 6 months.  MRI in Sept 2012, MRI of thoracic spine Multiple chronic plaques at C7, T6-7, T8-9, T10.   MRI of cervical spine ,Multiple chronic demyelinating plaques at C3, C4 and C7. MRI of brain Multiple round and ovioid, periventricular and subcortical chronic demyelinating plaques. 2 plaques noted in the pons. No abnormal lesions are seen on post contrast views.  Her JC-virus antibody was negative. After discuss, we started her on Tysarbri infusion since February of 2013. She tolerated the infusion very well, but complains of excessive fatigue over the past few months, she still able to work full-time as a Marine scientist, she complains of difficulty dragging herself out of bed. Modafinil was not approved by insurance.She was put on Ritalin '10mg'$  tid, which helped her fatigue. feels much better on Tysabri.   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, with short staffed, she has to work extra hours, does complains of fatigue, on polypharmacy treatment, including Effexor XR 75 mg daily, baclofen 10 mg 2 tablets 3 times a day, tizanidine 2 mg 3 times a day, also on gabapentin 600  mg 3 times a day   Recently she has intermittent sharp radiating pain at right face from the right forehead to break in the eye corner, to below cheek area, gabapentin provide limited help,   Last MRI of the brain was in November 2020, personally reviewed the films, MRI of the brain multiple round and oval periventricular, subcortical, pontine chronic  demyelinating plaques, no acute abnormality, no change from MRI compared with January 2019, MRI of cervical and thoracic spine showed chronic demyelinating plaque at C2, C3, 4, and C6- C7, T6-7, T9, T10, T12 and L1, no enhancement   REVIEW OF SYSTEMS: Out of a complete 14 system review of symptoms, the patient complains only of the following symptoms, and all other reviewed systems are negative.   ALLERGIES: No Known Allergies   HOME MEDICATIONS: Outpatient Medications Prior to Visit  Medication Sig Dispense Refill   acetaminophen (TYLENOL) 500 MG tablet Take 1,000 mg by mouth every 6 (six) hours as needed for mild pain.     aspirin EC 81 MG tablet Take 81 mg by mouth daily.     atorvastatin (LIPITOR) 80 MG tablet TAKE 1 TABLET BY MOUTH DAILY AT 6 PM. SCHEDULE OFFICE VISIT FOR FUTURE REFILLS. 30 tablet 0   baclofen (LIORESAL) 10 MG tablet TAKE 2 TABLETS BY MOUTH 3 TIMES DAILY. 180 tablet 11   Continuous Blood Gluc Receiver (FREESTYLE LIBRE 2 READER) DEVI 1 Device by Does not apply route 4 (four) times daily. 1 each 0   Continuous Blood Gluc Sensor (FREESTYLE LIBRE 2 SENSOR) MISC 1 Device by Does not apply route 4 (four) times daily. Dx E11.65 2 each 6   diclofenac (VOLTAREN) 75 MG EC tablet Take by mouth.     docusate sodium (COLACE) 100 MG capsule      gabapentin (NEURONTIN) 300 MG capsule Take 2 capsules (600 mg total) by mouth 3 (three) times daily. Please keep upcoming appt for continued refills 540 capsule 0   lisinopril (ZESTRIL) 10 MG tablet TAKE 1 TABLET (10 MG TOTAL) IN THE MORNING AND AT BEDTIME. SCHEDULE OFFICE VISIT FOR FUTURE REFILLS. 60 tablet 0   metFORMIN (GLUCOPHAGE) 500 MG tablet Take 1 tablet (500 mg total) by mouth 2 (two) times daily with a meal. 180 tablet 3   metoprolol tartrate (LOPRESSOR) 25 MG tablet TAKE 1 TABLET (25 MG TOTAL) BY MOUTH 2 (TWO) TIMES DAILY. SCHEDULE OFFICE VISIT FOR FUTURE REFILLS 60 tablet 0   ocrelizumab (OCREVUS) 300 MG/10ML injection Inject 20  mLs (600 mg total) into the vein every 6 (six) months. 20 mL 0   Semaglutide, 1 MG/DOSE, 4 MG/3ML SOPN Inject 1 mg as directed once a week. 3 mL 3   tiZANidine (ZANAFLEX) 2 MG tablet Take 1 tablet (2 mg total) by mouth 3 (three) times daily. Please keep upcoming appt for continued refills 270 tablet 0   venlafaxine XR (EFFEXOR-XR) 150 MG 24 hr capsule Take 1 capsule (150 mg total) by mouth daily with breakfast. 90 capsule 3   venlafaxine XR (EFFEXOR-XR) 75 MG 24 hr capsule Take 1 capsule (75 mg total) by mouth daily with breakfast. 90 capsule 3   No facility-administered medications prior to visit.     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   LIMA to LAD off pump     PAST SURGICAL HISTORY: Past Surgical History:  Procedure Laterality Date   CARDIAC CATHETERIZATION N/A 04/05/2015   Procedure: Left Heart Cath and Coronary Angiography;  Surgeon: Troy Sine, MD;  Location: [facility];  Service: Cardiovascular;  Laterality: N/A;   CERVICAL CONE BIOPSY  2004   CESAREAN SECTION     1996, 2006   CHOLECYSTECTOMY     1988   COLPOSCOPY  2004   CORONARY ARTERY BYPASS GRAFT N/A 04/07/2015   Procedure: CORONARY ARTERY BYPASS GRAFTING (CABG), OFF PUMP, TIMES ONE, USING LEFT INTERNAL MAMMARY ARTERY;  Surgeon: Rexene Alberts, MD;  Location: [facility];  Service: Open Heart Surgery;  Laterality: N/A;  LIMA to LAD   TEE WITHOUT CARDIOVERSION N/A 04/07/2015   Procedure: TRANSESOPHAGEAL ECHOCARDIOGRAM (TEE);  Surgeon: Rexene Alberts, MD;  Location: [facility];   Service: Open Heart Surgery;  Laterality: N/A;     FAMILY HISTORY: Family History  Problem Relation Age of Onset   Diabetes Mother    Hypertension Mother    Hypertension Father    Atrial fibrillation Father    Diabetes Sister    Breast cancer Maternal Aunt    Arrhythmia Sister    Heart attack Maternal Grandfather    Stroke Neg Hx      SOCIAL HISTORY: Social History   Socioeconomic History   Marital status: Married    Spouse name: Not on file   Number of children: 4   Years of education: college   Highest education level: Not on file  Occupational History   Occupation: Programmer, multimedia: [employer]  Tobacco Use   Smoking status: Former    Packs/day: 1.50    Years: 15.00    Total pack years: 22.50    Types: Cigarettes    Quit date: 02/03/2004    Years since quitting: 17.7   Smokeless tobacco: Never  Substance and Sexual Activity   Alcohol use: Yes    Alcohol/week: 1.0 standard drink of alcohol    Types: 1 Glasses of wine per week   Drug use: No   Sexual activity: Yes    Partners: Male    Birth control/protection: Surgical    Comment: vasectomy  Other Topics Concern   Not on file  Social History Narrative   Patient lives at home with her husband and children. Patient works for the [employer] in [place]. College education.    Social Determinants of Health   Financial Resource Strain: Not on file  Food Insecurity: Not on file  Transportation Needs: Not on file  Physical Activity: Not on file  Stress: Not on file  Social Connections: Not on file  Intimate Partner Violence: Not on file     PHYSICAL EXAM  There were no vitals filed for this visit. There is no height or weight on file to calculate BMI.  Generalized: Well developed, in no acute distress  Cardiology: normal rate and rhythm, no murmur auscultated  Respiratory: clear to auscultation bilaterally    Neurological examination  Mentation: Alert oriented to time, place, history  taking. Follows all commands speech and language fluent Cranial nerve II-XII: Pupils were equal round reactive to  light. Extraocular movements were full, visual field were full on confrontational test. Facial sensation and strength were normal. Uvula tongue midline. Head turning and shoulder shrug  were normal and symmetric. Motor: The motor testing reveals 5 over 5 strength of all 4 extremities. Good symmetric motor tone is noted throughout.  Sensory: Sensory testing is intact to soft touch on all 4 extremities. No evidence of extinction is noted.  Coordination: Cerebellar testing reveals good finger-nose-finger and heel-to-shin bilaterally.  Gait and station: Gait is normal. Tandem gait is normal. Romberg is negative. No drift is seen.  Reflexes: Deep tendon reflexes are symmetric and normal bilaterally.    DIAGNOSTIC DATA (LABS, IMAGING, TESTING) - I reviewed patient records, labs, notes, testing and imaging myself where available.  Lab Results  Component Value Date   WBC 6.3 10/13/2021   HGB 12.7 10/13/2021   HCT 37.7 10/13/2021   MCV 91 10/13/2021   PLT 323 10/13/2021      Component Value Date/Time   NA 140 10/13/2021 0847   K 4.9 10/13/2021 0847   CL 101 10/13/2021 0847   CO2 26 10/13/2021 0847   GLUCOSE 145 (H) 10/13/2021 0847   GLUCOSE 216 (H) 08/17/2021 1120   BUN 13 10/13/2021 0847   CREATININE 0.65 10/13/2021 0847   CREATININE 0.65 06/02/2013 1640   CALCIUM 9.5 10/13/2021 0847   PROT 6.4 10/13/2021 0847   ALBUMIN 4.5 10/13/2021 0847   AST 24 10/13/2021 0847   ALT 27 10/13/2021 0847   ALKPHOS 56 10/13/2021 0847   BILITOT 0.3 10/13/2021 0847   GFRNONAA >60 08/15/2021 1635   GFRAA 100 05/02/2020 0809   Lab Results  Component Value Date   CHOL 114 10/13/2021   HDL 42 10/13/2021   LDLCALC 56 10/13/2021   TRIG 80 10/13/2021   CHOLHDL 2.7 10/13/2021   Lab Results  Component Value Date   HGBA1C 6.5 (H) 10/13/2021   Lab Results  Component Value Date    DUKGURKY70 623 08/09/2015   Lab Results  Component Value Date   TSH 0.813 10/13/2021        No data to display               No data to display           ASSESSMENT AND PLAN  56 y.o. year old female  has a past medical history of Abnormal Pap smear (2004), Coronary artery disease involving native coronary artery with unstable angina pectoris ([place]) (04/05/2015), DDD (degenerative disc disease), lumbar, DM (diabetes mellitus), gestational (2006), Fatigue, Heart attack ([place]), IBS (irritable bowel syndrome), Multiple sclerosis ([place]), Obesity, and S/P CABG x 1 (04/07/2015). here with    No diagnosis found.  Nyilah A Salada ***.  Healthy lifestyle habits encouraged. *** will follow up with PCP as directed. *** will return to see me in ***, sooner if needed. *** verbalizes understanding and agreement with this plan.   No orders of the defined types were placed in this encounter.    No orders of the defined types were placed in this encounter.    Debbora Presto, MSN, FNP-C 10/18/2021, 4:26 PM  [facility] [address], [place],  [address] [phone]

## 2021-10-19 ENCOUNTER — Ambulatory Visit (INDEPENDENT_AMBULATORY_CARE_PROVIDER_SITE_OTHER): Payer: Managed Care, Other (non HMO) | Admitting: Family Medicine

## 2021-10-19 ENCOUNTER — Encounter: Payer: Self-pay | Admitting: Family Medicine

## 2021-10-19 VITALS — BP 127/71 | HR 72 | Ht 63.0 in | Wt 164.0 lb

## 2021-10-19 DIAGNOSIS — E559 Vitamin D deficiency, unspecified: Secondary | ICD-10-CM

## 2021-10-19 DIAGNOSIS — R252 Cramp and spasm: Secondary | ICD-10-CM

## 2021-10-19 DIAGNOSIS — R5382 Chronic fatigue, unspecified: Secondary | ICD-10-CM

## 2021-10-19 DIAGNOSIS — G35 Multiple sclerosis: Secondary | ICD-10-CM

## 2021-10-19 MED ORDER — CYCLOBENZAPRINE HCL 5 MG PO TABS: 5.0000 mg | ORAL_TABLET | Freq: Two times a day (BID) | ORAL | 1 refills | Status: DC

## 2021-10-20 LAB — CYTOLOGY - PAP
Adequacy: ABSENT
Comment: NEGATIVE
Diagnosis: NEGATIVE
High risk HPV: NEGATIVE

## 2021-10-20 LAB — CK TOTAL AND CKMB (NOT AT ARMC)
CK-MB Index: 3.6 ng/mL (ref 0.0–5.3)
Total CK: 194 U/L — ABNORMAL HIGH (ref 32–182)

## 2021-10-20 LAB — IGG, IGA, IGM
IgA/Immunoglobulin A, Serum: 189 mg/dL (ref 87–352)
IgG (Immunoglobin G), Serum: 780 mg/dL (ref 586–1602)
IgM (Immunoglobulin M), Srm: 9 mg/dL — ABNORMAL LOW (ref 26–217)

## 2021-10-20 LAB — MAGNESIUM: Magnesium: 2.3 mg/dL (ref 1.6–2.3)

## 2021-10-20 LAB — VITAMIN D 25 HYDROXY (VIT D DEFICIENCY, FRACTURES): Vit D, 25-Hydroxy: 53.1 ng/mL (ref 30.0–100.0)

## 2021-10-24 ENCOUNTER — Other Ambulatory Visit: Payer: Self-pay | Admitting: Cardiovascular Disease

## 2021-11-07 ENCOUNTER — Other Ambulatory Visit: Payer: Self-pay | Admitting: Cardiovascular Disease

## 2021-11-15 ENCOUNTER — Ambulatory Visit: Payer: Managed Care, Other (non HMO) | Admitting: Family Medicine

## 2021-11-23 ENCOUNTER — Other Ambulatory Visit: Payer: Self-pay | Admitting: Family Medicine

## 2021-11-23 DIAGNOSIS — I152 Hypertension secondary to endocrine disorders: Secondary | ICD-10-CM

## 2021-11-23 DIAGNOSIS — E1165 Type 2 diabetes mellitus with hyperglycemia: Secondary | ICD-10-CM

## 2021-11-23 DIAGNOSIS — E1169 Type 2 diabetes mellitus with other specified complication: Secondary | ICD-10-CM

## 2021-11-23 DIAGNOSIS — Z951 Presence of aortocoronary bypass graft: Secondary | ICD-10-CM

## 2021-11-23 DIAGNOSIS — I2510 Atherosclerotic heart disease of native coronary artery without angina pectoris: Secondary | ICD-10-CM

## 2021-11-24 NOTE — Telephone Encounter (Signed)
Which dose is appropriate ?

## 2021-11-29 ENCOUNTER — Ambulatory Visit
Admission: RE | Admit: 2021-11-29 | Discharge: 2021-11-29 | Disposition: A | Discharge status: Home / Self Care | Payer: Managed Care, Other (non HMO) | Source: Ambulatory Visit

## 2021-12-21 ENCOUNTER — Other Ambulatory Visit: Payer: Self-pay | Admitting: Neurology

## 2021-12-21 NOTE — Telephone Encounter (Signed)
Rx sent 

## 2021-12-25 ENCOUNTER — Other Ambulatory Visit: Payer: Self-pay | Admitting: Family Medicine

## 2021-12-25 DIAGNOSIS — E1165 Type 2 diabetes mellitus with hyperglycemia: Secondary | ICD-10-CM

## 2022-01-02 ENCOUNTER — Other Ambulatory Visit: Payer: Self-pay | Admitting: Cardiovascular Disease

## 2022-01-08 ENCOUNTER — Telehealth: Payer: Self-pay

## 2022-01-08 NOTE — Telephone Encounter (Signed)
Ozempic PA phone encounter not started  Kaylee Bryant (Key: [id number]) Rx #: [id number] Ozempic (1 MG/DOSE) '4MG'$ /3ML pen-injectors  Message from Plan This request has been approved using information available on the patient's profile. [id number]:[id number];Review Type:Prior Auth;Coverage Start Date:12/04/2021;Coverage End Date:01/03/2023;    Pharmacy aware Via VM

## 2022-01-13 ENCOUNTER — Other Ambulatory Visit: Payer: Self-pay | Admitting: Cardiovascular Disease

## 2022-01-16 ENCOUNTER — Ambulatory Visit (INDEPENDENT_AMBULATORY_CARE_PROVIDER_SITE_OTHER): Payer: Managed Care, Other (non HMO) | Admitting: Family Medicine

## 2022-01-16 ENCOUNTER — Encounter: Payer: Self-pay | Admitting: Family Medicine

## 2022-01-16 VITALS — BP 118/76 | HR 71 | Temp 97.2°F | Ht 63.0 in | Wt 170.0 lb

## 2022-01-16 DIAGNOSIS — I152 Hypertension secondary to endocrine disorders: Secondary | ICD-10-CM

## 2022-01-16 DIAGNOSIS — E1159 Type 2 diabetes mellitus with other circulatory complications: Secondary | ICD-10-CM | POA: Diagnosis not present

## 2022-01-16 DIAGNOSIS — E1169 Type 2 diabetes mellitus with other specified complication: Secondary | ICD-10-CM | POA: Diagnosis not present

## 2022-01-16 LAB — BAYER DCA HB A1C WAIVED: HB A1C (BAYER DCA - WAIVED): 6.7 % — ABNORMAL HIGH (ref 4.8–5.6)

## 2022-01-16 NOTE — Patient Instructions (Addendum)

## 2022-01-16 NOTE — Progress Notes (Signed)
Subjective:  Patient ID: Kaylee Bryant, female    DOB: 10/29/1965, 56 y.o.   MRN: [id number]  Patient Care Team: Baruch Gouty, FNP as PCP - General (Family Medicine)   Chief Complaint:  Medical Management of Chronic Issues and Diabetes   HPI: Kaylee Bryant is a 56 y.o. female presenting on 01/16/2022 for Medical Management of Chronic Issues and Diabetes   1. Type 2 diabetes mellitus with other specified complication, without long-term current use of insulin (HCC) Currently on ozempic and metformin, tolerating well. States blood sugars have been running normal. No adverse side effects from medications. Is due for eye exam and is aware to make appointment. No polyuria, polyphagia, or polydipsia.   2. Hypertension associated with type 2 diabetes mellitus ([place]) On lisinopril and tolerating well. Denies headaches, visual changes, chest pain, palpitations, leg swelling, weakness, dizziness, or confusion.   3. Morbid obesity ([place]) Has been doing well with diet, exercise, and Ozempic. Does have some evening snacking but is working on trying to quit this.      Relevant past medical, surgical, family, and social history reviewed and updated as indicated.  Allergies and medications reviewed and updated. Data reviewed: Chart in Epic.   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   LIMA to LAD off pump    Past Surgical History:  Procedure Laterality Date   CARDIAC CATHETERIZATION N/A 04/05/2015   Procedure: Left Heart Cath and Coronary Angiography;  Surgeon: Troy Sine,  MD;  Location: [facility];  Service: Cardiovascular;  Laterality: N/A;   CERVICAL CONE BIOPSY  2004   CESAREAN SECTION     1996, 2006   CHOLECYSTECTOMY     1988   COLPOSCOPY  2004   CORONARY ARTERY BYPASS GRAFT N/A 04/07/2015   Procedure: CORONARY ARTERY BYPASS GRAFTING (CABG), OFF PUMP, TIMES ONE, USING LEFT INTERNAL MAMMARY ARTERY;  Surgeon: Rexene Alberts, MD;  Location: [facility];  Service: Open Heart Surgery;  Laterality: N/A;  LIMA to LAD   TEE WITHOUT CARDIOVERSION N/A 04/07/2015   Procedure: TRANSESOPHAGEAL ECHOCARDIOGRAM (TEE);  Surgeon: Rexene Alberts, MD;  Location: [facility];  Service: Open Heart Surgery;  Laterality: N/A;    Social History   Socioeconomic History   Marital status: Married    Spouse name: Not on file   Number of children: 4   Years of education: college   Highest education level: Not on file  Occupational History   Occupation: Programmer, multimedia: [employer]  Tobacco Use   Smoking status: Former    Packs/day: 1.50    Years: 15.00    Total pack years: 22.50    Types: Cigarettes    Quit date: 02/03/2004    Years since quitting: 17.9   Smokeless tobacco: Never  Substance and Sexual Activity   Alcohol use: Yes    Alcohol/week: 1.0 standard drink of alcohol    Types: 1 Glasses of wine per week  Drug use: No   Sexual activity: Yes    Partners: Male    Birth control/protection: Surgical    Comment: vasectomy  Other Topics Concern   Not on file  Social History Narrative   Patient lives at home with her husband and children. Patient works for the [employer] in [place]. College education.    Social Determinants of Health   Financial Resource Strain: Not on file  Food Insecurity: Not on file  Transportation Needs: Not on file  Physical Activity: Not on file  Stress: Not on file  Social Connections: Not on file  Intimate Partner Violence: Not on file    Outpatient Encounter Medications as of 01/16/2022  Medication Sig    acetaminophen (TYLENOL) 500 MG tablet Take 1,000 mg by mouth every 6 (six) hours as needed for mild pain.   aspirin EC 81 MG tablet Take 81 mg by mouth daily.   atorvastatin (LIPITOR) 80 MG tablet TAKE 1 TABLET BY MOUTH DAILY AT 6 PM. Please contact the office to schedule appointment for additional refills. 2ed Attempt.   baclofen (LIORESAL) 10 MG tablet TAKE 2 TABLETS BY MOUTH 3 TIMES DAILY.   Continuous Blood Gluc Receiver (FREESTYLE LIBRE 2 READER) DEVI 1 Device by Does not apply route 4 (four) times daily.   Continuous Blood Gluc Sensor (FREESTYLE LIBRE 2 SENSOR) MISC USE TO CHECK SUGAR 4 (FOUR) TIMES DAILY. DX E11.65   cyclobenzaprine (FLEXERIL) 5 MG tablet Take 1 tablet (5 mg total) by mouth 2 (two) times daily.   gabapentin (NEURONTIN) 300 MG capsule TAKE 2 CAPSULES (600 MG TOTAL) 3 TIMES DAILY. PLEASE KEEP UPCOMING APPT FOR CONTINUED REFILLS   lisinopril (ZESTRIL) 10 MG tablet Take 1 tablet (10 mg total) by mouth 2 (two) times daily. Please contact the office to schedule appointment for additional refills. 2ed Attempt.   metFORMIN (GLUCOPHAGE) 500 MG tablet Take 1 tablet (500 mg total) by mouth 2 (two) times daily with a meal.   metoprolol tartrate (LOPRESSOR) 25 MG tablet Take 1 tablet (25 mg total) by mouth 2 (two) times daily. Please contact the office to schedule appointment for additional refills. 2ed Attempt.   ocrelizumab (OCREVUS) 300 MG/10ML injection Inject 20 mLs (600 mg total) into the vein every 6 (six) months.   OZEMPIC, 1 MG/DOSE, 4 MG/3ML SOPN INJECT 1 MG ONCE A WEEK AS DIRECTED   venlafaxine XR (EFFEXOR-XR) 150 MG 24 hr capsule Take 1 capsule (150 mg total) by mouth daily with breakfast.   venlafaxine XR (EFFEXOR-XR) 75 MG 24 hr capsule Take 1 capsule (75 mg total) by mouth daily with breakfast.   No facility-administered encounter medications on file as of 01/16/2022.    No Known Allergies  Review of Systems  Constitutional:  Negative for activity change, appetite  change, chills and fever.  HENT: Negative.    Eyes: Negative.  Negative for photophobia and visual disturbance.  Respiratory:  Negative for cough, chest tightness and shortness of breath.   Cardiovascular:  Negative for palpitations and leg swelling.  Gastrointestinal:  Negative for abdominal pain, blood in stool, constipation, diarrhea, nausea and vomiting.  Endocrine: Negative.   Genitourinary:  Negative for decreased urine volume, difficulty urinating, dysuria, frequency and urgency.  Musculoskeletal:  Negative for arthralgias and myalgias.  Skin: Negative.   Allergic/Immunologic: Negative.   Neurological:  Negative for syncope, facial asymmetry, light-headedness and numbness.  Hematological: Negative.   Psychiatric/Behavioral:  Negative for hallucinations, sleep disturbance and suicidal ideas.   All other systems reviewed and  are negative.       Objective:  BP 118/76   Pulse 71   Temp (!) 97.2 F (36.2 C)   Ht '5\' 3"'$  (1.6 m)   Wt 170 lb (77.1 kg)   LMP 10/03/2014 Comment: last period was approx 6 months ago, "i don't have it anymore"  SpO2 100%   BMI 30.11 kg/m    Wt Readings from Last 3 Encounters:  01/16/22 170 lb (77.1 kg)  10/19/21 164 lb (74.4 kg)  10/13/21 167 lb (75.8 kg)    Physical Exam Vitals and nursing note reviewed.  Constitutional:      General: She is not in acute distress.    Appearance: Normal appearance. She is well-developed and well-groomed. She is not ill-appearing, toxic-appearing or diaphoretic.  HENT:     Head: Normocephalic and atraumatic.     Jaw: There is normal jaw occlusion.     Right Ear: Hearing normal.     Left Ear: Hearing normal.     Nose: Nose normal.     Mouth/Throat:     Lips: Pink.     Mouth: Mucous membranes are moist.     Pharynx: Uvula midline.  Eyes:     General: Lids are normal.     Pupils: Pupils are equal, round, and reactive to light.  Neck:     Thyroid: No thyroid mass, thyromegaly or thyroid tenderness.      Vascular: No carotid bruit or JVD.     Trachea: Trachea and phonation normal.  Cardiovascular:     Rate and Rhythm: Normal rate and regular rhythm.     Chest Wall: PMI is not displaced.     Pulses: Normal pulses.     Heart sounds: Normal heart sounds. No murmur heard.    No friction rub. No gallop.  Pulmonary:     Effort: Pulmonary effort is normal. No respiratory distress.     Breath sounds: Normal breath sounds. No wheezing.  Abdominal:     General: There is no abdominal bruit.     Palpations: There is no hepatomegaly or splenomegaly.  Musculoskeletal:        General: Normal range of motion.     Cervical back: Normal range of motion and neck supple.     Right lower leg: No edema.     Left lower leg: No edema.  Lymphadenopathy:     Cervical: No cervical adenopathy.  Skin:    General: Skin is warm and dry.     Capillary Refill: Capillary refill takes less than 2 seconds.     Coloration: Skin is not cyanotic, jaundiced or pale.     Findings: No rash.  Neurological:     General: No focal deficit present.     Mental Status: She is alert and oriented to person, place, and time.     Sensory: Sensation is intact.     Motor: Motor function is intact.     Coordination: Coordination is intact.     Gait: Gait is intact.     Deep Tendon Reflexes: Reflexes are normal and symmetric.  Psychiatric:        Attention and Perception: Attention and perception normal.        Mood and Affect: Mood and affect normal.        Speech: Speech normal.        Behavior: Behavior normal. Behavior is cooperative.        Thought Content: Thought content normal.  Cognition and Memory: Cognition and memory normal.        Judgment: Judgment normal.     Results for orders placed or performed in visit on 10/19/21  CK Total and CKMB  Result Value Ref Range   Total CK 194 (H) 32 - 182 U/L   CK-MB Index 3.6 0.0 - 5.3 ng/mL  IgG, IgA, IgM  Result Value Ref Range   IgG (Immunoglobin G), Serum 780 586  - 1,602 mg/dL   IgA/Immunoglobulin A, Serum 189 87 - 352 mg/dL   IgM (Immunoglobulin M), Srm 9 (L) 26 - 217 mg/dL  Magnesium  Result Value Ref Range   Magnesium 2.3 1.6 - 2.3 mg/dL  Vitamin D, 25-hydroxy  Result Value Ref Range   Vit D, 25-Hydroxy 53.1 30.0 - 100.0 ng/mL       Pertinent labs & imaging results that were available during my care of the patient were reviewed by me and considered in my medical decision making.  Assessment & Plan:  Kaylee Bryant was seen today for medical management of chronic issues and diabetes.  Diagnoses and all orders for this visit:  Type 2 diabetes mellitus with other specified complication, without long-term current use of insulin (HCC) A1C 6.7 today, well controlled. Continue current regimen. Aware to have diabetic eye exam completed.  -     Bayer DCA Hb A1c Waived -     Microalbumin / creatinine urine ratio  Hypertension associated with type 2 diabetes mellitus ([place]) Well controlled on current regimen, will continue. DASH diet and exercise encouraged.   Morbid obesity ([place]) Has been doing very well with Ozempic for diabetes management. Starting BMI 35, now 30. Continue diet and exercise along with medications.     Continue all other maintenance medications.  Follow up plan: Return in about 3 months (around 04/18/2022), or if symptoms worsen or fail to improve, for DM.   Continue healthy lifestyle choices, including diet (rich in fruits, vegetables, and lean proteins, and low in salt and simple carbohydrates) and exercise (at least 30 minutes of moderate physical activity daily).  Educational handout given for DM  The above assessment and management plan was discussed with the patient. The patient verbalized understanding of and has agreed to the management plan. Patient is aware to call the clinic if they develop any new symptoms or if symptoms persist or worsen. Patient is aware when to return to the clinic for a follow-up visit. Patient educated  on when it is appropriate to go to the emergency department.   Monia Pouch, FNP-C [facility] [phone]

## 2022-01-18 LAB — MICROALBUMIN / CREATININE URINE RATIO
Creatinine, Urine: 45.6 mg/dL
Microalb/Creat Ratio: <7 mg/g creat (ref 0–29)
Microalbumin, Urine: <3 ug/mL

## 2022-02-08 ENCOUNTER — Telehealth: Payer: Self-pay | Admitting: Cardiovascular Disease

## 2022-02-08 NOTE — Telephone Encounter (Signed)
*  STAT* If patient is at the pharmacy, call can be transferred to refill team.   1. Which medications need to be refilled? (please list name of each medication and dose if known) Metoprolol ans Lisinopril  2. Which pharmacy/location (including street and city if local pharmacy) is medication to be sent to? [facility] [place]  3. Do they need a 30 day or 90 day supply?  30 days#v60 for Metoprolol and 30 days # 60 for Lisnopril

## 2022-02-09 MED ORDER — METOPROLOL TARTRATE 25 MG PO TABS: 25.0000 mg | ORAL_TABLET | Freq: Two times a day (BID) | ORAL | 0 refills | Status: DC

## 2022-02-09 MED ORDER — LISINOPRIL 10 MG PO TABS: 10.0000 mg | ORAL_TABLET | Freq: Two times a day (BID) | ORAL | 0 refills | Status: DC

## 2022-02-18 ENCOUNTER — Other Ambulatory Visit: Payer: Self-pay | Admitting: Cardiovascular Disease

## 2022-02-22 ENCOUNTER — Ambulatory Visit (INDEPENDENT_AMBULATORY_CARE_PROVIDER_SITE_OTHER): Payer: Managed Care, Other (non HMO) | Admitting: Neurology

## 2022-02-22 ENCOUNTER — Encounter: Payer: Self-pay | Admitting: Neurology

## 2022-02-22 VITALS — BP 132/79 | HR 71 | Ht 63.0 in | Wt 173.5 lb

## 2022-02-22 DIAGNOSIS — R5382 Chronic fatigue, unspecified: Secondary | ICD-10-CM | POA: Diagnosis not present

## 2022-02-22 DIAGNOSIS — R269 Unspecified abnormalities of gait and mobility: Secondary | ICD-10-CM

## 2022-02-22 DIAGNOSIS — M48061 Spinal stenosis, lumbar region without neurogenic claudication: Secondary | ICD-10-CM | POA: Diagnosis not present

## 2022-02-22 DIAGNOSIS — G35 Multiple sclerosis: Secondary | ICD-10-CM

## 2022-02-22 MED ORDER — GABAPENTIN 300 MG PO CAPS: 600.0000 mg | ORAL_CAPSULE | Freq: Three times a day (TID) | ORAL | 3 refills | Status: DC

## 2022-02-22 MED ORDER — BACLOFEN 10 MG PO TABS: 10.0000 mg | ORAL_TABLET | Freq: Three times a day (TID) | ORAL | 3 refills | Status: DC

## 2022-02-22 MED ORDER — CYCLOBENZAPRINE HCL 5 MG PO TABS: 5.0000 mg | ORAL_TABLET | Freq: Two times a day (BID) | ORAL | 3 refills | Status: DC

## 2022-02-22 NOTE — Progress Notes (Signed)
Chief Complaint  Patient presents with   Follow-up    Rm 12. Alone. Reports TMJ flared up around Thanksgiving, has improved since. Pt reports nausea associated with uncontrollable sneezing recently. Denies any exacerbations.   ASSESSMENT AND PLAN  Relapsing remitting multiple sclerosis Gait abnormality Lower extremity spasticity  Diagnosed since 2007, 6 months postpartum,  MRI of the brain was abnormal, also had evidence of cervical and thoracic spine enhancing lesions,  Ribif from March 2007-2013  Tysarbri from Feb 2013-2021  Start Ocrelizumab infusion since January 2021, switch treatment due to her slow worsening gait abnormality, she likes it better, with less frequent infusion, and seems to have better control of her MS symptoms as well, no flareup since switch to ocrelizumab  Lumbar stenosis, radiating pain to left lower extremity  MRI of lumbar spine in March 2022, L 4 5, pseudodisc bulging, facet hypertrophy, right posterior bone spurring resulting severe spinal stenosis,  She improved with conservative treatment   At risk for obstructive sleep apnea  Overweight, excessive daytime fatigue, sleepiness, loud snoring  Referral to sleep study  Return To Clinic With NP In 6 Months   DIAGNOSTIC DATA (LABS, IMAGING, TESTING) - I reviewed patient records, labs, notes, testing and imaging myself where available. MRI of the brain with without contrast in May 2023: Foci of T2 hyperintensity in the periventricular greater than subcortical white matter in identified and consistent with history of multiple sclerosis. No definite new lesion. Minimal posterior fossa involvement is also unchanged. There is no abnormal enhancement. MRI thoracic spine (with and without) demonstrating: November 2020 - Chronic demyelinating plaques at C7,T6-7, T9, T10, T12-L1. - No acute plaques.  MRI cervical spine (with and without) demonstrating: November 2020 - Chronic demyelinating plaques at C2,  C3, C4, and C6-7 spinal cord; also noted in the brainstem.   JC virus titer was negative  HISTORY OF PRESENT ILLNESS: She was diagnosed with RRMS in 2007, 6 month post partum, she had numbness from waist done, diagnosis was confirmed by marked abnormal MRI brain, in March,2007. There were multiple T2/Flair periventricular lesions, corpus callosum involvement, mutiple ring enhancing lesions. Also had enhancing lesion in cervical and thoracic spines. She was treated at [facility] by Dr. Dellis Filbert, no csf study, but had extensive lab evaluations to rule out mimics. 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 She denies visual changes or other clear relapsing episodes.    MRI brain  in 01/2007, multiple T2/FLAIR lesions in both hemisphere with corpus callosum involvement, there are slight increase in lesion load. MRI cervical, stable lesions in C6-7, and C2-3. Thoracic, multiple non enhancing lesions.    She tolerated Rebif well, no flu like illness, but complains of bilateral arm, and the lateral thigh lipoatrophy with repeat Rebif injection. There was no clear relapsed over the years, but she has slow worsening gait difficulty over the past 6 months.  MRI in Sept 2012, MRI of thoracic spine Multiple chronic plaques at C7, T6-7, T8-9, T10.   MRI of cervical spine ,Multiple chronic demyelinating plaques at C3, C4 and C7. MRI of brain Multiple round and ovioid, periventricular and subcortical chronic demyelinating plaques. 2 plaques noted in the pons. No abnormal lesions are seen on post contrast views.  Her JC-virus antibody was negative. After discuss, we started  her on Tysarbri infusion since February of 2013. She tolerated the infusion very well, but complains of excessive fatigue over  the past few months, she still able to work full-time as a Marine scientist, she complains of difficulty dragging herself out of bed. Modafinil was not approved by insurance.She was put on Ritalin '10mg'$  tid, which helped her fatigue. feels much better on Tysabri.   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, with short staffed, she has to work extra hours, does complains of fatigue, on polypharmacy treatment, including Effexor XR 75 mg daily, baclofen 10 mg 2 tablets 3 times a day, tizanidine 2 mg 3 times a day, also on gabapentin 600 mg 3 times a day   Recently she has intermittent sharp radiating pain at right face from the right forehead to break in the eye corner, to below cheek area, gabapentin provide limited help,   Last MRI of the brain was in November 2020, personally reviewed the films, MRI of the brain multiple round and oval periventricular, subcortical, pontine chronic demyelinating plaques, no acute abnormality, no change from MRI compared with January 2019, MRI of cervical and thoracic spine showed chronic demyelinating plaque at C2, C3, 4, and C6- C7, T6-7, T9, T10, T12 and L1, no enhancement  Update February 22, 2022: She is overall well, weight loss program, continue to 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, low back pain has improved, but still have intermittent radiating pain to left lower extremity  She complains of fatigue, lack of stamina, do have excessive sleepiness, loud snoring,  A1c is 6.7, vitamin D 39.6, IgM was 9, normal IgG 780, CPK 194, negative HIV, hepatitis C,  Personally reviewed MRI of lumbar spine in March 2022, severe spinal stenosis L4-5 level due to pseudo disc bulging, facet  hypertrophy right posterior lateral bone spurring,  MRI of the brain with without contrast in May 2023, stable MS lesions involving periventricular, subcortical, no contrast-enhancement   REVIEW OF SYSTEMS: Out of a complete 14 system review of symptoms, the patient complains only of the following symptoms, and all other reviewed systems are negative.  PHYSICAL EXAM: Today's Vitals   02/22/22 1002  BP: 132/79  Pulse: 71  Weight: 173 lb 8 oz (78.7 kg)  Height: '5\' 3"'$  (1.6 m)   Body mass index is 30.73 kg/m.   PHYSICAL EXAMNIATION:  Gen: NAD, conversant, well nourised, well groomed                     Cardiovascular: Regular rate rhythm, no peripheral edema, warm, nontender. Eyes: Conjunctivae clear without exudates or hemorrhage Neck: Supple, no carotid bruits. Pulmonary: Clear to auscultation bilaterally   NEUROLOGICAL EXAM:  MENTAL STATUS: Speech/cognition: Awake, alert oriented to history taking and casual conversation  CRANIAL NERVES: CN II: Visual fields are full to confrontation.  Pupils are round equal and briskly reactive to light. CN III, IV, VI: extraocular movement are normal. No ptosis. CN V: Facial sensation is intact to pinprick in all 3 divisions bilaterally. Corneal responses are intact.  CN VII: Face is symmetric with normal eye closure and smile. CN VIII: Hearing is normal to casual conversation CN IX, X: Palate elevates symmetrically. Phonation is normal. CN XI:  Head turning and shoulder shrug are intact CN XII: Tongue is midline with normal movements and no atrophy.  MOTOR: There is no pronator drift of out-stretched arms. Muscle bulk and tone are normal. Muscle strength is normal.  REFLEXES: Reflexes are 2+ and symmetric at the biceps, triceps, knees, and ankles. Plantar responses are flexor.  SENSORY: Intact to light touch, pinprick, positional and vibratory sensation are intact in fingers and toes.  COORDINATION: Rapid alternating movements and  fine finger movements are intact. There is no dysmetria on finger-to-nose and heel-knee-shin.    GAIT/STANCE: Posture is normal. Gait is steady with normal steps, base, arm swing, and turning. Heel and toe walking are normal. Tandem gait is normal.  Romberg is absent.   ALLERGIES: No Known Allergies   HOME MEDICATIONS: Outpatient Medications Prior to Visit  Medication Sig Dispense Refill   acetaminophen (TYLENOL) 500 MG tablet Take 1,000 mg by mouth every 6 (six) hours as needed for mild pain.     aspirin EC 81 MG tablet Take 81 mg by mouth daily.     atorvastatin (LIPITOR) 80 MG tablet TAKE 1 TABLET BY MOUTH DAILY AT 6 PM. Please contact the office to schedule appointment for additional refills. 2ed Attempt. 15 tablet 0   baclofen (LIORESAL) 10 MG tablet TAKE 2 TABLETS BY MOUTH 3 TIMES DAILY. 180 tablet 11   Continuous Blood Gluc Receiver (FREESTYLE LIBRE 2 READER) DEVI 1 Device by Does not apply route 4 (four) times daily. 1 each 0   Continuous Blood Gluc Sensor (FREESTYLE LIBRE 2 SENSOR) MISC USE TO CHECK SUGAR 4 (FOUR) TIMES DAILY. DX E11.65 6 each 3   cyclobenzaprine (FLEXERIL) 5 MG tablet Take 1 tablet (5 mg total) by mouth 2 (two) times daily. 180 tablet 1   gabapentin (NEURONTIN) 300 MG capsule TAKE 2 CAPSULES (600 MG TOTAL) 3 TIMES DAILY. PLEASE KEEP UPCOMING APPT FOR CONTINUED REFILLS 540 capsule 0   lisinopril (ZESTRIL) 10 MG tablet Take 1 tablet (10 mg total) by mouth 2 (two) times daily. Please contact the office to schedule appointment for additional refills. 3rd Attempt. 30 tablet 0   metFORMIN (GLUCOPHAGE) 500 MG tablet Take 1 tablet (500 mg total) by mouth 2 (two) times daily with a meal. 180 tablet 3   metoprolol tartrate (LOPRESSOR) 25 MG tablet Take 1 tablet (25 mg total) by mouth 2 (two) times daily. Please contact the office to schedule appointment for additional refills. 3rd Attempt. 30 tablet 0   ocrelizumab (OCREVUS) 300 MG/10ML injection Inject 20 mLs (600 mg total)  into the vein every 6 (six) months. 20 mL 0   OZEMPIC, 1 MG/DOSE, 4 MG/3ML SOPN INJECT 1 MG ONCE A WEEK AS DIRECTED 3 mL 3   venlafaxine XR (EFFEXOR-XR) 150 MG 24 hr capsule Take 1 capsule (150 mg total) by mouth daily with breakfast. 90 capsule 3   venlafaxine XR (EFFEXOR-XR) 75 MG 24 hr capsule Take 1 capsule (75 mg total) by mouth daily with breakfast. 90 capsule 3   No facility-administered medications prior to visit.     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   LIMA to LAD off pump     PAST SURGICAL HISTORY: Past Surgical History:  Procedure Laterality Date   CARDIAC CATHETERIZATION N/A 04/05/2015   Procedure: Left Heart Cath and Coronary Angiography;  Surgeon: Troy Sine, MD;  Location: [facility];  Service: Cardiovascular;  Laterality: N/A;   CERVICAL CONE BIOPSY  2004   CESAREAN SECTION     1996, 2006   CHOLECYSTECTOMY     1988   COLPOSCOPY  2004   CORONARY ARTERY BYPASS GRAFT N/A 04/07/2015   Procedure: CORONARY ARTERY BYPASS GRAFTING (CABG), OFF PUMP, TIMES ONE, USING LEFT INTERNAL MAMMARY ARTERY;  Surgeon: Rexene Alberts, MD;  Location: [facility];  Service: Open Heart Surgery;  Laterality: N/A;  LIMA to LAD   TEE WITHOUT CARDIOVERSION N/A 04/07/2015   Procedure: TRANSESOPHAGEAL ECHOCARDIOGRAM (TEE);  Surgeon: Rexene Alberts, MD;  Location: [facility];  Service: Open Heart Surgery;  Laterality: N/A;     FAMILY HISTORY: Family History  Problem Relation Age of Onset   Diabetes Mother    Hypertension Mother    Hypertension  Father    Atrial fibrillation Father    Diabetes Sister    Breast cancer Maternal Aunt    Arrhythmia Sister    Heart attack Maternal Grandfather    Stroke Neg Hx      SOCIAL HISTORY: Social History   Socioeconomic History   Marital status: Married    Spouse name: Not on file   Number of children: 4   Years of education: college   Highest education level: Not on file  Occupational History   Occupation: Programmer, multimedia: [employer]  Tobacco Use   Smoking status: Former    Packs/day: 1.50    Years: 15.00    Total pack years: 22.50    Types: Cigarettes    Quit date: 02/03/2004    Years since quitting: 18.0   Smokeless tobacco: Never  Substance and Sexual Activity   Alcohol use: Yes    Alcohol/week: 1.0 standard drink of alcohol    Types: 1 Glasses of wine per week   Drug use: No   Sexual activity: Yes    Partners: Male    Birth control/protection: Surgical    Comment: vasectomy  Other Topics Concern   Not on file  Social History Narrative   Patient lives at home with her husband and children. Patient works for the [employer] in [place]. College education.    Social Determinants of Health   Financial Resource Strain: Not on file  Food Insecurity: Not on file  Transportation Needs: Not on file  Physical Activity: Not on file  Stress: Not on file  Social Connections: Not on file  Intimate Partner Violence: Not on file     Marcial Pacas, M.D. Ph.D.  [facility] [place], [place] [address] Phone: [phone] Fax:      [phone]

## 2022-03-06 ENCOUNTER — Telehealth: Payer: Self-pay | Admitting: Cardiovascular Disease

## 2022-03-06 MED ORDER — METOPROLOL TARTRATE 25 MG PO TABS: 25.0000 mg | ORAL_TABLET | Freq: Two times a day (BID) | ORAL | 0 refills | Status: DC

## 2022-03-06 MED ORDER — ATORVASTATIN CALCIUM 80 MG PO TABS: ORAL_TABLET | ORAL | 0 refills | Status: DC

## 2022-03-06 MED ORDER — LISINOPRIL 10 MG PO TABS: 10.0000 mg | ORAL_TABLET | Freq: Two times a day (BID) | ORAL | 0 refills | Status: DC

## 2022-03-06 NOTE — Telephone Encounter (Signed)
*  STAT* If patient is at the pharmacy, call can be transferred to refill team.   1. Which medications need to be refilled? (please list name of each medication and dose if known) lisinopril (ZESTRIL) 10 MG tablet   metoprolol tartrate (LOPRESSOR) 25 MG tablet    atorvastatin (LIPITOR) 80 MG tablet    2. Which pharmacy/location (including street and city if local pharmacy) is medication to be sent to?  [id number]- [place], [place] - [address]   3. Do they need a 30 day or 90 day supply? 90  Patient has appt on 2/9

## 2022-03-25 ENCOUNTER — Other Ambulatory Visit: Payer: Self-pay | Admitting: Family Medicine

## 2022-03-25 DIAGNOSIS — E1169 Type 2 diabetes mellitus with other specified complication: Secondary | ICD-10-CM

## 2022-03-30 ENCOUNTER — Other Ambulatory Visit: Payer: Self-pay | Admitting: Cardiovascular Disease

## 2022-04-19 ENCOUNTER — Ambulatory Visit: Payer: Managed Care, Other (non HMO) | Admitting: Family Medicine

## 2022-04-19 ENCOUNTER — Encounter: Payer: Self-pay | Admitting: Family Medicine

## 2022-04-19 VITALS — BP 114/71 | HR 81 | Temp 97.5°F | Ht 63.0 in | Wt 172.8 lb

## 2022-04-19 DIAGNOSIS — Z6830 Body mass index (BMI) 30.0-30.9, adult: Secondary | ICD-10-CM

## 2022-04-19 DIAGNOSIS — M62838 Other muscle spasm: Secondary | ICD-10-CM

## 2022-04-19 DIAGNOSIS — E1169 Type 2 diabetes mellitus with other specified complication: Secondary | ICD-10-CM | POA: Diagnosis not present

## 2022-04-19 DIAGNOSIS — E1159 Type 2 diabetes mellitus with other circulatory complications: Secondary | ICD-10-CM

## 2022-04-19 DIAGNOSIS — R2232 Localized swelling, mass and lump, left upper limb: Secondary | ICD-10-CM

## 2022-04-19 DIAGNOSIS — I152 Hypertension secondary to endocrine disorders: Secondary | ICD-10-CM

## 2022-04-19 DIAGNOSIS — Z23 Encounter for immunization: Secondary | ICD-10-CM

## 2022-04-19 DIAGNOSIS — M48061 Spinal stenosis, lumbar region without neurogenic claudication: Secondary | ICD-10-CM

## 2022-04-19 LAB — BAYER DCA HB A1C WAIVED: HB A1C (BAYER DCA - WAIVED): 6.8 % — ABNORMAL HIGH (ref 4.8–5.6)

## 2022-04-19 MED ORDER — TIRZEPATIDE 7.5 MG/0.5ML ~~LOC~~ SOAJ: 7.5000 mg | SUBCUTANEOUS | 3 refills | Status: DC

## 2022-04-19 NOTE — Patient Instructions (Signed)

## 2022-04-19 NOTE — Progress Notes (Signed)
Subjective:  Patient ID: Kaylee Bryant, female    DOB: 11-16-1965, 57 y.o.   MRN: [id number]  Patient Care Team: Baruch Gouty, FNP as PCP - General (Family Medicine)   Chief Complaint:  Diabetes (3 month follow up ) and Cyst (Under left arm )   HPI: Kaylee Bryant is a 57 y.o. female presenting on 04/19/2022 for Diabetes (3 month follow up ) and Cyst (Under left arm )  1. Type 2 diabetes mellitus with other specified complication, without long-term current use of insulin (HCC) Has been taking Ozempic but has GI upset with medications and still having high blood sugar readings in the evenings, never higher than 250. Denies polyuria or polydipsia. Does have polyphagia.   2. Hypertension associated with type 2 diabetes mellitus ([place]) Compliant with medications without associated side effects. No cough, leg swelling, chest pain, palpitations, headaches, weakness, or confusion.   3. Morbid obesity ([place]) Does follow a diet and tries to stay active daily.   4. Muscle spasm 5. Spinal stenosis of lumbar region without neurogenic claudication Reports ongoing and worsening lower extremity pain and spasms. Was told by her neurologist that she needs a referral to pain management. Would like this today.   6. Mass of left upper extremity Noticed a mass/knot to her left upper arm several weeks ago. States area has grown in size. Not tender. No drainage, erythema, or swelling.       Relevant past medical, surgical, family, and social history reviewed and updated as indicated.  Allergies and medications reviewed and updated. Data reviewed: Chart in Epic.   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   LIMA to LAD off pump    Past Surgical History:  Procedure Laterality Date   CARDIAC CATHETERIZATION N/A 04/05/2015   Procedure: Left Heart Cath and Coronary Angiography;  Surgeon: Troy Sine, MD;  Location: [facility];  Service: Cardiovascular;  Laterality: N/A;   CERVICAL CONE BIOPSY  2004   CESAREAN SECTION     1996, 2006   CHOLECYSTECTOMY     1988   COLPOSCOPY  2004   CORONARY ARTERY BYPASS GRAFT N/A 04/07/2015   Procedure: CORONARY ARTERY BYPASS GRAFTING (CABG), OFF PUMP, TIMES ONE, USING LEFT INTERNAL MAMMARY ARTERY;  Surgeon: Rexene Alberts, MD;  Location: [facility];  Service: Open Heart Surgery;  Laterality: N/A;  LIMA to LAD   TEE WITHOUT CARDIOVERSION N/A 04/07/2015   Procedure: TRANSESOPHAGEAL ECHOCARDIOGRAM (TEE);  Surgeon: Rexene Alberts, MD;  Location: [facility];  Service: Open Heart Surgery;  Laterality: N/A;    Social History   Socioeconomic History   Marital status: Married    Spouse name: Not on file   Number of children: 4   Years of education: college   Highest education level: Not on file  Occupational History   Occupation: Programmer, multimedia: [employer]  Tobacco Use   Smoking status: Former    Packs/day: 1.50  Years: 15.00    Total pack years: 22.50    Types: Cigarettes    Quit date: 02/03/2004    Years since quitting: 18.2   Smokeless tobacco: Never  Substance and Sexual Activity   Alcohol use: Yes    Alcohol/week: 1.0 standard drink of alcohol    Types: 1 Glasses of wine per week   Drug use: No   Sexual activity: Yes    Partners: Male    Birth control/protection: Surgical    Comment: vasectomy  Other Topics Concern   Not on file  Social History Narrative   Patient lives at home with her husband and children. Patient works for the [employer] in [place]. College education.    Social Determinants of Health    Financial Resource Strain: Not on file  Food Insecurity: Not on file  Transportation Needs: Not on file  Physical Activity: Not on file  Stress: Not on file  Social Connections: Not on file  Intimate Partner Violence: Not on file    Outpatient Encounter Medications as of 04/19/2022  Medication Sig   acetaminophen (TYLENOL) 500 MG tablet Take 1,000 mg by mouth every 6 (six) hours as needed for mild pain.   aspirin EC 81 MG tablet Take 81 mg by mouth daily.   atorvastatin (LIPITOR) 80 MG tablet TAKE 1 TABLET BY MOUTH DAILY AT 6 PM. PLEASE KEEP FUTURE APPOINTMENT FOR FUTURE REFILLS   baclofen (LIORESAL) 10 MG tablet Take 1 tablet (10 mg total) by mouth 3 (three) times daily.   Continuous Blood Gluc Receiver (FREESTYLE LIBRE 2 READER) DEVI 1 Device by Does not apply route 4 (four) times daily.   Continuous Blood Gluc Sensor (FREESTYLE LIBRE 2 SENSOR) MISC USE TO CHECK SUGAR 4 (FOUR) TIMES DAILY. DX E11.65   cyclobenzaprine (FLEXERIL) 5 MG tablet Take 1 tablet (5 mg total) by mouth 2 (two) times daily.   gabapentin (NEURONTIN) 300 MG capsule Take 2 capsules (600 mg total) by mouth 3 (three) times daily.   lisinopril (ZESTRIL) 10 MG tablet TAKE 1 TABLET BY MOUTH 2 TIMES DAILY. PLEASE KEEP FUTURE APPOINTMENT FOR FUTURE REFILLS   metFORMIN (GLUCOPHAGE) 500 MG tablet Take 1 tablet (500 mg total) by mouth 2 (two) times daily with a meal.   metoprolol tartrate (LOPRESSOR) 25 MG tablet TAKE 1 TABLET BY MOUTH 2 TIMES DAILY. PLEASE KEEP FUTURE APPOINTMENT FOR FUTURE REFILLS   ocrelizumab (OCREVUS) 300 MG/10ML injection Inject 20 mLs (600 mg total) into the vein every 6 (six) months.   tirzepatide (MOUNJARO) 7.5 MG/0.5ML Pen Inject 7.5 mg into the skin once a week.   venlafaxine XR (EFFEXOR-XR) 150 MG 24 hr capsule Take 1 capsule (150 mg total) by mouth daily with breakfast.   venlafaxine XR (EFFEXOR-XR) 75 MG 24 hr capsule Take 1 capsule (75 mg total) by mouth daily with breakfast.   [DISCONTINUED]  Semaglutide, 1 MG/DOSE, (OZEMPIC, 1 MG/DOSE,) 4 MG/3ML SOPN INJECT 1ML SUBCUTANEOUS WEEKLY   No facility-administered encounter medications on file as of 04/19/2022.    No Known Allergies  Review of Systems  Constitutional:  Negative for activity change, appetite change, chills, diaphoresis, fatigue, fever and unexpected weight change.  HENT: Negative.  Negative for congestion.   Eyes: Negative.  Negative for photophobia and visual disturbance.  Respiratory:  Negative for cough, chest tightness and shortness of breath.   Cardiovascular:  Negative for chest pain, palpitations and leg swelling.  Gastrointestinal:  Negative for abdominal pain, blood in stool, constipation, diarrhea, nausea and vomiting.  Endocrine: Positive for polyphagia. Negative for cold intolerance, heat intolerance, polydipsia and polyuria.  Genitourinary:  Negative for decreased urine volume, difficulty urinating, dysuria, frequency and urgency.  Musculoskeletal:  Positive for arthralgias and myalgias. Negative for back pain, gait problem, joint swelling, neck pain and neck stiffness.  Skin: Negative.   Allergic/Immunologic: Negative.   Neurological:  Negative for dizziness, tremors, seizures, syncope, facial asymmetry, speech difficulty, weakness, light-headedness, numbness and headaches.  Hematological: Negative.   Psychiatric/Behavioral:  Negative for confusion, hallucinations, sleep disturbance and suicidal ideas.   All other systems reviewed and are negative.       Objective:  BP 114/71   Pulse 81   Temp (!) 97.5 F (36.4 C) (Temporal)   Ht '5\' 3"'$  (1.6 m)   Wt 172 lb 12.8 oz (78.4 kg)   LMP 10/03/2014 Comment: last period was approx 6 months ago, "i don't have it anymore"  SpO2 98%   BMI 30.61 kg/m    Wt Readings from Last 3 Encounters:  04/19/22 172 lb 12.8 oz (78.4 kg)  02/22/22 173 lb 8 oz (78.7 kg)  01/16/22 170 lb (77.1 kg)    Physical Exam Vitals and nursing note reviewed.  Constitutional:       General: She is not in acute distress.    Appearance: Normal appearance. She is well-developed and well-groomed. She is not ill-appearing, toxic-appearing or diaphoretic.  HENT:     Head: Normocephalic and atraumatic.     Jaw: There is normal jaw occlusion.     Right Ear: Hearing normal.     Left Ear: Hearing normal.     Nose: Nose normal.     Mouth/Throat:     Lips: Pink.     Mouth: Mucous membranes are moist.     Pharynx: Oropharynx is clear. Uvula midline.  Eyes:     General: Lids are normal.     Extraocular Movements: Extraocular movements intact.     Conjunctiva/sclera: Conjunctivae normal.     Pupils: Pupils are equal, round, and reactive to light.  Neck:     Thyroid: No thyroid mass, thyromegaly or thyroid tenderness.     Vascular: No carotid bruit or JVD.     Trachea: Trachea and phonation normal.  Cardiovascular:     Rate and Rhythm: Normal rate and regular rhythm.     Chest Wall: PMI is not displaced.     Pulses: Normal pulses.     Heart sounds: Normal heart sounds. No murmur heard.    No friction rub. No gallop.  Pulmonary:     Effort: Pulmonary effort is normal. No respiratory distress.     Breath sounds: Normal breath sounds. No wheezing.  Abdominal:     General: Bowel sounds are normal. There is no distension or abdominal bruit.     Palpations: Abdomen is soft. There is no hepatomegaly or splenomegaly.     Tenderness: There is no abdominal tenderness. There is no right CVA tenderness or left CVA tenderness.     Hernia: No hernia is present.  Musculoskeletal:        General: Normal range of motion.     Cervical back: Normal range of motion and neck supple.     Right lower leg: No edema.     Left lower leg: No edema.  Lymphadenopathy:     Cervical: No cervical adenopathy.  Skin:    General: Skin is warm and dry.     Capillary Refill: Capillary refill takes less than 2 seconds.  Coloration: Skin is not cyanotic, jaundiced or pale.     Findings: No  rash.       Neurological:     General: No focal deficit present.     Mental Status: She is alert and oriented to person, place, and time.     Sensory: Sensation is intact.     Motor: Motor function is intact.     Coordination: Coordination is intact.     Gait: Gait is intact.     Deep Tendon Reflexes: Reflexes are normal and symmetric.  Psychiatric:        Attention and Perception: Attention and perception normal.        Mood and Affect: Mood and affect normal.        Speech: Speech normal.        Behavior: Behavior normal. Behavior is cooperative.        Thought Content: Thought content normal.        Cognition and Memory: Cognition and memory normal.        Judgment: Judgment normal.     Results for orders placed or performed in visit on 01/16/22  Bayer DCA Hb A1c Waived  Result Value Ref Range   HB A1C (BAYER DCA - WAIVED) 6.7 (H) 4.8 - 5.6 %  Microalbumin / creatinine urine ratio  Result Value Ref Range   Creatinine, Urine 45.6 Not Estab. mg/dL   Microalbumin, Urine <3.0 Not Estab. ug/mL   Microalb/Creat Ratio <7 0 - 29 mg/g creat       Pertinent labs & imaging results that were available during my care of the patient were reviewed by me and considered in my medical decision making.  Assessment & Plan:  Marja was seen today for diabetes and cyst.  Diagnoses and all orders for this visit:  Type 2 diabetes mellitus with other specified complication, without long-term current use of insulin (HCC) A1C 6.8 but unable to tolerate Ozempic due to GI side effects. Will switch to [facility] to see if able to tolerate.  -     CMP14+EGFR -     Bayer DCA Hb A1c Waived -     Magnesium -     tirzepatide (MOUNJARO) 7.5 MG/0.5ML Pen; Inject 7.5 mg into the skin once a week.  Hypertension associated with type 2 diabetes mellitus (HCC) BP well controlled. Changes were not made in regimen today. Goal BP is 130/80. Pt aware to report any persistent high or low readings. DASH diet and  exercise encouraged. Exercise at least 150 minutes per week and increase as tolerated. Goal BMI > 25. Stress management encouraged. Avoid nicotine and tobacco product use. Avoid excessive alcohol and NSAID's. Avoid more than 2000 mg of sodium daily. Medications as prescribed. Follow up as scheduled.  -     CMP14+EGFR -     Bayer DCA Hb A1c Waived -     Magnesium  Morbid obesity (HCC) Diet and exercise encouraged.  -     CMP14+EGFR -     Bayer DCA Hb A1c Waived -     Magnesium  Muscle spasm Will check for potential underlying causes. Pt would like referral to pain management, referral placed.  -     CMP14+EGFR -     Magnesium -     Ambulatory referral to Pain Clinic  Spinal stenosis of lumbar region without neurogenic claudication -     Ambulatory referral to Pain Clinic  Mass of left upper extremity Will obtain imaging for further evaluation.  -  [place] LT UPPER EXTREM LTD SOFT TISSUE NON VASCULAR; Future  Need for immunization against influenza -     Flu Vaccine QUAD 30moIM (Fluarix, Fluzone & Alfiuria Quad PF)     Continue all other maintenance medications.  Follow up plan: Return in about 3 months (around 07/19/2022), or if symptoms worsen or fail to improve, for DM.   Continue healthy lifestyle choices, including diet (rich in fruits, vegetables, and lean proteins, and low in salt and simple carbohydrates) and exercise (at least 30 minutes of moderate physical activity daily).  Educational handout given for DM  The above assessment and management plan was discussed with the patient. The patient verbalized understanding of and has agreed to the management plan. Patient is aware to call the clinic if they develop any new symptoms or if symptoms persist or worsen. Patient is aware when to return to the clinic for a follow-up visit. Patient educated on when it is appropriate to go to the emergency department.   MMonia Pouch FNP-C [facility] [phone]

## 2022-04-20 LAB — MAGNESIUM: Magnesium: 1.8 mg/dL (ref 1.6–2.3)

## 2022-04-20 LAB — CMP14+EGFR
ALT: 18 IU/L (ref 0–32)
AST: 16 IU/L (ref 0–40)
Albumin/Globulin Ratio: 2.1 (ref 1.2–2.2)
Albumin: 4.4 g/dL (ref 3.8–4.9)
Alkaline Phosphatase: 72 IU/L (ref 44–121)
BUN/Creatinine Ratio: 18 (ref 9–23)
BUN: 12 mg/dL (ref 6–24)
Bilirubin Total: 0.3 mg/dL (ref 0.0–1.2)
CO2: 21 mmol/L (ref 20–29)
Calcium: 8.9 mg/dL (ref 8.7–10.2)
Chloride: 101 mmol/L (ref 96–106)
Creatinine, Ser: 0.68 mg/dL (ref 0.57–1.00)
Globulin, Total: 2.1 g/dL (ref 1.5–4.5)
Glucose: 190 mg/dL — ABNORMAL HIGH (ref 70–99)
Potassium: 4.3 mmol/L (ref 3.5–5.2)
Sodium: 139 mmol/L (ref 134–144)
Total Protein: 6.5 g/dL (ref 6.0–8.5)
eGFR: 103 mL/min/{1.73_m2} (ref >59)

## 2022-04-26 ENCOUNTER — Encounter: Payer: Self-pay | Admitting: Physical Medicine & Rehabilitation

## 2022-04-26 ENCOUNTER — Ambulatory Visit (HOSPITAL_COMMUNITY): Payer: Managed Care, Other (non HMO)

## 2022-04-26 ENCOUNTER — Other Ambulatory Visit: Payer: Self-pay | Admitting: Cardiovascular Disease

## 2022-05-01 ENCOUNTER — Other Ambulatory Visit: Payer: Self-pay | Admitting: Neurology

## 2022-05-02 ENCOUNTER — Ambulatory Visit (HOSPITAL_COMMUNITY)
Admission: RE | Admit: 2022-05-02 | Discharge: 2022-05-02 | Disposition: A | Discharge status: Home / Self Care | Payer: Managed Care, Other (non HMO) | Source: Ambulatory Visit | Attending: Family Medicine | Admitting: Family Medicine

## 2022-05-02 DIAGNOSIS — R2232 Localized swelling, mass and lump, left upper limb: Secondary | ICD-10-CM

## 2022-05-04 ENCOUNTER — Ambulatory Visit: Payer: Managed Care, Other (non HMO) | Attending: Cardiovascular Disease | Admitting: Cardiovascular Disease

## 2022-05-04 ENCOUNTER — Encounter: Payer: Self-pay | Admitting: Cardiovascular Disease

## 2022-05-04 VITALS — BP 128/76 | HR 69 | Ht 63.0 in | Wt 172.2 lb

## 2022-05-04 DIAGNOSIS — E1159 Type 2 diabetes mellitus with other circulatory complications: Secondary | ICD-10-CM

## 2022-05-04 DIAGNOSIS — I152 Hypertension secondary to endocrine disorders: Secondary | ICD-10-CM

## 2022-05-04 DIAGNOSIS — E1169 Type 2 diabetes mellitus with other specified complication: Secondary | ICD-10-CM

## 2022-05-04 DIAGNOSIS — E785 Hyperlipidemia, unspecified: Secondary | ICD-10-CM

## 2022-05-04 DIAGNOSIS — Z951 Presence of aortocoronary bypass graft: Secondary | ICD-10-CM | POA: Diagnosis not present

## 2022-05-04 DIAGNOSIS — I10 Essential (primary) hypertension: Secondary | ICD-10-CM

## 2022-05-04 NOTE — Progress Notes (Signed)
05/04/2022 Jannelle A Mccadden   1966-03-23  DK:[id number]  Primary Physician Baruch Gouty, FNP Primary Cardiologist: Lorretta Harp MD Renae Gloss  HPI:  Kaylee Bryant is a 57 y.o.   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 I last saw her in the office 08/09/2020. She has a history of multiple sclerosis the past as well as diabetes and remote tobacco abuse having quit in 2005. She had a non-STEMI on 04/05/15 with unstable angina. She is catheterized by Dr. Claiborne Billings revealing a 95% ostial LAD lesion and 2 days later underwent off-pump LIMA placement by Dr. Roxy Manns. She participated in cardiac rehabilitation briefly.   Since I saw her a year ago she continues to do well.  She denies chest pain or shortness of breath.  She walks for her job and it is minimally symptomatic.  She has lost 25 pounds over last 100 semisolid diet.   Current Meds  Medication Sig   acetaminophen (TYLENOL) 500 MG tablet Take 1,000 mg by mouth every 6 (six) hours as needed for mild pain.   aspirin EC 81 MG tablet Take 81 mg by mouth daily.   atorvastatin (LIPITOR) 80 MG tablet TAKE 1 TABLET BY MOUTH DAILY AT 6 PM. PLEASE KEEP FUTURE APPOINTMENT FOR FUTURE REFILLS   baclofen (LIORESAL) 10 MG tablet TAKE 2 TABLETS BY MOUTH 3 TIMES A DAY   Continuous Blood Gluc Receiver (FREESTYLE LIBRE 2 READER) DEVI 1 Device by Does not apply route 4 (four) times daily.   Continuous Blood Gluc Sensor (FREESTYLE LIBRE 2 SENSOR) MISC USE TO CHECK SUGAR 4 (FOUR) TIMES DAILY. DX E11.65   cyclobenzaprine (FLEXERIL) 5 MG tablet Take 1 tablet (5 mg total) by mouth 2 (two) times daily.   gabapentin (NEURONTIN) 300 MG capsule Take 2 capsules (600 mg total) by mouth 3 (three) times daily.   lisinopril (ZESTRIL) 10 MG tablet TAKE 1 TABLET BY MOUTH 2 TIMES DAILY. PLEASE KEEP FUTURE APPOINTMENT FOR FUTURE REFILLS   metFORMIN (GLUCOPHAGE) 500 MG tablet Take 1  tablet (500 mg total) by mouth 2 (two) times daily with a meal.   metoprolol tartrate (LOPRESSOR) 25 MG tablet TAKE 1 TABLET BY MOUTH 2 TIMES DAILY. PLEASE KEEP FUTURE APPOINTMENT FOR FUTURE REFILLS   ocrelizumab (OCREVUS) 300 MG/10ML injection Inject 20 mLs (600 mg total) into the vein every 6 (six) months.   tirzepatide (MOUNJARO) 7.5 MG/0.5ML Pen Inject 7.5 mg into the skin once a week.   venlafaxine XR (EFFEXOR-XR) 150 MG 24 hr capsule Take 1 capsule (150 mg total) by mouth daily with breakfast.   venlafaxine XR (EFFEXOR-XR) 75 MG 24 hr capsule Take 1 capsule (75 mg total) by mouth daily with breakfast.     No Known Allergies  Social History   Socioeconomic History   Marital status: Married    Spouse name: Not on file   Number of children: 4   Years of education: college   Highest education level: Not on file  Occupational History   Occupation: Programmer, multimedia: [employer]  Tobacco Use   Smoking status: Former    Packs/day: 1.50    Years: 15.00    Total pack years: 22.50    Types: Cigarettes    Quit date: 02/03/2004    Years since quitting: 18.2   Smokeless tobacco: Never  Substance and Sexual Activity   Alcohol use:  Yes    Alcohol/week: 1.0 standard drink of alcohol    Types: 1 Glasses of wine per week   Drug use: No   Sexual activity: Yes    Partners: Male    Birth control/protection: Surgical    Comment: vasectomy  Other Topics Concern   Not on file  Social History Narrative   Patient lives at home with her husband and children. Patient works for the [employer] in [place]. College education.    Social Determinants of Health   Financial Resource Strain: Not on file  Food Insecurity: Not on file  Transportation Needs: Not on file  Physical Activity: Not on file  Stress: Not on file  Social Connections: Not on file  Intimate Partner Violence: Not on file     Review of Systems: General: negative for chills, fever, night sweats or weight changes.   Cardiovascular: negative for chest pain, dyspnea on exertion, edema, orthopnea, palpitations, paroxysmal nocturnal dyspnea or shortness of breath Dermatological: negative for rash Respiratory: negative for cough or wheezing Urologic: negative for hematuria Abdominal: negative for nausea, vomiting, diarrhea, bright red blood per rectum, melena, or hematemesis Neurologic: negative for visual changes, syncope, or dizziness All other systems reviewed and are otherwise negative except as noted above.    Blood pressure 128/76, pulse 69, height 5' 3"$  (1.6 m), weight 172 lb 3.2 oz (78.1 kg), last menstrual period 10/03/2014.  General appearance: alert and no distress Neck: no adenopathy, no carotid bruit, no JVD, supple, symmetrical, trachea midline, and thyroid not enlarged, symmetric, no tenderness/mass/nodules Lungs: clear to auscultation bilaterally Heart: regular rate and rhythm, S1, S2 normal, no murmur, click, rub or gallop Extremities: extremities normal, atraumatic, no cyanosis or edema Pulses: 2+ and symmetric Skin: Skin color, texture, turgor normal. No rashes or lesions Neurologic: Grossly normal  EKG sinus rhythm at 69 with poor R wave progression suggesting old anterior infarct.  I personally reviewed this EKG.  This  ASSESSMENT AND PLAN:   S/P CABG x 1 History of CAD status post off-pump LIMA in the setting of non-STEMI and catheterization performed by Dr. Claiborne Billings revealing 95% ostial LAD lesion by Dr. Roxy Manns back in 2017.  She has been stable since and is totally asymptomatic.  Hyperlipidemia associated with type 2 diabetes mellitus ([place]) History of hyperlipidemia on high-dose statin therapy with lipid profile performed 10/13/2021 revealing total cholesterol 114, LDL 56 and HDL 42.  Hypertension associated with type 2 diabetes mellitus ([place]) Of essential blood pressure measured today at 128/76.  She is not on antihypertensive medications.     Lorretta Harp MD  FACP,FACC,FAHA, [facility] 05/04/2022 8:17 AM

## 2022-05-04 NOTE — Patient Instructions (Signed)
Medication Instructions:  Your physician recommends that you continue on your current medications as directed. Please refer to the Current Medication list given to you today.  *If you need a refill on your cardiac medications before your next appointment, please call your pharmacy*   Follow-Up: At [facility], you and your health needs are our priority.  As part of our continuing mission to provide you with exceptional heart care, we have created designated Provider Care Teams.  These Care Teams include your primary Cardiologist (physician) and Advanced Practice Providers (APPs -  Physician Assistants and Nurse Practitioners) who all work together to provide you with the care you need, when you need it.  We recommend signing up for the patient portal called "MyChart".  Sign up information is provided on this After Visit Summary.  MyChart is used to connect with patients for Virtual Visits (Telemedicine).  Patients are able to view lab/test results, encounter notes, upcoming appointments, etc.  Non-urgent messages can be sent to your provider as well.   To learn more about what you can do with MyChart, go to [email or web address].    Your next appointment:   12 month(s)  Provider:   Jonathan Berry, MD    

## 2022-05-04 NOTE — Assessment & Plan Note (Signed)
History of hyperlipidemia on high-dose statin therapy with lipid profile performed 10/13/2021 revealing total cholesterol 114, LDL 56 and HDL 42.

## 2022-05-04 NOTE — Assessment & Plan Note (Signed)
Of essential blood pressure measured today at 128/76.  She is not on antihypertensive medications.

## 2022-05-04 NOTE — Assessment & Plan Note (Signed)
History of CAD status post off-pump LIMA in the setting of non-STEMI and catheterization performed by Dr. Claiborne Billings revealing 95% ostial LAD lesion by Dr. Roxy Manns back in 2017.  She has been stable since and is totally asymptomatic.

## 2022-05-17 ENCOUNTER — Encounter
Payer: Managed Care, Other (non HMO) | Attending: Physical Medicine & Rehabilitation | Admitting: Physical Medicine & Rehabilitation

## 2022-05-17 ENCOUNTER — Encounter: Payer: Self-pay | Admitting: Physical Medicine & Rehabilitation

## 2022-05-17 VITALS — BP 146/95 | HR 75 | Ht 63.0 in | Wt 175.0 lb

## 2022-05-17 DIAGNOSIS — G894 Chronic pain syndrome: Secondary | ICD-10-CM | POA: Diagnosis present

## 2022-05-17 DIAGNOSIS — Z79891 Long term (current) use of opiate analgesic: Secondary | ICD-10-CM | POA: Insufficient documentation

## 2022-05-17 DIAGNOSIS — G8929 Other chronic pain: Secondary | ICD-10-CM | POA: Diagnosis present

## 2022-05-17 DIAGNOSIS — M62838 Other muscle spasm: Secondary | ICD-10-CM | POA: Insufficient documentation

## 2022-05-17 DIAGNOSIS — G35 Multiple sclerosis: Secondary | ICD-10-CM | POA: Diagnosis present

## 2022-05-17 DIAGNOSIS — M545 Low back pain, unspecified: Secondary | ICD-10-CM | POA: Diagnosis present

## 2022-05-17 DIAGNOSIS — Z5181 Encounter for therapeutic drug level monitoring: Secondary | ICD-10-CM | POA: Diagnosis present

## 2022-05-17 MED ORDER — BACLOFEN 10 MG PO TABS: 10.0000 mg | ORAL_TABLET | Freq: Every day | ORAL | 0 refills | Status: DC

## 2022-05-17 NOTE — Progress Notes (Addendum)
Subjective:    Patient ID: Kaylee Bryant, female    DOB: 1965/05/19, 57 y.o.   MRN: [id number]  HPI    Kaylee Bryant is a 57 y.o. 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   They are presenting to PM&R clinic as a new patient for pain management evaluation. They were referred by Darla Lesches for treatment of lower back and leg pain.  She has a history of relapsing remitting multiple sclerosis and spinal stenosis.  She is followed by neurology.  For many years she has had pain going down her right lower extremity.  She feels like the pain is caused by muscle spasms and notices her thigh and leg muscles tight during this time.  Pain occurs most frequently in the evenings.  She thinks the pain is also worse seen by her job that is very physical as she works as a Marine scientist.  She has occasional tingling in her right lower extremity.  She has tingling in her feet that she attributes to diabetic neuropathy.  She reports low energy due to her MS.   Red flag symptoms: No red flags for back pain endorsed in Hx or ROS, saddle anesthesia, loss of bowel or bladder continence, new weakness, new numbness/tingling, and pain waking up at nighttime.  Medications tried: Topical medications Capsaicin- helps a little Baclofen '20mg'$  TID -helps her pain and muscle spasms Flexeril '5mg'$  helps  Reports tizanidine was no longer working Nsaids - ibuprofen- helps a little Tylenol  -Minimal benefit -Norco helped when she had this for tooth pulled Tramadol, has not tried Opiates has not tried Gabapentin - helps her pain Lyrica -caused her to be drowsy She takes Effexor for  mood  Other treatments: PT/OT  - difficult to complete due to schedule,  TENs helps slightly Hot shower helps TENS- helps Walking on treatmill helps   Prior UDS results: No results found for: "LABOPIA", "COCAINSCRNUR", "LABBENZ", "AMPHETMU", "THCU", "LABBARB"    Pain Inventory Average Pain 10 Pain Right Now 3 My pain is intermittent and sharp  In the last 24 hours, has pain interfered with the following? General activity 7 Relation with others 7 Enjoyment of life 7 What TIME of day is your pain at its worst? evening Sleep (in general) Poor  Pain is worse with: unsure Pain improves with: pacing activities, medication, and TENS Relief from Meds: 4  walk without assistance ability to climb steps?  yes do you drive?  yes  Employed 40 hours Nurse  weakness numbness tingling spasms  CT/MRI  New patient    Family History  Problem Relation Age of Onset   Diabetes Mother    Hypertension Mother    Hypertension Father    Atrial fibrillation Father    Diabetes Sister    Breast cancer Maternal Aunt    Arrhythmia Sister    Heart attack Maternal Grandfather    Stroke Neg Hx    Social History   Socioeconomic History   Marital status: Married    Spouse name: Not on file   Number of children: 4   Years of education: college   Highest education level: Not on file  Occupational History   Occupation: Therapist, sports  Employer: [employer]  Tobacco Use   Smoking status: Former    Packs/day: 1.50    Years: 15.00    Total pack years: 22.50    Types: Cigarettes    Quit date: 02/03/2004    Years since quitting: 18.2   Smokeless tobacco: Never  Substance and Sexual Activity   Alcohol use: Yes    Alcohol/week: 1.0 standard drink of alcohol    Types: 1 Glasses of wine per week   Drug use: No   Sexual activity: Yes    Partners: Male    Birth control/protection: Surgical    Comment: vasectomy  Other Topics Concern   Not on file  Social History Narrative    Patient lives at home with her husband and children. Patient works for the [employer] in [place]. College education.    Social Determinants of Health   Financial Resource Strain: Not on file  Food Insecurity: Not on file  Transportation Needs: Not on file  Physical Activity: Not on file  Stress: Not on file  Social Connections: Not on file   Past Surgical History:  Procedure Laterality Date   CARDIAC CATHETERIZATION N/A 04/05/2015   Procedure: Left Heart Cath and Coronary Angiography;  Surgeon: Troy Sine, MD;  Location: [facility];  Service: Cardiovascular;  Laterality: N/A;   CERVICAL CONE BIOPSY  2004   CESAREAN SECTION     1996, 2006   CHOLECYSTECTOMY     1988   COLPOSCOPY  2004   CORONARY ARTERY BYPASS GRAFT N/A 04/07/2015   Procedure: CORONARY ARTERY BYPASS GRAFTING (CABG), OFF PUMP, TIMES ONE, USING LEFT INTERNAL MAMMARY ARTERY;  Surgeon: Rexene Alberts, MD;  Location: [facility];  Service: Open Heart Surgery;  Laterality: N/A;  LIMA to LAD   TEE WITHOUT CARDIOVERSION N/A 04/07/2015   Procedure: TRANSESOPHAGEAL ECHOCARDIOGRAM (TEE);  Surgeon: Rexene Alberts, MD;  Location: [facility];  Service: Open Heart Surgery;  Laterality: N/A;   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 ([place]) 04/05/2015   DDD (degenerative disc disease), lumbar    DM (diabetes mellitus), gestational 2006   insulin at the time   Fatigue    Heart attack ([place])    IBS (irritable bowel syndrome)    with chronic constipation   Multiple sclerosis ([facility])    Obesity    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   LIMA to LAD off pump   BP (!) 146/95   Pulse 75   Ht 5' 3"$  (1.6 m)   Wt 175 lb (79.4 kg)   LMP 10/03/2014 Comment: last period was approx 6 months ago, "i don't have it anymore"  SpO2 98%   BMI 31.00 kg/m   Opioid Risk Score:   Fall Risk Score:  `1  Depression screen [facility]  2/9     04/19/2022    9:13 AM 01/16/2022    8:39 AM 01/16/2022    8:33 AM 10/13/2021    8:24 AM 07/13/2021    8:02 AM 03/23/2021    8:02 AM 12/15/2020   11:03 AM  Depression screen PHQ 2/9  Decreased Interest 0  0 0 1 0 3  Down, Depressed, Hopeless 1  0 0 0 1 2  PHQ - 2 Score 1  0 0 1 1 5  $ Altered sleeping 3 2  2 2 2 1  $ Tired,  decreased energy 2 2  2 2 $ 0 3  Change in appetite 3 3  2 3 $ 0 3  Feeling bad or failure about yourself  0 0  0 0 0 1  Trouble concentrating 1 2  0 2 0 2  Moving slowly or fidgety/restless 0 0  0 0 0 0  Suicidal thoughts 0 0  0 0 0 0  PHQ-9 Score 10   6 10 3 15  $ Difficult doing work/chores Somewhat difficult Somewhat difficult  Somewhat difficult  Somewhat difficult Somewhat difficult      LOCATION OF PAIN lower back and right leg    Prior Studies   Physicians involved in your care   Family History  Problem Relation Age of Onset   Diabetes Mother    Hypertension Mother    Hypertension Father    Atrial fibrillation Father    Diabetes Sister    Breast cancer Maternal Aunt    Arrhythmia Sister    Heart attack Maternal Grandfather    Stroke Neg Hx    Social History   Socioeconomic History   Marital status: Married    Spouse name: Not on file   Number of children: 4   Years of education: college   Highest education level: Not on file  Occupational History   Occupation: Programmer, multimedia: [employer]  Tobacco Use   Smoking status: Former    Packs/day: 1.50    Years: 15.00    Total pack years: 22.50    Types: Cigarettes    Quit date: 02/03/2004    Years since quitting: 18.2   Smokeless tobacco: Never  Substance and Sexual Activity   Alcohol use: Yes    Alcohol/week: 1.0 standard drink of alcohol    Types: 1 Glasses of wine per week   Drug use: No   Sexual activity: Yes    Partners: Male    Birth control/protection: Surgical    Comment: vasectomy  Other Topics Concern   Not on file  Social History Narrative   Patient  lives at home with her husband and children. Patient works for the [employer] in [place]. College education.    Social Determinants of Health   Financial Resource Strain: Not on file  Food Insecurity: Not on file  Transportation Needs: Not on file  Physical Activity: Not on file  Stress: Not on file  Social Connections: Not on file   Past Surgical History:  Procedure Laterality Date   CARDIAC CATHETERIZATION N/A 04/05/2015   Procedure: Left Heart Cath and Coronary Angiography;  Surgeon: Troy Sine, MD;  Location: [facility];  Service: Cardiovascular;  Laterality: N/A;   CERVICAL CONE BIOPSY  2004   CESAREAN SECTION     1996, 2006   CHOLECYSTECTOMY     1988   COLPOSCOPY  2004   CORONARY ARTERY BYPASS GRAFT N/A 04/07/2015   Procedure: CORONARY ARTERY BYPASS GRAFTING (CABG), OFF PUMP, TIMES ONE, USING LEFT INTERNAL MAMMARY ARTERY;  Surgeon: Rexene Alberts, MD;  Location: [facility];  Service: Open Heart Surgery;  Laterality: N/A;  LIMA to LAD   TEE WITHOUT CARDIOVERSION N/A 04/07/2015   Procedure: TRANSESOPHAGEAL ECHOCARDIOGRAM (TEE);  Surgeon: Rexene Alberts, MD;  Location: [facility];  Service: Open Heart Surgery;  Laterality: N/A;   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 (  [facility]) 04/05/2015   DDD (degenerative disc disease), lumbar    DM (diabetes mellitus), gestational 2006   insulin at the time   Fatigue    Heart attack ([facility])    IBS (irritable bowel syndrome)    with chronic constipation   Multiple sclerosis ([facility])    Obesity    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   LIMA to LAD off pump   BP (!) 146/95   Pulse 75   Ht 5\' 3"  (1.6 m)   Wt 175 lb (79.4 kg)   LMP 10/03/2014 Comment: last period was approx 6 months ago, "i don't have it anymore"  SpO2 98%   BMI 31.00 kg/m   Opioid Risk Score:   Fall Risk Score:  `1  Depression screen PHQ 2/9      Review of Systems  Musculoskeletal:        Right leg Lower back Right hip     Objective:   Physical Exam  Gen: no distress, normal appearing HEENT: oral mucosa pink and moist, NCAT Cardio: Reg rate Chest: normal effort, normal rate of breathing Abd: soft, non-distended Ext: no edema Psych: pleasant, normal affect Skin: intact Neuro: Awake and alert, normal speech and language, cranial nerves II through XII intact DTR normal and symmetric in bilateral upper and lower extremities No ankle clonus No ataxia or dysmetria Strength 5 out of 5 in bilateral upper extremities, strength 5 out of 5 in left lower extremity, strength 5- out of 5 right lower extremity Sensation intact light touch in all 4 extremities Musculoskeletal:   Gait normal, normal cadence and step length no instability noted No significant cervical call paraspinal tenderness, slight lumbar paraspinal tenderness SLR negative bilaterally FABER and FADIR negative Spurling's negative Normal muscle bulk Questionable increased tone in right lower extremity,.  This is more due to decreased relaxing during exam Facet loading negative  05/24/20 MRI L spine IMPRESSION:    MRI lumbar spine without contrast demonstrating: - At L4-5: pseudodisc bulging, facet hypertrophy, right posterior lateral bone spurring resulting in severe spinal stenosis and mild bilateral foraminal stenosis.  This is a new finding compared to 2019. - At L5-S1: Disc bulging and facet hypertrophy with moderate right and severe left foraminal stenosis.    Assessment & Plan:   Chronic lower back pain with muscle spasms in her right lower extremity -History of L4-L5 facet hypertrophy and severe spinal stenosis, mild bilateral foraminal stenosis, L5-S1 facet hypertrophy with moderate right and severe left foraminal stenosis Relapsing remitting MS followed by neurology -Questionable hypertonia in right lower extremity -She is currently taking baclofen 20  mg 3 times daily, will increase evening dose to 30 mg -Consider tramadol 50 mg twice daily pending results of UDS, opiate agreement completed today also -Pain has not been well-controlled with more conservative treatments -ORT moderate  Multiple Sclerosis, Relapsing and Remitting -Continue to follow-up with neurology  07/20/22 called to f/u, left discrete VM

## 2022-05-23 LAB — TOXASSURE SELECT,+ANTIDEPR,UR

## 2022-05-24 ENCOUNTER — Other Ambulatory Visit: Payer: Self-pay | Admitting: Cardiovascular Disease

## 2022-05-30 ENCOUNTER — Telehealth: Payer: Self-pay | Admitting: Cardiovascular Disease

## 2022-05-30 MED ORDER — METOPROLOL TARTRATE 25 MG PO TABS: ORAL_TABLET | ORAL | 3 refills | Status: DC

## 2022-05-30 NOTE — Telephone Encounter (Signed)
Was unable to reach patient Kaylee Bryant for patient to give the office a call.

## 2022-05-30 NOTE — Telephone Encounter (Signed)
*  STAT* If patient is at the pharmacy, call can be transferred to refill team.   1. Which medications need to be refilled? (please list name of each medication and dose if known) metoprolol tartrate (LOPRESSOR) 25 MG tablet   2. Which pharmacy/location (including street and city if local pharmacy) is medication to be sent to? [id number] [place], [place] - [address]   3. Do they need a 30 day or 90 day supply? 90 Day

## 2022-06-26 ENCOUNTER — Telehealth: Payer: Self-pay | Admitting: Cardiovascular Disease

## 2022-06-26 NOTE — Telephone Encounter (Signed)
*  STAT* If patient is at the pharmacy, call can be transferred to refill team.   1. Which medications need to be refilled? (please list name of each medication and dose if known)  Lisinopril 10 mg instruction  take 2 times a day  2. Which pharmacy/location (including street and city if local pharmacy) is medication to be sent to? [facility] [address] [place]  3. Do they need a 30 day or 90 day supply? #180 and refills

## 2022-06-28 ENCOUNTER — Encounter: Payer: Managed Care, Other (non HMO) | Admitting: Physical Medicine & Rehabilitation

## 2022-07-02 ENCOUNTER — Other Ambulatory Visit: Payer: Self-pay | Admitting: Physical Medicine & Rehabilitation

## 2022-07-09 ENCOUNTER — Telehealth: Payer: Self-pay | Admitting: Cardiovascular Disease

## 2022-07-09 MED ORDER — LISINOPRIL 10 MG PO TABS: 10.0000 mg | ORAL_TABLET | Freq: Two times a day (BID) | ORAL | 11 refills | Status: DC

## 2022-07-09 NOTE — Telephone Encounter (Signed)
Spoke with patient and she states she is supposed to take lisinopril 10mg  bid. The order in med list states 10mg  once daily. Can you please clarify. I can see in her discharge summary by pcp it states she was to take 10mg  of lisinopril bid and to keep appointment for more refills. This Rx from march was ordered by you.

## 2022-07-09 NOTE — Telephone Encounter (Signed)
Pt c/o medication issue:  1. Name of Medication: lisinopril (ZESTRIL) 10 MG tablet   2. How are you currently taking this medication (dosage and times per day)? Take 2 tablet (10 mg total) by mouth daily.   3. Are you having a reaction (difficulty breathing--STAT)? No   4. What is your medication issue? Patient says that she is supposed to take medication twice a day instead one one a day

## 2022-07-11 NOTE — Telephone Encounter (Signed)
Called patient she is aware Rx for lisinopril was sent to pharmacy on file.

## 2022-07-12 ENCOUNTER — Ambulatory Visit: Payer: Managed Care, Other (non HMO) | Admitting: Family Medicine

## 2022-07-16 ENCOUNTER — Other Ambulatory Visit: Payer: Self-pay | Admitting: Family Medicine

## 2022-07-16 DIAGNOSIS — E1169 Type 2 diabetes mellitus with other specified complication: Secondary | ICD-10-CM

## 2022-07-16 DIAGNOSIS — F339 Major depressive disorder, recurrent, unspecified: Secondary | ICD-10-CM

## 2022-07-19 ENCOUNTER — Ambulatory Visit: Payer: Managed Care, Other (non HMO) | Admitting: Family Medicine

## 2022-07-20 NOTE — Telephone Encounter (Signed)
Called to f/u on visit, left discrete VM

## 2022-08-23 ENCOUNTER — Ambulatory Visit (INDEPENDENT_AMBULATORY_CARE_PROVIDER_SITE_OTHER): Payer: Managed Care, Other (non HMO) | Admitting: Family Medicine

## 2022-08-23 ENCOUNTER — Encounter: Payer: Self-pay | Admitting: Family Medicine

## 2022-08-23 VITALS — BP 128/78 | HR 78 | Temp 97.4°F | Ht 63.0 in | Wt 174.4 lb

## 2022-08-23 DIAGNOSIS — E1169 Type 2 diabetes mellitus with other specified complication: Secondary | ICD-10-CM | POA: Diagnosis not present

## 2022-08-23 DIAGNOSIS — G35 Multiple sclerosis: Secondary | ICD-10-CM

## 2022-08-23 DIAGNOSIS — E1159 Type 2 diabetes mellitus with other circulatory complications: Secondary | ICD-10-CM | POA: Diagnosis not present

## 2022-08-23 DIAGNOSIS — Z951 Presence of aortocoronary bypass graft: Secondary | ICD-10-CM | POA: Diagnosis not present

## 2022-08-23 DIAGNOSIS — I152 Hypertension secondary to endocrine disorders: Secondary | ICD-10-CM

## 2022-08-23 DIAGNOSIS — Z1212 Encounter for screening for malignant neoplasm of rectum: Secondary | ICD-10-CM

## 2022-08-23 DIAGNOSIS — F5101 Primary insomnia: Secondary | ICD-10-CM

## 2022-08-23 DIAGNOSIS — Z794 Long term (current) use of insulin: Secondary | ICD-10-CM

## 2022-08-23 DIAGNOSIS — F339 Major depressive disorder, recurrent, unspecified: Secondary | ICD-10-CM

## 2022-08-23 DIAGNOSIS — E785 Hyperlipidemia, unspecified: Secondary | ICD-10-CM

## 2022-08-23 DIAGNOSIS — Z1211 Encounter for screening for malignant neoplasm of colon: Secondary | ICD-10-CM

## 2022-08-23 LAB — BAYER DCA HB A1C WAIVED: HB A1C (BAYER DCA - WAIVED): 7.2 % — ABNORMAL HIGH (ref 4.8–5.6)

## 2022-08-23 MED ORDER — TRAZODONE HCL 50 MG PO TABS
25.0000 mg | ORAL_TABLET | Freq: Every evening | ORAL | 3 refills | Status: DC | PRN
Start: 2022-08-23 — End: 2022-09-14

## 2022-08-23 MED ORDER — TIRZEPATIDE 12.5 MG/0.5ML ~~LOC~~ SOAJ
12.5000 mg | SUBCUTANEOUS | 3 refills | Status: DC
Start: 2022-08-23 — End: 2022-11-23

## 2022-08-23 NOTE — Patient Instructions (Signed)

## 2022-08-23 NOTE — Progress Notes (Deleted)
No chief complaint on file.   HISTORY OF PRESENT ILLNESS:  08/23/22 ALL:  Kaylee Bryant is a 57 y.o. female here today for follow up for  RRMS. She was last seen by Dr Terrace Arabia 04/2020. She continues Ocrevuc infusions. Last infusion in April.   She has complaints of worsening muscle spasms over the past year. She reports pain can be in either side and radiates to the lateral thigh and down the leg. She describes an intense squeezing sensation. She is taking baclofen 20mg  TID, tizanidine 2mg  TID (usually 2mg  in am and 4mg  at bedtime) and using a TENS unit and OTC muscle rubs. She is taking ibuprofen 800mg  2-3 times and Tylenol 1000mg  1-2 times daily. She has taken hydrocodone from a dental procedure that seemed to help. She is also taking gabapentin 600mg  TID for spasticity. She wears compression stockings at work. No changes in gait. She walks about [id number] steps per day. She does not use an assistive device.   She reports that she does not sleep well. She reports spasms keep her awake, however, she has never slept well. She is always tired. She feels she never has energy for anything other than work. She was previously taking Ritalin but discontinued due to cardiac history. She denies depression or anxiety. She feels mood is good. She is taking venlafaxine 225mg  daily. She has a good support system with husband. She reports that she does not eat during the day. She usually eats dinner after getting home from work. She drinks about 60 ounces of water daily.   She called the office 08/15/2021 reporting sudden onset of dizziness, right veering gait, right sided blurred vision, generalized weakness and slurred speech. She was advised to proceed to the ER in which shs did but per ER notes left before having MRI due to pain. She returned to the ER 08/17/2021 for MRI. MRI findings were unremarkable. No new demyelination.   HISTORY (copied from Dr Zannie Cove previous note)  She was diagnosed with RRMS in 2007, 6  month post partum, she had numbness from waist done, diagnosis was confirmed by marked abnormal MRI brain, in March,2007. There were multiple T2/Flair periventricular lesions, corpus callosum involvement, mutiple ring enhancing lesions. Also had enhancing lesion in cervical and thoracic spines. She was treated at [facility] by Dr. Tinnie Gens, no csf study, but had extensive lab evaluations to rule out mimics. 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 She denies visual changes or other clear relapsing episodes.    MRI brain  in 01/2007, multiple T2/FLAIR lesions in both hemisphere with corpus callosum involvement, there are slight increase in lesion load. MRI cervical, stable lesions in C6-7, and C2-3. Thoracic, multiple non enhancing lesions.    She tolerated Rebif well, no flu like illness, but complains of bilateral arm, and the lateral thigh lipoatrophy with repeat Rebif injection. There was no clear relapsed over the years, but she has slow worsening gait difficulty over the past 6 months.  MRI in Sept 2012, MRI of thoracic spine Multiple chronic plaques at C7, T6-7, T8-9, T10.   MRI of cervical spine ,Multiple chronic demyelinating plaques at C3, C4 and C7. MRI of brain Multiple round and ovioid, periventricular and subcortical chronic demyelinating plaques. 2 plaques noted in the pons. No abnormal  lesions are seen on post contrast views.  Her JC-virus antibody was negative. After discuss, we started her on Tysarbri infusion since February of 2013. She tolerated the infusion very well, but complains of excessive fatigue over the past few months, she still able to work full-time as a Engineer, civil (consulting), she complains of difficulty dragging herself out of bed. Modafinil was not approved by  insurance.She was put on Ritalin 10mg  tid, which helped her fatigue. feels much better on Tysabri.   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, with short staffed, she has to work extra hours, does complains of fatigue, on polypharmacy treatment, including Effexor XR 75 mg daily, baclofen 10 mg 2 tablets 3 times a day, tizanidine 2 mg 3 times a day, also on gabapentin 600 mg 3 times a day   Recently she has intermittent sharp radiating pain at right face from the right forehead to break in the eye corner, to below cheek area, gabapentin provide limited help,   Last MRI of the brain was in November 2020, personally reviewed the films, MRI of the brain multiple round and oval periventricular, subcortical, pontine chronic demyelinating plaques, no acute abnormality, no change from MRI compared with January 2019, MRI of cervical and thoracic spine showed chronic demyelinating plaque at C2, C3, 4, and C6- C7, T6-7, T9, T10, T12 and L1, no enhancement   REVIEW OF SYSTEMS: Out of a complete 14 system review of symptoms, the patient complains only of the following symptoms, and all other reviewed systems are negative.   ALLERGIES: No Known Allergies   HOME MEDICATIONS: Outpatient Medications Prior to Visit  Medication Sig Dispense Refill   acetaminophen (TYLENOL) 500 MG tablet Take 1,000 mg by mouth every 6 (six) hours as needed for mild pain.     aspirin EC 81 MG tablet Take 81 mg by mouth daily.     atorvastatin (LIPITOR) 80 MG tablet TAKE 1 TABLET BY MOUTH DAILY AT 6 PM. 90 tablet 3   baclofen (LIORESAL) 10 MG tablet TAKE 2 TABLETS BY MOUTH 3 TIMES A DAY 180 tablet 11   baclofen (LIORESAL) 10 MG tablet  TAKE 1 TABLET BY MOUTH EVERY DAY 90 tablet 1   Continuous Blood Gluc Receiver (FREESTYLE LIBRE 2 READER) DEVI 1 Device by Does not apply route 4 (four) times daily. 1 each 0   Continuous Blood Gluc Sensor (FREESTYLE LIBRE 2 SENSOR) MISC USE TO CHECK SUGAR 4 (FOUR) TIMES DAILY. DX E11.65 6 each 3   cyclobenzaprine (FLEXERIL) 5 MG tablet Take 1 tablet (5 mg total) by mouth 2 (two) times daily. 180 tablet 3   gabapentin (NEURONTIN) 300 MG capsule Take 2 capsules (600 mg total) by mouth 3 (three) times daily. 540 capsule 3   lisinopril (ZESTRIL) 10 MG tablet Take 1 tablet (10 mg total) by mouth 2 (two) times daily. 60 tablet 11   metFORMIN (GLUCOPHAGE) 500 MG tablet TAKE 1 TABLET BY MOUTH 2 TIMES DAILY WITH A MEAL. 180 tablet 0   metoprolol tartrate (LOPRESSOR) 25 MG tablet TAKE 1 TABLET BY MOUTH 2 TIMES DAILY. 180 tablet 3   ocrelizumab (OCREVUS) 300 MG/10ML injection Inject 20 mLs (600 mg total) into the vein every 6 (six) months. 20 mL  0   tirzepatide (MOUNJARO) 12.5 MG/0.5ML Pen Inject 12.5 mg into the skin once a week. 6 mL 3   traZODone (DESYREL) 50 MG tablet Take 0.5-1 tablets (25-50 mg total) by mouth at bedtime as needed for sleep. 30 tablet 3   venlafaxine XR (EFFEXOR-XR) 150 MG 24 hr capsule TAKE 1 CAPSULE BY MOUTH DAILY WITH BREAKFAST. 90 capsule 0   venlafaxine XR (EFFEXOR-XR) 75 MG 24 hr capsule TAKE 1 CAPSULE BY MOUTH DAILY WITH BREAKFAST. 90 capsule 0   No facility-administered medications prior to visit.     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   LIMA to LAD off pump     PAST SURGICAL HISTORY: Past Surgical History:  Procedure Laterality Date   CARDIAC CATHETERIZATION N/A 04/05/2015   Procedure: Left Heart Cath and Coronary Angiography;  Surgeon: Lennette Bihari, MD;  Location: [facility] IN[facility]ASIVE [facility];  Service: Cardiovascular;  Laterality: N/A;   CERVICAL CONE BIOPSY  2004   CESAREAN SECTION     1996, 2006   CHOLECYSTECTOMY     1988   COLPOSCOPY  2004   CORONARY ARTERY BYPASS GRAFT N/A 04/07/2015   Procedure: CORONARY ARTERY BYPASS GRAFTING (CABG), OFF PUMP, TIMES ONE, USING LEFT INTERNAL MAMMARY ARTERY;  Surgeon: Purcell Nails, MD;  Location: [facility] OR;  Service: Open Heart Surgery;  Laterality: N/A;  LIMA to LAD   TEE WITHOUT CARDIOVERSION N/A 04/07/2015   Procedure: TRANSESOPHAGEAL ECHOCARDIOGRAM (TEE);  Surgeon: Purcell Nails, MD;  Location: [facility] OR;  Service: Open Heart Surgery;  Laterality: N/A;     FAMILY HISTORY: Family History  Problem Relation Age of Onset   Diabetes Mother    Hypertension Mother    Hypertension Father    Atrial fibrillation Father    Diabetes Sister    Breast cancer Maternal Aunt    Arrhythmia Sister    Heart attack Maternal Grandfather    Stroke Neg Hx      SOCIAL HISTORY: Social History   Socioeconomic History   Marital status: Married    Spouse name: Not on file   Number of children: 4   Years of education: college   Highest education level: Not on file  Occupational History   Occupation: Teacher, adult education: [employer]  Tobacco Use   Smoking status: Former    Packs/day: 1.50    Years: 15.00    Additional pack years: 0.00    Total pack years: 22.50    Types: Cigarettes    Quit date: 02/03/2004    Years since quitting: 18.5   Smokeless tobacco: Never  Substance and Sexual Activity   Alcohol use: Yes    Alcohol/week: 1.0 standard drink of alcohol    Types: 1 Glasses of wine per week   Drug use: No   Sexual activity: Yes    Partners: Male    Birth  control/protection: Surgical    Comment: vasectomy  Other Topics Concern   Not  on file  Social History Narrative   Patient lives at home with her husband and children. Patient works for the [employer] in [place]. College education.    Social Determinants of Health   Financial Resource Strain: Not on file  Food Insecurity: Not on file  Transportation Needs: Not on file  Physical Activity: Not on file  Stress: Not on file  Social Connections: Not on file  Intimate Partner Violence: Not on file     PHYSICAL EXAM  There were no vitals filed for this visit.  There is no height or weight on file to calculate BMI.  Generalized: Well developed, in no acute distress  Cardiology: normal rate and rhythm, no murmur auscultated  Respiratory: clear to auscultation bilaterally    Neurological examination  Mentation: Alert oriented to time, place, history taking. Follows all commands speech and language fluent Cranial nerve II-XII: Pupils were equal round reactive to light. Extraocular movements were full, visual field were full on confrontational test. Facial sensation and strength were normal. Head turning and shoulder shrug  were normal and symmetric. Motor: The motor testing reveals 5 over 5 strength of all 4 extremities. Good symmetric motor tone is noted throughout.  Sensory: Sensory testing is intact to soft touch on all 4 extremities. No evidence of extinction is noted.  Coordination: Cerebellar testing reveals good finger-nose-finger and heel-to-shin bilaterally.  Gait and station: Gait is normal.  Reflexes: Deep tendon reflexes are symmetric and normal bilaterally.    DIAGNOSTIC DATA (LABS, IMAGING, TESTING) - I reviewed patient records, labs, notes, testing and imaging myself where available.  Lab Results  Component Value Date   WBC 6.3 10/13/2021   HGB 12.7 10/13/2021   HCT 37.7 10/13/2021   MCV 91 10/13/2021   PLT 323 10/13/2021      Component Value Date/Time   NA 139  04/19/2022 0848   K 4.3 04/19/2022 0848   CL 101 04/19/2022 0848   CO2 21 04/19/2022 0848   GLUCOSE 190 (H) 04/19/2022 0848   GLUCOSE 216 (H) 08/17/2021 1120   BUN 12 04/19/2022 0848   CREATININE 0.68 04/19/2022 0848   CREATININE 0.65 06/02/2013 1640   CALCIUM 8.9 04/19/2022 0848   PROT 6.5 04/19/2022 0848   ALBUMIN 4.4 04/19/2022 0848   AST 16 04/19/2022 0848   ALT 18 04/19/2022 0848   ALKPHOS 72 04/19/2022 0848   BILITOT 0.3 04/19/2022 0848   GFRNONAA >60 08/15/2021 1635   GFRAA 100 05/02/2020 0809   Lab Results  Component Value Date   CHOL 114 10/13/2021   HDL 42 10/13/2021   LDLCALC 56 10/13/2021   TRIG 80 10/13/2021   CHOLHDL 2.7 10/13/2021   Lab Results  Component Value Date   HGBA1C 7.2 (H) 08/23/2022   Lab Results  Component Value Date   VITAMINB12 498 08/09/2015   Lab Results  Component Value Date   TSH 0.813 10/13/2021        No data to display               No data to display           ASSESSMENT AND PLAN  57 y.o. year old female  has a past medical history of Abnormal Pap smear (2004), Coronary artery disease involving native coronary artery with unstable angina pectoris (HCC) (04/05/2015), DDD (degenerative disc disease), lumbar, DM (diabetes mellitus), gestational (2006), Fatigue, Heart attack (HCC), IBS (irritable bowel syndrome), Multiple sclerosis (HCC), Obesity, and S/P CABG x 1 (04/07/2015). here with  No diagnosis found.  Kaylee Bryant contnues to tolerate Ocrevus. We will continue infusions every 6 months. I will update labs, today. We have discussed worsening muscle cramps and fatigue. I am concerned about her diet and sleep habits contributing to fatigue and pain. I do not recommend stimulants due to her cardiac history. She does not feel that tizanidine helps much at all. I will switch her to cyclobenzaprine but advised of possible sedative side effects. She will start with 5mg  at bedtime. May increase to 5mg  twice daily if needed.  She will discontinue tizanidine. I would prefer to avoid opiates if possible. We have discussed referral to pain management if needed. I will assess CK and mag level. Will also check vitamin D level. Sleep hygiene advised. She was encouraged to eat regualr well balanced meals during the day. She will follow up with PCP as directed. She will return to Dr Terrace Arabia in 4-6 months, sooner if needed. She verbalizes understanding and agreement with this plan.    No orders of the defined types were placed in this encounter.    No orders of the defined types were placed in this encounter.    Shawnie Dapper, MSN, FNP-C 08/23/2022, 4:06 PM  [facility] [address], [address] [place], [place] [address] [phone]

## 2022-08-23 NOTE — Progress Notes (Signed)
Subjective:  Patient ID: Kaylee Bryant, female    DOB: 1965-12-13, 57 y.o.   MRN: [id number]  Patient Care Team: Sonny Masters, FNP as PCP - General (Family Medicine)   Chief Complaint:  Diabetes (3 month follow up ) and trouble sleeping (Patient states it has been on going but has gotten worse the older she has got. )   HPI: Kaylee Bryant is a 57 y.o. female presenting on 08/23/2022 for Diabetes (3 month follow up ) and trouble sleeping (Patient states it has been on going but has gotten worse the older she has got. )    1. Type 2 diabetes mellitus with other specified complication, without long-term current use of insulin (HCC) Currently on Mounjaro and tolerating well. She denies polyuria, polyphagia, or polydipsia.   2. Hypertension associated with type 2 diabetes mellitus (HCC) Complaint with meds - Yes Current Medications - lisinopril, lopressor Checking BP at home - no Exercising Regularly - No Watching Salt intake - Yes Pertinent ROS:  Headache - No Fatigue - Yes Visual Disturbances - No Chest pain - No Dyspnea - No Palpitations - No LE edema - No They report good compliance with medications and can restate their regimen by memory. No medication side effects.  BP Readings from Last 3 Encounters:  08/23/22 128/78  05/17/22 (!) 146/95  05/04/22 128/76     3. Hyperlipidemia associated with type 2 diabetes mellitus (HCC) Compliant with medications - Yes Current medications - lipitor Side effects from medications - No Diet - generally healthy Exercise - not regular   4. Depression, recurrent (HCC) Has been taking medications as prescribed. Reports she is doing well symptom wise but is having more difficulty sleeping. States she has tried Zquil and Melatonin to help with sleep but this has not been beneficial.     08/23/2022   10:55 AM 05/17/2022    9:10 AM 04/19/2022    9:13 AM 01/16/2022    8:39 AM 01/16/2022    8:33 AM  Depression screen PHQ 2/9   Decreased Interest 0 2 0  0  Down, Depressed, Hopeless 0 0 1  0  PHQ - 2 Score 0 2 1  0  Altered sleeping 3 3 3 2    Tired, decreased energy 2 3 2 2    Change in appetite 3 2 3 3    Feeling bad or failure about yourself  0 1 0 0   Trouble concentrating 2 2 1 2    Moving slowly or fidgety/restless 0 0 0 0   Suicidal thoughts 0 0 0 0   PHQ-9 Score 10 13 10     Difficult doing work/chores Somewhat difficult  Somewhat difficult Somewhat difficult       08/23/2022   10:55 AM 04/19/2022    9:13 AM 01/16/2022    8:33 AM 10/13/2021    8:25 AM  GAD 7 : Generalized Anxiety Score  Nervous, Anxious, on Edge 0 0 0 0  Control/stop worrying 0 0 0 0  Worry too much - different things 0 1 0 0  Trouble relaxing 3 1 3 1   Restless 0 0 0 0  Easily annoyed or irritable 2 0 0 0  Afraid - awful might happen 0 0 0 0  Total GAD 7 Score 5 2 3 1   Anxiety Difficulty Somewhat difficult Somewhat difficult Somewhat difficult Somewhat difficult    5. S/P CABG x 1 No chest pain, leg swelling, shortness of breath, worsening fatigue, palpitations or shortness  of breath. Is followed by cardiology on a regular basis.   6. Multiple sclerosis (HCC) On Ocrevus every 6 months and feels this is working well. Followed by neurology on a regular basis.      Relevant past medical, surgical, family, and social history reviewed and updated as indicated.  Allergies and medications reviewed and updated. Data reviewed: Chart in Epic.   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   LIMA to LAD off pump    Past  Surgical History:  Procedure Laterality Date   CARDIAC CATHETERIZATION N/A 04/05/2015   Procedure: Left Heart Cath and Coronary Angiography;  Surgeon: Lennette Bihari, MD;  Location: [facility] INVASIVE CV LAB;  Service: Cardiovascular;  Laterality: N/A;   CERVICAL CONE BIOPSY  2004   CESAREAN SECTION     1996, 2006   CHOLECYSTECTOMY     1988   COLPOSCOPY  2004   CORONARY ARTERY BYPASS GRAFT N/A 04/07/2015   Procedure: CORONARY ARTERY BYPASS GRAFTING (CABG), OFF PUMP, TIMES ONE, USING LEFT INTERNAL MAMMARY ARTERY;  Surgeon: Purcell Nails, MD;  Location: [facility] OR;  Service: Open Heart Surgery;  Laterality: N/A;  LIMA to LAD   TEE WITHOUT CARDIOVERSION N/A 04/07/2015   Procedure: TRANSESOPHAGEAL ECHOCARDIOGRAM (TEE);  Surgeon: Purcell Nails, MD;  Location: [facility] OR;  Service: Open Heart Surgery;  Laterality: N/A;    Social History   Socioeconomic History   Marital status: Married    Spouse name: Not on file   Number of children: 4   Years of education: college   Highest education level: Not on file  Occupational History   Occupation: Teacher, adult education: [employer]  Tobacco Use   Smoking status: Former    Packs/day: 1.50    Years: 15.00    Additional pack years: 0.00    Total pack years: 22.50    Types: Cigarettes    Quit date: 02/03/2004    Years since quitting: 18.5   Smokeless tobacco: Never  Substance and Sexual Activity   Alcohol use: Yes    Alcohol/week: 1.0 standard drink of alcohol    Types: 1 Glasses of wine per week   Drug use: No   Sexual activity: Yes    Partners: Male    Birth control/protection: Surgical    Comment: vasectomy  Other Topics Concern   Not on file  Social History Narrative   Patient lives at home with her husband and children. Patient works for the [employer] in [place]. College education.    Social Determinants of Health   Financial Resource Strain: Not on file  Food Insecurity: Not on file  Transportation Needs: Not on file  Physical  Activity: Not on file  Stress: Not on file  Social Connections: Not on file  Intimate Partner Violence: Not on file    Outpatient Encounter Medications as of 08/23/2022  Medication Sig   acetaminophen (TYLENOL) 500 MG tablet Take 1,000 mg by mouth every 6 (six) hours as  needed for mild pain.   aspirin EC 81 MG tablet Take 81 mg by mouth daily.   atorvastatin (LIPITOR) 80 MG tablet TAKE 1 TABLET BY MOUTH DAILY AT 6 PM.   baclofen (LIORESAL) 10 MG tablet TAKE 2 TABLETS BY MOUTH 3 TIMES A DAY   baclofen (LIORESAL) 10 MG tablet TAKE 1 TABLET BY MOUTH EVERY DAY   Continuous Blood Gluc Receiver (FREESTYLE LIBRE 2 READER) DEVI 1 Device by Does not apply route 4 (four) times daily.   Continuous Blood Gluc Sensor (FREESTYLE LIBRE 2 SENSOR) MISC USE TO CHECK SUGAR 4 (FOUR) TIMES DAILY. DX E11.65   cyclobenzaprine (FLEXERIL) 5 MG tablet Take 1 tablet (5 mg total) by mouth 2 (two) times daily.   gabapentin (NEURONTIN) 300 MG capsule Take 2 capsules (600 mg total) by mouth 3 (three) times daily.   lisinopril (ZESTRIL) 10 MG tablet Take 1 tablet (10 mg total) by mouth 2 (two) times daily.   metFORMIN (GLUCOPHAGE) 500 MG tablet TAKE 1 TABLET BY MOUTH 2 TIMES DAILY WITH A MEAL.   metoprolol tartrate (LOPRESSOR) 25 MG tablet TAKE 1 TABLET BY MOUTH 2 TIMES DAILY.   ocrelizumab (OCREVUS) 300 MG/10ML injection Inject 20 mLs (600 mg total) into the vein every 6 (six) months.   tirzepatide (MOUNJARO) 12.5 MG/0.5ML Pen Inject 12.5 mg into the skin once a week.   traZODone (DESYREL) 50 MG tablet Take 0.5-1 tablets (25-50 mg total) by mouth at bedtime as needed for sleep.   venlafaxine XR (EFFEXOR-XR) 150 MG 24 hr capsule TAKE 1 CAPSULE BY MOUTH DAILY WITH BREAKFAST.   venlafaxine XR (EFFEXOR-XR) 75 MG 24 hr capsule TAKE 1 CAPSULE BY MOUTH DAILY WITH BREAKFAST.   [DISCONTINUED] tirzepatide (MOUNJARO) 7.5 MG/0.5ML Pen Inject 7.5 mg into the skin once a week.   No facility-administered encounter medications on file  as of 08/23/2022.    No Known Allergies  Review of Systems  Constitutional:  Positive for fatigue. Negative for activity change, appetite change, chills, diaphoresis, fever and unexpected weight change.  HENT: Negative.    Eyes: Negative.  Negative for photophobia and visual disturbance.  Respiratory:  Negative for cough, chest tightness and shortness of breath.   Cardiovascular:  Negative for chest pain, palpitations and leg swelling.  Gastrointestinal:  Negative for abdominal pain, blood in stool, constipation, diarrhea, nausea and vomiting.  Endocrine: Negative.  Negative for polydipsia, polyphagia and polyuria.  Genitourinary:  Negative for decreased urine volume, difficulty urinating, dysuria, frequency and urgency.  Musculoskeletal:  Positive for arthralgias and myalgias.  Skin: Negative.   Allergic/Immunologic: Negative.   Neurological:  Negative for dizziness and headaches.  Hematological: Negative.   Psychiatric/Behavioral:  Positive for agitation, decreased concentration and sleep disturbance. Negative for behavioral problems, confusion, dysphoric mood, hallucinations, self-injury and suicidal ideas. The patient is nervous/anxious. The patient is not hyperactive.   All other systems reviewed and are negative.       Objective:  BP 128/78   Pulse 78   Temp (!) 97.4 F (36.3 C) (Temporal)   Ht 5\' 3"  (1.6 m)   Wt 174 lb 6.4 oz (79.1 kg)   LMP 10/03/2014 Comment: last period was approx 6 months ago, "i don't have it anymore"  SpO2 95%   BMI 30.89 kg/m    Wt Readings from Last 3 Encounters:  08/23/22 174 lb 6.4 oz (79.1 kg)  05/17/22 175 lb (79.4 kg)  05/04/22 172 lb 3.2 oz (78.1 kg)    Physical Exam Vitals and nursing note reviewed.  Constitutional:      General: She is not in acute distress.    Appearance: Normal appearance. She is well-developed and well-groomed. She is obese. She is not ill-appearing, toxic-appearing or diaphoretic.  HENT:     Head:  Normocephalic and atraumatic.     Jaw: There is normal jaw occlusion.     Right Ear: Hearing normal.     Left Ear: Hearing normal.     Nose: Nose normal.     Mouth/Throat:     Lips: Pink.     Mouth: Mucous membranes are moist.     Pharynx: Uvula midline.  Eyes:     General: Lids are normal.     Extraocular Movements: Extraocular movements intact.     Conjunctiva/sclera: Conjunctivae normal.     Pupils: Pupils are equal, round, and reactive to light.  Neck:     Thyroid: No thyroid mass, thyromegaly or thyroid tenderness.     Vascular: No carotid bruit or JVD.     Trachea: Trachea and phonation normal.  Cardiovascular:     Rate and Rhythm: Normal rate and regular rhythm.     Chest Wall: PMI is not displaced.     Pulses: Normal pulses.     Heart sounds: Normal heart sounds. No murmur heard.    No friction rub. No gallop.  Pulmonary:     Effort: Pulmonary effort is normal. No respiratory distress.     Breath sounds: Normal breath sounds. No wheezing.  Chest:     Comments: Sternotomy well healed Abdominal:     General: Bowel sounds are normal. There is no abdominal bruit.     Palpations: Abdomen is soft. There is no hepatomegaly or splenomegaly.  Musculoskeletal:        General: Normal range of motion.     Cervical back: Normal range of motion and neck supple.     Right lower leg: No edema.     Left lower leg: No edema.  Lymphadenopathy:     Cervical: No cervical adenopathy.  Skin:    General: Skin is warm and dry.     Capillary Refill: Capillary refill takes less than 2 seconds.     Coloration: Skin is not cyanotic, jaundiced or pale.     Findings: No rash.  Neurological:     General: No focal deficit present.     Mental Status: She is alert and oriented to person, place, and time.     Sensory: Sensation is intact.     Motor: Motor function is intact.     Coordination: Coordination is intact.     Gait: Gait abnormal (slight limp).     Deep Tendon Reflexes: Reflexes are  normal and symmetric.  Psychiatric:        Attention and Perception: Attention and perception normal.        Mood and Affect: Mood and affect normal.        Speech: Speech normal.        Behavior: Behavior normal. Behavior is cooperative.        Thought Content: Thought content normal.        Cognition and Memory: Cognition and memory normal.        Judgment: Judgment normal.     Results for orders placed or performed in visit on 08/23/22  Bayer DCA Hb A1c Waived  Result Value Ref Range   HB A1C (BAYER DCA - WAIVED) 7.2 (H) 4.8 - 5.6 %       Pertinent labs &  imaging results that were available during my care of the patient were reviewed by me and considered in my medical decision making.  Assessment & Plan:  Kaylee Bryant was seen today for diabetes and trouble sleeping.  Diagnoses and all orders for this visit:  Type 2 diabetes mellitus with other specified complication, without long-term current use of insulin (HCC) A1C 7.2 today, will increase Mounjaro dosing to 12.5 mg weekly as pt is tolerating well. Diet and exercise encouraged.  -     Bayer DCA Hb A1c Waived -     tirzepatide (MOUNJARO) 12.5 MG/0.5ML Pen; Inject 12.5 mg into the skin once a week.  Hypertension associated with type 2 diabetes mellitus (HCC) BP well controlled. Changes were not made in regimen today. Goal BP is 130/80. Pt aware to report any persistent high or low readings. DASH diet and exercise encouraged. Exercise at least 150 minutes per week and increase as tolerated. Goal BMI > 25. Stress management encouraged. Avoid nicotine and tobacco product use. Avoid excessive alcohol and NSAID's. Avoid more than 2000 mg of sodium daily. Medications as prescribed. Follow up as scheduled.   Hyperlipidemia associated with type 2 diabetes mellitus (HCC) Diet encouraged - increase intake of fresh fruits and vegetables, increase intake of lean proteins. Bake, broil, or grill foods. Avoid fried, greasy, and fatty foods. Avoid fast  foods. Increase intake of fiber-rich whole grains. Exercise encouraged - at least 150 minutes per week and advance as tolerated.  Goal BMI < 25. Continue medications as prescribed. Follow up in 3-6 months as discussed.   Depression, recurrent (HCC) Doing well on current regimen, continue  S/P CABG x 1 No anginal symptoms. Followed by cardiology on a regular basis.   Multiple sclerosis (HCC) On Ocrevus and tolerating well. Followed by neurology on a regular basis.   Primary insomnia Has tried and failed Zquil and melatonin. Will trial below. Pt aware to start out with 25 mg nightly, can increase to 50 mg nightly if needed. Sleep hygiene discussed in detail.  -     traZODone (DESYREL) 50 MG tablet; Take 0.5-1 tablets (25-50 mg total) by mouth at bedtime as needed for sleep.  Screening for colorectal cancer -     Ambulatory referral to Gastroenterology     Continue all other maintenance medications.  Follow up plan: Return in about 3 months (around 11/23/2022), or if symptoms worsen or fail to improve, for DM.   Continue healthy lifestyle choices, including diet (rich in fruits, vegetables, and lean proteins, and low in salt and simple carbohydrates) and exercise (at least 30 minutes of moderate physical activity daily).  Educational handout given for DM  The above assessment and management plan was discussed with the patient. The patient verbalized understanding of and has agreed to the management plan. Patient is aware to call the clinic if they develop any new symptoms or if symptoms persist or worsen. Patient is aware when to return to the clinic for a follow-up visit. Patient educated on when it is appropriate to go to the emergency department.   Kari Baars, FNP-C [facility] [phone]

## 2022-08-28 ENCOUNTER — Encounter: Payer: Self-pay | Admitting: *Deleted

## 2022-09-03 ENCOUNTER — Ambulatory Visit: Payer: Managed Care, Other (non HMO) | Admitting: Family Medicine

## 2022-09-03 ENCOUNTER — Encounter: Payer: Self-pay | Admitting: Family Medicine

## 2022-09-03 DIAGNOSIS — R5382 Chronic fatigue, unspecified: Secondary | ICD-10-CM

## 2022-09-03 DIAGNOSIS — M48061 Spinal stenosis, lumbar region without neurogenic claudication: Secondary | ICD-10-CM

## 2022-09-03 DIAGNOSIS — G35 Multiple sclerosis: Secondary | ICD-10-CM

## 2022-09-03 DIAGNOSIS — R269 Unspecified abnormalities of gait and mobility: Secondary | ICD-10-CM

## 2022-09-03 DIAGNOSIS — E559 Vitamin D deficiency, unspecified: Secondary | ICD-10-CM

## 2022-09-14 ENCOUNTER — Other Ambulatory Visit: Payer: Self-pay | Admitting: Family Medicine

## 2022-09-14 DIAGNOSIS — F5101 Primary insomnia: Secondary | ICD-10-CM

## 2022-09-19 ENCOUNTER — Telehealth: Payer: Self-pay

## 2022-09-19 DIAGNOSIS — Z0289 Encounter for other administrative examinations: Secondary | ICD-10-CM

## 2022-09-19 NOTE — Telephone Encounter (Signed)
Sent to Dr. Terrace Arabia

## 2022-09-20 NOTE — Telephone Encounter (Signed)
FMLA paperwork completed and signed.

## 2022-10-26 ENCOUNTER — Other Ambulatory Visit: Payer: Self-pay | Admitting: Family Medicine

## 2022-10-26 DIAGNOSIS — E1169 Type 2 diabetes mellitus with other specified complication: Secondary | ICD-10-CM

## 2022-10-26 DIAGNOSIS — F339 Major depressive disorder, recurrent, unspecified: Secondary | ICD-10-CM

## 2022-11-23 ENCOUNTER — Ambulatory Visit (INDEPENDENT_AMBULATORY_CARE_PROVIDER_SITE_OTHER): Payer: Managed Care, Other (non HMO)

## 2022-11-23 ENCOUNTER — Other Ambulatory Visit: Payer: Self-pay | Admitting: Family Medicine

## 2022-11-23 ENCOUNTER — Ambulatory Visit (INDEPENDENT_AMBULATORY_CARE_PROVIDER_SITE_OTHER): Payer: Managed Care, Other (non HMO) | Admitting: Family Medicine

## 2022-11-23 ENCOUNTER — Encounter: Payer: Self-pay | Admitting: Family Medicine

## 2022-11-23 VITALS — BP 136/76 | HR 72 | Temp 97.1°F | Ht 63.0 in | Wt 173.8 lb

## 2022-11-23 DIAGNOSIS — E1169 Type 2 diabetes mellitus with other specified complication: Secondary | ICD-10-CM

## 2022-11-23 DIAGNOSIS — E1159 Type 2 diabetes mellitus with other circulatory complications: Secondary | ICD-10-CM

## 2022-11-23 DIAGNOSIS — F5101 Primary insomnia: Secondary | ICD-10-CM | POA: Diagnosis not present

## 2022-11-23 DIAGNOSIS — M25551 Pain in right hip: Secondary | ICD-10-CM | POA: Diagnosis not present

## 2022-11-23 DIAGNOSIS — K5909 Other constipation: Secondary | ICD-10-CM

## 2022-11-23 DIAGNOSIS — E785 Hyperlipidemia, unspecified: Secondary | ICD-10-CM

## 2022-11-23 DIAGNOSIS — F339 Major depressive disorder, recurrent, unspecified: Secondary | ICD-10-CM

## 2022-11-23 DIAGNOSIS — Z7984 Long term (current) use of oral hypoglycemic drugs: Secondary | ICD-10-CM

## 2022-11-23 DIAGNOSIS — I152 Hypertension secondary to endocrine disorders: Secondary | ICD-10-CM

## 2022-11-23 LAB — BAYER DCA HB A1C WAIVED: HB A1C (BAYER DCA - WAIVED): 6.4 % — ABNORMAL HIGH (ref 4.8–5.6)

## 2022-11-23 MED ORDER — METFORMIN HCL 500 MG PO TABS
500.0000 mg | ORAL_TABLET | Freq: Two times a day (BID) | ORAL | 1 refills | Status: DC
Start: 2022-11-23 — End: 2023-03-28

## 2022-11-23 MED ORDER — VENLAFAXINE HCL ER 150 MG PO CP24
150.0000 mg | ORAL_CAPSULE | Freq: Every day | ORAL | 1 refills | Status: DC
Start: 2022-11-23 — End: 2023-03-28

## 2022-11-23 MED ORDER — TRAZODONE HCL 50 MG PO TABS
50.0000 mg | ORAL_TABLET | Freq: Every day | ORAL | 1 refills | Status: DC
Start: 2022-11-23 — End: 2023-03-28

## 2022-11-23 MED ORDER — TIRZEPATIDE 15 MG/0.5ML ~~LOC~~ SOAJ
15.0000 mg | SUBCUTANEOUS | 3 refills | Status: DC
Start: 2022-11-23 — End: 2023-04-30

## 2022-11-23 MED ORDER — VENLAFAXINE HCL ER 75 MG PO CP24
75.0000 mg | ORAL_CAPSULE | Freq: Every day | ORAL | 1 refills | Status: DC
Start: 2022-11-23 — End: 2023-03-28

## 2022-11-23 NOTE — Progress Notes (Signed)
Subjective:  Patient ID: Kaylee Bryant, female    DOB: Jul 09, 1965, 57 y.o.   MRN: [id number]  Patient Care Team: Sonny Masters, FNP as PCP - General (Family Medicine)   Chief Complaint:  Diabetes (3 month follow up ) and Hip Pain (Right x 6 weeks and has gotten worse )   HPI: Kaylee Bryant is a 57 y.o. female presenting on 11/23/2022 for Diabetes (3 month follow up ) and Hip Pain (Right x 6 weeks and has gotten worse )    1. Type 2 diabetes mellitus with other specified complication, without long-term current use of insulin (HCC) Hypertension associated with diabetes Hyperlipidemia associates with diabetes Pt states she has had some spikes in the mid 200s some evenings. No polyuria polyphagia, or polydipsia. She is currently on Mounjaro 12.5 mg weekly and metformin 500 mg twice daily. No visual changes. Does have numbness in her feet due to MS. States her blood pressure has been well controlled. No chest pain, leg swelling, confusion, or visual changes. Compliant with her statin therapy without associated side effects.   2. Primary insomnia Has been doing fairly well on current medications. No associated side effects.  3. Depression, recurrent (HCC) States she feels she is doing very well on current regimen. No SI or HI. No side effects from medications.     11/23/2022    2:49 PM 08/23/2022   10:55 AM 05/17/2022    9:10 AM 04/19/2022    9:13 AM 01/16/2022    8:39 AM  Depression screen PHQ 2/9  Decreased Interest 0 0 2 0   Down, Depressed, Hopeless 0 0 0 1   PHQ - 2 Score 0 0 2 1   Altered sleeping 2 3 3 3 2   Tired, decreased energy 0 2 3 2 2   Change in appetite 3 3 2 3 3   Feeling bad or failure about yourself  0 0 1 0 0  Trouble concentrating 0 2 2 1 2   Moving slowly or fidgety/restless 0 0 0 0 0  Suicidal thoughts 0 0 0 0 0  PHQ-9 Score 5 10 13 10    Difficult doing work/chores Not difficult at all Somewhat difficult  Somewhat difficult Somewhat difficult      11/23/2022     2:50 PM 08/23/2022   10:55 AM 04/19/2022    9:13 AM 01/16/2022    8:33 AM  GAD 7 : Generalized Anxiety Score  Nervous, Anxious, on Edge 0 0 0 0  Control/stop worrying 0 0 0 0  Worry too much - different things 0 0 1 0  Trouble relaxing 0 3 1 3   Restless 0 0 0 0  Easily annoyed or irritable 0 2 0 0  Afraid - awful might happen 0 0 0 0  Total GAD 7 Score 0 5 2 3   Anxiety Difficulty Not difficult at all Somewhat difficult Somewhat difficult Somewhat difficult    4. Right hip pain Hip Pain: Patient complains of right hip pain. Onset of the symptoms was 6 weeks ago. Inciting event: none. Current symptoms include is aggravated by walking and is worse after period of inactivity. Associated symptoms: none. Aggravating symptoms: going up and down stairs, inactivity, rising after sitting, squatting, standing, and walking. Patient's overall course: stable. Patient has had no prior hip problems. Previous visits for this problem: none. Evaluation to date: none.  Treatment to date: OTC analgesics, which have been somewhat effective.      Relevant past medical, surgical,  family, and social history reviewed and updated as indicated.  Allergies and medications reviewed and updated. Data reviewed: Chart in Epic.   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   LIMA to LAD off pump    Past Surgical History:  Procedure Laterality Date   CARDIAC CATHETERIZATION N/A 04/05/2015   Procedure: Left Heart Cath and Coronary Angiography;  Surgeon: Lennette Bihari, MD;  Location: [facility];  Service: Cardiovascular;  Laterality: N/A;   CERVICAL CONE BIOPSY  2004   CESAREAN SECTION     1996, 2006   CHOLECYSTECTOMY     1988   COLPOSCOPY  2004   CORONARY ARTERY BYPASS GRAFT N/A 04/07/2015   Procedure: CORONARY ARTERY BYPASS GRAFTING (CABG), OFF PUMP, TIMES ONE, USING LEFT INTERNAL MAMMARY ARTERY;  Surgeon: Purcell Nails, MD;  Location: [facility] OR;  Service: Open Heart Surgery;  Laterality: N/A;  LIMA to LAD   TEE WITHOUT CARDIOVERSION N/A 04/07/2015   Procedure: TRANSESOPHAGEAL ECHOCARDIOGRAM (TEE);  Surgeon: Purcell Nails, MD;  Location: [facility] OR;  Service: Open Heart Surgery;  Laterality: N/A;    Social History   Socioeconomic History   Marital status: Married    Spouse name: Not on file   Number of children: 4   Years of education: college   Highest education level: Not on file  Occupational History   Occupation: Teacher, adult education: [employer]  Tobacco Use   Smoking status: Former    Current packs/day: 0.00    Average packs/day: 1.5 packs/day for 15.0 years (22.5 ttl pk-yrs)    Types: Cigarettes    Start date: 02/02/1989    Quit date: 02/03/2004    Years since quitting: 18.8   Smokeless tobacco: Never  Substance and Sexual Activity   Alcohol use: Yes    Alcohol/week: 1.0 standard drink of alcohol    Types: 1 Glasses of wine per week   Drug use: No   Sexual activity: Yes    Partners: Male    Birth control/protection: Surgical    Comment: vasectomy  Other Topics Concern   Not on file  Social History Narrative   Patient lives at home with her husband and children. Patient works for the [employer] in [place]. College education.    Social Determinants of Health   Financial Resource Strain: Not on file  Food Insecurity: Not on file  Transportation Needs: Not on file  Physical Activity: Not on file  Stress: Not on file  Social Connections: Not on file  Intimate Partner Violence: Not on file    Outpatient Encounter Medications as of 11/23/2022   Medication Sig   acetaminophen (TYLENOL) 500 MG tablet Take 1,000 mg by mouth every 6 (six) hours as needed for mild pain.   aspirin EC 81 MG tablet Take 81 mg by mouth daily.   atorvastatin (LIPITOR) 80 MG tablet TAKE 1 TABLET BY MOUTH DAILY AT 6 PM.   baclofen (LIORESAL) 10 MG tablet TAKE 2  TABLETS BY MOUTH 3 TIMES A DAY   Continuous Blood Gluc Receiver (FREESTYLE LIBRE 2 READER) DEVI 1 Device by Does not apply route 4 (four) times daily.   Continuous Blood Gluc Sensor (FREESTYLE LIBRE 2 SENSOR) MISC USE TO CHECK SUGAR 4 (FOUR) TIMES DAILY. DX E11.65   cyclobenzaprine (FLEXERIL) 5 MG tablet Take 1 tablet (5 mg total) by mouth 2 (two) times daily.   gabapentin (NEURONTIN) 300 MG capsule Take 2 capsules (600 mg total) by mouth 3 (three) times daily.   lisinopril (ZESTRIL) 10 MG tablet Take 1 tablet (10 mg total) by mouth 2 (two) times daily.   metoprolol tartrate (LOPRESSOR) 25 MG tablet TAKE 1 TABLET BY MOUTH 2 TIMES DAILY.   ocrelizumab (OCREVUS) 300 MG/10ML injection Inject 20 mLs (600 mg total) into the vein every 6 (six) months.   tirzepatide (MOUNJARO) 15 MG/0.5ML Pen Inject 15 mg into the skin once a week.   [DISCONTINUED] metFORMIN (GLUCOPHAGE) 500 MG tablet TAKE 1 TABLET BY MOUTH TWICE A DAY WITH FOOD   [DISCONTINUED] tirzepatide (MOUNJARO) 12.5 MG/0.5ML Pen Inject 12.5 mg into the skin once a week.   [DISCONTINUED] traZODone (DESYREL) 50 MG tablet TAKE 0.5-1 TABLETS BY MOUTH AT BEDTIME AS NEEDED FOR SLEEP.   [DISCONTINUED] venlafaxine XR (EFFEXOR-XR) 150 MG 24 hr capsule TAKE 1 CAPSULE BY MOUTH DAILY WITH BREAKFAST.   [DISCONTINUED] venlafaxine XR (EFFEXOR-XR) 75 MG 24 hr capsule TAKE 1 CAPSULE BY MOUTH DAILY WITH BREAKFAST.   metFORMIN (GLUCOPHAGE) 500 MG tablet Take 1 tablet (500 mg total) by mouth 2 (two) times daily with a meal.   traZODone (DESYREL) 50 MG tablet Take 1 tablet (50 mg total) by mouth at bedtime.   venlafaxine XR (EFFEXOR-XR) 150 MG 24 hr capsule Take 1 capsule (150  mg total) by mouth daily with breakfast.   venlafaxine XR (EFFEXOR-XR) 75 MG 24 hr capsule Take 1 capsule (75 mg total) by mouth daily with breakfast.   [DISCONTINUED] baclofen (LIORESAL) 10 MG tablet TAKE 1 TABLET BY MOUTH EVERY DAY   No facility-administered encounter medications on file as of 11/23/2022.    No Known Allergies  Review of Systems  Constitutional:  Positive for appetite change and fatigue. Negative for activity change, chills, diaphoresis, fever and unexpected weight change.  Eyes:  Negative for photophobia and visual disturbance.  Respiratory:  Negative for cough and shortness of breath.   Cardiovascular:  Negative for chest pain, palpitations and leg swelling.  Gastrointestinal:  Positive for constipation. Negative for abdominal pain, diarrhea, nausea and vomiting.  Endocrine: Negative for cold intolerance, heat intolerance, polydipsia, polyphagia and polyuria.  Genitourinary:  Negative for decreased urine volume and difficulty urinating.  Musculoskeletal:  Positive for arthralgias and gait problem.  Neurological:  Positive for weakness (MS - intermittent). Negative for dizziness, tremors, seizures, syncope, facial asymmetry, speech difficulty, light-headedness and headaches.  Psychiatric/Behavioral:  Positive for sleep disturbance. Negative for confusion.   All other systems reviewed and are negative.       Objective:  BP 136/76   Pulse 72   Temp (!) 97.1 F (36.2 C) (Temporal)   Ht 5\' 3"  (1.6 m)   Wt 173 lb 12.8 oz (78.8 kg)   LMP 10/03/2014 Comment: last period was approx 6 months ago, "i don't have it anymore"  SpO2 94%   BMI 30.79 kg/m    Wt Readings from Last 3 Encounters:  11/23/22 173 lb 12.8 oz (78.8 kg)  08/23/22 174 lb 6.4 oz (79.1 kg)  05/17/22 175 lb (  79.4 kg)    Physical Exam Vitals and nursing note reviewed.  Constitutional:      Appearance: Normal appearance. She is obese.  HENT:     Head: Normocephalic and atraumatic.     Nose: Nose  normal.     Mouth/Throat:     Mouth: Mucous membranes are moist.     Pharynx: Oropharynx is clear.  Eyes:     Conjunctiva/sclera: Conjunctivae normal.     Pupils: Pupils are equal, round, and reactive to light.  Cardiovascular:     Rate and Rhythm: Normal rate and regular rhythm.     Pulses:          Dorsalis pedis pulses are 2+ on the right side and 2+ on the left side.       Posterior tibial pulses are 2+ on the right side and 2+ on the left side.     Heart sounds: Normal heart sounds.  Pulmonary:     Effort: Pulmonary effort is normal.     Breath sounds: Normal breath sounds.  Musculoskeletal:     Cervical back: Neck supple.     Lumbar back: Normal.     Right hip: Tenderness present. Decreased range of motion.     Right upper leg: Normal.  Feet:     Right foot:     Protective Sensation: 10 sites tested.  6 sites sensed.     Skin integrity: Skin integrity normal.     Toenail Condition: Fungal disease present.    Left foot:     Protective Sensation: 10 sites tested.  6 sites sensed.     Skin integrity: Skin integrity normal.     Toenail Condition: Fungal disease present.    Comments: Right second toe drop Skin:    General: Skin is warm and dry.     Capillary Refill: Capillary refill takes less than 2 seconds.  Neurological:     General: No focal deficit present.     Mental Status: She is alert and oriented to person, place, and time.     Gait: Gait abnormal (antalgic, favoring right hip).  Psychiatric:        Mood and Affect: Mood normal.        Behavior: Behavior normal.        Thought Content: Thought content normal.        Judgment: Judgment normal.     Results for orders placed or performed in visit on 11/23/22  Bayer DCA Hb A1c Waived  Result Value Ref Range   HB A1C (BAYER DCA - WAIVED) 6.4 (H) 4.8 - 5.6 %     X-Ray: right FAO:[id number] changes, moderate stool burden. No acute findings. Preliminary x-ray reading by Kari Baars, FNP-C,  [facility].   Pertinent labs & imaging results that were available during my care of the patient were reviewed by me and considered in my medical decision making.  Assessment & Plan:  Denesha was seen today for diabetes and hip pain.  Diagnoses and all orders for this visit:  Type 2 diabetes mellitus with other specified complication, without long-term current use of insulin (HCC) A1C 6.4 today, increase Mounjaro to 15 mg weekly. Diet and exercise encouraged.  -     Bayer DCA Hb A1c Waived -     CMP14+EGFR -     Vitamin B12 -     metFORMIN (GLUCOPHAGE) 500 MG tablet; Take 1 tablet (500 mg total) by mouth 2 (two) times daily with a meal. -  tirzepatide [facility]) 15 MG/0.5ML Pen; Inject 15 mg into the skin once a week.  Primary insomnia Sleep hygiene discussed in detail. Continue below.  -     traZODone (DESYREL) 50 MG tablet; Take 1 tablet (50 mg total) by mouth at bedtime.  Depression, recurrent (HCC) Doing well on below, will continue.  -     venlafaxine XR (EFFEXOR-XR) 150 MG 24 hr capsule; Take 1 capsule (150 mg total) by mouth daily with breakfast. -     venlafaxine XR (EFFEXOR-XR) 75 MG 24 hr capsule; Take 1 capsule (75 mg total) by mouth daily with breakfast.  Right hip pain Imaging with degenerative changes, no acute findings. Can continue tylenol and motrin as needed for pain. Report new, worsening, or persistent symptoms.  -     DG HIP UNILAT W OR W/O PELVIS 2-3 VIEWS RIGHT  Hyperlipidemia associated with type 2 diabetes mellitus (HCC) Diet encouraged - increase intake of fresh fruits and vegetables, increase intake of lean proteins. Bake, broil, or grill foods. Avoid fried, greasy, and fatty foods. Avoid fast foods. Increase intake of fiber-rich whole grains. Exercise encouraged - at least 150 minutes per week and advance as tolerated.  Goal BMI < 25. Continue medications as prescribed. Follow up in 3-6 months as discussed.  -     CMP14+EGFR  Hypertension associated with type  2 diabetes mellitus (HCC) BP well controlled. Changes were not made in regimen today. Goal BP is 130/80. Pt aware to report any persistent high or low readings. DASH diet and exercise encouraged. Exercise at least 150 minutes per week and increase as tolerated. Goal BMI > 25. Stress management encouraged. Avoid nicotine and tobacco product use. Avoid excessive alcohol and NSAID's. Avoid more than 2000 mg of sodium daily. Medications as prescribed. Follow up as scheduled.  -     CMP14+EGFR  Constipation Noted on imaging. Miralax clean out discussed in detail. Report new, worsening, or persistent symptoms.    Continue all other maintenance medications.  Follow up plan: Return in about 3 months (around 02/23/2023) for DM.   Continue healthy lifestyle choices, including diet (rich in fruits, vegetables, and lean proteins, and low in salt and simple carbohydrates) and exercise (at least 30 minutes of moderate physical activity daily).  Educational handout given for DM  The above assessment and management plan was discussed with the patient. The patient verbalized understanding of and has agreed to the management plan. Patient is aware to call the clinic if they develop any new symptoms or if symptoms persist or worsen. Patient is aware when to return to the clinic for a follow-up visit. Patient educated on when it is appropriate to go to the emergency department.   Kari Baars, FNP-C [facility] [phone]

## 2022-11-23 NOTE — Patient Instructions (Signed)

## 2022-11-24 LAB — CMP14+EGFR
ALT: 22 IU/L (ref 0–32)
AST: 22 IU/L (ref 0–40)
Albumin: 4.6 g/dL (ref 3.8–4.9)
Alkaline Phosphatase: 67 IU/L (ref 44–121)
BUN/Creatinine Ratio: 12 (ref 9–23)
BUN: 10 mg/dL (ref 6–24)
Bilirubin Total: 0.3 mg/dL (ref 0.0–1.2)
CO2: 23 mmol/L (ref 20–29)
Calcium: 9.7 mg/dL (ref 8.7–10.2)
Chloride: 100 mmol/L (ref 96–106)
Creatinine, Ser: 0.83 mg/dL (ref 0.57–1.00)
Globulin, Total: 2.3 g/dL (ref 1.5–4.5)
Glucose: 117 mg/dL — ABNORMAL HIGH (ref 70–99)
Potassium: 4.4 mmol/L (ref 3.5–5.2)
Sodium: 139 mmol/L (ref 134–144)
Total Protein: 6.9 g/dL (ref 6.0–8.5)
eGFR: 83 mL/min/{1.73_m2} (ref >59)

## 2022-11-24 LAB — VITAMIN B12: Vitamin B-12: 655 pg/mL (ref 232–1245)

## 2022-11-28 ENCOUNTER — Other Ambulatory Visit (HOSPITAL_COMMUNITY): Payer: Self-pay

## 2022-11-28 NOTE — Telephone Encounter (Signed)
Test claims show patient has coverage through [employer] which is covering medication for $25.00 through [facility]

## 2022-11-30 NOTE — Telephone Encounter (Signed)
Left message asking pt to return call and let us know what [facility] pharmacy she would like for [place] to send Mounjaro to.

## 2022-12-03 ENCOUNTER — Ambulatory Visit: Payer: Managed Care, Other (non HMO)

## 2022-12-03 DIAGNOSIS — E1165 Type 2 diabetes mellitus with hyperglycemia: Secondary | ICD-10-CM | POA: Diagnosis not present

## 2022-12-03 LAB — HM DIABETES EYE EXAM

## 2022-12-03 NOTE — Progress Notes (Signed)
Kaylee Bryant arrived 12/03/2022 and has given verbal consent to obtain images and complete their overdue diabetic retinal screening.  The images have been sent to an ophthalmologist or optometrist for review and interpretation.  Results will be sent back to Sonny Masters, FNP for review.  Patient has been informed they will be contacted when we receive the results via telephone or MyChart

## 2022-12-07 NOTE — Progress Notes (Signed)
I have reviewed and agree with the above documentation.   Kari Baars, FNP-C [facility] [address] [place], [place] [address] [phone] 12/07/22

## 2022-12-11 ENCOUNTER — Encounter: Payer: Self-pay | Admitting: Family Medicine

## 2022-12-11 ENCOUNTER — Ambulatory Visit (INDEPENDENT_AMBULATORY_CARE_PROVIDER_SITE_OTHER): Payer: Managed Care, Other (non HMO) | Admitting: Family Medicine

## 2022-12-11 VITALS — BP 119/72 | HR 86 | Ht 63.0 in | Wt 173.8 lb

## 2022-12-11 DIAGNOSIS — G35 Multiple sclerosis: Secondary | ICD-10-CM | POA: Diagnosis not present

## 2022-12-11 DIAGNOSIS — R269 Unspecified abnormalities of gait and mobility: Secondary | ICD-10-CM

## 2022-12-11 DIAGNOSIS — R5382 Chronic fatigue, unspecified: Secondary | ICD-10-CM | POA: Diagnosis not present

## 2022-12-11 DIAGNOSIS — M5416 Radiculopathy, lumbar region: Secondary | ICD-10-CM

## 2022-12-11 DIAGNOSIS — G4700 Insomnia, unspecified: Secondary | ICD-10-CM

## 2022-12-11 DIAGNOSIS — M48061 Spinal stenosis, lumbar region without neurogenic claudication: Secondary | ICD-10-CM

## 2022-12-11 DIAGNOSIS — R0683 Snoring: Secondary | ICD-10-CM

## 2022-12-11 DIAGNOSIS — E559 Vitamin D deficiency, unspecified: Secondary | ICD-10-CM

## 2022-12-11 MED ORDER — PREDNISONE 10 MG (21) PO TBPK
ORAL_TABLET | ORAL | 0 refills | Status: DC
Start: 2022-12-11 — End: 2023-03-11

## 2022-12-11 NOTE — Patient Instructions (Signed)
Below is our plan:  We will continue Ocrevus, baclofen, cyclobenzaprine and gabapentin as prescribed. I will start prednisone taper. Please monitor CBGs closely. Stop prednisone if CBGs are above 300 and call endocrinology if symptoms persist. I will place a referral to neurosurgery and sleep med. Listen for calls to schedule both appts. Let me know if you change you mind regarding updating imaging.   Please make sure you are staying well hydrated. I recommend 50-60 ounces daily. Well balanced diet and regular exercise encouraged. Consistent sleep schedule with 6-8 hours recommended.   Please continue follow up with care team as directed.   Follow up with Dr Terrace Arabia in 3-4 months   You may receive a survey regarding today's visit. I encourage you to leave honest feed back as I do use this information to improve patient care. Thank you for seeing me today!

## 2022-12-11 NOTE — Progress Notes (Signed)
Chief Complaint  Patient presents with   Follow-up    Rm 2, Alone.  Increased muscle spasms R leg,  since last 2 wks to month. (All R side).  R sciatica pain when sitting for extended time.  Ocrevus- Intrafusion last infused 06/2022.     HISTORY OF PRESENT ILLNESS:  12/11/22 ALL:  Kaylee Bryant is a 57 y.o. female here today for follow up for  RRMS. She was last seen by Kaylee Kaylee Bryant 01/2022. She continues Ocrevuc infusions. Last infusion in April. MRI brain 07/2021 stable.   She reports doing okay from an MS standpoint. No new or exacerbating symptoms. No significant changes in gait. She does not use an assistive device.   She does report worsening radicular pain of right lower ext over the past month. MRI showed severe stenosis in 2022. Surgery advised but she was hesitant. She is requesting a referral to discuss possible pain management options. She continues baclofen 20mg  TID, cyclobenzaprine 10mg  at bedtime and using a TENS unit and OTC muscle rubs. She is taking ibuprofen 800mg  2-3 times and Tylenol 1000mg  1-2 times daily. She has taken hydrocodone from a dental procedure that seemed to help. She is also taking gabapentin 600mg  TID for spasticity. She wears compression stockings at work. No changes in gait. She walks about 60454 steps per day. She does not use an assistive device.   She reports that she continues to wake multiple times at night. Usually has to urinate. She does snore. She wakes feeling tired and not refreshed. She does endorse dry mouth upon waking. She was referred to sleep med but has not scheduled. She feels mood is good. She is taking venlafaxine 225mg  daily (PCP). She has a good support system with husband. She reports that she does not eat during the day. She usually eats dinner after getting home from work. She is a Charity fundraiser at [employer]. She drinks about 60 ounces of water daily.    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, low back pain has improved, but still have intermittent radiating pain to left lower extremity   She complains of fatigue, lack of stamina, do have excessive sleepiness, loud snoring,   A1c is 6.7, vitamin D 39.6, IgM was 9, normal IgG 780, CPK 194, negative HIV, hepatitis C,   Personally reviewed MRI of lumbar spine in March 2022, severe spinal stenosis L4-5 level due to pseudo disc bulging, facet hypertrophy right posterior lateral bone spurring,   MRI of the brain with without contrast in May 2023, stable MS lesions involving periventricular, subcortical, no contrast-enhancement  HISTORY (copied from Kaylee Bryant previous note)  She was diagnosed with RRMS in 2007, 6 month post partum, she had numbness from waist done, diagnosis was confirmed by marked abnormal MRI brain, in March,2007. There were multiple T2/Flair periventricular lesions, corpus callosum involvement, mutiple ring enhancing lesions. Also had enhancing lesion in cervical and thoracic spines. She was treated at [facility] by Kaylee. Tinnie Bryant, no csf study, but had extensive lab evaluations to rule out mimics. 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 She denies visual changes or other clear relapsing episodes.    MRI brain  in 01/2007, multiple T2/FLAIR lesions in  both hemisphere with corpus callosum involvement, there are slight increase in lesion load. MRI cervical, stable lesions in C6-7, and C2-3. Thoracic, multiple non enhancing lesions.    She tolerated Rebif well, no flu like illness, but complains of bilateral arm, and the lateral thigh lipoatrophy with repeat Rebif injection. There was no clear relapsed over the years, but she has slow worsening gait difficulty over the past 6 months.   MRI in Sept 2012, MRI of thoracic spine Multiple chronic plaques at C7, T6-7, T8-9, T10.   MRI of cervical spine ,Multiple chronic demyelinating plaques at C3, C4 and C7. MRI of brain Multiple round and ovioid, periventricular and subcortical chronic demyelinating plaques. 2 plaques noted in the pons. No abnormal lesions are seen on post contrast views.  Her JC-virus antibody was negative. After discuss, we started her on Tysarbri infusion since February of 2013. She tolerated the infusion very well, but complains of excessive fatigue over the past few months, she still able to work full-time as a Engineer, civil (consulting), she complains of difficulty dragging herself out of bed. Modafinil was not approved by insurance.She was put on Ritalin 10mg  tid, which helped her fatigue. feels much better on Tysabri.   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, with short staffed, she has to work extra hours, does complains of fatigue, on polypharmacy treatment, including Effexor XR 75 mg daily, baclofen 10 mg 2 tablets 3 times a day, tizanidine 2 mg 3 times a day, also on gabapentin 600 mg 3 times a day   Recently she has intermittent sharp radiating pain at right face from the right forehead to break in the eye corner, to below cheek area, gabapentin provide limited help,   Last MRI of the brain was in November 2020, personally reviewed the films, MRI of the brain multiple round and oval periventricular, subcortical, pontine chronic demyelinating plaques, no acute abnormality, no change from MRI compared with January 2019, MRI of cervical and thoracic spine showed chronic demyelinating plaque at C2, C3, 4, and C6- C7, T6-7, T9, T10, T12 and  L1, no enhancement   REVIEW OF SYSTEMS: Out of a complete 14 system review of symptoms, the patient complains only of the following symptoms, right lower back and lower ext pain, imbalance, difficulty sleeping and all other reviewed systems are negative.   ALLERGIES: No Known Allergies   HOME MEDICATIONS: Outpatient Medications Prior to Visit  Medication Sig Dispense Refill   acetaminophen (TYLENOL) 500 MG tablet Take 1,000 mg by mouth every 6 (six) hours as needed for mild pain.     aspirin EC 81 MG tablet Take 81 mg by mouth daily.     atorvastatin (LIPITOR) 80 MG tablet TAKE 1 TABLET BY MOUTH DAILY AT 6 PM. 90 tablet 3   baclofen (LIORESAL) 10 MG tablet TAKE 2 TABLETS BY MOUTH 3 TIMES A DAY 180 tablet 11   Continuous Blood Gluc Receiver (FREESTYLE LIBRE 2 READER) DEVI 1 Device by Does not apply route 4 (four) times daily. 1 each 0   Continuous Blood Gluc Sensor (FREESTYLE LIBRE 2 SENSOR) MISC USE TO CHECK SUGAR 4 (FOUR) TIMES DAILY. DX E11.65 6 each 3   cyclobenzaprine (FLEXERIL) 5 MG tablet Take 1 tablet (5  mg total) by mouth 2 (two) times daily. 180 tablet 3   gabapentin (NEURONTIN) 300 MG capsule Take 2 capsules (600 mg total) by mouth 3 (three) times daily. 540 capsule 3   lisinopril (ZESTRIL) 10 MG tablet Take 1 tablet (10 mg total) by mouth 2 (two) times daily. 60 tablet 11   metFORMIN (GLUCOPHAGE) 500 MG tablet Take 1 tablet (500 mg total) by mouth 2 (two) times daily with a meal. 180 tablet 1   metoprolol tartrate (LOPRESSOR) 25 MG tablet TAKE 1 TABLET BY MOUTH 2 TIMES DAILY. 180 tablet 3   ocrelizumab (OCREVUS) 300 MG/10ML injection Inject 20 mLs (600 mg total) into the vein every 6 (six) months. 20 mL 0   tirzepatide (MOUNJARO) 15 MG/0.5ML Pen Inject 15 mg into the skin once a week. 6 mL 3   traZODone (DESYREL) 50 MG tablet Take 1 tablet (50 mg total) by mouth at bedtime. 90 tablet 1   venlafaxine XR (EFFEXOR-XR) 150 MG 24 hr capsule Take 1 capsule (150 mg total) by mouth  daily with breakfast. 90 capsule 1   venlafaxine XR (EFFEXOR-XR) 75 MG 24 hr capsule Take 1 capsule (75 mg total) by mouth daily with breakfast. 90 capsule 1   No facility-administered medications prior to visit.     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   LIMA to LAD off pump     PAST SURGICAL HISTORY: Past Surgical History:  Procedure Laterality Date   CARDIAC CATHETERIZATION N/A 04/05/2015   Procedure: Left Heart Cath and Coronary Angiography;  Surgeon: Lennette Bihari, MD;  Location: [facility] INVASIVE CV LAB;  Service: Cardiovascular;  Laterality: N/A;   CERVICAL CONE BIOPSY  2004   CESAREAN SECTION     1996, 2006   CHOLECYSTECTOMY     1988   COLPOSCOPY  2004   CORONARY ARTERY BYPASS GRAFT N/A 04/07/2015   Procedure: CORONARY ARTERY BYPASS GRAFTING (CABG), OFF PUMP, TIMES ONE, USING LEFT INTERNAL MAMMARY ARTERY;  Surgeon: Purcell Nails, MD;  Location: [facility] OR;  Service: Open Heart Surgery;  Laterality: N/A;  LIMA to LAD   TEE WITHOUT CARDIOVERSION N/A 04/07/2015   Procedure: TRANSESOPHAGEAL ECHOCARDIOGRAM (TEE);  Surgeon: Purcell Nails, MD;  Location[facility] OR;  Service: Open Heart Surgery;  Laterality: N/A;     FAMILY HISTORY: Family History  Problem Relation Age of Onset   Diabetes Mother    Hypertension Mother    Hypertension Father    Atrial fibrillation Father    Diabetes Sister    Breast cancer Maternal Aunt    Arrhythmia Sister    Heart attack Maternal Grandfather    Stroke Neg Hx      SOCIAL HISTORY: Social History    Socioeconomic History   Marital status: Married    Spouse name: Not on file   Number of children: 4   Years of education: college   Highest education level: Not on file  Occupational History   Occupation: Charity fundraiser  Employer: [employer]  Tobacco Use   Smoking status: Former    Current packs/day: 0.00    Average packs/day: 1.5 packs/day for 15.0 years (22.5 ttl pk-yrs)    Types: Cigarettes    Start date: 02/02/1989    Quit date: 02/03/2004    Years since quitting: 18.8   Smokeless tobacco: Never  Substance and Sexual Activity   Alcohol use: Yes    Alcohol/week: 1.0 standard drink of alcohol    Types: 1 Glasses of wine per week   Drug use: No   Sexual activity: Yes    Partners: Male    Birth control/protection: Surgical    Comment: vasectomy  Other Topics Concern   Not on file  Social History Narrative   Patient lives at home with her husband and children. Patient works for the [employer] in [place]. As a Engineer, civil (consulting). College education.    Social Determinants of Health   Financial Resource Strain: Not on file  Food Insecurity: Not on file  Transportation Needs: Not on file  Physical Activity: Not on file  Stress: Not on file  Social Connections: Not on file  Intimate Partner Violence: Not on file     PHYSICAL EXAM  Vitals:   12/11/22 1314  BP: 119/72  Pulse: 86  Weight: 173 lb 12.8 oz (78.8 kg)  Height: 5\' 3"  (1.6 m)    Body mass index is 30.79 kg/m.  Generalized: Well developed, in no acute distress  Cardiology: normal rate and rhythm, no murmur auscultated  Respiratory: clear to auscultation bilaterally    Neurological examination  Mentation: Alert oriented to time, place, history taking. Follows all commands speech and language fluent Cranial nerve II-XII: Pupils were equal round reactive to light. Extraocular movements were full, visual field were full on confrontational test. Facial sensation and strength were normal. Head turning and  shoulder shrug  were normal and symmetric. Motor: The motor testing reveals 5 over 5 strength of all 4 extremities. Wit exception of 4+/5 right lower hip flexion. Good symmetric motor tone is noted throughout.  Sensory: Sensory testing is intact to soft touch on all 4 extremities. No evidence of extinction is noted.  Coordination: Cerebellar testing reveals good finger-nose-finger and heel-to-shin bilaterally.  Gait and station: Gait is normal.  Reflexes: Deep tendon reflexes are symmetric and normal bilaterally.    DIAGNOSTIC DATA (LABS, IMAGING, TESTING) - I reviewed patient records, labs, notes, testing and imaging myself where available.  Lab Results  Component Value Date   WBC 6.3 10/13/2021   HGB 12.7 10/13/2021   HCT 37.7 10/13/2021   MCV 91 10/13/2021   PLT 323 10/13/2021      Component Value Date/Time   NA 139 11/23/2022 1450   K 4.4 11/23/2022 1450   CL 100 11/23/2022 1450   CO2 23 11/23/2022 1450   GLUCOSE 117 (H) 11/23/2022 1450   GLUCOSE 216 (H) 08/17/2021 1120   BUN 10 11/23/2022 1450   CREATININE 0.83 11/23/2022 1450   CREATININE 0.65 06/02/2013 1640   CALCIUM 9.7 11/23/2022 1450   PROT 6.9 11/23/2022 1450   ALBUMIN 4.6 11/23/2022 1450   AST 22 11/23/2022 1450   ALT 22 11/23/2022 1450   ALKPHOS 67 11/23/2022 1450   BILITOT 0.3 11/23/2022 1450   GFRNONAA >60 08/15/2021 1635   GFRAA 100 05/02/2020 0809   Lab Results  Component Value Date   CHOL 114 10/13/2021   HDL 42 10/13/2021   LDLCALC 56 10/13/2021   TRIG 80  10/13/2021   CHOLHDL 2.7 10/13/2021   Lab Results  Component Value Date   HGBA1C 6.4 (H) 11/23/2022   Lab Results  Component Value Date   VITAMINB12 655 11/23/2022   Lab Results  Component Value Date   TSH 0.813 10/13/2021        No data to display               No data to display           ASSESSMENT AND PLAN  57 y.o. year old female  has a past medical history of Abnormal Pap smear (2004), Coronary artery disease  involving native coronary artery with unstable angina pectoris (HCC) (04/05/2015), DDD (degenerative disc disease), lumbar, DM (diabetes mellitus), gestational (2006), Fatigue, Heart attack (HCC), IBS (irritable bowel syndrome), Multiple sclerosis (HCC), Obesity, and S/P CABG x 1 (04/07/2015). here with    Relapsing remitting multiple sclerosis (HCC) - Plan: IgG, IgA, IgM, CBC with Differential/Platelets  Spinal stenosis of lumbar region, unspecified whether neurogenic claudication present - Plan: Ambulatory referral to Neurosurgery  Gait abnormality  Chronic fatigue - Plan: Ambulatory referral to Sleep Studies  Vitamin D deficiency - Plan: Vitamin D, 25-hydroxy  Lumbar radiculopathy - Plan: Ambulatory referral to Neurosurgery  Snoring - Plan: Ambulatory referral to Sleep Studies  Persistent disorder of initiating or maintaining sleep - Plan: Ambulatory referral to Sleep Studies  Insiya Oshea A Nobis contnues to tolerate Ocrevus. We will continue infusions every 6 months. I will update labs, today. We have discussed worsening radicular symptoms. We will start prednisone taper. She has tolerated well in the past. A1C well managed. She will stop and call endo if CBGs exceed 300. She will continue baclofen, cyclobenzaprine and gabapentin as prescribed. MRI brain and lumbar spine advised but she declines at this time. We will submit referral to NS for pain management versus surgical options if needed. We will also send her to sleep med for concerns of possible sleep apnea. Sleep hygiene advised. She was encouraged to eat regualr well balanced meals during the day. She will follow up with PCP as directed. She will return to Kaylee Kaylee Bryant in 3 months, sooner if needed. She verbalizes understanding and agreement with this plan.    Orders Placed This Encounter  Procedures   IgG, IgA, IgM   CBC with Differential/Platelets   Vitamin D, 25-hydroxy   Ambulatory referral to Neurosurgery    Referral Priority:   Routine     Referral Type:   Surgical    Referral Reason:   Specialty Services Required    Requested Specialty:   Neurosurgery    Number of Visits Requested:   1   Ambulatory referral to Sleep Studies    Referral Priority:   Routine    Referral Type:   Consultation    Referral Reason:   Specialty Services Required    Number of Visits Requested:   1     Meds ordered this encounter  Medications   predniSONE (STERAPRED UNI-PAK 21 TAB) 10 MG (21) TBPK tablet    Sig: Taper pack as directed    Dispense:  1 each    Refill:  0    Order Specific Question:   Supervising Provider    Answer:   Anson Fret [[id number]]     Shawnie Dapper, MSN, FNP-C 12/11/2022, 2:08 PM  [facility] [address] [place], [place] [address] [phone]

## 2022-12-12 ENCOUNTER — Telehealth: Payer: Self-pay | Admitting: Family Medicine

## 2022-12-12 NOTE — Telephone Encounter (Signed)
Referral for neurosurgery fax to [facility]. Phone: [phone], Fax: [phone]

## 2022-12-24 ENCOUNTER — Ambulatory Visit: Payer: Managed Care, Other (non HMO) | Admitting: Neurology

## 2023-01-04 ENCOUNTER — Other Ambulatory Visit: Payer: Self-pay | Admitting: Family Medicine

## 2023-01-04 DIAGNOSIS — E1165 Type 2 diabetes mellitus with hyperglycemia: Secondary | ICD-10-CM

## 2023-02-26 ENCOUNTER — Encounter: Payer: Self-pay | Admitting: Family Medicine

## 2023-02-26 ENCOUNTER — Ambulatory Visit: Payer: Managed Care, Other (non HMO) | Admitting: Family Medicine

## 2023-02-27 ENCOUNTER — Encounter: Payer: Self-pay | Admitting: *Deleted

## 2023-03-03 ENCOUNTER — Other Ambulatory Visit: Payer: Self-pay | Admitting: Neurology

## 2023-03-11 ENCOUNTER — Encounter: Payer: Self-pay | Admitting: Neurology

## 2023-03-11 ENCOUNTER — Other Ambulatory Visit: Payer: Self-pay | Admitting: Neurology

## 2023-03-11 ENCOUNTER — Ambulatory Visit (INDEPENDENT_AMBULATORY_CARE_PROVIDER_SITE_OTHER): Payer: Managed Care, Other (non HMO) | Admitting: Neurology

## 2023-03-11 VITALS — BP 154/84 | HR 77 | Resp 15 | Ht 63.0 in | Wt 164.5 lb

## 2023-03-11 DIAGNOSIS — M48061 Spinal stenosis, lumbar region without neurogenic claudication: Secondary | ICD-10-CM | POA: Diagnosis not present

## 2023-03-11 DIAGNOSIS — G35 Multiple sclerosis: Secondary | ICD-10-CM | POA: Diagnosis not present

## 2023-03-11 DIAGNOSIS — R269 Unspecified abnormalities of gait and mobility: Secondary | ICD-10-CM | POA: Diagnosis not present

## 2023-03-11 NOTE — Progress Notes (Signed)
Chief Complaint  Patient presents with   Multiple Sclerosis    Rm15, alone, ms spinal stenosis:pt stated bilateral thumbs lock up and bilateral hand dexterity issues   ASSESSMENT AND PLAN  Relapsing remitting multiple sclerosis Gait abnormality Lower extremity spasticity  Diagnosed since 2007, 6 months postpartum,  MRI of the brain was abnormal, also had evidence of cervical and thoracic spine enhancing lesions,  Ribif from March 2007-2013  Tysarbri from Feb 2013-2021  Start Ocrelizumab infusion since January 2021, switch treatment due to her slow worsening gait abnormality, she likes it better, with less frequent infusion, and seems to have better control of her MS symptoms as well, no flareup since switch to ocrelizumab, IgM was significantly decreased on repeat laboratory evaluation level was 10 in September 2024 with well-preserved IgG level, discussed with Dr. Epimenio Foot, we will extend her infusion every 57-month, (last infusion was on January 24, 2023, next due around September 24, 2023), laboratory evaluation include repeat IgG level, CD20 level prior to next infusion  Referred to physical therapy at [facility]  Lumbar stenosis, radiating pain to left lower extremity  MRI of lumbar spine in March 2022, L 4 5, pseudodisc bulging, facet hypertrophy, right posterior bone spurring resulting severe spinal stenosis,  She improved with conservative treatment, wear back bract     DIAGNOSTIC DATA (LABS, IMAGING, TESTING) - I reviewed patient records, labs, notes, testing and imaging myself where available. MRI of the brain with without contrast in May 2023: Foci of T2 hyperintensity in the periventricular greater than subcortical white matter in identified and consistent with history of multiple sclerosis. No definite new lesion. Minimal posterior fossa involvement is also unchanged. There is no abnormal enhancement. MRI thoracic spine (with and without) demonstrating: November 2020 - Chronic  demyelinating plaques at C7,T6-7, T9, T10, T12-L1. - No acute plaques.  MRI cervical spine (with and without) demonstrating: November 2020 - Chronic demyelinating plaques at C2, C3, C4, and C6-7 spinal cord; also noted in the brainstem.   JC virus titer was negative  HISTORY OF PRESENT ILLNESS: She was diagnosed with RRMS in 2007, 6 month post partum, she had numbness from waist done, diagnosis was confirmed by marked abnormal MRI brain, in March,2007. There were multiple T2/Flair periventricular lesions, corpus callosum involvement, mutiple ring enhancing lesions. Also had enhancing lesion in cervical and thoracic spines. She was treated at [facility] by Dr. Tinnie Gens, no csf study, but had extensive lab evaluations to rule out mimics. 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 She denies visual changes or other clear relapsing episodes.    MRI brain  in 01/2007, multiple T2/FLAIR lesions in both hemisphere with corpus callosum involvement, there are slight increase in lesion load. MRI cervical, stable lesions in C6-7, and C2-3. Thoracic, multiple non enhancing lesions.    She tolerated Rebif well, no flu like illness, but complains of bilateral arm, and the lateral thigh lipoatrophy with repeat Rebif injection. There was no clear relapsed over the years, but she has slow worsening gait difficulty over the past 6 months.  MRI in Sept 2012, MRI of thoracic spine Multiple chronic plaques at C7, T6-7, T8-9, T10.   MRI of cervical spine ,Multiple chronic demyelinating plaques at C3, C4 and C7. MRI of brain Multiple round and ovioid, periventricular and  subcortical chronic demyelinating plaques. 2 plaques noted in the pons. No abnormal lesions are seen on post contrast views.  Her  JC-virus antibody was negative. After discuss, we started her on Tysarbri infusion since February of 2013. She tolerated the infusion very well, but complains of excessive fatigue over the past few months, she still able to work full-time as a Engineer, civil (consulting), she complains of difficulty dragging herself out of bed. Modafinil was not approved by insurance.She was put on Ritalin 10mg  tid, which helped her fatigue. feels much better on Tysabri.   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, with short staffed, she has to work extra hours, does complains of fatigue, on polypharmacy treatment, including Effexor XR 75 mg daily, baclofen 10 mg 2 tablets 3 times a day, tizanidine 2 mg 3 times a day, also on gabapentin 600 mg 3 times a day   Recently she has intermittent sharp radiating pain at right face from the right forehead to break in the eye corner, to below cheek area, gabapentin provide limited help,   Last MRI of the brain was in November 2020, personally reviewed the films, MRI of the brain multiple round and oval periventricular, subcortical, pontine chronic demyelinating plaques, no acute abnormality, no change from MRI compared with January 2019, MRI of cervical and thoracic spine showed chronic demyelinating plaque at C2, C3, 4, and C6- C7, T6-7, T9, T10, T12 and L1, no enhancement  Update February 22, 2022: She is overall well, weight loss program, continue to work full-time as a Engineer, 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, low back pain has improved, but still have intermittent radiating pain to left lower extremity  She complains of fatigue, lack of stamina, do have excessive sleepiness, loud snoring,  A1c is 6.7, vitamin D 39.6, IgM  was 9, normal IgG 780, CPK 194, negative HIV, hepatitis C,  Personally reviewed MRI of lumbar spine in March 2022, severe spinal stenosis L4-5 level due to pseudo disc bulging, facet hypertrophy right posterior lateral bone spurring,  MRI of the brain with without contrast in May 2023, stable MS lesions involving periventricular, subcortical, no contrast-enhancement  UPDATE Dec 16th 2024: She is overall doing very well, no flareup of MS, tolerating ocrelizumab infusion, last infusion was January 24, 2023, next due in May 2025, continue to work as a Engineer, civil (consulting),  Laboratory in September 2024 normal vitamin D, B12, IgM was decreased  10, with well-preserved IgG 835    REVIEW OF SYSTEMS: Out of a complete 14 system review of symptoms, the patient complains only of the following symptoms, and all other reviewed systems are negative.  PHYSICAL EXAM: Today's Vitals   03/11/23 1116  BP: (!) 154/84  Pulse: 77  Resp: 15  Weight: 164 lb 8 oz (74.6 kg)  Height: 5\' 3"  (1.6 m)   Body mass index is 29.14 kg/m.   PHYSICAL EXAMNIATION:  Gen: NAD, conversant, well nourised, well groomed                     Cardiovascular: Regular rate rhythm, no peripheral edema, warm, nontender. Eyes: Conjunctivae clear without exudates or hemorrhage Neck: Supple, no carotid bruits. Pulmonary: Clear to auscultation bilaterally   NEUROLOGICAL EXAM:  MENTAL STATUS: Speech/cognition: Awake,  alert oriented to history taking and casual conversation  CRANIAL NERVES: CN II: Visual fields are full to confrontation.  Pupils are round equal and briskly reactive to light. CN III, IV, VI: extraocular movement are normal. No ptosis. CN V: Facial sensation is intact to pinprick in all 3 divisions bilaterally. Corneal responses are intact.  CN VII: Face is symmetric with normal eye closure and smile. CN VIII: Hearing is normal to casual conversation CN IX, X: Palate elevates symmetrically. Phonation is normal. CN XI: Head  turning and shoulder shrug are intact CN XII: Tongue is midline with normal movements and no atrophy.  MOTOR: There is no pronator drift of out-stretched arms. Muscle bulk and tone are normal. Muscle strength is normal.  REFLEXES: Reflexes are 2+ and symmetric at the biceps, triceps, knees, and ankles. Plantar responses are flexor.  SENSORY: Intact to light touch, pinprick, positional and vibratory sensation are intact in fingers and toes.  COORDINATION: Rapid alternating movements and fine finger movements are intact. There is no dysmetria on finger-to-nose and heel-knee-shin.    GAIT/STANCE: Posture is normal. Gait is steady with normal steps, base, arm swing, and turning. Heel and toe walking are normal. Tandem gait is normal.  Romberg is absent.   ALLERGIES: No Known Allergies   HOME MEDICATIONS: Outpatient Medications Prior to Visit  Medication Sig Dispense Refill   acetaminophen (TYLENOL) 500 MG tablet Take 1,000 mg by mouth every 6 (six) hours as needed for mild pain.     aspirin EC 81 MG tablet Take 81 mg by mouth daily.     atorvastatin (LIPITOR) 80 MG tablet TAKE 1 TABLET BY MOUTH DAILY AT 6 PM. 90 tablet 3   baclofen (LIORESAL) 10 MG tablet TAKE 2 TABLETS BY MOUTH 3 TIMES A DAY 180 tablet 11   Continuous Blood Gluc Receiver (FREESTYLE LIBRE 2 READER) DEVI 1 Device by Does not apply route 4 (four) times daily. 1 each 0   Continuous Glucose Sensor (FREESTYLE LIBRE 2 SENSOR) MISC USE TO CHECK SUGAR 4 (FOUR) TIMES DAILY. DX E11.65 2 each 11   cyclobenzaprine (FLEXERIL) 5 MG tablet TAKE 1 TABLET BY MOUTH TWICE A DAY 60 tablet 11   gabapentin (NEURONTIN) 300 MG capsule Take 2 capsules (600 mg total) by mouth 3 (three) times daily. 540 capsule 3   lisinopril (ZESTRIL) 10 MG tablet Take 1 tablet (10 mg total) by mouth 2 (two) times daily. 60 tablet 11   metFORMIN (GLUCOPHAGE) 500 MG tablet Take 1 tablet (500 mg total) by mouth 2 (two) times daily with a meal. 180 tablet 1    metoprolol tartrate (LOPRESSOR) 25 MG tablet TAKE 1 TABLET BY MOUTH 2 TIMES DAILY. 180 tablet 3   ocrelizumab (OCREVUS) 300 MG/10ML injection Inject 20 mLs (600 mg total) into the vein every 6 (six) months. 20 mL 0   tirzepatide (MOUNJARO) 15 MG/0.5ML Pen Inject 15 mg into the skin once a week. 6 mL 3   traZODone (DESYREL) 50 MG tablet Take 1 tablet (50 mg total) by mouth at bedtime. 90 tablet 1   venlafaxine XR (EFFEXOR-XR) 150 MG 24 hr capsule Take 1 capsule (150 mg total) by mouth daily with breakfast. 90 capsule 1   venlafaxine XR (EFFEXOR-XR) 75 MG 24 hr capsule Take 1 capsule (75 mg total) by mouth daily with breakfast. 90 capsule 1   predniSONE (STERAPRED UNI-PAK 21 TAB) 10 MG (21) TBPK tablet Taper pack as directed 1 each 0   No facility-administered medications prior to visit.  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   LIMA to LAD off pump     PAST SURGICAL HISTORY: Past Surgical History:  Procedure Laterality Date   CARDIAC CATHETERIZATION N/A 04/05/2015   Procedure: Left Heart Cath and Coronary Angiography;  Surgeon: Lennette Bihari, MD;  Location: [facility] INVASIVE CV LAB;  Service: Cardiovascular;  Laterality: N/A;   CERVICAL CONE BIOPSY  2004   CESAREAN SECTION     1996, 2006   CHOLECYSTECTOMY     1988   COLPOSCOPY  2004   CORONARY ARTERY BYPASS GRAFT N/A 04/07/2015   Procedure: CORONARY ARTERY BYPASS GRAFTING (CABG), OFF PUMP, TIMES ONE, USING LEFT INTERNAL MAMMARY ARTERY;  Surgeon: Purcell Nails, MD;  Location: [facility] OR;  Service: Open  Heart Surgery;  Laterality: N/A;  LIMA to LAD   TEE WITHOUT CARDIOVERSION N/A 04/07/2015   Procedure: TRANSESOPHAGEAL ECHOCARDIOGRAM (TEE);  Surgeon: Purcell Nails, MD;  Location: [facility] OR;  Service: Open Heart Surgery;  Laterality: N/A;     FAMILY HISTORY: Family History  Problem Relation Age of Onset   Diabetes Mother    Hypertension Mother    Hypertension Father    Atrial fibrillation Father    Diabetes Sister    Breast cancer Maternal Aunt    Arrhythmia Sister    Heart attack Maternal Grandfather    Stroke Neg Hx      SOCIAL HISTORY: Social History   Socioeconomic History   Marital status: Married    Spouse name: Not on file   Number of children: 4   Years of education: college   Highest education level: Not on file  Occupational History   Occupation: Teacher, adult education: [employer]  Tobacco Use   Smoking status: Former    Current packs/day: 0.00    Average packs/day: 1.5 packs/day for 15.0 years (22.5 ttl pk-yrs)    Types: Cigarettes    Start date: 02/02/1989    Quit date: 02/03/2004    Years since quitting: 19.1   Smokeless tobacco: Never  Substance and Sexual Activity   Alcohol use: Yes    Alcohol/week: 1.0 standard drink of alcohol    Types: 1 Glasses of wine per week   Drug use: No   Sexual activity: Yes    Partners: Male    Birth control/protection: Surgical    Comment: vasectomy  Other Topics Concern   Not on file  Social History Narrative   Patient lives at home with her husband and children. Patient works for the [employer] in [place]. As a Engineer, civil (consulting). College education.    Social Drivers of Corporate investment banker Strain: Not on file  Food Insecurity: Not on file  Transportation Needs: Not on file  Physical Activity: Not on file  Stress: Not on file  Social Connections: Not on file  Intimate Partner Violence: Not on file     Levert Feinstein, M.D. Ph.D.  [facility] [address] [place], [place]  [address] Phone:  [phone] Fax:      [phone]

## 2023-03-21 ENCOUNTER — Ambulatory Visit: Payer: Managed Care, Other (non HMO) | Admitting: Physical Therapy

## 2023-03-28 ENCOUNTER — Ambulatory Visit: Payer: Managed Care, Other (non HMO) | Admitting: Family Medicine

## 2023-03-28 ENCOUNTER — Encounter: Payer: Self-pay | Admitting: Family Medicine

## 2023-03-28 VITALS — BP 100/65 | HR 74 | Temp 97.3°F | Ht 63.0 in | Wt 163.8 lb

## 2023-03-28 DIAGNOSIS — M65311 Trigger thumb, right thumb: Secondary | ICD-10-CM

## 2023-03-28 DIAGNOSIS — Z7984 Long term (current) use of oral hypoglycemic drugs: Secondary | ICD-10-CM

## 2023-03-28 DIAGNOSIS — M65312 Trigger thumb, left thumb: Secondary | ICD-10-CM

## 2023-03-28 DIAGNOSIS — F5101 Primary insomnia: Secondary | ICD-10-CM

## 2023-03-28 DIAGNOSIS — E1169 Type 2 diabetes mellitus with other specified complication: Secondary | ICD-10-CM

## 2023-03-28 DIAGNOSIS — F339 Major depressive disorder, recurrent, unspecified: Secondary | ICD-10-CM | POA: Diagnosis not present

## 2023-03-28 DIAGNOSIS — Z23 Encounter for immunization: Secondary | ICD-10-CM

## 2023-03-28 LAB — BAYER DCA HB A1C WAIVED: HB A1C (BAYER DCA - WAIVED): 6.5 % — ABNORMAL HIGH (ref 4.8–5.6)

## 2023-03-28 MED ORDER — VENLAFAXINE HCL ER 75 MG PO CP24: 75.0000 mg | ORAL_CAPSULE | Freq: Every day | ORAL | 1 refills | Status: DC

## 2023-03-28 MED ORDER — PREDNISONE 20 MG PO TABS: 40.0000 mg | ORAL_TABLET | Freq: Every day | ORAL | 0 refills | Status: AC

## 2023-03-28 MED ORDER — VENLAFAXINE HCL ER 150 MG PO CP24
150.0000 mg | ORAL_CAPSULE | Freq: Every day | ORAL | 1 refills | Status: DC
Start: 2023-03-28 — End: 2023-10-02

## 2023-03-28 MED ORDER — TRAZODONE HCL 50 MG PO TABS
50.0000 mg | ORAL_TABLET | Freq: Every day | ORAL | 1 refills | Status: DC
Start: 2023-03-28 — End: 2023-09-16

## 2023-03-28 NOTE — Progress Notes (Signed)
 Subjective:  Patient ID: Kaylee Bryant, female    DOB: 1965/06/14, 58 y.o.   MRN: [id number]  Patient Care Team: Severa Rock HERO, FNP as PCP - General (Family Medicine)   Chief Complaint:  Diabetes (3 month follow up )   HPI: Kaylee Bryant is a 58 y.o. female presenting on 03/28/2023 for Diabetes (3 month follow up )   Discussed the use of AI scribe software for clinical note transcription with the patient, who gave verbal consent to proceed.  History of Present Illness   The patient, with a history of multiple sclerosis (MS) and diabetes, presents for management of chronic medical conditions. She has been using a protective sleeve to prevent sores due to the loss of sensation in her feet, a complication she attributes to her longstanding MS. She denies any recent MS flares.  The patient also reports a new issue of 'locking' thumbs, which has been ongoing for about two weeks. The condition is painful, particularly upon waking and during accidental hits. She has not sought any treatment for this condition yet.  Regarding her diabetes management, the patient reports good blood sugar control and denies any increased thirst, hunger, or urination. She has been on metformin  and Mounjaro , with a recent HbA1c of 6.5. She also reports occasional leg swelling after being up all day, which she manages with compression socks.  The patient is also on metoprolol  for heart/blood pressure management and atorvastatin  for cholesterol control, both of which she tolerates well. She has difficulty distinguishing between the fatigue caused by her MS and potential side effects of metoprolol . She is also on trazodone , which she reports is effective.        03/28/2023    3:25 PM 11/23/2022    2:49 PM 08/23/2022   10:55 AM 05/17/2022    9:10 AM 04/19/2022    9:13 AM  Depression screen PHQ 2/9  Decreased Interest 0 0 0 2 0  Down, Depressed, Hopeless 0 0 0 0 1  PHQ - 2 Score 0 0 0 2 1  Altered sleeping 3 2 3 3 3    Tired, decreased energy 3 0 2 3 2   Change in appetite 0 3 3 2 3   Feeling bad or failure about yourself  0 0 0 1 0  Trouble concentrating 2 0 2 2 1   Moving slowly or fidgety/restless 0 0 0 0 0  Suicidal thoughts 0 0 0 0 0  PHQ-9 Score 8 5 10 13 10   Difficult doing work/chores Somewhat difficult Not difficult at all Somewhat difficult  Somewhat difficult      03/28/2023    3:26 PM 11/23/2022    2:50 PM 08/23/2022   10:55 AM 04/19/2022    9:13 AM  GAD 7 : Generalized Anxiety Score  Nervous, Anxious, on Edge 0 0 0 0  Control/stop worrying 0 0 0 0  Worry too much - different things 0 0 0 1  Trouble relaxing 1 0 3 1  Restless 0 0 0 0  Easily annoyed or irritable 0 0 2 0  Afraid - awful might happen 0 0 0 0  Total GAD 7 Score 1 0 5 2  Anxiety Difficulty Somewhat difficult Not difficult at all Somewhat difficult Somewhat difficult         Relevant past medical, surgical, family, and social history reviewed and updated as indicated.  Allergies and medications reviewed and updated. Data reviewed: Chart in Epic.   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   LIMA to LAD off pump    Past Surgical History:  Procedure Laterality Date   CARDIAC CATHETERIZATION N/A 04/05/2015   Procedure: Left Heart Cath and Coronary Angiography;  Surgeon: Debby DELENA Sor, MD;  Location: [facility] INVASIVE CV LAB;  Service: Cardiovascular;  Laterality: N/A;   CERVICAL CONE BIOPSY  2004   CESAREAN SECTION     1996, 2006   CHOLECYSTECTOMY     1988   COLPOSCOPY  2004   CORONARY ARTERY BYPASS GRAFT  N/A 04/07/2015   Procedure: CORONARY ARTERY BYPASS GRAFTING (CABG), OFF PUMP, TIMES ONE, USING LEFT INTERNAL MAMMARY ARTERY;  Surgeon: Sudie VEAR Laine, MD;  Location: [facility] OR;  Service: Open Heart Surgery;  Laterality: N/A;  LIMA to LAD   TEE WITHOUT CARDIOVERSION N/A 04/07/2015   Procedure: TRANSESOPHAGEAL ECHOCARDIOGRAM (TEE);  Surgeon: Sudie VEAR Laine, MD;  Location: [facility] OR;  Service: Open Heart Surgery;  Laterality: N/A;    Social History   Socioeconomic History   Marital status: Married    Spouse name: Not on file   Number of children: 4   Years of education: college   Highest education level: Not on file  Occupational History   Occupation: Teacher, Adult Education: [employer]  Tobacco Use   Smoking status: Former    Current packs/day: 0.00    Average packs/day: 1.5 packs/day for 15.0 years (22.5 ttl pk-yrs)    Types: Cigarettes    Start date: 02/02/1989    Quit date: 02/03/2004    Years since quitting: 19.1   Smokeless tobacco: Never  Substance and Sexual Activity   Alcohol use: Yes    Alcohol/week: 1.0 standard drink of alcohol    Types: 1 Glasses of wine per week   Drug use: No   Sexual activity: Yes    Partners: Male    Birth control/protection: Surgical    Comment: vasectomy  Other Topics Concern   Not on file  Social History Narrative   Patient lives at home with her husband and children. Patient works for the [employer] in [place]. As a engineer, civil (consulting). College education.    Social Drivers of Corporate Investment Banker Strain: Not on file  Food Insecurity: Not on file  Transportation Needs: Not on file  Physical Activity: Not on file  Stress: Not on file  Social Connections: Not on file  Intimate Partner Violence: Not on file    Outpatient Encounter Medications as of 03/28/2023  Medication Sig   acetaminophen  (TYLENOL ) 500 MG tablet Take 1,000 mg by mouth every 6 (six) hours as needed for mild pain.   aspirin  EC 81 MG tablet Take 81 mg by mouth daily.    atorvastatin  (LIPITOR ) 80 MG tablet TAKE 1 TABLET BY MOUTH DAILY AT 6 PM.   baclofen  (LIORESAL ) 10 MG tablet TAKE 2 TABLETS BY MOUTH 3 TIMES A DAY   Continuous Blood Gluc Receiver (FREESTYLE LIBRE 2 READER) DEVI 1 Device by Does not apply route  4 (four) times daily.   Continuous Glucose Sensor (FREESTYLE LIBRE 2 SENSOR) MISC USE TO CHECK SUGAR 4 (FOUR) TIMES DAILY. DX E11.65   cyclobenzaprine  (FLEXERIL ) 5 MG tablet TAKE 1 TABLET BY MOUTH TWICE A DAY   gabapentin  (NEURONTIN ) 300 MG capsule TAKE 2 CAPSULES BY MOUTH 3 TIMES DAILY.   lisinopril  (ZESTRIL ) 10 MG tablet Take 1 tablet (10 mg total) by mouth 2 (two) times daily.   metoprolol  tartrate (LOPRESSOR ) 25 MG tablet TAKE 1 TABLET BY MOUTH 2 TIMES DAILY.   ocrelizumab  (OCREVUS ) 300 MG/10ML injection Inject 20 mLs (600 mg total) into the vein every 6 (six) months.   predniSONE  (DELTASONE ) 20 MG tablet Take 2 tablets (40 mg total) by mouth daily with breakfast for 5 days.   tirzepatide  (MOUNJARO ) 15 MG/0.5ML Pen Inject 15 mg into the skin once a week.   [DISCONTINUED] metFORMIN  (GLUCOPHAGE ) 500 MG tablet Take 1 tablet (500 mg total) by mouth 2 (two) times daily with a meal.   [DISCONTINUED] traZODone  (DESYREL ) 50 MG tablet Take 1 tablet (50 mg total) by mouth at bedtime.   [DISCONTINUED] venlafaxine  XR (EFFEXOR -XR) 150 MG 24 hr capsule Take 1 capsule (150 mg total) by mouth daily with breakfast.   [DISCONTINUED] venlafaxine  XR (EFFEXOR -XR) 75 MG 24 hr capsule Take 1 capsule (75 mg total) by mouth daily with breakfast.   traZODone  (DESYREL ) 50 MG tablet Take 1 tablet (50 mg total) by mouth at bedtime.   venlafaxine  XR (EFFEXOR -XR) 150 MG 24 hr capsule Take 1 capsule (150 mg total) by mouth daily with breakfast.   venlafaxine  XR (EFFEXOR -XR) 75 MG 24 hr capsule Take 1 capsule (75 mg total) by mouth daily with breakfast.   No facility-administered encounter medications on file as of 03/28/2023.    No Known Allergies  Pertinent ROS per HPI, otherwise  unremarkable      Objective:  BP 100/65   Pulse 74   Temp (!) 97.3 F (36.3 C)   Ht 5' 3 (1.6 m)   Wt 163 lb 12.8 oz (74.3 kg)   LMP 10/03/2014 Comment: last period was approx 6 months ago, i don't have it anymore  SpO2 97%   BMI 29.02 kg/m    Wt Readings from Last 3 Encounters:  03/28/23 163 lb 12.8 oz (74.3 kg)  03/11/23 164 lb 8 oz (74.6 kg)  12/11/22 173 lb 12.8 oz (78.8 kg)    Physical Exam Vitals and nursing note reviewed.  Constitutional:      Appearance: Normal appearance. She is well-developed, well-groomed and overweight.  HENT:     Head: Normocephalic and atraumatic.     Nose: Nose normal.     Mouth/Throat:     Mouth: Mucous membranes are moist.  Eyes:     Conjunctiva/sclera: Conjunctivae normal.     Pupils: Pupils are equal, round, and reactive to light.  Cardiovascular:     Rate and Rhythm: Normal rate and regular rhythm.     Pulses:          Dorsalis pedis pulses are 2+ on the right side and 2+ on the left side.       Posterior tibial pulses are 2+ on the right side and 2+ on the left side.     Heart sounds: Normal heart sounds.  Pulmonary:     Effort: Pulmonary effort is normal.     Breath sounds: Normal breath sounds.  Musculoskeletal:     Cervical back: Neck supple.     Right lower leg: No  edema.     Left lower leg: No edema.  Feet:     Right foot:     Protective Sensation: 10 sites tested.  10 sites sensed.     Skin integrity: Skin integrity normal.     Toenail Condition: Right toenails are normal.     Left foot:     Protective Sensation: 10 sites tested.  10 sites sensed.     Skin integrity: Skin integrity normal.     Toenail Condition: Left toenails are normal.  Skin:    General: Skin is warm and dry.     Capillary Refill: Capillary refill takes less than 2 seconds.  Neurological:     General: No focal deficit present.     Mental Status: She is alert and oriented to person, place, and time.     Gait: Gait abnormal (slow, antalgic).   Psychiatric:        Mood and Affect: Mood normal.        Behavior: Behavior normal. Behavior is cooperative.        Thought Content: Thought content normal.        Judgment: Judgment normal.      Results for orders placed or performed in visit on 12/11/22  IgG, IgA, IgM   Collection Time: 12/11/22  1:53 PM  Result Value Ref Range   IgG (Immunoglobin G), Serum 835 586 - 1,602 mg/dL   IgA/Immunoglobulin A, Serum 187 87 - 352 mg/dL   IgM (Immunoglobulin M), Srm 10 (L) 26 - 217 mg/dL  CBC with Differential/Platelets   Collection Time: 12/11/22  1:53 PM  Result Value Ref Range   WBC 14.5 (H) 3.4 - 10.8 x10E3/uL   RBC 4.78 3.77 - 5.28 x10E6/uL   Hemoglobin 14.3 11.1 - 15.9 g/dL   Hematocrit 57.0 65.9 - 46.6 %   MCV 90 79 - 97 fL   MCH 29.9 26.6 - 33.0 pg   MCHC 33.3 31.5 - 35.7 g/dL   RDW 87.6 88.2 - 84.5 %   Platelets 401 150 - 450 x10E3/uL   Neutrophils 76 Not Estab. %   Lymphs 16 Not Estab. %   Monocytes 5 Not Estab. %   Eos 2 Not Estab. %   Basos 1 Not Estab. %   Neutrophils Absolute 11.1 (H) 1.4 - 7.0 x10E3/uL   Lymphocytes Absolute 2.4 0.7 - 3.1 x10E3/uL   Monocytes Absolute 0.7 0.1 - 0.9 x10E3/uL   EOS (ABSOLUTE) 0.3 0.0 - 0.4 x10E3/uL   Basophils Absolute 0.1 0.0 - 0.2 x10E3/uL   Immature Granulocytes 0 Not Estab. %   Immature Grans (Abs) 0.0 0.0 - 0.1 x10E3/uL  Vitamin D , 25-hydroxy   Collection Time: 12/11/22  1:53 PM  Result Value Ref Range   Vit D, 25-Hydroxy 35.6 30.0 - 100.0 ng/mL       Pertinent labs & imaging results that were available during my care of the patient were reviewed by me and considered in my medical decision making.  Assessment & Plan:  Deania was seen today for diabetes.  Diagnoses and all orders for this visit:  Type 2 diabetes mellitus with other specified complication, without long-term current use of insulin  (HCC) -     Bayer DCA Hb A1c Waived -     Microalbumin / creatinine urine ratio  Primary insomnia -     traZODone  (DESYREL )  50 MG tablet; Take 1 tablet (50 mg total) by mouth at bedtime.  Depression, recurrent (HCC) -     venlafaxine  XR (  EFFEXOR -XR) 150 MG 24 hr capsule; Take 1 capsule (150 mg total) by mouth daily with breakfast. -     venlafaxine  XR (EFFEXOR -XR) 75 MG 24 hr capsule; Take 1 capsule (75 mg total) by mouth daily with breakfast.  Trigger finger of left thumb -     predniSONE  (DELTASONE ) 20 MG tablet; Take 2 tablets (40 mg total) by mouth daily with breakfast for 5 days.  Trigger finger of right thumb -     predniSONE  (DELTASONE ) 20 MG tablet; Take 2 tablets (40 mg total) by mouth daily with breakfast for 5 days.  Immunization due -     Flu vaccine trivalent PF, 6mos and older(Flulaval,Afluria,Fluarix,Fluzone)     Assessment and Plan    Trigger Finger Reports bilateral thumb locking for the past two weeks, causing significant pain, especially upon waking. Likely due to inflammation and tendon entrapment in the pulley system. Discussed risks of steroid burst, including temporary elevation of blood glucose levels. Benefits include reduced inflammation and pain relief. Alternatives include continued use of anti-inflammatories and splints without steroids. - Prescribe a 5-day burst of prednisone  - Recommend over-the-counter thumb spica wrist splints to be worn at night - Advise rest, ice, and anti-inflammatories - Monitor blood glucose levels - Follow up if symptoms persist or worsen  Type 2 Diabetes Mellitus Blood glucose levels are well-controlled with Mounjaro . Last A1c was 6.5%. Has peripheral neuropathy with reduced sensation in feet. Discussed discontinuing metformin  due to well-controlled A1c. Benefits of Mounjaro  include cardiac and potential autoimmune benefits. Risks of discontinuing metformin  include potential rise in A1c, to be monitored. - Discontinue metformin  - Continue Mounjaro  at 15 mg - Monitor A1c every three months - Advise on foot care to prevent sores due to  neuropathy  Hypertension Blood pressure is well-controlled with metoprolol . No significant side effects reported. Discussed difficulty distinguishing fatigue from medication versus MS. - Continue metoprolol  - Monitor blood pressure regularly  Hyperlipidemia On atorvastatin  with no reported side effects. - Continue atorvastatin  - Monitor lipid levels regularly  Multiple Sclerosis (MS) No recent flares reported. Follows up with neurologist every eight months and had a recent appointment in December. Current treatment includes Ocrevus . Discussed importance of staying active to prevent worsening of symptoms, as advised by neurologist. - Continue current MS management with neurologist - Monitor for any new symptoms or flares  General Health Maintenance Compliant with medications and follows up regularly with specialists. No new symptoms or significant changes in health status. - Ensure regular follow-ups with primary care and specialists - Maintain current lifestyle and medication regimen  Follow-up - Schedule follow-up appointment in three months to recheck A1c - Ensure all medication refills are [facility] - Follow up if there are any new or worsening symptoms.          Continue all other maintenance medications.  Follow up plan: Return in about 3 months (around 06/26/2023), or if symptoms worsen or fail to improve, for DM.   Continue healthy lifestyle choices, including diet (rich in fruits, vegetables, and lean proteins, and low in salt and simple carbohydrates) and exercise (at least 30 minutes of moderate physical activity daily).  Educational handout given for DM  The above assessment and management plan was discussed with the patient. The patient verbalized understanding of and has agreed to the management plan. Patient is aware to call the clinic if they develop any new symptoms or if symptoms persist or worsen. Patient is aware when to return to the clinic for a follow-up  visit. Patient educated on when it is appropriate to go to the emergency department.   Rosaline Bruns, FNP-C [facility] [phone]

## 2023-03-28 NOTE — Patient Instructions (Addendum)
 Thumb spica wrist splint  Continue to monitor your blood sugars as we discussed and record them. Bring the log to your next appointment.  Take your medications as directed.    Goal Blood glucose:    Fasting (before meals) = 80 to 130   Within 2 hours of eating = less than 180   Understanding your Hemoglobin A1c: 6.5     Diabetes Mellitus and Nutrition    I think that you would greatly benefit from seeing a nutritionist. If this is something you are interested in, please call Dr Wonda at [phone] to schedule an appointment.   When you have diabetes (diabetes mellitus), it is very important to have healthy eating habits because your blood sugar (glucose) levels are greatly affected by what you eat and drink. Eating healthy foods in the appropriate amounts, at about the same times every day, can help you: Control your blood glucose. Lower your risk of heart disease. Improve your blood pressure. Reach or maintain a healthy weight.  Every person with diabetes is different, and each person has different needs for a meal plan. Your health care provider may recommend that you work with a diet and nutrition specialist (dietitian) to make a meal plan that is best for you. Your meal plan may vary depending on factors such as: The calories you need. The medicines you take. Your weight. Your blood glucose, blood pressure, and cholesterol levels. Your activity level. Other health conditions you have, such as heart or kidney disease.  How do carbohydrates affect me? Carbohydrates affect your blood glucose level more than any other type of food. Eating carbohydrates naturally increases the amount of glucose in your blood. Carbohydrate counting is a method for keeping track of how many carbohydrates you eat. Counting carbohydrates is important to keep your blood glucose at a healthy level, especially if you use insulin  or take certain oral diabetes medicines. It is important to know how many  carbohydrates you can safely have in each meal. This is different for every person. Your dietitian can help you calculate how many carbohydrates you should have at each meal and for snack. Foods that contain carbohydrates include: Bread, cereal, rice, pasta, and crackers. Potatoes and corn. Peas, beans, and lentils. Milk and yogurt. Fruit and juice. Desserts, such as cakes, cookies, ice cream, and candy.  How does alcohol affect me? Alcohol can cause a sudden decrease in blood glucose (hypoglycemia), especially if you use insulin  or take certain oral diabetes medicines. Hypoglycemia can be a life-threatening condition. Symptoms of hypoglycemia (sleepiness, dizziness, and confusion) are similar to symptoms of having too much alcohol. If your health care provider says that alcohol is safe for you, follow these guidelines: Limit alcohol intake to no more than 1 drink per day for nonpregnant women and 2 drinks per day for men. One drink equals 12 oz of beer, 5 oz of wine, or 1 oz of hard liquor. Do not drink on an empty stomach. Keep yourself hydrated with water, diet soda, or unsweetened iced tea. Keep in mind that regular soda, juice, and other mixers may contain a lot of sugar and must be counted as carbohydrates.  What are tips for following this plan?  Reading food labels Start by checking the serving size on the label. The amount of calories, carbohydrates, fats, and other nutrients listed on the label are based on one serving of the food. Many foods contain more than one serving per package. Check the total grams (g) of carbohydrates in  one serving. You can calculate the number of servings of carbohydrates in one serving by dividing the total carbohydrates by 15. For example, if a food has 30 g of total carbohydrates, it would be equal to 2 servings of carbohydrates. Check the number of grams (g) of saturated and trans fats in one serving. Choose foods that have low or no amount of these  fats. Check the number of milligrams (mg) of sodium in one serving. Most people should limit total sodium intake to less than 2,300 mg per day. Always check the nutrition information of foods labeled as low-fat or nonfat. These foods may be higher in added sugar or refined carbohydrates and should be avoided. Talk to your dietitian to identify your daily goals for nutrients listed on the label.  Shopping Avoid buying canned, premade, or processed foods. These foods tend to be high in fat, sodium, and added sugar. Shop around the outside edge of the grocery store. This includes fresh fruits and vegetables, bulk grains, fresh meats, and fresh dairy.  Cooking Use low-heat cooking methods, such as baking, instead of high-heat cooking methods like deep frying. Cook using healthy oils, such as olive, canola, or sunflower oil. Avoid cooking with butter, cream, or high-fat meats.  Meal planning Eat meals and snacks regularly, preferably at the same times every day. Avoid going long periods of time without eating. Eat foods high in fiber, such as fresh fruits, vegetables, beans, and whole grains. Talk to your dietitian about how many servings of carbohydrates you can eat at each meal. Eat 4-6 ounces of lean protein each day, such as lean meat, chicken, fish, eggs, or tofu. 1 ounce is equal to 1 ounce of meat, chicken, or fish, 1 egg, or 1/4 cup of tofu. Eat some foods each day that contain healthy fats, such as avocado, nuts, seeds, and fish.  Lifestyle  Check your blood glucose regularly. Exercise at least 30 minutes 5 or more days each week, or as told by your health care provider. Take medicines as told by your health care provider. Do not use any products that contain nicotine or tobacco, such as cigarettes and e-cigarettes. If you need help quitting, ask your health care provider. Work with a veterinary surgeon or diabetes educator to identify strategies to manage stress and any emotional and social  challenges.  What are some questions to ask my health care provider? Do I need to meet with a diabetes educator? Do I need to meet with a dietitian? What number can I call if I have questions? When are the best times to check my blood glucose?  Where to find more information: American Diabetes Association: diabetes.org/food-and-fitness/food Academy of Nutrition and Dietetics: https://www.vargas.com/ [employer] of Diabetes and Digestive and Kidney Diseases (NIH): findjewelers.cz  Summary A healthy meal plan will help you control your blood glucose and maintain a healthy lifestyle. Working with a diet and nutrition specialist (dietitian) can help you make a meal plan that is best for you. Keep in mind that carbohydrates and alcohol have immediate effects on your blood glucose levels. It is important to count carbohydrates and to use alcohol carefully. This information is not intended to replace advice given to you by your health care provider. Make sure you discuss any questions you have with your health care provider. Document Released: 12/07/2004 Document Revised: 04/16/2016 Document Reviewed: 04/16/2016 Elsevier Interactive Patient Education  [employer].

## 2023-03-29 LAB — MICROALBUMIN / CREATININE URINE RATIO
Creatinine, Urine: 121.1 mg/dL
Microalb/Creat Ratio: 5 mg/g{creat} (ref 0–29)
Microalbumin, Urine: 5.8 ug/mL

## 2023-04-28 ENCOUNTER — Other Ambulatory Visit: Payer: Self-pay | Admitting: Family Medicine

## 2023-04-28 DIAGNOSIS — E1169 Type 2 diabetes mellitus with other specified complication: Secondary | ICD-10-CM

## 2023-04-29 ENCOUNTER — Encounter: Payer: Self-pay | Admitting: Cardiovascular Disease

## 2023-04-29 ENCOUNTER — Ambulatory Visit: Payer: Managed Care, Other (non HMO) | Attending: Cardiovascular Disease | Admitting: Cardiovascular Disease

## 2023-04-29 VITALS — BP 120/70 | HR 73 | Ht 63.0 in | Wt 163.0 lb

## 2023-04-29 DIAGNOSIS — E1159 Type 2 diabetes mellitus with other circulatory complications: Secondary | ICD-10-CM

## 2023-04-29 DIAGNOSIS — E1169 Type 2 diabetes mellitus with other specified complication: Secondary | ICD-10-CM | POA: Diagnosis not present

## 2023-04-29 DIAGNOSIS — Z951 Presence of aortocoronary bypass graft: Secondary | ICD-10-CM

## 2023-04-29 DIAGNOSIS — I152 Hypertension secondary to endocrine disorders: Secondary | ICD-10-CM

## 2023-04-29 DIAGNOSIS — E785 Hyperlipidemia, unspecified: Secondary | ICD-10-CM

## 2023-04-29 NOTE — Assessment & Plan Note (Signed)
History of CAD status post non-STEMI 04/05/2015.  She underwent cardiac catheterization by Dr. Tresa Endo revealing a 95% ostial LAD in 2 days later underwent off-pump LIMA to her LAD by Dr. Cornelius Moras .  She has been asymptomatic since.

## 2023-04-29 NOTE — Progress Notes (Unsigned)
04/29/2023 Saramarie A Coxe   1966/03/20  [id number]  Primary Physician Sonny Masters, FNP Primary Cardiologist: Runell Gess MD Roseanne Reno  HPI:  Kaylee Bryant is a 58 y.o.   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 I last saw her in the office 05/04/2022. She has a history of multiple sclerosis the past as well as diabetes and remote tobacco abuse having quit in 2005. She had a non-STEMI on 04/05/15 with unstable angina. She is catheterized by Dr. Tresa Endo revealing a 95% ostial LAD lesion and 2 days later underwent off-pump LIMA placement by Dr. Cornelius Moras. She participated in cardiac rehabilitation briefly.   Since I saw her a year ago she continues to do well.  She denies chest pain or shortness of breath.  She walks for her job and it is minimally symptomatic.     Current Meds  Medication Sig   acetaminophen (TYLENOL) 500 MG tablet Take 1,000 mg by mouth every 6 (six) hours as needed for mild pain.   aspirin EC 81 MG tablet Take 81 mg by mouth daily.   atorvastatin (LIPITOR) 80 MG tablet TAKE 1 TABLET BY MOUTH DAILY AT 6 PM.   baclofen (LIORESAL) 10 MG tablet TAKE 2 TABLETS BY MOUTH 3 TIMES A DAY   Continuous Blood Gluc Receiver (FREESTYLE LIBRE 2 READER) DEVI 1 Device by Does not apply route 4 (four) times daily.   Continuous Glucose Sensor (FREESTYLE LIBRE 2 SENSOR) MISC USE TO CHECK SUGAR 4 (FOUR) TIMES DAILY. DX E11.65   cyclobenzaprine (FLEXERIL) 5 MG tablet TAKE 1 TABLET BY MOUTH TWICE A DAY   gabapentin (NEURONTIN) 300 MG capsule TAKE 2 CAPSULES BY MOUTH 3 TIMES DAILY.   lisinopril (ZESTRIL) 10 MG tablet Take 1 tablet (10 mg total) by mouth 2 (two) times daily.   metoprolol tartrate (LOPRESSOR) 25 MG tablet TAKE 1 TABLET BY MOUTH 2 TIMES DAILY.   ocrelizumab (OCREVUS) 300 MG/10ML injection Inject 20 mLs (600 mg total) into the vein every 6 (six) months.   tirzepatide (MOUNJARO) 15  MG/0.5ML Pen Inject 15 mg into the skin once a week.   traZODone (DESYREL) 50 MG tablet Take 1 tablet (50 mg total) by mouth at bedtime.   venlafaxine XR (EFFEXOR-XR) 150 MG 24 hr capsule Take 1 capsule (150 mg total) by mouth daily with breakfast.   venlafaxine XR (EFFEXOR-XR) 75 MG 24 hr capsule Take 1 capsule (75 mg total) by mouth daily with breakfast.     No Known Allergies  Social History   Socioeconomic History   Marital status: Married    Spouse name: Not on file   Number of children: 4   Years of education: college   Highest education level: Not on file  Occupational History   Occupation: Teacher, adult education: [employer]  Tobacco Use   Smoking status: Former    Current packs/day: 0.00    Average packs/day: 1.5 packs/day for 15.0 years (22.5 ttl pk-yrs)    Types: Cigarettes    Start date: 02/02/1989    Quit date: 02/03/2004    Years since quitting: 19.2   Smokeless tobacco: Never  Substance and Sexual Activity   Alcohol use: Yes    Alcohol/week: 1.0 standard drink of alcohol    Types: 1 Glasses of wine per week   Drug use: No   Sexual activity: Yes  Partners: Male    Birth control/protection: Surgical    Comment: vasectomy  Other Topics Concern   Not on file  Social History Narrative   Patient lives at home with her husband and children. Patient works for the [employer] in [place]. As a Engineer, civil (consulting). College education.    Social Drivers of Corporate investment banker Strain: Not on file  Food Insecurity: Not on file  Transportation Needs: Not on file  Physical Activity: Not on file  Stress: Not on file  Social Connections: Not on file  Intimate Partner Violence: Not on file     Review of Systems: General: negative for chills, fever, night sweats or weight changes.  Cardiovascular: negative for chest pain, dyspnea on exertion, edema, orthopnea, palpitations, paroxysmal nocturnal dyspnea or shortness of breath Dermatological: negative for  rash Respiratory: negative for cough or wheezing Urologic: negative for hematuria Abdominal: negative for nausea, vomiting, diarrhea, bright red blood per rectum, melena, or hematemesis Neurologic: negative for visual changes, syncope, or dizziness All other systems reviewed and are otherwise negative except as noted above.    Blood pressure 120/70, pulse 73, height 5\' 3"  (1.6 m), weight 163 lb (73.9 kg), last menstrual period 10/03/2014, SpO2 98%.  General appearance: alert and no distress Neck: no adenopathy, no carotid bruit, no JVD, supple, symmetrical, trachea midline, and thyroid not enlarged, symmetric, no tenderness/mass/nodules Lungs: clear to auscultation bilaterally Heart: regular rate and rhythm, S1, S2 normal, no murmur, click, rub or gallop Extremities: extremities normal, atraumatic, no cyanosis or edema Pulses: 2+ and symmetric Skin: Skin color, texture, turgor normal. No rashes or lesions Neurologic: Grossly normal  EKG EKG Interpretation Date/Time:  Monday April 29 2023 14:58:07 EST Ventricular Rate:  73 PR Interval:  162 QRS Duration:  82 QT Interval:  380 QTC Calculation: 418 R Axis:   25  Text Interpretation: Normal sinus rhythm Normal ECG When compared with ECG of 08-Apr-2015 06:47, No significant change was found Confirmed by Nanetta Batty [id number]) on 04/29/2023 3:02:19 PM    ASSESSMENT AND PLAN:   S/P CABG x 1 History of CAD status post non-STEMI 04/05/2015.  She underwent cardiac catheterization by Dr. Tresa Endo revealing a 95% ostial LAD in 2 days later underwent off-pump LIMA to her LAD by Dr. Cornelius Moras .  She has been asymptomatic since.  Hyperlipidemia associated with type 2 diabetes mellitus (HCC) History of hyperlipidemia on high-dose statin therapy with lipid profile performed 10/13/2021 by her PCP revealing total cholesterol of 114, LDL 56 and HDL 42.     Runell Gess MD FACP,FACC,FAHA, [facility] 04/29/2023 3:09 PM

## 2023-04-29 NOTE — Assessment & Plan Note (Signed)
History of hyperlipidemia on high-dose statin therapy with lipid profile performed 10/13/2021 by her PCP revealing total cholesterol of 114, LDL 56 and HDL 42.

## 2023-04-29 NOTE — Telephone Encounter (Signed)
MOUNJARO 15 MG/0.5ML Pen   Pharmacy comment: Alternative Requested:NOT COVERED.

## 2023-04-29 NOTE — Patient Instructions (Signed)
Medication Instructions:  Your physician recommends that you continue on your current medications as directed. Please refer to the Current Medication list given to you today.  *If you need a refill on your cardiac medications before your next appointment, please call your pharmacy*   Lab Work: Your physician recommends that you return for lab work in: the next week or 2 for FASTING lipid/liver panel  If you have labs (blood work) drawn today and your tests are completely normal, you will receive your results only by: MyChart Message (if you have MyChart) OR A paper copy in the mail If you have any lab test that is abnormal or we need to change your treatment, we will call you to review the results.   Follow-Up: At [facility], you and your health needs are our priority.  As part of our continuing mission to provide you with exceptional heart care, we have created designated Provider Care Teams.  These Care Teams include your primary Cardiologist (physician) and Advanced Practice Providers (APPs -  Physician Assistants and Nurse Practitioners) who all work together to provide you with the care you need, when you need it.  We recommend signing up for the patient portal called "MyChart".  Sign up information is provided on this After Visit Summary.  MyChart is used to connect with patients for Virtual Visits (Telemedicine).  Patients are able to view lab/test results, encounter notes, upcoming appointments, etc.  Non-urgent messages can be sent to your provider as well.   To learn more about what you can do with MyChart, go to [email or web address].    Your next appointment:   12 month(s)  Provider:   Nanetta Batty, MD     Other Instructions

## 2023-04-30 MED ORDER — MOUNJARO 15 MG/0.5ML ~~LOC~~ SOAJ: 15.0000 mg | SUBCUTANEOUS | 3 refills | Status: DC

## 2023-04-30 NOTE — Telephone Encounter (Signed)
Pt informed she is going to reach out to pharmacy and see if they can run the request thru her secondary insurance. LS

## 2023-04-30 NOTE — Telephone Encounter (Signed)
Refill failed. resent °

## 2023-04-30 NOTE — Addendum Note (Signed)
Addended by: Julious Payer D on: 04/30/2023 01:59 PM   Modules accepted: Orders

## 2023-05-10 ENCOUNTER — Other Ambulatory Visit: Payer: Self-pay | Admitting: Physical Medicine & Rehabilitation

## 2023-06-05 ENCOUNTER — Other Ambulatory Visit

## 2023-06-06 ENCOUNTER — Encounter: Payer: Self-pay | Admitting: *Deleted

## 2023-06-06 LAB — LIPID PANEL
Chol/HDL Ratio: 2.6 ratio (ref 0.0–4.4)
Cholesterol, Total: 156 mg/dL (ref 100–199)
HDL: 60 mg/dL (ref >39)
LDL Chol Calc (NIH): 78 mg/dL (ref 0–99)
Triglycerides: 100 mg/dL (ref 0–149)
VLDL Cholesterol Cal: 18 mg/dL (ref 5–40)

## 2023-06-06 LAB — HEPATIC FUNCTION PANEL
ALT: 21 IU/L (ref 0–32)
AST: 23 IU/L (ref 0–40)
Albumin: 4.2 g/dL (ref 3.8–4.9)
Alkaline Phosphatase: 63 IU/L (ref 44–121)
Bilirubin Total: 0.2 mg/dL (ref 0.0–1.2)
Bilirubin, Direct: 0.09 mg/dL (ref 0.00–0.40)
Total Protein: 6.5 g/dL (ref 6.0–8.5)

## 2023-06-09 ENCOUNTER — Other Ambulatory Visit: Payer: Self-pay | Admitting: Cardiovascular Disease

## 2023-06-09 ENCOUNTER — Other Ambulatory Visit: Payer: Self-pay | Admitting: Family Medicine

## 2023-06-09 DIAGNOSIS — E1169 Type 2 diabetes mellitus with other specified complication: Secondary | ICD-10-CM

## 2023-06-17 ENCOUNTER — Other Ambulatory Visit: Payer: Self-pay | Admitting: Family Medicine

## 2023-06-17 DIAGNOSIS — F5101 Primary insomnia: Secondary | ICD-10-CM

## 2023-06-19 ENCOUNTER — Telehealth: Payer: Self-pay | Admitting: Family Medicine

## 2023-06-20 NOTE — Telephone Encounter (Signed)
 Like pt assistance for [facility], this was before 05/23/22 when it was due.

## 2023-06-20 NOTE — Telephone Encounter (Signed)
 [place], [facility], CPhT  You1 hour ago (9:40 AM)   No assistance available for Mounjaro at the momen  Pt aware there is not pt assistance at this time. It is not patient assistance it is for a prior authorization.

## 2023-07-02 ENCOUNTER — Telehealth: Payer: Self-pay

## 2023-07-02 ENCOUNTER — Ambulatory Visit (INDEPENDENT_AMBULATORY_CARE_PROVIDER_SITE_OTHER): Payer: Managed Care, Other (non HMO) | Admitting: Family Medicine

## 2023-07-02 ENCOUNTER — Encounter: Payer: Self-pay | Admitting: Family Medicine

## 2023-07-02 VITALS — BP 148/81 | HR 59 | Temp 97.5°F | Ht 63.0 in | Wt 175.0 lb

## 2023-07-02 DIAGNOSIS — E1169 Type 2 diabetes mellitus with other specified complication: Secondary | ICD-10-CM | POA: Diagnosis not present

## 2023-07-02 DIAGNOSIS — F339 Major depressive disorder, recurrent, unspecified: Secondary | ICD-10-CM | POA: Diagnosis not present

## 2023-07-02 DIAGNOSIS — G35D Multiple sclerosis, unspecified: Secondary | ICD-10-CM

## 2023-07-02 DIAGNOSIS — I252 Old myocardial infarction: Secondary | ICD-10-CM

## 2023-07-02 DIAGNOSIS — G35 Multiple sclerosis: Secondary | ICD-10-CM

## 2023-07-02 DIAGNOSIS — I2510 Atherosclerotic heart disease of native coronary artery without angina pectoris: Secondary | ICD-10-CM | POA: Diagnosis not present

## 2023-07-02 DIAGNOSIS — I83813 Varicose veins of bilateral lower extremities with pain: Secondary | ICD-10-CM

## 2023-07-02 DIAGNOSIS — E785 Hyperlipidemia, unspecified: Secondary | ICD-10-CM

## 2023-07-02 DIAGNOSIS — E1159 Type 2 diabetes mellitus with other circulatory complications: Secondary | ICD-10-CM | POA: Diagnosis not present

## 2023-07-02 DIAGNOSIS — R269 Unspecified abnormalities of gait and mobility: Secondary | ICD-10-CM

## 2023-07-02 DIAGNOSIS — I152 Hypertension secondary to endocrine disorders: Secondary | ICD-10-CM

## 2023-07-02 DIAGNOSIS — Z7985 Long-term (current) use of injectable non-insulin antidiabetic drugs: Secondary | ICD-10-CM

## 2023-07-02 LAB — BAYER DCA HB A1C WAIVED: HB A1C (BAYER DCA - WAIVED): 7.6 % — ABNORMAL HIGH (ref 4.8–5.6)

## 2023-07-02 MED ORDER — TIRZEPATIDE 7.5 MG/0.5ML ~~LOC~~ SOAJ: 7.5000 mg | SUBCUTANEOUS | 3 refills | Status: DC

## 2023-07-02 NOTE — Telephone Encounter (Signed)
 Pharmacy Patient Advocate Encounter   Received notification from CoverMyMeds that prior authorization for Mounjaro 7.5MG /0.5ML auto-injectors is required/requested.   Insurance verification completed.   The patient is insured through [employer] .   Per test claim: PA required; PA submitted to above mentioned insurance via CoverMyMeds Key/confirmation #/EOC [facility] Status is pending

## 2023-07-02 NOTE — Patient Instructions (Addendum)

## 2023-07-02 NOTE — Progress Notes (Signed)
 Subjective:  Patient ID: Kaylee Bryant, female    DOB: 1965-12-07, 58 y.o.   MRN: [id number]  Patient Care Team: Sonny Masters, FNP as PCP - General (Family Medicine)   Chief Complaint:  Diabetes (3 month follow up )   HPI: Kaylee Bryant is a 58 y.o. female presenting on 07/02/2023 for Diabetes (3 month follow up )   Discussed the use of AI scribe software for clinical note transcription with the patient, who gave verbal consent to proceed.  History of Present Illness   Kaylee Bryant is a 58 year old female with diabetes who presents with difficulty obtaining Mounjaro medication.  She last received her Mounjaro medication at the end of February or the beginning of March and has been unable to obtain it since. She has been in contact with [facility], who informed her that the necessary information from the doctor's office has not been sent. She is concerned about her A1c levels increasing due to the lack of medication, as Greggory Keen was the only medication she was on for her diabetes. Mounjaro significantly helped her manage her food intake, stating that 'food was not an issue' while on the medication.  She has a history of diabetes, coronary artery disease, and hyperlipidemia. Her blood sugar levels have been running high since she has been without Mounjaro. No increased thirst, hunger, or urination. She is currently taking lisinopril for blood pressure management and reports no issues with this medication. Her A1c is noted to be 7.6.  She also has a history of multiple sclerosis and is currently on Ocrevus. She experiences balance issues but has found relief in back pain with new shoes, which have helped her significantly. She still has back pain but it is not as severe as before. She has considered back surgery in the past due to the severity of the pain.  She experiences heaviness and tiredness in her legs, particularly on one side, and notes swelling even with compression socks. She  recalls being advised to get thigh-high compression stockings before COVID but has not followed up.  In her social history, her mother-in-law recently passed away, and she, along with her husband and sister-in-law, are caring for her handicapped sister-in-law who is legally blind and has developmental disabilities. She is committed to keeping her sister-in-law out of a care home.         07/02/2023    3:33 PM 03/28/2023    3:26 PM 11/23/2022    2:50 PM 08/23/2022   10:55 AM  GAD 7 : Generalized Anxiety Score  Nervous, Anxious, on Edge 0 0 0 0  Control/stop worrying 0 0 0 0  Worry too much - different things 0 0 0 0  Trouble relaxing 0 1 0 3  Restless 0 0 0 0  Easily annoyed or irritable 0 0 0 2  Afraid - awful might happen 0 0 0 0  Total GAD 7 Score 0 1 0 5  Anxiety Difficulty Not difficult at all Somewhat difficult Not difficult at all Somewhat difficult       07/02/2023    3:33 PM 03/28/2023    3:25 PM 11/23/2022    2:49 PM 08/23/2022   10:55 AM 05/17/2022    9:10 AM  Depression screen PHQ 2/9  Decreased Interest 0 0 0 0 2  Down, Depressed, Hopeless 0 0 0 0 0  PHQ - 2 Score 0 0 0 0 2  Altered sleeping 0 3 2 3  3  Tired, decreased energy 0 3 0 2 3  Change in appetite 3 0 3 3 2   Feeling bad or failure about yourself  0 0 0 0 1  Trouble concentrating 0 2 0 2 2  Moving slowly or fidgety/restless 0 0 0 0 0  Suicidal thoughts 0 0 0 0 0  PHQ-9 Score 3 8 5 10 13   Difficult doing work/chores Not difficult at all Somewhat difficult Not difficult at all Somewhat difficult        Relevant past medical, surgical, family, and social history reviewed and updated as indicated.  Allergies and medications reviewed and updated. Data reviewed: Chart in Epic.   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   LIMA to LAD off pump    Past Surgical History:  Procedure Laterality Date   CARDIAC CATHETERIZATION N/A 04/05/2015   Procedure: Left Heart Cath and Coronary Angiography;  Surgeon: Lennette Bihari, MD;  Location: [facility] INVASIVE CV LAB;  Service: Cardiovascular;  Laterality: N/A;   CERVICAL CONE BIOPSY  2004   CESAREAN SECTION     1996, 2006   CHOLECYSTECTOMY     1988   COLPOSCOPY  2004   CORONARY ARTERY BYPASS GRAFT N/A 04/07/2015   Procedure: CORONARY ARTERY BYPASS GRAFTING (CABG), OFF PUMP, TIMES ONE, USING LEFT INTERNAL MAMMARY ARTERY;  Surgeon: Purcell Nails, MD;  Location: [facility] OR;  Service: Open Heart Surgery;  Laterality: N/A;  LIMA to LAD   TEE WITHOUT CARDIOVERSION N/A 04/07/2015   Procedure: TRANSESOPHAGEAL ECHOCARDIOGRAM (TEE);  Surgeon: Purcell Nails, MD;  Location: [facility] OR;  Service: Open Heart Surgery;  Laterality: N/A;    Social History   Socioeconomic History   Marital status: Married    Spouse name: Not on file   Number of children: 4   Years of education: college   Highest education level: Not on file  Occupational History   Occupation: Teacher, adult education: [employer]  Tobacco Use   Smoking status: Former    Current packs/day: 0.00    Average packs/day: 1.5 packs/day for 15.0 years (22.5 ttl pk-yrs)    Types: Cigarettes    Start date: 02/02/1989    Quit date: 02/03/2004    Years since quitting: 19.4   Smokeless tobacco: Never  Substance and Sexual Activity   Alcohol use: Yes    Alcohol/week: 1.0 standard drink of alcohol    Types: 1 Glasses of wine per week   Drug use: No   Sexual activity: Yes    Partners: Male    Birth control/protection: Surgical    Comment: vasectomy  Other Topics Concern   Not on file  Social History  Narrative   Patient lives at home with her husband and children. Patient works for the [employer] in [place]. As a Engineer, civil (consulting). College education.    Social Drivers of Corporate investment banker Strain: Not on file  Food Insecurity: Not on file  Transportation Needs: Not on file  Physical Activity: Not on file  Stress: Not on file  Social Connections: Not on file  Intimate Partner Violence: Not on file    Outpatient Encounter Medications as of 07/02/2023  Medication Sig   acetaminophen (TYLENOL) 500 MG tablet Take 1,000 mg by mouth every 6 (six) hours as needed for mild pain.   aspirin EC 81 MG tablet Take 81 mg by mouth daily.   atorvastatin (LIPITOR) 80 MG tablet TAKE 1 TABLET BY MOUTH DAILY AT 6 PM.   baclofen (LIORESAL) 10 MG tablet TAKE 2 TABLETS BY MOUTH 3 TIMES A DAY   Continuous Blood Gluc Receiver (FREESTYLE LIBRE 2 READER) DEVI 1 Device by Does not apply route 4 (four) times daily.   Continuous Glucose Sensor (FREESTYLE LIBRE 2 SENSOR) MISC USE TO CHECK SUGAR 4 (FOUR) TIMES DAILY. DX E11.65   cyclobenzaprine (FLEXERIL) 5 MG tablet TAKE 1 TABLET BY MOUTH TWICE A DAY   gabapentin (NEURONTIN) 300 MG capsule TAKE 2 CAPSULES BY MOUTH 3 TIMES DAILY.   lisinopril (ZESTRIL) 10 MG tablet Take 1 tablet (10 mg total) by mouth 2 (two) times daily.   metoprolol tartrate (LOPRESSOR) 25 MG tablet TAKE 1 TABLET BY MOUTH TWICE A DAY   ocrelizumab (OCREVUS) 300 MG/10ML injection Inject 20 mLs (600 mg total) into the vein every 6 (six) months.   tirzepatide (MOUNJARO) 7.5 MG/0.5ML Pen Inject 7.5 mg into the skin once a week.   traZODone (DESYREL) 50 MG tablet Take 1 tablet (50 mg total) by mouth at bedtime.   venlafaxine XR (EFFEXOR-XR) 150 MG 24 hr capsule Take 1 capsule (150 mg total) by mouth daily with breakfast.   venlafaxine XR (EFFEXOR-XR) 75 MG 24 hr capsule Take 1 capsule (75 mg total) by mouth daily with breakfast.   [DISCONTINUED] tirzepatide (MOUNJARO) 15 MG/0.5ML Pen Inject 15 mg into  the skin once a week. (Patient not taking: Reported on 07/02/2023)   No facility-administered encounter medications on file as of 07/02/2023.    No Known Allergies  Pertinent ROS per HPI, otherwise unremarkable      Objective:  BP (!) 148/81   Pulse (!) 59   Temp (!) 97.5 F (36.4 C)   Ht 5\' 3"  (1.6 m)   Wt 175 lb (79.4 kg)   LMP 10/03/2014 Comment: last period was approx 6 months ago, "i don't have it anymore"  SpO2 99%   BMI 31.00 kg/m    Wt Readings from Last 3 Encounters:  07/02/23 175 lb (79.4 kg)  04/29/23 163 lb (73.9 kg)  03/28/23 163 lb 12.8 oz (74.3 kg)    Physical Exam Vitals and nursing note reviewed.  Constitutional:      General: She is not in acute distress.    Appearance: Normal appearance. She is well-developed and well-groomed. She is not ill-appearing, toxic-appearing or diaphoretic.  HENT:     Head: Normocephalic and atraumatic.     Jaw: There is normal jaw occlusion.     Right Ear: Hearing normal.     Left Ear: Hearing normal.     Nose: Nose normal.     Mouth/Throat:     Lips: Pink.     Mouth: Mucous membranes are moist.     Pharynx: Oropharynx is clear. Uvula midline.  Eyes:     General: Lids are normal.     Extraocular Movements: Extraocular movements intact.     Conjunctiva/sclera: Conjunctivae normal.     Pupils: Pupils are equal, round, and reactive to light.  Neck:  Thyroid: No thyroid mass, thyromegaly or thyroid tenderness.     Vascular: No carotid bruit or JVD.     Trachea: Trachea and phonation normal.  Cardiovascular:     Rate and Rhythm: Normal rate and regular rhythm.     Chest Wall: PMI is not displaced.     Pulses: Normal pulses.     Heart sounds: Normal heart sounds. No murmur heard.    No friction rub. No gallop.  Pulmonary:     Effort: Pulmonary effort is normal. No respiratory distress.     Breath sounds: Normal breath sounds. No wheezing.  Abdominal:     General: Bowel sounds are normal. There is no distension or  abdominal bruit.     Palpations: Abdomen is soft. There is no hepatomegaly or splenomegaly.     Tenderness: There is no abdominal tenderness. There is no right CVA tenderness or left CVA tenderness.     Hernia: No hernia is present.  Musculoskeletal:        General: Normal range of motion.     Cervical back: Normal range of motion and neck supple.     Right lower leg: No edema.     Left lower leg: No edema.  Lymphadenopathy:     Cervical: No cervical adenopathy.  Skin:    General: Skin is warm and dry.     Capillary Refill: Capillary refill takes less than 2 seconds.     Coloration: Skin is not cyanotic, jaundiced or pale.     Findings: No rash.  Neurological:     General: No focal deficit present.     Mental Status: She is alert and oriented to person, place, and time.     Sensory: Sensation is intact.     Motor: Motor function is intact.     Coordination: Coordination is intact.     Gait: Gait abnormal (slow, antalgic).     Deep Tendon Reflexes: Reflexes are normal and symmetric.  Psychiatric:        Attention and Perception: Attention and perception normal.        Mood and Affect: Mood and affect normal.        Speech: Speech normal.        Behavior: Behavior normal. Behavior is cooperative.        Thought Content: Thought content normal.        Cognition and Memory: Cognition and memory normal.        Judgment: Judgment normal.      Results for orders placed or performed in visit on 04/29/23  Lipid panel   Collection Time: 06/05/23  9:12 AM  Result Value Ref Range   Cholesterol, Total 156 100 - 199 mg/dL   Triglycerides 161 0 - 149 mg/dL   HDL 60 >09 mg/dL   VLDL Cholesterol Cal 18 5 - 40 mg/dL   LDL Chol Calc (NIH) 78 0 - 99 mg/dL   Chol/HDL Ratio 2.6 0.0 - 4.4 ratio  Hepatic function panel   Collection Time: 06/05/23  9:12 AM  Result Value Ref Range   Total Protein 6.5 6.0 - 8.5 g/dL   Albumin 4.2 3.8 - 4.9 g/dL   Bilirubin Total 0.2 0.0 - 1.2 mg/dL    Bilirubin, Direct 6.04 0.00 - 0.40 mg/dL   Alkaline Phosphatase 63 44 - 121 IU/L   AST 23 0 - 40 IU/L   ALT 21 0 - 32 IU/L       Pertinent labs & imaging results that were  available during my care of the patient were reviewed by me and considered in my medical decision making.  Assessment & Plan:  Kaylee Bryant was seen today for diabetes.  Diagnoses and all orders for this visit:  Type 2 diabetes mellitus with other specified complication, without long-term current use of insulin (HCC) -     Bayer DCA Hb A1c Waived -     tirzepatide (MOUNJARO) 7.5 MG/0.5ML Pen; Inject 7.5 mg into the skin once a week.  Hypertension associated with type 2 diabetes mellitus (HCC) -     Bayer DCA Hb A1c Waived -     tirzepatide (MOUNJARO) 7.5 MG/0.5ML Pen; Inject 7.5 mg into the skin once a week.  Hyperlipidemia associated with type 2 diabetes mellitus (HCC) -     Bayer DCA Hb A1c Waived -     tirzepatide (MOUNJARO) 7.5 MG/0.5ML Pen; Inject 7.5 mg into the skin once a week.  Coronary artery disease involving native coronary artery of native heart without angina pectoris -     Bayer DCA Hb A1c Waived -     tirzepatide (MOUNJARO) 7.5 MG/0.5ML Pen; Inject 7.5 mg into the skin once a week.  Depression, recurrent (HCC) -     Bayer DCA Hb A1c Waived  Multiple sclerosis (HCC) -     Bayer DCA Hb A1c Waived  Gait abnormality -     Bayer DCA Hb A1c Waived  History of non-ST elevation myocardial infarction (NSTEMI) -     Bayer DCA Hb A1c Waived -     tirzepatide (MOUNJARO) 7.5 MG/0.5ML Pen; Inject 7.5 mg into the skin once a week.  Varicose veins of both lower extremities with pain -     Ambulatory referral to Vascular Surgery     Assessment and Plan    Type 2 Diabetes Mellitus Inability to obtain [facility] since February has led to concerns about elevated A1c, currently at 7.6%. Blood glucose levels are elevated, but she reports no polyuria, polydipsia, or polyphagia. She is concerned about weight gain  affecting diabetes management. Mounjaro was effective in appetite control and diabetes management. - Resend Mounjaro prescription and prior authorization to pharmacy - Initiate Mounjaro at 7.5 mg to minimize nausea - Regularly monitor blood glucose levels  Coronary Artery Disease Coronary artery disease is well-managed with no angina. Annual cardiology visits and recent satisfactory EKG.  Hypertension Hypertension is well-controlled on lisinopril with no reported issues.  Hyperlipidemia Hyperlipidemia management was not discussed during this visit.  Multiple Sclerosis On Ocrevus for multiple sclerosis, reports balance issues. New shoes have alleviated some back pain related to balance.  Chronic Back Pain Chronic back pain improved with new footwear. Anticipates future surgery but currently managed with non-surgical interventions.  Varicose Veins Reports leg heaviness, tiredness, and swelling despite compression socks. No vascular follow-up since pre-COVID. - Refer to Dr. Jaynie Collins for evaluation and compression stocking fitting  General Health Maintenance Overdue for mammogram. Removed junk food from home to aid weight management. - Schedule mammogram          Continue all other maintenance medications.  Follow up plan: Return in about 3 months (around 10/01/2023), or if symptoms worsen or fail to improve, for DM.   Continue healthy lifestyle choices, including diet (rich in fruits, vegetables, and lean proteins, and low in salt and simple carbohydrates) and exercise (at least 30 minutes of moderate physical activity daily).  Educational handout given for DM  The above assessment and management plan was discussed with the patient. The  patient verbalized understanding of and has agreed to the management plan. Patient is aware to call the clinic if they develop any new symptoms or if symptoms persist or worsen. Patient is aware when to return to the clinic for a  follow-up visit. Patient educated on when it is appropriate to go to the emergency department.   Kari Baars, FNP-C [facility] [phone]

## 2023-07-03 ENCOUNTER — Other Ambulatory Visit (HOSPITAL_COMMUNITY): Payer: Self-pay

## 2023-07-03 ENCOUNTER — Encounter: Payer: Self-pay | Admitting: Family Medicine

## 2023-07-03 NOTE — Telephone Encounter (Signed)
 Pharmacy Patient Advocate Encounter  Received notification from [facility] that Prior Authorization for Mounjaro 7.5MG /0.5ML auto-injectors  has been APPROVED from 06/02/2023 to 07/01/2024. Unable to obtain price due to refill too soon rejection, last fill date 07/03/2023 next available fill date04/29/2025   PA #/Case ID/Reference #: [id number]

## 2023-07-03 NOTE — Telephone Encounter (Signed)
 PA has been approved  Patient needs to call pharmacy to refill

## 2023-07-05 NOTE — Telephone Encounter (Signed)
 Patient states her medication Greggory Keen) has not been approved  Can you follow up on this and also let the patient know? Below it says approved on 06/02/23 Thanks!

## 2023-07-05 NOTE — Telephone Encounter (Signed)
 Ahhh cancel that  She called again and was able to pick up I love when that happens Thanks!

## 2023-07-16 ENCOUNTER — Other Ambulatory Visit: Payer: Self-pay | Admitting: Neurology

## 2023-07-17 ENCOUNTER — Other Ambulatory Visit: Payer: Self-pay | Admitting: Cardiovascular Disease

## 2023-08-28 ENCOUNTER — Telehealth: Payer: Self-pay | Admitting: Neurology

## 2023-08-28 NOTE — Telephone Encounter (Signed)
 Please call patient, I ordered IgM level at her last visit on March 11, 2023, I did not see the report, do need the baseline before proceeding with next infusion  Gave her a follow-up visit with me for discussion

## 2023-08-28 NOTE — Telephone Encounter (Signed)
 Called and spoke to pt and stated to come do labs then we can schedule appt with dr. Gracie Lav and pt voiced understanding.

## 2023-09-02 NOTE — Telephone Encounter (Signed)
 When she comes in for lab, just add her to my schedule

## 2023-09-02 NOTE — Telephone Encounter (Signed)
 Called and scheduled for 09/04/23 at 4:15 to do labs and visit together as Dr. Gracie Lav asked

## 2023-09-04 ENCOUNTER — Ambulatory Visit (INDEPENDENT_AMBULATORY_CARE_PROVIDER_SITE_OTHER): Admitting: Neurology

## 2023-09-04 ENCOUNTER — Encounter: Payer: Self-pay | Admitting: Neurology

## 2023-09-04 VITALS — BP 112/58 | HR 81 | Resp 15 | Ht 63.0 in

## 2023-09-04 DIAGNOSIS — Z79899 Other long term (current) drug therapy: Secondary | ICD-10-CM | POA: Diagnosis not present

## 2023-09-04 DIAGNOSIS — G35 Multiple sclerosis: Secondary | ICD-10-CM | POA: Diagnosis not present

## 2023-09-04 DIAGNOSIS — G2581 Restless legs syndrome: Secondary | ICD-10-CM

## 2023-09-04 DIAGNOSIS — R269 Unspecified abnormalities of gait and mobility: Secondary | ICD-10-CM

## 2023-09-04 MED ORDER — ROPINIROLE HCL 0.5 MG PO TABS: 1.0000 mg | ORAL_TABLET | Freq: Three times a day (TID) | ORAL | 11 refills | Status: DC

## 2023-09-04 MED ORDER — GABAPENTIN 300 MG PO CAPS: 600.0000 mg | ORAL_CAPSULE | Freq: Four times a day (QID) | ORAL | 3 refills | Status: DC

## 2023-09-04 NOTE — Progress Notes (Signed)
 Chief Complaint  Patient presents with   Multiple Sclerosis    RM12, alone, MS:IN PAST 3 WEEKS having twitching or tremors widespread.    ASSESSMENT AND PLAN  Relapsing remitting multiple sclerosis Gait abnormality Lower extremity spasticity  Diagnosed since 2007, 6 months postpartum,  MRI of the brain was abnormal, also had evidence of cervical and thoracic spine enhancing lesions,  Ribif from March 2007-2013  Tysarbri from Feb 2013-2021  Start Ocrelizumab  infusion since January 2021, IgM was significantly decreased on repeat laboratory evaluation level was 10 in September 2024 with well-preserved IgG level, last infusion was on Jan 24 2024, planning on extended infusion every 8 months due to decreased IgM level, she does concern longer chair time, fatigue, especially before next infusion, agree switch to Briumvi  Referred to physical therapy at [facility]  Lumbar stenosis, radiating pain to left lower extremity  MRI of lumbar spine in March 2022, L 4 5, pseudodisc bulging, facet hypertrophy, right posterior bone spurring resulting severe spinal stenosis,  She improved with conservative treatment,   Restless leg syndrome  Higher dose of gabapentin  300 mg up to 4 times a day,  Add on Requip 0.5mg  2 tabs at bedtime.       DIAGNOSTIC DATA (LABS, IMAGING, TESTING) - I reviewed patient records, labs, notes, testing and imaging myself where available. MRI of the brain with without contrast in May 2023: Foci of T2 hyperintensity in the periventricular greater than subcortical white matter in identified and consistent with history of multiple sclerosis. No definite new lesion. Minimal posterior fossa involvement is also unchanged. There is no abnormal enhancement. MRI thoracic spine (with and without) demonstrating: November 2020 - Chronic demyelinating plaques at C7,T6-7, T9, T10, T12-L1. - No acute plaques.  MRI cervical spine (with and without) demonstrating: November 2020 -  Chronic demyelinating plaques at C2, C3, C4, and C6-7 spinal cord; also noted in the brainstem.   JC virus titer was negative  HISTORY OF PRESENT ILLNESS: She was diagnosed with RRMS in 2007, 6 month post partum, she had numbness from waist done, diagnosis was confirmed by marked abnormal MRI brain, in March,2007. There were multiple T2/Flair periventricular lesions, corpus callosum involvement, mutiple ring enhancing lesions. Also had enhancing lesion in cervical and thoracic spines. She was treated at [facility] by Dr. Susana Enter, no csf study, but had extensive lab evaluations to rule out mimics. 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 She denies visual changes or other clear relapsing episodes.    MRI brain  in 01/2007, multiple T2/FLAIR lesions in both hemisphere with corpus callosum involvement, there are slight increase in lesion load. MRI cervical, stable lesions in C6-7, and C2-3. Thoracic, multiple non enhancing lesions.    She tolerated Rebif well, no flu like illness, but complains of bilateral arm, and the lateral thigh lipoatrophy with repeat Rebif injection. There was no clear relapsed over the years, but she has slow worsening gait difficulty over the past 6 months.  MRI in Sept 2012, MRI of thoracic spine Multiple chronic plaques at C7, T6-7, T8-9, T10.   MRI of cervical spine ,Multiple chronic demyelinating plaques at C3, C4 and C7. MRI of brain Multiple round and ovioid, periventricular and subcortical chronic demyelinating plaques. 2 plaques noted in the pons. No abnormal lesions are  seen on post contrast views.  Her JC-virus antibody was negative. After discuss, we started her on Tysarbri infusion since February of 2013. She tolerated the infusion very well, but  complains of excessive fatigue over the past few months, she still able to work full-time as a Engineer, civil (consulting), she complains of difficulty dragging herself out of bed. Modafinil was not approved by insurance.She was put on Ritalin  10mg  tid, which helped her fatigue. feels much better on Tysabri .   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, with short staffed, she has to work extra hours, does complains of fatigue, on polypharmacy treatment, including Effexor  XR 75 mg daily, baclofen  10 mg 2 tablets 3 times a day, tizanidine  2 mg 3 times a day, also on gabapentin  600 mg 3 times a day   Recently she has intermittent sharp radiating pain at right face from the right forehead to break in the eye corner, to below cheek area, gabapentin  provide limited help,   Last MRI of the brain was in November 2020, personally reviewed the films, MRI of the brain multiple round and oval periventricular, subcortical, pontine chronic demyelinating plaques, no acute abnormality, no change from MRI compared with January 2019, MRI of cervical and thoracic spine showed chronic demyelinating plaque at C2, C3, 4, and C6- C7, T6-7, T9, T10, T12 and L1, no enhancement  Update February 22, 2022: She is overall well, weight loss program, continue to work full-time as a Engineer, 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, low back pain has improved, but still have intermittent radiating pain to left lower extremity  She complains of fatigue, lack of stamina, do have excessive sleepiness, loud snoring,  A1c is 6.7, vitamin D  39.6, IgM was 9, normal IgG 780, CPK 194, negative HIV, hepatitis C,  Personally reviewed MRI of lumbar spine in March 2022, severe spinal stenosis L4-5  level due to pseudo disc bulging, facet hypertrophy right posterior lateral bone spurring,  MRI of the brain with without contrast in May 2023, stable MS lesions involving periventricular, subcortical, no contrast-enhancement  UPDATE Dec 16th 2024: She is overall doing very well, no flareup of MS, tolerating ocrelizumab  infusion, last infusion was January 24, 2023, next due in May 2025, continue to work as a Engineer, civil (consulting),  Laboratory in September 2024 normal vitamin D , B12, IgM was decreased  10, with well-preserved IgG 835  UPDATE September 04 2023: Last ocrelizumab  Infusion was on Jan 24 2023, her IgM was low, intent to extend every 6 months infusion to every 8 months, she did notice mild increased gait abnormality, also concerned about the long chair time, increased the fatigue at the end of infusion cycle, would like to try physical therapy and also switch Briumvi, last MRI of the brain was in 2023, stable lesion  Recent few months, at evening time, after relaxing from work, complains of leg jumping and urge to move,    REVIEW OF SYSTEMS: Out of a complete 14 system review of symptoms, the patient complains only of the following symptoms, and all other reviewed systems are negative.  PHYSICAL EXAM: Today's Vitals   09/04/23 1611  BP: (!) 112/58  Pulse: 81  Resp: 15  SpO2: 93%  Height: 5' 3 (1.6 m)   Body mass  index is 31 kg/m.   PHYSICAL EXAMNIATION:  Gen: NAD, conversant, well nourised, well groomed                     Cardiovascular: Regular rate rhythm, no peripheral edema, warm, nontender. Eyes: Conjunctivae clear without exudates or hemorrhage Neck: Supple, no carotid bruits. Pulmonary: Clear to auscultation bilaterally   NEUROLOGICAL EXAM:  MENTAL STATUS: Speech/cognition: Awake, alert oriented to history taking and casual conversation  CRANIAL NERVES: CN II: Visual fields are full to confrontation.  Pupils are round equal and briskly reactive to light. CN III, IV, VI:  extraocular movement are normal. No ptosis. CN V: Facial sensation is intact to pinprick in all 3 divisions bilaterally. Corneal responses are intact.  CN VII: Face is symmetric with normal eye closure and smile. CN VIII: Hearing is normal to casual conversation CN IX, X: Palate elevates symmetrically. Phonation is normal. CN XI: Head turning and shoulder shrug are intact CN XII: Tongue is midline with normal movements and no atrophy.  MOTOR: There is no pronator drift of out-stretched arms. Muscle bulk and tone are normal. Muscle strength is normal.  REFLEXES: Reflexes are 2+ and symmetric at the biceps, triceps, knees, and ankles. Plantar responses are flexor.  SENSORY: Intact to light touch, pinprick, positional and vibratory sensation are intact in fingers and toes.  COORDINATION: Rapid alternating movements and fine finger movements are intact. There is no dysmetria on finger-to-nose and heel-knee-shin.    GAIT/STANCE: Need push up, stiff, cautious, mildly unsteady  ALLERGIES: No Known Allergies   HOME MEDICATIONS: Outpatient Medications Prior to Visit  Medication Sig Dispense Refill   acetaminophen  (TYLENOL ) 500 MG tablet Take 1,000 mg by mouth every 6 (six) hours as needed for mild pain.     aspirin  EC 81 MG tablet Take 81 mg by mouth daily.     atorvastatin  (LIPITOR ) 80 MG tablet TAKE 1 TABLET BY MOUTH DAILY AT 6 PM. 30 tablet 11   baclofen  (LIORESAL ) 10 MG tablet TAKE 2 TABLETS BY MOUTH 3 TIMES A DAY 540 tablet 3   Continuous Blood Gluc Receiver (FREESTYLE LIBRE 2 READER) DEVI 1 Device by Does not apply route 4 (four) times daily. 1 each 0   Continuous Glucose Sensor (FREESTYLE LIBRE 2 SENSOR) MISC USE TO CHECK SUGAR 4 (FOUR) TIMES DAILY. DX E11.65 2 each 11   cyclobenzaprine  (FLEXERIL ) 5 MG tablet TAKE 1 TABLET BY MOUTH TWICE A DAY (Patient taking differently: Take 5 mg by mouth daily as needed.) 60 tablet 11   gabapentin  (NEURONTIN ) 300 MG capsule TAKE 2 CAPSULES BY  MOUTH 3 TIMES DAILY. 540 capsule 3   lisinopril  (ZESTRIL ) 10 MG tablet TAKE 1 TABLET BY MOUTH TWICE A DAY 60 tablet 10   metoprolol  tartrate (LOPRESSOR ) 25 MG tablet TAKE 1 TABLET BY MOUTH TWICE A DAY 60 tablet 11   ocrelizumab  (OCREVUS ) 300 MG/10ML injection Inject 20 mLs (600 mg total) into the vein every 6 (six) months. 20 mL 0   tirzepatide  (MOUNJARO ) 7.5 MG/0.5ML Pen Inject 7.5 mg into the skin once a week. 6 mL 3   traZODone  (DESYREL ) 50 MG tablet Take 1 tablet (50 mg total) by mouth at bedtime. 90 tablet 1   venlafaxine  XR (EFFEXOR -XR) 150 MG 24 hr capsule Take 1 capsule (150 mg total) by mouth daily with breakfast. 90 capsule 1   venlafaxine  XR (EFFEXOR -XR) 75 MG 24 hr capsule Take 1 capsule (75 mg total) by mouth daily with breakfast. 90 capsule 1  No facility-administered medications prior to visit.     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   LIMA to LAD off pump     PAST SURGICAL HISTORY: Past Surgical History:  Procedure Laterality Date   CARDIAC CATHETERIZATION N/A 04/05/2015   Procedure: Left Heart Cath and Coronary Angiography;  Surgeon: Millicent Ally, MD;  Location: [facility] INVASIVE CV LAB;  Service: Cardiovascular;  Laterality: N/A;   CERVICAL CONE BIOPSY  2004   CESAREAN SECTION     1996, 2006   CHOLECYSTECTOMY     1988   COLPOSCOPY  2004   CORONARY ARTERY BYPASS GRAFT N/A 04/07/2015   Procedure: CORONARY ARTERY BYPASS GRAFTING (CABG), OFF PUMP, TIMES ONE, USING LEFT INTERNAL MAMMARY ARTERY;  Surgeon: Gardenia Jump, MD;   Location: [facility] OR;  Service: Open Heart Surgery;  Laterality: N/A;  LIMA to LAD   TEE WITHOUT CARDIOVERSION N/A 04/07/2015   Procedure: TRANSESOPHAGEAL ECHOCARDIOGRAM (TEE);  Surgeon: Gardenia Jump, MD;  Location: [facility];  Service: Open Heart Surgery;  Laterality: N/A;     FAMILY HISTORY: Family History  Problem Relation Age of Onset   Diabetes Mother    Hypertension Mother    Hypertension Father    Atrial fibrillation Father    Diabetes Sister    Breast cancer Maternal Aunt    Arrhythmia Sister    Heart attack Maternal Grandfather    Stroke Neg Hx      SOCIAL HISTORY: Social History   Socioeconomic History   Marital status: Married    Spouse name: Not on file   Number of children: 4   Years of education: college   Highest education level: Not on file  Occupational History   Occupation: Teacher, adult education: [employer]  Tobacco Use   Smoking status: Former    Current packs/day: 0.00    Average packs/day: 1.5 packs/day for 15.0 years (22.5 ttl pk-yrs)    Types: Cigarettes    Start date: 02/02/1989    Quit date: 02/03/2004    Years since quitting: 19.5   Smokeless tobacco: Never  Substance and Sexual Activity   Alcohol use: Yes    Alcohol/week: 1.0 standard drink of alcohol    Types: 1 Glasses of wine per week   Drug use: No   Sexual activity: Yes    Partners: Male    Birth control/protection: Surgical    Comment: vasectomy  Other Topics Concern   Not on file  Social History Narrative   Patient lives at home with her husband and children. Patient works for the [employer] in [place]. As a Engineer, civil (consulting). College education.    Social Drivers of Corporate investment banker Strain: Not on file  Food Insecurity: Not on file  Transportation Needs: Not on file  Physical Activity: Not on file  Stress: Not on file  Social Connections: Not on file  Intimate Partner Violence: Not on file     Phebe Brasil, M.D.  Ph.D.  [facility] [address] [place], [place] [address] Phone: [phone] Fax:      [phone]

## 2023-09-05 ENCOUNTER — Telehealth: Payer: Self-pay | Admitting: Neurology

## 2023-09-05 ENCOUNTER — Telehealth: Payer: Self-pay

## 2023-09-05 LAB — HIV ANTIBODY (ROUTINE TESTING W REFLEX): HIV Screen 4th Generation wRfx: NONREACTIVE

## 2023-09-05 LAB — HEPATITIS C ANTIBODY: Hep C Virus Ab: NONREACTIVE

## 2023-09-05 NOTE — Telephone Encounter (Signed)
  Faxed start form and insurance info to [facility] patient support

## 2023-09-05 NOTE — Telephone Encounter (Signed)
 Referral for physical therapy sent through [facility] to [facility].  Phone: [phone]

## 2023-09-09 ENCOUNTER — Ambulatory Visit: Payer: Self-pay | Admitting: Neurology

## 2023-09-09 LAB — CBC WITH DIFFERENTIAL/PLATELET
Basophils Absolute: 0.1 10*3/uL (ref 0.0–0.2)
Basos: 1 %
EOS (ABSOLUTE): 0.2 10*3/uL (ref 0.0–0.4)
Eos: 3 %
Hematocrit: 41.7 % (ref 34.0–46.6)
Hemoglobin: 14 g/dL (ref 11.1–15.9)
Immature Grans (Abs): 0 10*3/uL (ref 0.0–0.1)
Immature Granulocytes: 0 %
Lymphocytes Absolute: 2.7 10*3/uL (ref 0.7–3.1)
Lymphs: 41 %
MCH: 30.8 pg (ref 26.6–33.0)
MCHC: 33.6 g/dL (ref 31.5–35.7)
MCV: 92 fL (ref 79–97)
Monocytes Absolute: 0.5 10*3/uL (ref 0.1–0.9)
Monocytes: 8 %
Neutrophils Absolute: 3 10*3/uL (ref 1.4–7.0)
Neutrophils: 47 %
Platelets: 353 10*3/uL (ref 150–450)
RBC: 4.55 x10E6/uL (ref 3.77–5.28)
RDW: 12.3 % (ref 11.7–15.4)
WBC: 6.5 10*3/uL (ref 3.4–10.8)

## 2023-09-09 LAB — COMPREHENSIVE METABOLIC PANEL WITH GFR
ALT: 20 IU/L (ref 0–32)
AST: 22 IU/L (ref 0–40)
Albumin: 4.5 g/dL (ref 3.8–4.9)
Alkaline Phosphatase: 60 IU/L (ref 44–121)
BUN/Creatinine Ratio: 19 (ref 9–23)
BUN: 18 mg/dL (ref 6–24)
Bilirubin Total: 0.3 mg/dL (ref 0.0–1.2)
CO2: 21 mmol/L (ref 20–29)
Calcium: 9.3 mg/dL (ref 8.7–10.2)
Chloride: 99 mmol/L (ref 96–106)
Creatinine, Ser: 0.95 mg/dL (ref 0.57–1.00)
Globulin, Total: 2.3 g/dL (ref 1.5–4.5)
Glucose: 137 mg/dL — ABNORMAL HIGH (ref 70–99)
Potassium: 4.4 mmol/L (ref 3.5–5.2)
Sodium: 140 mmol/L (ref 134–144)
Total Protein: 6.8 g/dL (ref 6.0–8.5)
eGFR: 70 mL/min/{1.73_m2} (ref >59)

## 2023-09-09 LAB — VITAMIN D 25 HYDROXY (VIT D DEFICIENCY, FRACTURES): Vit D, 25-Hydroxy: 48.9 ng/mL (ref 30.0–100.0)

## 2023-09-09 LAB — QUANTIFERON-TB GOLD PLUS
QuantiFERON Mitogen Value: >10 [IU]/mL
QuantiFERON Nil Value: 0.07 [IU]/mL
QuantiFERON TB1 Ag Value: 0.07 [IU]/mL
QuantiFERON TB2 Ag Value: 0.07 [IU]/mL
QuantiFERON-TB Gold Plus: NEGATIVE

## 2023-09-09 LAB — HEPATITIS B SURFACE ANTIBODY,QUALITATIVE: Hep B Surface Ab, Qual: NONREACTIVE

## 2023-09-09 LAB — CD20 B CELLS
% CD19-B Cells: 1.4 % — ABNORMAL LOW (ref 4.6–22.1)
% CD20-B Cells: 1.4 % — ABNORMAL LOW (ref 5.0–22.3)

## 2023-09-09 LAB — HEPATITIS B SURFACE ANTIGEN: Hepatitis B Surface Ag: NEGATIVE

## 2023-09-09 LAB — IGG, IGA, IGM
IgA/Immunoglobulin A, Serum: 166 mg/dL (ref 87–352)
IgG (Immunoglobin G), Serum: 814 mg/dL (ref 586–1602)
IgM (Immunoglobulin M), Srm: 11 mg/dL — ABNORMAL LOW (ref 26–217)

## 2023-09-09 LAB — HEPATITIS B CORE ANTIBODY, TOTAL: Hep B Core Total Ab: NEGATIVE

## 2023-09-14 ENCOUNTER — Other Ambulatory Visit: Payer: Self-pay | Admitting: Family Medicine

## 2023-09-14 DIAGNOSIS — F5101 Primary insomnia: Secondary | ICD-10-CM

## 2023-09-17 ENCOUNTER — Ambulatory Visit
Admission: RE | Admit: 2023-09-17 | Discharge: 2023-09-17 | Disposition: A | Discharge status: Home / Self Care | Source: Ambulatory Visit | Attending: Neurology | Admitting: Neurology

## 2023-09-17 DIAGNOSIS — G35 Multiple sclerosis: Secondary | ICD-10-CM | POA: Diagnosis not present

## 2023-09-17 DIAGNOSIS — G2581 Restless legs syndrome: Secondary | ICD-10-CM

## 2023-09-17 DIAGNOSIS — R269 Unspecified abnormalities of gait and mobility: Secondary | ICD-10-CM

## 2023-09-17 MED ORDER — GADOPICLENOL 0.5 MMOL/ML IV SOLN
7.0000 mL | Freq: Once | INTRAVENOUS | Status: AC | PRN
  Administered 2023-09-17: 7 mL via INTRAVENOUS

## 2023-09-19 ENCOUNTER — Ambulatory Visit: Attending: Neurology

## 2023-09-19 DIAGNOSIS — G35 Multiple sclerosis: Secondary | ICD-10-CM | POA: Insufficient documentation

## 2023-09-19 DIAGNOSIS — R269 Unspecified abnormalities of gait and mobility: Secondary | ICD-10-CM | POA: Diagnosis not present

## 2023-09-19 DIAGNOSIS — M6281 Muscle weakness (generalized): Secondary | ICD-10-CM | POA: Insufficient documentation

## 2023-09-19 DIAGNOSIS — R2681 Unsteadiness on feet: Secondary | ICD-10-CM | POA: Insufficient documentation

## 2023-09-19 NOTE — Therapy (Signed)
 OUTPATIENT PHYSICAL THERAPY NEURO EVALUATION   Patient Name: Kaylee Bryant MRN: [id number] DOB:April 23, 1965, 58 y.o., female Today's Date: 09/19/2023   PCP: Severa Rock HERO, FNP REFERRING PROVIDER: Onita Duos, MD  END OF SESSION:  PT End of Session - 09/19/23 0849     Visit Number 1    Number of Visits 12    Date for PT Re-Evaluation 11/22/23    PT Start Time 0849    PT Stop Time 0920    PT Time Calculation (min) 31 min    Activity Tolerance Patient tolerated treatment well    Behavior During Therapy [facility] for tasks assessed/performed          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 (HCC) 04/05/2015   DDD (degenerative disc disease), lumbar    DM (diabetes mellitus), gestational 2006   insulin  at the time   Fatigue    Heart attack (HCC)    IBS (irritable bowel syndrome)    with chronic constipation   Multiple sclerosis (HCC)    Obesity    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   LIMA to LAD off pump   Past Surgical History:  Procedure Laterality Date   CARDIAC CATHETERIZATION N/A 04/05/2015   Procedure: Left Heart Cath and Coronary Angiography;  Surgeon: Debby DELENA Sor, MD;  Location: [facility] INVASIVE CV LAB;  Service: Cardiovascular;  Laterality: N/A;   CERVICAL CONE BIOPSY  2004   CESAREAN SECTION     1996, 2006   CHOLECYSTECTOMY     1988   COLPOSCOPY  2004   CORONARY ARTERY BYPASS GRAFT N/A 04/07/2015   Procedure: CORONARY ARTERY BYPASS GRAFTING (CABG), OFF PUMP, TIMES ONE, USING LEFT INTERNAL MAMMARY ARTERY;  Surgeon: Sudie VEAR Laine, MD;  Location: [facility] OR;  Service: Open Heart Surgery;  Laterality: N/A;  LIMA to LAD   TEE WITHOUT CARDIOVERSION N/A 04/07/2015   Procedure: TRANSESOPHAGEAL ECHOCARDIOGRAM (TEE);  Surgeon: Sudie VEAR Laine, MD;  Location: [facility] OR;  Service: Open Heart Surgery;  Laterality: N/A;   Patient Active Problem List   Diagnosis Date  Noted   High risk medication use 09/04/2023   Primary insomnia 08/23/2022   Chronic fatigue 02/22/2022   Diabetes mellitus (HCC) 07/13/2021   Uncontrolled type 2 diabetes mellitus with hyperglycemia (HCC) 12/15/2020   Depression, recurrent (HCC) 12/15/2020   Coronary artery disease involving native coronary artery of native heart without angina pectoris 12/15/2020   History of non-ST elevation myocardial infarction (NSTEMI) 12/15/2020   Spasticity 08/03/2020   Spinal stenosis of lumbar region 08/03/2020   Lumbar radiculopathy 08/03/2020   Relapsing remitting multiple sclerosis (HCC) 05/02/2020   Hypertension associated with type 2 diabetes mellitus (HCC) 06/19/2019   Gait abnormality 07/10/2017   Degeneration of lumbar intervertebral disc 05/30/2017   Restless leg syndrome 07/27/2015   Hyperlipidemia associated with type 2 diabetes mellitus (HCC) 07/26/2015   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   NSTEMI (non-ST elevated myocardial infarction) (HCC)    S/P cholecystectomy 06/02/2013   IBS (irritable bowel syndrome) 06/02/2013   Multiple sclerosis (HCC) 09/02/2012   Abnormality of gait 09/02/2012   Morbid obesity (HCC) 08/06/2011   Carcinoma in situ of cervix uteri 08/06/2011    ONSET DATE: 20 years ago (initial diagnosis of MS)  REFERRING DIAG: Relapsing remitting multiple sclerosis, gait abnormality  THERAPY DIAG:  Unsteadiness on feet  Muscle weakness (generalized)  Rationale  for Evaluation and Treatment: Rehabilitation  SUBJECTIVE:                                                                                                                                                                                             SUBJECTIVE STATEMENT: Patient reports that she has been dragging her right leg occasionally while she walks for about the past 6 months. However, she has been diagnosed with MS for about 20 years. She notes that it has progressively gotten worse. She notes that she trips a  lot, but she has not fallen because of the leg. She has fallen once, but that was because of her dog. She is right hand dominant.  Pt accompanied by: self  PERTINENT HISTORY: History of a MI, hypertension, type 2 diabetes, and depression  PAIN:  Are you having pain? Yes: NPRS scale: 3-4/10 Pain location: low back Pain description: very achy  Aggravating factors: prolonged standing  PRECAUTIONS: None  RED FLAGS: None   WEIGHT BEARING RESTRICTIONS: No  FALLS: Has patient fallen in last 6 months? Yes. Number of falls 2  LIVING ENVIRONMENT: Lives with: lives with their spouse Lives in: House/apartment Stairs: Yes: External: 1 steps; none Has following equipment at home: None  PLOF: Independent  PATIENT GOALS: improved strength and improved stability   OBJECTIVE:  Note: Objective measures were completed at Evaluation unless otherwise noted.  DIAGNOSTIC FINDINGS: 09/17/23 brain MRI IMPRESSION: Scan of the brain with and without contrast showing scattered bilateral supratentorial periventricular, subcortical and juxtacortical T2/FLAIR white matter hyperintensities in a pattern and distribution compatible with chronic demyelinating disease.  No enhancing lesions are noted.  No significant change compared with previous MRI from 08/17/2021.  COGNITION: Overall cognitive status: Within functional limits for tasks assessed   SENSATION: Patient reports numbness and tingling in both hands and feet, but it has been like this for approximately 18 years  MUSCLE TONE: [facility] for activities assessed  LOWER EXTREMITY ROM: WFL for activities assessed  UPPER EXTREMITY MMT:  MMT Right eval Left eval  Shoulder flexion 4/5 4/5  Shoulder extension    Shoulder abduction 4/5 4/5  Shoulder adduction    Shoulder extension    Shoulder internal rotation    Shoulder external rotation    Middle trapezius    Lower trapezius    Elbow flexion 5/5 5/5  Elbow extension 5/5 5/5  Wrist flexion     Wrist extension    Wrist ulnar deviation    Wrist radial deviation    Wrist pronation    Wrist supination    Grip strength 25 35   (Blank rows = not  tested)   LOWER EXTREMITY MMT:    MMT Right Eval Left Eval  Hip flexion 3+/5 4-/5  Hip extension    Hip abduction    Hip adduction    Hip internal rotation    Hip external rotation    Knee flexion 4/5 4+/5  Knee extension 4/5 4+/5  Ankle dorsiflexion 4/5 4/5  Ankle plantarflexion    Ankle inversion    Ankle eversion    (Blank rows = not tested)  TRANSFERS: Sit to stand: Complete Independence  Assistive device utilized: None     Stand to sit: Complete Independence  Assistive device utilized: None      BALANCE:  Narrow BOS, eyes open, firm surface: 30 seconds  Narrow BOS, eyes closed, firm surface: 30 seconds with increased sway  Tandem stance, eyes open, firm surface: 2 seconds each   GAIT: Findings: Gait Characteristics: step through pattern, decreased stride length, Right foot flat, Left foot flat, poor foot clearance- Right, and poor foot clearance- Left, Assistive device utilized:None, and Level of assistance: Complete Independence  FUNCTIONAL TESTS:  5 times sit to stand: 21.81 seconds without UE support Timed up and go (TUG): 15.69 seconds                                                                                       TREATMENT DATE:     PATIENT EDUCATION: Education details: Plan of care, prognosis, objective findings, and goals for physical therapy Person educated: Patient Education method: Explanation Education comprehension: verbalized understanding  HOME EXERCISE PROGRAM:   GOALS: Goals reviewed with patient? Yes  SHORT TERM GOALS: Target date: 10/10/23  Patient will be independent with her initial HEP. Baseline: Goal status: INITIAL  2.  Patient will improve her 5 times sit to stand time to 15 seconds or less for improved lower extremity power. Baseline:  Goal status: INITIAL  3.   Patient will improve her right hip flexor strength to at least 4/5 for improved functional mobility.  Baseline:  Goal status: INITIAL  LONG TERM GOALS: Target date: 10/31/23  Patient will be independent with her advanced HEP. Baseline:  Goal status: INITIAL  2.  Patient will improve her 5 times sit to stand time to 12 seconds or less to reduce her fall risk. Baseline:  Goal status: INITIAL  3.  Patient will improve her timed up and go time to 12 seconds or less to reduce her fall risk. Baseline:  Goal status: INITIAL  4.  Patient will improve her tandem balance to at least 5 seconds bilaterally for improved safety navigating with a narrow base of support. Baseline:  Goal status: INITIAL  ASSESSMENT:  CLINICAL IMPRESSION: Patient is a 58 y.o. female who was seen today for physical therapy evaluation and treatment for unsteadiness on her feet secondary to multiple sclerosis. She is a high fall risk as evidenced by her objective measures, functional testing, and her history of falling. She continues to require skilled physical therapy to address her impairments to maximize her safety and functional mobility.    OBJECTIVE IMPAIRMENTS: Abnormal gait, decreased balance, decreased mobility, decreased strength, and pain.   ACTIVITY LIMITATIONS: standing and locomotion  level  PARTICIPATION LIMITATIONS: community activity and yard work  PERSONAL FACTORS: Time since onset of injury/illness/exacerbation and 3+ comorbidities: History of a MI, hypertension, type 2 diabetes, and depression are also affecting patient's functional outcome.   REHAB POTENTIAL: Good  CLINICAL DECISION MAKING: Evolving/moderate complexity  EVALUATION COMPLEXITY: Moderate  PLAN:  PT FREQUENCY: 2x/week  PT DURATION: 6 weeks  PLANNED INTERVENTIONS: 97164- PT Re-evaluation, 97750- Physical Performance Testing, 97110-Therapeutic exercises, 97530- Therapeutic activity, 97112- Neuromuscular re-education, 97535- Self  Care, 02859- Manual therapy, [phone]- Gait training, Patient/Family education, Balance training, Stair training, Cryotherapy, and Moist heat  PLAN FOR NEXT SESSION: Nustep, lower extremity strengthening, balance training, and gait training   Lacinda JAYSON Fass, PT 09/19/2023, 2:25 PM

## 2023-09-25 ENCOUNTER — Ambulatory Visit: Attending: Neurology

## 2023-09-25 DIAGNOSIS — M6281 Muscle weakness (generalized): Secondary | ICD-10-CM | POA: Insufficient documentation

## 2023-09-25 DIAGNOSIS — R2681 Unsteadiness on feet: Secondary | ICD-10-CM | POA: Insufficient documentation

## 2023-09-25 NOTE — Therapy (Signed)
 OUTPATIENT PHYSICAL THERAPY NEURO TREATMENT   Patient Name: Kaylee Bryant MRN: [id number] DOB:05-May-1965, 58 y.o., female Today's Date: 09/25/2023   PCP: Severa Rock HERO, FNP REFERRING PROVIDER: Onita Duos, MD  END OF SESSION:  PT End of Session - 09/25/23 9146     Visit Number 2    Number of Visits 12    Date for PT Re-Evaluation 11/22/23    PT Start Time 0850   Patient arrived late to her appointment.   PT Stop Time 0929    PT Time Calculation (min) 39 min    Activity Tolerance Patient tolerated treatment well    Behavior During Therapy [facility] for tasks assessed/performed           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 (HCC) 04/05/2015   DDD (degenerative disc disease), lumbar    DM (diabetes mellitus), gestational 2006   insulin  at the time   Fatigue    Heart attack (HCC)    IBS (irritable bowel syndrome)    with chronic constipation   Multiple sclerosis (HCC)    Obesity    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   LIMA to LAD off pump   Past Surgical History:  Procedure Laterality Date   CARDIAC CATHETERIZATION N/A 04/05/2015   Procedure: Left Heart Cath and Coronary Angiography;  Surgeon: Debby DELENA Sor, MD;  Location: [facility] INVASIVE CV LAB;  Service: Cardiovascular;  Laterality: N/A;   CERVICAL CONE BIOPSY  2004   CESAREAN SECTION     1996, 2006   CHOLECYSTECTOMY     1988   COLPOSCOPY  2004   CORONARY ARTERY BYPASS GRAFT N/A 04/07/2015   Procedure: CORONARY ARTERY BYPASS GRAFTING (CABG), OFF PUMP, TIMES ONE, USING LEFT INTERNAL MAMMARY ARTERY;  Surgeon: Sudie VEAR Laine, MD;  Location: [facility] OR;  Service: Open Heart Surgery;  Laterality: N/A;  LIMA to LAD   TEE WITHOUT CARDIOVERSION N/A 04/07/2015   Procedure: TRANSESOPHAGEAL ECHOCARDIOGRAM (TEE);  Surgeon: Sudie VEAR Laine, MD;  Location: [facility] OR;  Service: Open Heart Surgery;  Laterality: N/A;   Patient  Active Problem List   Diagnosis Date Noted   High risk medication use 09/04/2023   Primary insomnia 08/23/2022   Chronic fatigue 02/22/2022   Diabetes mellitus (HCC) 07/13/2021   Uncontrolled type 2 diabetes mellitus with hyperglycemia (HCC) 12/15/2020   Depression, recurrent (HCC) 12/15/2020   Coronary artery disease involving native coronary artery of native heart without angina pectoris 12/15/2020   History of non-ST elevation myocardial infarction (NSTEMI) 12/15/2020   Spasticity 08/03/2020   Spinal stenosis of lumbar region 08/03/2020   Lumbar radiculopathy 08/03/2020   Relapsing remitting multiple sclerosis (HCC) 05/02/2020   Hypertension associated with type 2 diabetes mellitus (HCC) 06/19/2019   Gait abnormality 07/10/2017   Degeneration of lumbar intervertebral disc 05/30/2017   Restless leg syndrome 07/27/2015   Hyperlipidemia associated with type 2 diabetes mellitus (HCC) 07/26/2015   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   NSTEMI (non-ST elevated myocardial infarction) (HCC)    S/P cholecystectomy 06/02/2013   IBS (irritable bowel syndrome) 06/02/2013   Multiple sclerosis (HCC) 09/02/2012   Abnormality of gait 09/02/2012   Morbid obesity (HCC) 08/06/2011   Carcinoma in situ of cervix uteri 08/06/2011    ONSET DATE: 20 years ago (initial diagnosis of MS)  REFERRING DIAG: Relapsing remitting multiple sclerosis, gait abnormality  THERAPY DIAG:  Unsteadiness  on feet  Muscle weakness (generalized)  Rationale for Evaluation and Treatment: Rehabilitation  SUBJECTIVE:                                                                                                                                                                                             SUBJECTIVE STATEMENT: Patient reports that she feels good today.  Pt accompanied by: self  PERTINENT HISTORY: History of a MI, hypertension, type 2 diabetes, and depression  PAIN:  Are you having pain? Yes: NPRS scale: no pain  score reported Pain location: low back Pain description: very achy  Aggravating factors: prolonged standing  PRECAUTIONS: None  RED FLAGS: None   WEIGHT BEARING RESTRICTIONS: No  FALLS: Has patient fallen in last 6 months? Yes. Number of falls 2  LIVING ENVIRONMENT: Lives with: lives with their spouse Lives in: House/apartment Stairs: Yes: External: 1 steps; none Has following equipment at home: None  PLOF: Independent  PATIENT GOALS: improved strength and improved stability   OBJECTIVE:  Note: Objective measures were completed at Evaluation unless otherwise noted.  DIAGNOSTIC FINDINGS: 09/17/23 brain MRI IMPRESSION: Scan of the brain with and without contrast showing scattered bilateral supratentorial periventricular, subcortical and juxtacortical T2/FLAIR white matter hyperintensities in a pattern and distribution compatible with chronic demyelinating disease.  No enhancing lesions are noted.  No significant change compared with previous MRI from 08/17/2021.  COGNITION: Overall cognitive status: Within functional limits for tasks assessed   SENSATION: Patient reports numbness and tingling in both hands and feet, but it has been like this for approximately 18 years  MUSCLE TONE: [facility] for activities assessed  LOWER EXTREMITY ROM: WFL for activities assessed  UPPER EXTREMITY MMT:  MMT Right eval Left eval  Shoulder flexion 4/5 4/5  Shoulder extension    Shoulder abduction 4/5 4/5  Shoulder adduction    Shoulder extension    Shoulder internal rotation    Shoulder external rotation    Middle trapezius    Lower trapezius    Elbow flexion 5/5 5/5  Elbow extension 5/5 5/5  Wrist flexion    Wrist extension    Wrist ulnar deviation    Wrist radial deviation    Wrist pronation    Wrist supination    Grip strength 25 35   (Blank rows = not tested)   LOWER EXTREMITY MMT:    MMT Right Eval Left Eval  Hip flexion 3+/5 4-/5  Hip extension    Hip abduction    Hip  adduction    Hip internal rotation    Hip external rotation    Knee flexion 4/5 4+/5  Knee  extension 4/5 4+/5  Ankle dorsiflexion 4/5 4/5  Ankle plantarflexion    Ankle inversion    Ankle eversion    (Blank rows = not tested)  TRANSFERS: Sit to stand: Complete Independence  Assistive device utilized: None     Stand to sit: Complete Independence  Assistive device utilized: None      BALANCE:  Narrow BOS, eyes open, firm surface: 30 seconds  Narrow BOS, eyes closed, firm surface: 30 seconds with increased sway  Tandem stance, eyes open, firm surface: 2 seconds each   GAIT: Findings: Gait Characteristics: step through pattern, decreased stride length, Right foot flat, Left foot flat, poor foot clearance- Right, and poor foot clearance- Left, Assistive device utilized:None, and Level of assistance: Complete Independence  FUNCTIONAL TESTS:  5 times sit to stand: 21.81 seconds without UE support Timed up and go (TUG): 15.69 seconds                                                                                       TREATMENT DATE:                                     09/25/23 EXERCISE LOG  Exercise Repetitions and Resistance Comments  Nustep  L3 x 13 minutes   Rocker board  4 minutes   Static stance on foam  3 minutes  Without UE support   Lateral step up and over  4 step x 2 minutes  Intermittent UE support from parallel bars  LAQ  4# x 2.5 minutes Alternating LE  Seated hip ADD isometric  2 minutes w/ 5 second hold   Side stepping on foam  3 minutes  Intermittent UE support from parallel bars    Blank cell = exercise not performed today   PATIENT EDUCATION: Education details: POC and expectations following appointment Person educated: Patient Education method: Explanation Education comprehension: verbalized understanding  HOME EXERCISE PROGRAM:   GOALS: Goals reviewed with patient? Yes  SHORT TERM GOALS: Target date: 10/10/23  Patient will be independent with her  initial HEP. Baseline: Goal status: INITIAL  2.  Patient will improve her 5 times sit to stand time to 15 seconds or less for improved lower extremity power. Baseline:  Goal status: INITIAL  3.  Patient will improve her right hip flexor strength to at least 4/5 for improved functional mobility.  Baseline:  Goal status: INITIAL  LONG TERM GOALS: Target date: 10/31/23  Patient will be independent with her advanced HEP. Baseline:  Goal status: INITIAL  2.  Patient will improve her 5 times sit to stand time to 12 seconds or less to reduce her fall risk. Baseline:  Goal status: INITIAL  3.  Patient will improve her timed up and go time to 12 seconds or less to reduce her fall risk. Baseline:  Goal status: INITIAL  4.  Patient will improve her tandem balance to at least 5 seconds bilaterally for improved safety navigating with a narrow base of support. Baseline:  Goal status: INITIAL  ASSESSMENT:  CLINICAL IMPRESSION: Patient was introduced to multiple new  interventions for improved static and dynamic stability needed for improved functional mobility. She required minimal cueing with today's new interventions for proper exercise performance. She experienced no increase in pain or discomfort with any of today's interventions. She reported feeling pretty good upon the conclusion of treatment. She continues to require skilled physical therapy to address her remaining impairments to maximize her safety and functional mobility.   OBJECTIVE IMPAIRMENTS: Abnormal gait, decreased balance, decreased mobility, decreased strength, and pain.   ACTIVITY LIMITATIONS: standing and locomotion level  PARTICIPATION LIMITATIONS: community activity and yard work  PERSONAL FACTORS: Time since onset of injury/illness/exacerbation and 3+ comorbidities: History of a MI, hypertension, type 2 diabetes, and depression are also affecting patient's functional outcome.   REHAB POTENTIAL: Good  CLINICAL  DECISION MAKING: Evolving/moderate complexity  EVALUATION COMPLEXITY: Moderate  PLAN:  PT FREQUENCY: 2x/week  PT DURATION: 6 weeks  PLANNED INTERVENTIONS: 97164- PT Re-evaluation, 97750- Physical Performance Testing, 97110-Therapeutic exercises, 97530- Therapeutic activity, 97112- Neuromuscular re-education, 97535- Self Care, 02859- Manual therapy, [phone]- Gait training, Patient/Family education, Balance training, Stair training, Cryotherapy, and Moist heat  PLAN FOR NEXT SESSION: Nustep, lower extremity strengthening, balance training, and gait training   Lacinda JAYSON Fass, PT 09/25/2023, 12:18 PM

## 2023-09-26 ENCOUNTER — Other Ambulatory Visit: Payer: Self-pay | Admitting: Family Medicine

## 2023-09-26 DIAGNOSIS — E1165 Type 2 diabetes mellitus with hyperglycemia: Secondary | ICD-10-CM

## 2023-09-26 NOTE — Telephone Encounter (Signed)
 Name from pharmacy: FREESTYLE LIBRE 2 SENSOR  Pharmacy comment: Product Backordered/Unavailable:PRODUCT IS BEING DISCONTINIED AND UNAVAILABLE. PLEASE SEND OVER FOR EITHER FREESTYLE 3 PLUS (AVAILABLE) OR FREESTYLE 2 PLUS (ORDER

## 2023-10-01 ENCOUNTER — Ambulatory Visit

## 2023-10-01 DIAGNOSIS — R2681 Unsteadiness on feet: Secondary | ICD-10-CM | POA: Diagnosis not present

## 2023-10-01 DIAGNOSIS — M6281 Muscle weakness (generalized): Secondary | ICD-10-CM

## 2023-10-01 NOTE — Therapy (Signed)
 OUTPATIENT PHYSICAL THERAPY NEURO TREATMENT   Patient Name: Kaylee Bryant MRN: [id number] DOB:07-02-1965, 58 y.o., female Today's Date: 10/01/2023   PCP: Severa Rock HERO, FNP REFERRING PROVIDER: Onita Duos, MD  END OF SESSION:  PT End of Session - 10/01/23 1651     Visit Number 3    Number of Visits 12    Date for PT Re-Evaluation 11/22/23    PT Start Time 1643    PT Stop Time 1727    PT Time Calculation (min) 44 min    Activity Tolerance Patient tolerated treatment well    Behavior During Therapy [facility] for tasks assessed/performed            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 (HCC) 04/05/2015   DDD (degenerative disc disease), lumbar    DM (diabetes mellitus), gestational 2006   insulin  at the time   Fatigue    Heart attack (HCC)    IBS (irritable bowel syndrome)    with chronic constipation   Multiple sclerosis (HCC)    Obesity    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   LIMA to LAD off pump   Past Surgical History:  Procedure Laterality Date   CARDIAC CATHETERIZATION N/A 04/05/2015   Procedure: Left Heart Cath and Coronary Angiography;  Surgeon: Debby DELENA Sor, MD;  Location: [facility] INVASIVE CV LAB;  Service: Cardiovascular;  Laterality: N/A;   CERVICAL CONE BIOPSY  2004   CESAREAN SECTION     1996, 2006   CHOLECYSTECTOMY     1988   COLPOSCOPY  2004   CORONARY ARTERY BYPASS GRAFT N/A 04/07/2015   Procedure: CORONARY ARTERY BYPASS GRAFTING (CABG), OFF PUMP, TIMES ONE, USING LEFT INTERNAL MAMMARY ARTERY;  Surgeon: Sudie VEAR Laine, MD;  Location: [facility] OR;  Service: Open Heart Surgery;  Laterality: N/A;  LIMA to LAD   TEE WITHOUT CARDIOVERSION N/A 04/07/2015   Procedure: TRANSESOPHAGEAL ECHOCARDIOGRAM (TEE);  Surgeon: Sudie VEAR Laine, MD;  Location: [facility] OR;  Service: Open Heart Surgery;  Laterality: N/A;   Patient Active Problem List   Diagnosis Date  Noted   High risk medication use 09/04/2023   Primary insomnia 08/23/2022   Chronic fatigue 02/22/2022   Diabetes mellitus (HCC) 07/13/2021   Uncontrolled type 2 diabetes mellitus with hyperglycemia (HCC) 12/15/2020   Depression, recurrent (HCC) 12/15/2020   Coronary artery disease involving native coronary artery of native heart without angina pectoris 12/15/2020   History of non-ST elevation myocardial infarction (NSTEMI) 12/15/2020   Spasticity 08/03/2020   Spinal stenosis of lumbar region 08/03/2020   Lumbar radiculopathy 08/03/2020   Relapsing remitting multiple sclerosis (HCC) 05/02/2020   Hypertension associated with type 2 diabetes mellitus (HCC) 06/19/2019   Gait abnormality 07/10/2017   Degeneration of lumbar intervertebral disc 05/30/2017   Restless leg syndrome 07/27/2015   Hyperlipidemia associated with type 2 diabetes mellitus (HCC) 07/26/2015   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   NSTEMI (non-ST elevated myocardial infarction) (HCC)    S/P cholecystectomy 06/02/2013   IBS (irritable bowel syndrome) 06/02/2013   Multiple sclerosis (HCC) 09/02/2012   Abnormality of gait 09/02/2012   Morbid obesity (HCC) 08/06/2011   Carcinoma in situ of cervix uteri 08/06/2011    ONSET DATE: 20 years ago (initial diagnosis of MS)  REFERRING DIAG: Relapsing remitting multiple sclerosis, gait abnormality  THERAPY DIAG:  Unsteadiness on feet  Muscle weakness (generalized)  Rationale for Evaluation and Treatment: Rehabilitation  SUBJECTIVE:                                                                                                                                                                                             SUBJECTIVE STATEMENT: Patient reports that she feels good today.  Pt accompanied by: self  PERTINENT HISTORY: History of a MI, hypertension, type 2 diabetes, and depression  PAIN:  Are you having pain? Yes: NPRS scale: no pain score reported Pain location: low  back Pain description: very achy  Aggravating factors: prolonged standing  PRECAUTIONS: None  RED FLAGS: None   WEIGHT BEARING RESTRICTIONS: No  FALLS: Has patient fallen in last 6 months? Yes. Number of falls 2  LIVING ENVIRONMENT: Lives with: lives with their spouse Lives in: House/apartment Stairs: Yes: External: 1 steps; none Has following equipment at home: None  PLOF: Independent  PATIENT GOALS: improved strength and improved stability   OBJECTIVE:  Note: Objective measures were completed at Evaluation unless otherwise noted.  DIAGNOSTIC FINDINGS: 09/17/23 brain MRI IMPRESSION: Scan of the brain with and without contrast showing scattered bilateral supratentorial periventricular, subcortical and juxtacortical T2/FLAIR white matter hyperintensities in a pattern and distribution compatible with chronic demyelinating disease.  No enhancing lesions are noted.  No significant change compared with previous MRI from 08/17/2021.  COGNITION: Overall cognitive status: Within functional limits for tasks assessed   SENSATION: Patient reports numbness and tingling in both hands and feet, but it has been like this for approximately 18 years  MUSCLE TONE: [facility] for activities assessed  LOWER EXTREMITY ROM: WFL for activities assessed  UPPER EXTREMITY MMT:  MMT Right eval Left eval  Shoulder flexion 4/5 4/5  Shoulder extension    Shoulder abduction 4/5 4/5  Shoulder adduction    Shoulder extension    Shoulder internal rotation    Shoulder external rotation    Middle trapezius    Lower trapezius    Elbow flexion 5/5 5/5  Elbow extension 5/5 5/5  Wrist flexion    Wrist extension    Wrist ulnar deviation    Wrist radial deviation    Wrist pronation    Wrist supination    Grip strength 25 35   (Blank rows = not tested)   LOWER EXTREMITY MMT:    MMT Right Eval Left Eval  Hip flexion 3+/5 4-/5  Hip extension    Hip abduction    Hip adduction    Hip internal  rotation    Hip external rotation    Knee flexion 4/5 4+/5  Knee extension 4/5 4+/5  Ankle dorsiflexion 4/5  4/5  Ankle plantarflexion    Ankle inversion    Ankle eversion    (Blank rows = not tested)  TRANSFERS: Sit to stand: Complete Independence  Assistive device utilized: None     Stand to sit: Complete Independence  Assistive device utilized: None      BALANCE:  Narrow BOS, eyes open, firm surface: 30 seconds  Narrow BOS, eyes closed, firm surface: 30 seconds with increased sway  Tandem stance, eyes open, firm surface: 2 seconds each   GAIT: Findings: Gait Characteristics: step through pattern, decreased stride length, Right foot flat, Left foot flat, poor foot clearance- Right, and poor foot clearance- Left, Assistive device utilized:None, and Level of assistance: Complete Independence  FUNCTIONAL TESTS:  5 times sit to stand: 21.81 seconds without UE support Timed up and go (TUG): 15.69 seconds                                                                                       TREATMENT DATE:                                     10/01/23 EXERCISE LOG  Exercise Repetitions and Resistance Comments  Nustep  L3 x 15 minutes   Isometric ball press  2 minutes w/ 5 second hold   Ball roll out  3.5 minutes  For lumbar flexion  Eccentric heel tap  Attempted, but limited due to right hip pain   Rocker board  5 minutes        Blank cell = exercise not performed today                                    09/25/23 EXERCISE LOG  Exercise Repetitions and Resistance Comments  Nustep  L3 x 13 minutes   Rocker board  4 minutes   Static stance on foam  3 minutes  Without UE support   Lateral step up and over  4 step x 2 minutes  Intermittent UE support from parallel bars  LAQ  4# x 2.5 minutes Alternating LE  Seated hip ADD isometric  2 minutes w/ 5 second hold   Side stepping on foam  3 minutes  Intermittent UE support from parallel bars    Blank cell = exercise not performed today    PATIENT EDUCATION: Education details: prognosis, HEP, and following her physicians recommendations for her medications Person educated: Patient Education method: Explanation Education comprehension: verbalized understanding  HOME EXERCISE PROGRAM:   GOALS: Goals reviewed with patient? Yes  SHORT TERM GOALS: Target date: 10/10/23  Patient will be independent with her initial HEP. Baseline: Goal status: INITIAL  2.  Patient will improve her 5 times sit to stand time to 15 seconds or less for improved lower extremity power. Baseline:  Goal status: INITIAL  3.  Patient will improve her right hip flexor strength to at least 4/5 for improved functional mobility.  Baseline:  Goal status: INITIAL  LONG TERM GOALS: Target date: 10/31/23  Patient will  be independent with her advanced HEP. Baseline:  Goal status: INITIAL  2.  Patient will improve her 5 times sit to stand time to 12 seconds or less to reduce her fall risk. Baseline:  Goal status: INITIAL  3.  Patient will improve her timed up and go time to 12 seconds or less to reduce her fall risk. Baseline:  Goal status: INITIAL  4.  Patient will improve her tandem balance to at least 5 seconds bilaterally for improved safety navigating with a narrow base of support. Baseline:  Goal status: INITIAL  ASSESSMENT:  CLINICAL IMPRESSION: Patient was progressed with an isometric ball press and ball roll outs for improved lumbar stability and postural reeducation. She required minimal cueing with today's new interventions for proper exercise performance. She was educated on ways to modify these interventions to be completed at home. She reported understanding. She reported that her right hip was sore, but her legs felt better upon the conclusion of treatment. She continues to require skilled physical therapy to address her remaining impairments to maximize her functional mobility.   OBJECTIVE IMPAIRMENTS: Abnormal gait, decreased  balance, decreased mobility, decreased strength, and pain.   ACTIVITY LIMITATIONS: standing and locomotion level  PARTICIPATION LIMITATIONS: community activity and yard work  PERSONAL FACTORS: Time since onset of injury/illness/exacerbation and 3+ comorbidities: History of a MI, hypertension, type 2 diabetes, and depression are also affecting patient's functional outcome.   REHAB POTENTIAL: Good  CLINICAL DECISION MAKING: Evolving/moderate complexity  EVALUATION COMPLEXITY: Moderate  PLAN:  PT FREQUENCY: 2x/week  PT DURATION: 6 weeks  PLANNED INTERVENTIONS: 97164- PT Re-evaluation, 97750- Physical Performance Testing, 97110-Therapeutic exercises, 97530- Therapeutic activity, 97112- Neuromuscular re-education, 97535- Self Care, [id number]- Manual therapy, [phone]- Gait training, Patient/Family education, Balance training, Stair training, Cryotherapy, and Moist heat  PLAN FOR NEXT SESSION: Nustep, lower extremity strengthening, balance training, and gait training   Lacinda JAYSON Fass, PT 10/01/2023, 5:45 PM

## 2023-10-02 ENCOUNTER — Encounter: Payer: Self-pay | Admitting: Family Medicine

## 2023-10-02 ENCOUNTER — Ambulatory Visit (INDEPENDENT_AMBULATORY_CARE_PROVIDER_SITE_OTHER): Admitting: Family Medicine

## 2023-10-02 ENCOUNTER — Ambulatory Visit: Admitting: Family Medicine

## 2023-10-02 VITALS — BP 106/65 | HR 82 | Temp 97.7°F | Ht 63.0 in | Wt 170.8 lb

## 2023-10-02 DIAGNOSIS — F5101 Primary insomnia: Secondary | ICD-10-CM

## 2023-10-02 DIAGNOSIS — G35 Multiple sclerosis: Secondary | ICD-10-CM

## 2023-10-02 DIAGNOSIS — E1169 Type 2 diabetes mellitus with other specified complication: Secondary | ICD-10-CM

## 2023-10-02 DIAGNOSIS — I152 Hypertension secondary to endocrine disorders: Secondary | ICD-10-CM

## 2023-10-02 DIAGNOSIS — Z7985 Long-term (current) use of injectable non-insulin antidiabetic drugs: Secondary | ICD-10-CM

## 2023-10-02 DIAGNOSIS — F339 Major depressive disorder, recurrent, unspecified: Secondary | ICD-10-CM

## 2023-10-02 DIAGNOSIS — E1159 Type 2 diabetes mellitus with other circulatory complications: Secondary | ICD-10-CM | POA: Diagnosis not present

## 2023-10-02 DIAGNOSIS — E785 Hyperlipidemia, unspecified: Secondary | ICD-10-CM

## 2023-10-02 DIAGNOSIS — K5903 Drug induced constipation: Secondary | ICD-10-CM

## 2023-10-02 LAB — BAYER DCA HB A1C WAIVED: HB A1C (BAYER DCA - WAIVED): 6.8 % — ABNORMAL HIGH (ref 4.8–5.6)

## 2023-10-02 MED ORDER — VENLAFAXINE HCL ER 150 MG PO CP24
150.0000 mg | ORAL_CAPSULE | Freq: Every day | ORAL | 3 refills | Status: AC
Start: 2023-10-02 — End: ?

## 2023-10-02 MED ORDER — VENLAFAXINE HCL ER 75 MG PO CP24: 75.0000 mg | ORAL_CAPSULE | Freq: Every day | ORAL | 3 refills | Status: AC

## 2023-10-02 MED ORDER — TRAZODONE HCL 50 MG PO TABS: 50.0000 mg | ORAL_TABLET | Freq: Every day | ORAL | 3 refills | Status: DC

## 2023-10-02 NOTE — Progress Notes (Signed)
 Subjective:  Patient ID: Kaylee Bryant, female    DOB: 07-09-1965, 58 y.o.   MRN: [id number]  Patient Care Team: Severa Rock HERO, FNP as PCP - General (Family Medicine)   Chief Complaint:  Diabetes (3 month follow up ) and Constipation (Colace not helping )   HPI: Kaylee Bryant is a 58 y.o. female presenting on 10/02/2023 for Diabetes (3 month follow up ) and Constipation (Colace not helping )    The patient presents for follow-up of multiple sclerosis, diabetes, and constipation management.  They have recently started physical therapy, which has improved their balance. They experience chronic sciatic nerve pain and use an exercise ball at home for exercises. A recent fall while using the ball resulted in a bruised arm.  For diabetes management, they are using Mounjaro  but face insurance issues with coverage. They have two doses left and are concerned about the high copay and deductible costs. Their last dose was taken on "Sunday. They use a regular glucose monitor at home due to the cost of continuous glucose monitoring. Their A1c is 6.8, which is the best it has been in a while.  Regarding multiple sclerosis, they have a history of reactions to Ocrevus, requiring IV Benadryl and saline. Insurance issues have delayed a switch to a different medication. No new lesions on a recent MRI and no significant changes since the initial diagnosis. They recall significant mobility issues at the time of diagnosis but note no recent changes.  They experience constipation and take Caelyx, three tablets in the morning and evening, but report bowel movements only every three days and difficulty with them. They cannot use Miralax and have previously used Skodal. They express a need for effective management of this issue.  Socially, they recently changed jobs and are currently in orientation at a new workplace in [place], [place]. They previously worked at [employer] but left due to workplace  conflicts and dissatisfaction with management practices.          Relevant past medical, surgical, family, and social history reviewed and updated as indicated.  Allergies and medications reviewed and updated. Data reviewed: Chart in Epic.   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   LIMA to LAD off pump    Past Surgical History:  Procedure Laterality Date   CARDIAC CATHETERIZATION N/A 04/05/2015   Procedure: Left Heart Cath and Coronary Angiography;  Surgeon: Thomas A Kelly, MD;  Location: [facility];  Service: Cardiovascular;  Laterality: N/A;   CERVICAL CONE BIOPSY  2004   CESAREAN SECTION     1996, 2006   CHOLECYSTECTOMY     19" 88   COLPOSCOPY  2004   CORONARY ARTERY BYPASS GRAFT N/A 04/07/2015   Procedure: CORONARY ARTERY BYPASS GRAFTING (CABG), OFF PUMP, TIMES ONE, USING LEFT INTERNAL MAMMARY ARTERY;  Surgeon: Sudie VEAR Laine, MD;  Location: [facility] OR;  Service: Open Heart Surgery;  Laterality: N/A;  LIMA to LAD   TEE WITHOUT CARDIOVERSION N/A  04/07/2015   Procedure: TRANSESOPHAGEAL ECHOCARDIOGRAM (TEE);  Surgeon: Sudie VEAR Laine, MD;  Location: [facility] OR;  Service: Open Heart Surgery;  Laterality: N/A;    Social History   Socioeconomic History   Marital status: Married    Spouse name: Not on file   Number of children: 4   Years of education: college   Highest education level: Not on file  Occupational History   Occupation: Teacher, adult education: [employer]  Tobacco Use   Smoking status: Former    Current packs/day: 0.00    Average packs/day:  1.5 packs/day for 15.0 years (22.5 ttl pk-yrs)    Types: Cigarettes    Start date: 02/02/1989    Quit date: 02/03/2004    Years since quitting: 19.6   Smokeless tobacco: Never  Substance and Sexual Activity   Alcohol use: Yes    Alcohol/week: 1.0 standard drink of alcohol    Types: 1 Glasses of wine per week   Drug use: No   Sexual activity: Yes    Partners: Male    Birth control/protection: Surgical    Comment: vasectomy  Other Topics Concern   Not on file  Social History Narrative   Patient lives at home with her husband and children. Patient works for the [employer] in [place]. As a Engineer, civil (consulting). College education.    Social Drivers of Corporate investment banker Strain: Not on file  Food Insecurity: Not on file  Transportation Needs: Not on file  Physical Activity: Not on file  Stress: Not on file  Social Connections: Not on file  Intimate Partner Violence: Not on file    Outpatient Encounter Medications as of 10/02/2023  Medication Sig   acetaminophen  (TYLENOL ) 500 MG tablet Take 1,000 mg by mouth every 6 (six) hours as needed for mild pain.   aspirin  EC 81 MG tablet Take 81 mg by mouth daily.   atorvastatin  (LIPITOR ) 80 MG tablet TAKE 1 TABLET BY MOUTH DAILY AT 6 PM.   baclofen  (LIORESAL ) 10 MG tablet TAKE 2 TABLETS BY MOUTH 3 TIMES A DAY   gabapentin  (NEURONTIN ) 300 MG capsule Take 2 capsules (600 mg total) by mouth 4 (four) times daily.   lisinopril  (ZESTRIL ) 10 MG tablet TAKE 1 TABLET BY MOUTH TWICE A DAY   metoprolol  tartrate (LOPRESSOR ) 25 MG tablet TAKE 1 TABLET BY MOUTH TWICE A DAY   ocrelizumab  (OCREVUS ) 300 MG/10ML injection Inject 20 mLs (600 mg total) into the vein every 6 (six) months.   rOPINIRole  (REQUIP ) 0.5 MG tablet Take 2 tablets (1 mg total) by mouth 3 (three) times daily.   tirzepatide  (MOUNJARO ) 7.5 MG/0.5ML Pen Inject 7.5 mg into the skin once a week.   [DISCONTINUED] Continuous Blood Gluc Receiver (FREESTYLE LIBRE 2 READER) DEVI 1 Device by Does not  apply route 4 (four) times daily.   [DISCONTINUED] Continuous Glucose Sensor (FREESTYLE LIBRE 3 PLUS SENSOR) MISC QID   [DISCONTINUED] traZODone  (DESYREL ) 50 MG tablet TAKE 1 TABLET BY MOUTH EVERYDAY AT BEDTIME   [DISCONTINUED] venlafaxine  XR (EFFEXOR -XR) 150 MG 24 hr capsule Take 1 capsule (150 mg total) by mouth daily with breakfast.   [DISCONTINUED] venlafaxine  XR (EFFEXOR -XR) 75 MG 24 hr capsule Take 1 capsule (75 mg total) by mouth daily with breakfast.   traZODone  (DESYREL ) 50 MG tablet Take 1 tablet (50 mg total) by mouth at bedtime.   venlafaxine  XR (EFFEXOR -XR) 150 MG 24 hr capsule Take 1 capsule (150 mg total) by mouth daily with  breakfast.   venlafaxine  XR (EFFEXOR -XR) 75 MG 24 hr capsule Take 1 capsule (75 mg total) by mouth daily with breakfast.   No facility-administered encounter medications on file as of 10/02/2023.    No Known Allergies  Pertinent ROS per HPI, otherwise unremarkable      Objective:  BP 106/65   Pulse 82   Temp 97.7 F (36.5 C)   Ht 5' 3 (1.6 m)   Wt 170 lb 12.8 oz (77.5 kg)   LMP 10/03/2014 Comment: last period was approx 6 months ago, i don't have it anymore  SpO2 95%   BMI 30.26 kg/m    Wt Readings from Last 3 Encounters:  10/02/23 170 lb 12.8 oz (77.5 kg)  07/02/23 175 lb (79.4 kg)  04/29/23 163 lb (73.9 kg)    Physical Exam Vitals and nursing note reviewed.  Constitutional:      General: She is not in acute distress.    Appearance: Normal appearance. She is well-developed and well-groomed. She is obese. She is not ill-appearing, toxic-appearing or diaphoretic.  HENT:     Head: Normocephalic and atraumatic.     Jaw: There is normal jaw occlusion.     Right Ear: Hearing normal.     Left Ear: Hearing normal.     Nose: Nose normal.     Mouth/Throat:     Lips: Pink.     Mouth: Mucous membranes are moist.     Pharynx: Oropharynx is clear. Uvula midline.  Eyes:     General: Lids are normal.     Extraocular Movements: Extraocular  movements intact.     Conjunctiva/sclera: Conjunctivae normal.     Pupils: Pupils are equal, round, and reactive to light.  Neck:     Thyroid : No thyroid  mass, thyromegaly or thyroid  tenderness.     Vascular: No carotid bruit or JVD.     Trachea: Trachea and phonation normal.  Cardiovascular:     Rate and Rhythm: Normal rate and regular rhythm.     Chest Wall: PMI is not displaced.     Pulses: Normal pulses.     Heart sounds: Normal heart sounds. No murmur heard.    No friction rub. No gallop.  Pulmonary:     Effort: Pulmonary effort is normal. No respiratory distress.     Breath sounds: Normal breath sounds. No wheezing.  Abdominal:     General: Bowel sounds are normal. There is no distension or abdominal bruit.     Palpations: Abdomen is soft. There is no hepatomegaly or splenomegaly.     Tenderness: There is no abdominal tenderness. There is no right CVA tenderness or left CVA tenderness.     Hernia: No hernia is present.  Musculoskeletal:        General: Normal range of motion.     Cervical back: Normal range of motion and neck supple.     Right lower leg: No edema.     Left lower leg: No edema.  Lymphadenopathy:     Cervical: No cervical adenopathy.  Skin:    General: Skin is warm and dry.     Capillary Refill: Capillary refill takes less than 2 seconds.     Coloration: Skin is not cyanotic, jaundiced or pale.     Findings: No rash.  Neurological:     General: No focal deficit present.     Mental Status: She is alert and oriented to person, place, and time.     Sensory: Sensation is intact.  Motor: Motor function is intact.     Coordination: Coordination is intact.     Gait: Gait abnormal (slow, antalgic).     Deep Tendon Reflexes: Reflexes are normal and symmetric.  Psychiatric:        Attention and Perception: Attention and perception normal.        Mood and Affect: Mood and affect normal.        Speech: Speech normal.        Behavior: Behavior normal. Behavior  is cooperative.        Thought Content: Thought content normal.        Cognition and Memory: Cognition and memory normal.        Judgment: Judgment normal.     Results for orders placed or performed in visit on 09/04/23  VITAMIN D  25 Hydroxy (Vit-D Deficiency, Fractures)   Collection Time: 09/04/23  4:54 PM  Result Value Ref Range   Vit D, 25-Hydroxy 48.9 30.0 - 100.0 ng/mL  CBC with Differential/Platelet   Collection Time: 09/04/23  4:54 PM  Result Value Ref Range   WBC 6.5 3.4 - 10.8 x10E3/uL   RBC 4.55 3.77 - 5.28 x10E6/uL   Hemoglobin 14.0 11.1 - 15.9 g/dL   Hematocrit 58.2 65.9 - 46.6 %   MCV 92 79 - 97 fL   MCH 30.8 26.6 - 33.0 pg   MCHC 33.6 31.5 - 35.7 g/dL   RDW 87.6 88.2 - 84.5 %   Platelets 353 150 - 450 x10E3/uL   Neutrophils 47 Not Estab. %   Lymphs 41 Not Estab. %   Monocytes 8 Not Estab. %   Eos 3 Not Estab. %   Basos 1 Not Estab. %   Neutrophils Absolute 3.0 1.4 - 7.0 x10E3/uL   Lymphocytes Absolute 2.7 0.7 - 3.1 x10E3/uL   Monocytes Absolute 0.5 0.1 - 0.9 x10E3/uL   EOS (ABSOLUTE) 0.2 0.0 - 0.4 x10E3/uL   Basophils Absolute 0.1 0.0 - 0.2 x10E3/uL   Immature Granulocytes 0 Not Estab. %   Immature Grans (Abs) 0.0 0.0 - 0.1 x10E3/uL  Comprehensive metabolic panel with GFR   Collection Time: 09/04/23  4:54 PM  Result Value Ref Range   Glucose 137 (H) 70 - 99 mg/dL   BUN 18 6 - 24 mg/dL   Creatinine, Ser 9.04 0.57 - 1.00 mg/dL   eGFR 70 >40 fO/fpw/8.26   BUN/Creatinine Ratio 19 9 - 23   Sodium 140 134 - 144 mmol/L   Potassium 4.4 3.5 - 5.2 mmol/L   Chloride 99 96 - 106 mmol/L   CO2 21 20 - 29 mmol/L   Calcium  9.3 8.7 - 10.2 mg/dL   Total Protein 6.8 6.0 - 8.5 g/dL   Albumin  4.5 3.8 - 4.9 g/dL   Globulin, Total 2.3 1.5 - 4.5 g/dL   Bilirubin Total 0.3 0.0 - 1.2 mg/dL   Alkaline Phosphatase 60 44 - 121 IU/L   AST 22 0 - 40 IU/L   ALT 20 0 - 32 IU/L  IgG, IgA, IgM   Collection Time: 09/04/23  4:54 PM  Result Value Ref Range   IgG (Immunoglobin G),  Serum 814 586 - 1,602 mg/dL   IgA/Immunoglobulin A, Serum 166 87 - 352 mg/dL   IgM (Immunoglobulin M), Srm 11 (L) 26 - 217 mg/dL  RI79 B Cells   Collection Time: 09/04/23  4:54 PM  Result Value Ref Range   % CD19-B Cells 1.4 (L) 4.6 - 22.1 %   % CD20-B Cells 1.4 (  L) 5.0 - 22.3 %  QuantiFERON-TB Gold Plus   Collection Time: 09/04/23  4:54 PM  Result Value Ref Range   QuantiFERON Incubation Incubation performed.    QuantiFERON Criteria Comment    QuantiFERON TB1 Ag Value 0.07 IU/mL   QuantiFERON TB2 Ag Value 0.07 IU/mL   QuantiFERON Nil Value 0.07 IU/mL   QuantiFERON Mitogen Value >10.00 IU/mL   QuantiFERON-TB Gold Plus Negative Negative  Hepatitis B Core AB, Total   Collection Time: 09/04/23  4:54 PM  Result Value Ref Range   Hep B Core Total Ab Negative Negative  Hep B Surface Antigen   Collection Time: 09/04/23  4:54 PM  Result Value Ref Range   Hepatitis B Surface Ag Negative Negative  Hepatitis B surface antibody,qualitative   Collection Time: 09/04/23  4:54 PM  Result Value Ref Range   Hep B Surface Ab, Qual Non Reactive   Hepatitis C antibody   Collection Time: 09/04/23  4:55 PM  Result Value Ref Range   Hep C Virus Ab Non Reactive Non Reactive  HIV Antibody (routine testing w rflx)   Collection Time: 09/04/23  4:55 PM  Result Value Ref Range   HIV Screen 4th Generation wRfx Non Reactive Non Reactive       Pertinent labs & imaging results that were available during my care of the patient were reviewed by me and considered in my medical decision making.  Assessment & Plan:  Alyvia was seen today for diabetes and constipation.  Diagnoses and all orders for this visit:  Type 2 diabetes mellitus with other specified complication, without long-term current use of insulin  (HCC) -     Bayer DCA Hb A1c Waived  Hypertension associated with type 2 diabetes mellitus (HCC) -     Bayer DCA Hb A1c Waived  Hyperlipidemia associated with type 2 diabetes mellitus (HCC) -      Bayer DCA Hb A1c Waived  Depression, recurrent (HCC) -     venlafaxine  XR (EFFEXOR -XR) 75 MG 24 hr capsule; Take 1 capsule (75 mg total) by mouth daily with breakfast. -     venlafaxine  XR (EFFEXOR -XR) 150 MG 24 hr capsule; Take 1 capsule (150 mg total) by mouth daily with breakfast.  Primary insomnia -     traZODone  (DESYREL ) 50 MG tablet; Take 1 tablet (50 mg total) by mouth at bedtime.  Multiple sclerosis (HCC) Doing well. Followed by neurology.   Drug-induced constipation Discussed symptom management.   Sciatic Nerve Pain Chronic sciatic nerve pain managed with physical therapy, showing improvement in balance and pain management through exercises, including an exercise ball. - Continue physical therapy for balance and pain management.  Multiple Sclerosis (MS) Multiple sclerosis with no new lesions on recent MRI. Experiencing insurance issues affecting the switch from Ocrevus  to a new medication due to previous reactions, delaying treatment adjustments. Physical therapy is ongoing for balance improvement. - Address insurance issues to facilitate medication switch. - Continue physical therapy for balance improvement.  Diabetes Mellitus Diabetes management is well-controlled with an A1c of 6.8. Using Mounjaro  but facing insurance coverage issues with [employer] not covering the medication. Has two doses left and is concerned about the cost of the medication and CGM. Plans to notify when down to one dose to arrange for samples. Discussed using Ozempic  as a temporary alternative if Mounjaro  samples are unavailable. Emphasized dietary modifications to reduce sweets intake. - Notify provider when down to one dose of Mounjaro  to arrange for samples. - Consider using  Ozempic  as a temporary alternative if Mounjaro  samples are unavailable. - Encourage dietary modifications to reduce sweets intake.  Constipation Chronic constipation managed with Caelyx, but reports difficulty with  bowel movements every three days. Cannot tolerate Miralax and is seeking alternative solutions. Discussed Bloom prebiotic fiber and Colon Cleanse as potential options. - Recommend Bloom prebiotic fiber for regularity. - Suggest Colon Cleanse for acute constipation relief.          Continue all other maintenance medications.  Follow up plan: Return in about 3 months (around 01/02/2024) for chronic follow up.   Continue healthy lifestyle choices, including diet (rich in fruits, vegetables, and lean proteins, and low in salt and simple carbohydrates) and exercise (at least 30 minutes of moderate physical activity daily).  Educational handout given for DM  The above assessment and management plan was discussed with the patient. The patient verbalized understanding of and has agreed to the management plan. Patient is aware to call the clinic if they develop any new symptoms or if symptoms persist or worsen. Patient is aware when to return to the clinic for a follow-up visit. Patient educated on when it is appropriate to go to the emergency department.   Rosaline Bruns, FNP-C [facility] [phone]

## 2023-10-02 NOTE — Patient Instructions (Signed)

## 2023-10-03 ENCOUNTER — Ambulatory Visit

## 2023-10-03 ENCOUNTER — Ambulatory Visit: Payer: Self-pay | Admitting: Family Medicine

## 2023-10-03 DIAGNOSIS — R2681 Unsteadiness on feet: Secondary | ICD-10-CM | POA: Diagnosis not present

## 2023-10-03 DIAGNOSIS — M6281 Muscle weakness (generalized): Secondary | ICD-10-CM

## 2023-10-03 NOTE — Therapy (Signed)
 OUTPATIENT PHYSICAL THERAPY NEURO TREATMENT   Patient Name: Kaylee Bryant MRN: [id number] DOB:1965/08/25, 58 y.o., female Today's Date: 10/03/2023   PCP: Severa Rock HERO, FNP REFERRING PROVIDER: Onita Duos, MD  END OF SESSION:  PT End of Session - 10/03/23 1658     Visit Number 4    Number of Visits 12    Date for PT Re-Evaluation 11/22/23    PT Start Time 1645    PT Stop Time 1728    PT Time Calculation (min) 43 min    Activity Tolerance Patient tolerated treatment well    Behavior During Therapy [facility] for tasks assessed/performed            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 (HCC) 04/05/2015   DDD (degenerative disc disease), lumbar    DM (diabetes mellitus), gestational 2006   insulin  at the time   Fatigue    Heart attack (HCC)    IBS (irritable bowel syndrome)    with chronic constipation   Multiple sclerosis (HCC)    Obesity    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   LIMA to LAD off pump   Past Surgical History:  Procedure Laterality Date   CARDIAC CATHETERIZATION N/A 04/05/2015   Procedure: Left Heart Cath and Coronary Angiography;  Surgeon: Debby DELENA Sor, MD;  Location: [facility] INVASIVE CV LAB;  Service: Cardiovascular;  Laterality: N/A;   CERVICAL CONE BIOPSY  2004   CESAREAN SECTION     1996, 2006   CHOLECYSTECTOMY     1988   COLPOSCOPY  2004   CORONARY ARTERY BYPASS GRAFT N/A 04/07/2015   Procedure: CORONARY ARTERY BYPASS GRAFTING (CABG), OFF PUMP, TIMES ONE, USING LEFT INTERNAL MAMMARY ARTERY;  Surgeon: Sudie VEAR Laine, MD;  Location: [facility] OR;  Service: Open Heart Surgery;  Laterality: N/A;  LIMA to LAD   TEE WITHOUT CARDIOVERSION N/A 04/07/2015   Procedure: TRANSESOPHAGEAL ECHOCARDIOGRAM (TEE);  Surgeon: Sudie VEAR Laine, MD;  Location: [facility] OR;  Service: Open Heart Surgery;  Laterality: N/A;   Patient Active Problem List   Diagnosis Date  Noted   High risk medication use 09/04/2023   Primary insomnia 08/23/2022   Chronic fatigue 02/22/2022   Diabetes mellitus (HCC) 07/13/2021   Uncontrolled type 2 diabetes mellitus with hyperglycemia (HCC) 12/15/2020   Depression, recurrent (HCC) 12/15/2020   Coronary artery disease involving native coronary artery of native heart without angina pectoris 12/15/2020   History of non-ST elevation myocardial infarction (NSTEMI) 12/15/2020   Spasticity 08/03/2020   Spinal stenosis of lumbar region 08/03/2020   Lumbar radiculopathy 08/03/2020   Relapsing remitting multiple sclerosis (HCC) 05/02/2020   Hypertension associated with type 2 diabetes mellitus (HCC) 06/19/2019   Gait abnormality 07/10/2017   Degeneration of lumbar intervertebral disc 05/30/2017   Restless leg syndrome 07/27/2015   Hyperlipidemia associated with type 2 diabetes mellitus (HCC) 07/26/2015   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   NSTEMI (non-ST elevated myocardial infarction) (HCC)    S/P cholecystectomy 06/02/2013   IBS (irritable bowel syndrome) 06/02/2013   Multiple sclerosis (HCC) 09/02/2012   Abnormality of gait 09/02/2012   Morbid obesity (HCC) 08/06/2011   Carcinoma in situ of cervix uteri 08/06/2011    ONSET DATE: 20 years ago (initial diagnosis of MS)  REFERRING DIAG: Relapsing remitting multiple sclerosis, gait abnormality  THERAPY DIAG:  Unsteadiness on feet  Muscle weakness (generalized)  Rationale for Evaluation and Treatment: Rehabilitation  SUBJECTIVE:                                                                                                                                                                                             SUBJECTIVE STATEMENT: Patient reports that she feels good today.   Pt accompanied by: self  PERTINENT HISTORY: History of a MI, hypertension, type 2 diabetes, and depression  PAIN:  Are you having pain? No  PRECAUTIONS: None  RED FLAGS: None   WEIGHT BEARING  RESTRICTIONS: No  FALLS: Has patient fallen in last 6 months? Yes. Number of falls 2  LIVING ENVIRONMENT: Lives with: lives with their spouse Lives in: House/apartment Stairs: Yes: External: 1 steps; none Has following equipment at home: None  PLOF: Independent  PATIENT GOALS: improved strength and improved stability   OBJECTIVE:  Note: Objective measures were completed at Evaluation unless otherwise noted.  DIAGNOSTIC FINDINGS: 09/17/23 brain MRI IMPRESSION: Scan of the brain with and without contrast showing scattered bilateral supratentorial periventricular, subcortical and juxtacortical T2/FLAIR white matter hyperintensities in a pattern and distribution compatible with chronic demyelinating disease.  No enhancing lesions are noted.  No significant change compared with previous MRI from 08/17/2021.  COGNITION: Overall cognitive status: Within functional limits for tasks assessed   SENSATION: Patient reports numbness and tingling in both hands and feet, but it has been like this for approximately 18 years  MUSCLE TONE: [facility] for activities assessed  LOWER EXTREMITY ROM: WFL for activities assessed  UPPER EXTREMITY MMT:  MMT Right eval Left eval  Shoulder flexion 4/5 4/5  Shoulder extension    Shoulder abduction 4/5 4/5  Shoulder adduction    Shoulder extension    Shoulder internal rotation    Shoulder external rotation    Middle trapezius    Lower trapezius    Elbow flexion 5/5 5/5  Elbow extension 5/5 5/5  Wrist flexion    Wrist extension    Wrist ulnar deviation    Wrist radial deviation    Wrist pronation    Wrist supination    Grip strength 25 35   (Blank rows = not tested)   LOWER EXTREMITY MMT:    MMT Right Eval Left Eval  Hip flexion 3+/5 4-/5  Hip extension    Hip abduction    Hip adduction    Hip internal rotation    Hip external rotation    Knee flexion 4/5 4+/5  Knee extension 4/5 4+/5  Ankle dorsiflexion 4/5 4/5  Ankle plantarflexion     Ankle inversion    Ankle eversion    (Blank  rows = not tested)  TRANSFERS: Sit to stand: Complete Independence  Assistive device utilized: None     Stand to sit: Complete Independence  Assistive device utilized: None      BALANCE:  Narrow BOS, eyes open, firm surface: 30 seconds  Narrow BOS, eyes closed, firm surface: 30 seconds with increased sway  Tandem stance, eyes open, firm surface: 2 seconds each   GAIT: Findings: Gait Characteristics: step through pattern, decreased stride length, Right foot flat, Left foot flat, poor foot clearance- Right, and poor foot clearance- Left, Assistive device utilized:None, and Level of assistance: Complete Independence  FUNCTIONAL TESTS:  5 times sit to stand: 21.81 seconds without UE support Timed up and go (TUG): 15.69 seconds                                                                                       TREATMENT DATE:                                     10/03/23 EXERCISE LOG  Exercise Repetitions and Resistance Comments  Nustep  L3 x 15 minutes   Isometric ball press     Ball roll out     Eccentric heel tap     Rocker board  5 minutes   Side-stepping Airex x 4 mins   Tandem Gait Airex x 4 mins   LAQs 4# x 2.5 mins   Seated Ham Curls. Red x 25 reps bil    Blank cell = exercise not performed today                                    09/25/23 EXERCISE LOG  Exercise Repetitions and Resistance Comments  Nustep  L3 x 13 minutes   Rocker board  4 minutes   Static stance on foam  3 minutes  Without UE support   Lateral step up and over  4 step x 2 minutes  Intermittent UE support from parallel bars  LAQ  4# x 2.5 minutes Alternating LE  Seated hip ADD isometric  2 minutes w/ 5 second hold   Side stepping on foam  3 minutes  Intermittent UE support from parallel bars    Blank cell = exercise not performed today   PATIENT EDUCATION: Education details: prognosis, HEP, and following her physicians recommendations for her  medications Person educated: Patient Education method: Explanation Education comprehension: verbalized understanding  HOME EXERCISE PROGRAM:   GOALS: Goals reviewed with patient? Yes  SHORT TERM GOALS: Target date: 10/10/23  Patient will be independent with her initial HEP. Baseline: Goal status: INITIAL  2.  Patient will improve her 5 times sit to stand time to 15 seconds or less for improved lower extremity power. Baseline:  Goal status: INITIAL  3.  Patient will improve her right hip flexor strength to at least 4/5 for improved functional mobility.  Baseline:  Goal status: INITIAL  LONG TERM GOALS: Target date: 10/31/23  Patient will be independent with her advanced HEP.  Baseline:  Goal status: INITIAL  2.  Patient will improve her 5 times sit to stand time to 12 seconds or less to reduce her fall risk. Baseline:  Goal status: INITIAL  3.  Patient will improve her timed up and go time to 12 seconds or less to reduce her fall risk. Baseline:  Goal status: INITIAL  4.  Patient will improve her tandem balance to at least 5 seconds bilaterally for improved safety navigating with a narrow base of support. Baseline:  Goal status: INITIAL  ASSESSMENT:  CLINICAL IMPRESSION: Pt arrives for today's treatment session denying any pain.  Pt introduced to balance exercises on the Airex balance beam today with good results.  Pt encouraged to utilize as little UE support as possible while remaining safe and stable.  Pt challenged by tandem gait.  Pt introduced to seated ham curls with min cues for eccentric control required.  Pt denied any pain at completion of today's treatment session.  OBJECTIVE IMPAIRMENTS: Abnormal gait, decreased balance, decreased mobility, decreased strength, and pain.   ACTIVITY LIMITATIONS: standing and locomotion level  PARTICIPATION LIMITATIONS: community activity and yard work  PERSONAL FACTORS: Time since onset of injury/illness/exacerbation and 3+  comorbidities: History of a MI, hypertension, type 2 diabetes, and depression are also affecting patient's functional outcome.   REHAB POTENTIAL: Good  CLINICAL DECISION MAKING: Evolving/moderate complexity  EVALUATION COMPLEXITY: Moderate  PLAN:  PT FREQUENCY: 2x/week  PT DURATION: 6 weeks  PLANNED INTERVENTIONS: 97164- PT Re-evaluation, 97750- Physical Performance Testing, 97110-Therapeutic exercises, 97530- Therapeutic activity, 97112- Neuromuscular re-education, 97535- Self Care, 02859- Manual therapy, [phone]- Gait training, Patient/Family education, Balance training, Stair training, Cryotherapy, and Moist heat  PLAN FOR NEXT SESSION: Nustep, lower extremity strengthening, balance training, and gait training   Delon DELENA Gosling, PTA 10/03/2023, 5:37 PM

## 2023-10-07 ENCOUNTER — Encounter: Payer: Self-pay | Admitting: Family Medicine

## 2023-10-08 ENCOUNTER — Other Ambulatory Visit: Payer: Self-pay

## 2023-10-08 ENCOUNTER — Ambulatory Visit

## 2023-10-08 DIAGNOSIS — I872 Venous insufficiency (chronic) (peripheral): Secondary | ICD-10-CM

## 2023-10-08 DIAGNOSIS — R2681 Unsteadiness on feet: Secondary | ICD-10-CM | POA: Diagnosis not present

## 2023-10-08 DIAGNOSIS — M6281 Muscle weakness (generalized): Secondary | ICD-10-CM

## 2023-10-08 NOTE — Therapy (Signed)
 OUTPATIENT PHYSICAL THERAPY NEURO TREATMENT   Patient Name: Kaylee Bryant MRN: [id number] DOB:[birth year], 58 y.o., female Today's Date: 10/08/2023   PCP: Severa Rock HERO, FNP REFERRING PROVIDER: Onita Duos, MD  END OF SESSION:  PT End of Session - 10/08/23 0807     Visit Number 5    Number of Visits 12    Date for PT Re-Evaluation 11/22/23    PT Start Time 0800    PT Stop Time 0842    PT Time Calculation (min) 42 min    Activity Tolerance Patient tolerated treatment well    Behavior During Therapy [facility] for tasks assessed/performed             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 (HCC) 04/05/2015   DDD (degenerative disc disease), lumbar    DM (diabetes mellitus), gestational 2006   insulin  at the time   Fatigue    Heart attack (HCC)    IBS (irritable bowel syndrome)    with chronic constipation   Multiple sclerosis (HCC)    Obesity    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   LIMA to LAD off pump   Past Surgical History:  Procedure Laterality Date   CARDIAC CATHETERIZATION N/A 04/05/2015   Procedure: Left Heart Cath and Coronary Angiography;  Surgeon: Debby DELENA Sor, MD;  Location: [facility] INVASIVE CV LAB;  Service: Cardiovascular;  Laterality: N/A;   CERVICAL CONE BIOPSY  2004   CESAREAN SECTION     1996, 2006   CHOLECYSTECTOMY     1988   COLPOSCOPY  2004   CORONARY ARTERY BYPASS GRAFT N/A 04/07/2015   Procedure: CORONARY ARTERY BYPASS GRAFTING (CABG), OFF PUMP, TIMES ONE, USING LEFT INTERNAL MAMMARY ARTERY;  Surgeon: Sudie VEAR Laine, MD;  Location: [facility] OR;  Service: Open Heart Surgery;  Laterality: N/A;  LIMA to LAD   TEE WITHOUT CARDIOVERSION N/A 04/07/2015   Procedure: TRANSESOPHAGEAL ECHOCARDIOGRAM (TEE);  Surgeon: Sudie VEAR Laine, MD;  Location: [facility] OR;  Service: Open Heart Surgery;  Laterality: N/A;   Patient Active Problem List   Diagnosis Date  Noted   High risk medication use 09/04/2023   Primary insomnia 08/23/2022   Chronic fatigue 02/22/2022   Diabetes mellitus (HCC) 07/13/2021   Uncontrolled type 2 diabetes mellitus with hyperglycemia (HCC) 12/15/2020   Depression, recurrent (HCC) 12/15/2020   Coronary artery disease involving native coronary artery of native heart without angina pectoris 12/15/2020   History of non-ST elevation myocardial infarction (NSTEMI) 12/15/2020   Spasticity 08/03/2020   Spinal stenosis of lumbar region 08/03/2020   Lumbar radiculopathy 08/03/2020   Relapsing remitting multiple sclerosis (HCC) 05/02/2020   Hypertension associated with type 2 diabetes mellitus (HCC) 06/19/2019   Gait abnormality 07/10/2017   Degeneration of lumbar intervertebral disc 05/30/2017   Restless leg syndrome 07/27/2015   Hyperlipidemia associated with type 2 diabetes mellitus (HCC) 07/26/2015   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   NSTEMI (non-ST elevated myocardial infarction) (HCC)    S/P cholecystectomy 06/02/2013   IBS (irritable bowel syndrome) 06/02/2013   Multiple sclerosis (HCC) 09/02/2012   Abnormality of gait 09/02/2012   Morbid obesity (HCC) 08/06/2011   Carcinoma in situ of cervix uteri 08/06/2011    ONSET DATE: 20 years ago (initial diagnosis of MS)  REFERRING DIAG: Relapsing remitting multiple sclerosis, gait abnormality  THERAPY DIAG:  Unsteadiness on feet  Muscle weakness (  generalized)  Rationale for Evaluation and Treatment: Rehabilitation  SUBJECTIVE:                                                                                                                                                                                             SUBJECTIVE STATEMENT: Patient reports that she feels good today and she has not had any problems since her last appointment.   Pt accompanied by: self  PERTINENT HISTORY: History of a MI, hypertension, type 2 diabetes, and depression  PAIN:  Are you having pain?  No  PRECAUTIONS: None  RED FLAGS: None   WEIGHT BEARING RESTRICTIONS: No  FALLS: Has patient fallen in last 6 months? Yes. Number of falls 2  LIVING ENVIRONMENT: Lives with: lives with their spouse Lives in: House/apartment Stairs: Yes: External: 1 steps; none Has following equipment at home: None  PLOF: Independent  PATIENT GOALS: improved strength and improved stability   OBJECTIVE:  Note: Objective measures were completed at Evaluation unless otherwise noted.  DIAGNOSTIC FINDINGS: 09/17/23 brain MRI IMPRESSION: Scan of the brain with and without contrast showing scattered bilateral supratentorial periventricular, subcortical and juxtacortical T2/FLAIR white matter hyperintensities in a pattern and distribution compatible with chronic demyelinating disease.  No enhancing lesions are noted.  No significant change compared with previous MRI from 08/17/2021.  COGNITION: Overall cognitive status: Within functional limits for tasks assessed   SENSATION: Patient reports numbness and tingling in both hands and feet, but it has been like this for approximately 18 years  MUSCLE TONE: [facility] for activities assessed  LOWER EXTREMITY ROM: WFL for activities assessed  UPPER EXTREMITY MMT:  MMT Right eval Left eval  Shoulder flexion 4/5 4/5  Shoulder extension    Shoulder abduction 4/5 4/5  Shoulder adduction    Shoulder extension    Shoulder internal rotation    Shoulder external rotation    Middle trapezius    Lower trapezius    Elbow flexion 5/5 5/5  Elbow extension 5/5 5/5  Wrist flexion    Wrist extension    Wrist ulnar deviation    Wrist radial deviation    Wrist pronation    Wrist supination    Grip strength 25 35   (Blank rows = not tested)   LOWER EXTREMITY MMT:    MMT Right Eval Left Eval  Hip flexion 3+/5 4-/5  Hip extension    Hip abduction    Hip adduction    Hip internal rotation    Hip external rotation    Knee flexion 4/5 4+/5  Knee extension  4/5 4+/5  Ankle dorsiflexion 4/5 4/5  Ankle plantarflexion  Ankle inversion    Ankle eversion    (Blank rows = not tested)  TRANSFERS: Sit to stand: Complete Independence  Assistive device utilized: None     Stand to sit: Complete Independence  Assistive device utilized: None      BALANCE:  Narrow BOS, eyes open, firm surface: 30 seconds  Narrow BOS, eyes closed, firm surface: 30 seconds with increased sway  Tandem stance, eyes open, firm surface: 2 seconds each   GAIT: Findings: Gait Characteristics: step through pattern, decreased stride length, Right foot flat, Left foot flat, poor foot clearance- Right, and poor foot clearance- Left, Assistive device utilized:None, and Level of assistance: Complete Independence  FUNCTIONAL TESTS:  5 times sit to stand: 21.81 seconds without UE support Timed up and go (TUG): 15.69 seconds                                                                                       TREATMENT DATE:                                     10/08/23 EXERCISE LOG  Exercise Repetitions and Resistance Comments  Nustep  L4 x 15 minutes   Rocker board  1.5 minutes each  AP and lateral   Narrow BOS on foam  2 minutes  Without UE support   Seated clams  Green t-band x 3 minutes    Cone taps  6 laps each  4 cones   Lateral step up and over  6 step x 10 reps     Blank cell = exercise not performed today                                    10/03/23 EXERCISE LOG  Exercise Repetitions and Resistance Comments  Nustep  L3 x 15 minutes   Isometric ball press     Ball roll out     Eccentric heel tap     Rocker board  5 minutes   Side-stepping Airex x 4 mins   Tandem Gait Airex x 4 mins   LAQs 4# x 2.5 mins   Seated Ham Curls. Red x 25 reps bil    Blank cell = exercise not performed today                                    09/25/23 EXERCISE LOG  Exercise Repetitions and Resistance Comments  Nustep  L3 x 13 minutes   Rocker board  4 minutes   Static stance on  foam  3 minutes  Without UE support   Lateral step up and over  4 step x 2 minutes  Intermittent UE support from parallel bars  LAQ  4# x 2.5 minutes Alternating LE  Seated hip ADD isometric  2 minutes w/ 5 second hold   Side stepping on foam  3 minutes  Intermittent UE support from parallel bars    Blank  cell = exercise not performed today   PATIENT EDUCATION: Education details: shoes Person educated: Patient Education method: Explanation Education comprehension: verbalized understanding  HOME EXERCISE PROGRAM:   GOALS: Goals reviewed with patient? Yes  SHORT TERM GOALS: Target date: 10/10/23  Patient will be independent with her initial HEP. Baseline: Goal status: INITIAL  2.  Patient will improve her 5 times sit to stand time to 15 seconds or less for improved lower extremity power. Baseline:  Goal status: INITIAL  3.  Patient will improve her right hip flexor strength to at least 4/5 for improved functional mobility.  Baseline:  Goal status: INITIAL  LONG TERM GOALS: Target date: 10/31/23  Patient will be independent with her advanced HEP. Baseline:  Goal status: INITIAL  2.  Patient will improve her 5 times sit to stand time to 12 seconds or less to reduce her fall risk. Baseline:  Goal status: INITIAL  3.  Patient will improve her timed up and go time to 12 seconds or less to reduce her fall risk. Baseline:  Goal status: INITIAL  4.  Patient will improve her tandem balance to at least 5 seconds bilaterally for improved safety navigating with a narrow base of support. Baseline:  Goal status: INITIAL  ASSESSMENT:  CLINICAL IMPRESSION: Patient was progressed with cone taps and lateral step ups for improved dynamic stability and functional mobility. She required minimal cueing with cone taps for upright stance to improve her awareness of her surroundings. She experienced a mild increase in fatigue with today's interventions, but this did not limit her ability to  complete any of today's interventions. She reported feeling good upon the conclusion of treatment. She continues to require skilled physical therapy to address her remaining impairments to maximize her safety and functional mobility.   OBJECTIVE IMPAIRMENTS: Abnormal gait, decreased balance, decreased mobility, decreased strength, and pain.   ACTIVITY LIMITATIONS: standing and locomotion level  PARTICIPATION LIMITATIONS: community activity and yard work  PERSONAL FACTORS: Time since onset of injury/illness/exacerbation and 3+ comorbidities: History of a MI, hypertension, type 2 diabetes, and depression are also affecting patient's functional outcome.   REHAB POTENTIAL: Good  CLINICAL DECISION MAKING: Evolving/moderate complexity  EVALUATION COMPLEXITY: Moderate  PLAN:  PT FREQUENCY: 2x/week  PT DURATION: 6 weeks  PLANNED INTERVENTIONS: 97164- PT Re-evaluation, 97750- Physical Performance Testing, 97110-Therapeutic exercises, 97530- Therapeutic activity, 97112- Neuromuscular re-education, 97535- Self Care, 02859- Manual therapy, [phone]- Gait training, Patient/Family education, Balance training, Stair training, Cryotherapy, and Moist heat  PLAN FOR NEXT SESSION: Nustep, lower extremity strengthening, balance training, and gait training   Lacinda JAYSON Fass, PT 10/08/2023, 9:54 AM

## 2023-10-10 ENCOUNTER — Encounter

## 2023-10-11 ENCOUNTER — Encounter: Admitting: *Deleted

## 2023-10-16 ENCOUNTER — Encounter: Admitting: Physical Therapy

## 2023-10-17 ENCOUNTER — Ambulatory Visit: Payer: Self-pay | Admitting: Vascular Surgery

## 2023-10-17 ENCOUNTER — Ambulatory Visit (HOSPITAL_COMMUNITY)

## 2023-10-28 ENCOUNTER — Encounter: Payer: Self-pay | Admitting: Neurology

## 2023-10-29 NOTE — Telephone Encounter (Signed)
 Ok to add her on my schedule as virtual or in person

## 2023-10-31 ENCOUNTER — Encounter: Payer: Self-pay | Admitting: Neurology

## 2023-10-31 ENCOUNTER — Ambulatory Visit (INDEPENDENT_AMBULATORY_CARE_PROVIDER_SITE_OTHER): Admitting: Neurology

## 2023-10-31 ENCOUNTER — Telehealth: Payer: Self-pay | Admitting: Pharmacist

## 2023-10-31 VITALS — BP 120/77 | HR 64 | Ht 63.0 in | Wt 178.5 lb

## 2023-10-31 DIAGNOSIS — R269 Unspecified abnormalities of gait and mobility: Secondary | ICD-10-CM

## 2023-10-31 DIAGNOSIS — G35 Multiple sclerosis: Secondary | ICD-10-CM | POA: Diagnosis not present

## 2023-10-31 DIAGNOSIS — M48061 Spinal stenosis, lumbar region without neurogenic claudication: Secondary | ICD-10-CM | POA: Diagnosis not present

## 2023-10-31 MED ORDER — TRAMADOL HCL 50 MG PO TABS: 50.0000 mg | ORAL_TABLET | Freq: Four times a day (QID) | ORAL | 3 refills | Status: AC | PRN

## 2023-10-31 MED ORDER — CELECOXIB 50 MG PO CAPS: 50.0000 mg | ORAL_CAPSULE | Freq: Two times a day (BID) | ORAL | 5 refills | Status: DC | PRN

## 2023-10-31 MED ORDER — METHYLPREDNISOLONE 4 MG PO TBPK
ORAL_TABLET | ORAL | 0 refills | Status: DC
Start: 2023-10-31 — End: 2024-01-02

## 2023-10-31 NOTE — Progress Notes (Signed)
 Chief Complaint  Patient presents with   Multiple Sclerosis    Room 15 Pt is alone MS- pt having a lot spasms in her legs , the right worse than the left  daily , she her movement is slow and she feels sluggish    ASSESSMENT AND PLAN  Relapsing remitting multiple sclerosis Gait abnormality  MS was diagnosed since 2007, 6 months postpartum,  MRI of the brain was abnormal, also had evidence of cervical and thoracic spine enhancing lesions,  Ribif from March 2007-2013  Tysarbri from Feb 2013-2021  Start Ocrelizumab  infusion since January 2021, IgM was significantly decreased on repeat laboratory evaluation level was 10 in September 2024 with well-preserved IgG level, last infusion was on Jan 24 2024, planning on extended infusion every 8 months due to decreased IgM level, she does concern longer chair time, fatigue, especially before next infusion, agree switch to Briumvi    Lumbar stenosis, worsening lower extremity spasticity  She has such severe predictable frequent lower extremity muscle spasm, pain, to the point of frequent falling, affecting her job performance  MRI of lumbar spine in March 2022, L 4 5, pseudodisc bulging, facet hypertrophy, right posterior bone spurring resulting severe spinal stenosis,  Was offered surgery in the past, she deferred, with her worsening symptoms likely due to her lumbar stenosis repeat MRI of lumbar  Tramadol  as needed, Celebrex  as needed  Will also give her Medrol  pack,       DIAGNOSTIC DATA (LABS, IMAGING, TESTING) - I reviewed patient records, labs, notes, testing and imaging myself where available. MRI of the brain with without contrast in June 2025 scattered bilateral supratentorial periventricular, subcortical and juxtacortical T2/FLAIR white matter hyperintensities in a pattern and distribution compatible with chronic demyelinating disease. No enhancing lesions are noted. No significant change compared with previous MRI from 08/17/2021.    MRI thoracic spine (with and without) demonstrating: November 2020 - Chronic demyelinating plaques at C7,T6-7, T9, T10, T12-L1. - No acute plaques.  MRI cervical spine (with and without) demonstrating: November 2020 - Chronic demyelinating plaques at C2, C3, C4, and C6-7 spinal cord; also noted in the brainstem.   JC virus titer was negative  HISTORY OF PRESENT ILLNESS: She was diagnosed with RRMS in 2007, 6 month post partum, she had numbness from waist done, diagnosis was confirmed by marked abnormal MRI brain, in March,2007. There were multiple T2/Flair periventricular lesions, corpus callosum involvement, mutiple ring enhancing lesions. Also had enhancing lesion in cervical and thoracic spines. She was treated at [facility] by Dr. Reyes, no csf study, but had extensive lab evaluations to rule out mimics. 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 She denies visual changes or other clear relapsing episodes.    MRI brain  in 01/2007, multiple T2/FLAIR lesions in both hemisphere with corpus callosum involvement, there are slight increase in lesion load. MRI cervical, stable lesions in C6-7, and C2-3. Thoracic, multiple non enhancing lesions.    She tolerated Rebif well, no flu like illness, but complains of bilateral arm, and the lateral thigh lipoatrophy with repeat Rebif injection. There was no clear relapsed over the years, but she has slow worsening gait difficulty over the past 6 months.  MRI in Sept 2012, MRI of thoracic spine Multiple chronic plaques at C7, T6-7,  T8-9, T10.   MRI of cervical spine ,Multiple chronic demyelinating plaques at C3, C4 and C7. MRI of brain Multiple round and ovioid, periventricular and subcortical chronic demyelinating plaques. 2 plaques  noted in the pons. No abnormal lesions are seen on post contrast views.  Her JC-virus antibody was negative. After discuss, we started her on Tysarbri infusion since February of 2013. She tolerated the infusion very well, but complains of excessive fatigue over the past few months, she still able to work full-time as a Engineer, civil (consulting), she complains of difficulty dragging herself out of bed. Modafinil was not approved by insurance.She was put on Ritalin  10mg  tid, which helped her fatigue. feels much better on Tysabri .   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, with short staffed, she has to work extra hours, does complains of fatigue, on polypharmacy treatment, including Effexor  XR 75 mg daily, baclofen  10 mg 2 tablets 3 times a day, tizanidine  2 mg 3 times a day, also on gabapentin  600 mg 3 times a day   Recently she has intermittent sharp radiating pain at right face from the right forehead to break in the eye corner, to below cheek area, gabapentin  provide limited help,   Last MRI of the brain was in November 2020, personally reviewed the films, MRI of the brain multiple round and oval periventricular, subcortical, pontine chronic demyelinating plaques, no acute abnormality, no change from MRI compared with January 2019, MRI of cervical and thoracic spine showed chronic demyelinating plaque at C2, C3, 4, and C6- C7, T6-7, T9, T10, T12 and L1, no enhancement  Update February 22, 2022: She is overall well, weight loss program, continue to work full-time as a Engineer, 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, low back pain has improved, but still have intermittent radiating pain to left lower extremity  She complains of fatigue, lack of stamina,  do have excessive sleepiness, loud snoring,  A1c is 6.7, vitamin D  39.6, IgM was 9, normal IgG 780, CPK 194, negative HIV, hepatitis C,  Personally reviewed MRI of lumbar spine in March 2022, severe spinal stenosis L4-5 level due to pseudo disc bulging, facet hypertrophy right posterior lateral bone spurring,  MRI of the brain with without contrast in May 2023, stable MS lesions involving periventricular, subcortical, no contrast-enhancement  UPDATE Dec 16th 2024: She is overall doing very well, no flareup of MS, tolerating ocrelizumab  infusion, last infusion was January 24, 2023, next due in May 2025, continue to work as a Engineer, civil (consulting),  Laboratory in September 2024 normal vitamin D , B12, IgM was decreased  10, with well-preserved IgG 835  UPDATE September 04 2023: Last ocrelizumab  Infusion was on Jan 24 2023, her IgM was low, intent to extend every 6 months infusion to every 8 months, she did notice mild increased gait abnormality, also concerned about the long chair time, increased the fatigue at the end of infusion cycle, would like to try physical therapy and also switch Briumvi, last MRI of the brain was in 2023, stable lesion  Recent few months, at evening time, after relaxing from work, complains of leg jumping and urge to move,  UPDATE August 7th 2025: She complains of worsening lower extremity muscle spasm, it can be so bad persistent unpredictable, it is  affecting her job performance as a Engineer, civil (consulting), her balance is off, fell few times, also complains of difficulty defecate, occasional urinary incontinence episode  I think her above complaint is most related to her lumbar stenosis, MRI in March 2022 showed severe spinal stenosis L4-5, L5 -S1disc bulging, facet hypertrophy with moderate right, severe left foraminal stenosis     REVIEW OF SYSTEMS: Out of a complete 14 system review of symptoms, the patient complains only of the following symptoms, and all other reviewed systems are  negative.  PHYSICAL EXAM: Today's Vitals   10/31/23 1003  BP: 120/77  Pulse: 64  SpO2: 97%  Weight: 178 lb 8 oz (81 kg)  Height: 5' 3 (1.6 m)   Body mass index is 31.62 kg/m.   PHYSICAL EXAMNIATION:  Gen: NAD, conversant, well nourised, well groomed                     Cardiovascular: Regular rate rhythm, no peripheral edema, warm, nontender. Eyes: Conjunctivae clear without exudates or hemorrhage Neck: Supple, no carotid bruits. Pulmonary: Clear to auscultation bilaterally   NEUROLOGICAL EXAM:  MENTAL STATUS: Speech/cognition: Awake, alert oriented to history taking and casual conversation  CRANIAL NERVES: CN II: Visual fields are full to confrontation.  Pupils are round equal and briskly reactive to light. CN III, IV, VI: extraocular movement are normal. No ptosis. CN V: Facial sensation is intact to pinprick in all 3 divisions bilaterally. Corneal responses are intact.  CN VII: Face is symmetric with normal eye closure and smile. CN VIII: Hearing is normal to casual conversation CN IX, X: Palate elevates symmetrically. Phonation is normal. CN XI: Head turning and shoulder shrug are intact CN XII: Tongue is midline with normal movements and no atrophy.  MOTOR: Mild right toe extension weakness, moderate lower extremity spasticity  REFLEXES: Reflexes are 1 and symmetric at the biceps, triceps, absent at knees, and ankles. Plantar responses are flexor.  SENSORY: Mildly length-dependent decreased light touch, pinprick, vibratory sensation  COORDINATION: Rapid alternating movements and fine finger movements are intact. There is no dysmetria on finger-to-nose and heel-knee-shin.    GAIT/STANCE: Need push up, stiff, cautious,   unsteady  ALLERGIES: No Known Allergies   HOME MEDICATIONS: Outpatient Medications Prior to Visit  Medication Sig Dispense Refill   acetaminophen  (TYLENOL ) 500 MG tablet Take 1,000 mg by mouth every 6 (six) hours as needed for mild pain.      aspirin  EC 81 MG tablet Take 81 mg by mouth daily.     atorvastatin  (LIPITOR ) 80 MG tablet TAKE 1 TABLET BY MOUTH DAILY AT 6 PM. 30 tablet 11   baclofen  (LIORESAL ) 10 MG tablet TAKE 2 TABLETS BY MOUTH 3 TIMES A DAY 540 tablet 3   gabapentin  (NEURONTIN ) 300 MG capsule Take 2 capsules (600 mg total) by mouth 4 (four) times daily. 720 capsule 3   lisinopril  (ZESTRIL ) 10 MG tablet TAKE 1 TABLET BY MOUTH TWICE A DAY 60 tablet 10   metoprolol  tartrate (LOPRESSOR ) 25 MG tablet TAKE 1 TABLET BY MOUTH TWICE A DAY 60 tablet 11   ocrelizumab  (OCREVUS ) 300 MG/10ML injection Inject 20 mLs (600 mg total) into the vein every 6 (six) months. 20 mL 0   rOPINIRole  (REQUIP ) 0.5 MG tablet Take 2 tablets (1 mg total) by mouth 3 (three) times daily. 60 tablet 11   tirzepatide  (MOUNJARO ) 7.5 MG/0.5ML Pen Inject 7.5 mg into the skin once a week. 6 mL 3   traZODone  (DESYREL ) 50 MG tablet Take  1 tablet (50 mg total) by mouth at bedtime. 90 tablet 3   venlafaxine  XR (EFFEXOR -XR) 150 MG 24 hr capsule Take 1 capsule (150 mg total) by mouth daily with breakfast. 90 capsule 3   venlafaxine  XR (EFFEXOR -XR) 75 MG 24 hr capsule Take 1 capsule (75 mg total) by mouth daily with breakfast. 90 capsule 3   No facility-administered medications prior to visit.     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   LIMA to LAD off pump     PAST SURGICAL HISTORY: Past Surgical History:  Procedure Laterality Date   CARDIAC CATHETERIZATION N/A 04/05/2015   Procedure: Left Heart Cath and Coronary  Angiography;  Surgeon: Debby DELENA Sor, MD;  Location: [facility];  Service: Cardiovascular;  Laterality: N/A;   CERVICAL CONE BIOPSY  2004   CESAREAN SECTION     1996, 2006   CHOLECYSTECTOMY     1988   COLPOSCOPY  2004   CORONARY ARTERY BYPASS GRAFT N/A 04/07/2015   Procedure: CORONARY ARTERY BYPASS GRAFTING (CABG), OFF PUMP, TIMES ONE, USING LEFT INTERNAL MAMMARY ARTERY;  Surgeon: Sudie VEAR Laine, MD;  Location: [facility] OR;  Service: Open Heart Surgery;  Laterality: N/A;  LIMA to LAD   TEE WITHOUT CARDIOVERSION N/A 04/07/2015   Procedure: TRANSESOPHAGEAL ECHOCARDIOGRAM (TEE);  Surgeon: Sudie VEAR Laine, MD;  Location[facility];  Service: Open Heart Surgery;  Laterality: N/A;     FAMILY HISTORY: Family History  Problem Relation Age of Onset   Diabetes Mother    Hypertension Mother    Hypertension Father    Atrial fibrillation Father    Diabetes Sister    Breast cancer Maternal Aunt    Arrhythmia Sister    Heart attack Maternal Grandfather    Stroke Neg Hx      SOCIAL HISTORY: Social History   Socioeconomic History   Marital status: Married    Spouse name: Not on file   Number of children: 4   Years of education: college   Highest education level: Not on file  Occupational History   Occupation: Teacher, adult education: [employer]  Tobacco Use   Smoking status: Former    Current packs/day: 0.00    Average packs/day: 1.5 packs/day for 15.0 years (22.5 ttl pk-yrs)    Types: Cigarettes    Start date: 02/02/1989    Quit date: 02/03/2004    Years since quitting: 19.7   Smokeless tobacco: Never  Substance and Sexual Activity   Alcohol use: Yes    Alcohol/week: 1.0 standard drink of alcohol    Types: 1 Glasses of wine per week   Drug use: No   Sexual activity: Yes    Partners: Male    Birth control/protection: Surgical    Comment: vasectomy  Other Topics Concern   Not on file  Social History Narrative   Patient lives at home with her husband and children.  Patient works for the [employer] in [place].  As a Engineer, civil (consulting). College education.    Social Drivers of Corporate investment banker Strain: Not on file  Food Insecurity: Not on file  Transportation Needs: Not on file  Physical Activity: Not on file  Stress: Not on file  Social Connections: Not on file  Intimate Partner Violence: Not on file     Modena Callander, M.D. Ph.D.  [facility] [address] [place], [place] [address] Phone: [phone] Fax:      [phone]

## 2023-10-31 NOTE — Telephone Encounter (Signed)
 Trulicity 1.5mg  weekly samples (#28 days) given to bridge patient to [employer]  insurance coverage (anticipates coverage in October) Unable to get Moun[employer]o  samples at this time

## 2023-11-19 ENCOUNTER — Telehealth: Payer: Self-pay | Admitting: Neurology

## 2023-11-19 NOTE — Telephone Encounter (Signed)
 I talked to her on 8/14 to schedule her MRI and she said she wanted to talk to her insurance first then call me back.

## 2023-12-11 ENCOUNTER — Telehealth: Payer: Self-pay

## 2023-12-11 NOTE — Telephone Encounter (Signed)
 I spoke w/Holly at [facility] who stated that they were trying to get her approved for briumvi when they were notified that the pt switched insuranced and will only cover specialty pharmacy. I am routing to provider to see how they wish to proceed?

## 2023-12-11 NOTE — Telephone Encounter (Addendum)
 Ok to proceed with Briumvi with her new insurance,

## 2023-12-12 NOTE — Telephone Encounter (Signed)
 You could refer pt to [facility]

## 2023-12-12 NOTE — Telephone Encounter (Signed)
 [facility] FORM FILLED OUT AND AWAITING PROVIDER SIGNATURE

## 2023-12-13 ENCOUNTER — Telehealth: Payer: Self-pay | Admitting: Family Medicine

## 2023-12-13 NOTE — Telephone Encounter (Signed)
 Pt just walked into lobby & she would like to get some Trulicity samples in place of [employer]  until she gets Ins thru her new job. Rakes went to check in refrigerators to see, if we had any. We did not have any! She ask me to put a note in pt's chart to check w/Julie when she returns. Please call pt.

## 2023-12-30 ENCOUNTER — Telehealth: Payer: Self-pay

## 2023-12-30 NOTE — Telephone Encounter (Signed)
 Call to [facility] with [facility], she advised that due to insurance they weren't able to keep patient. She stated that [facility] [phone]) was able to and she could fax orders to them. She stated they would send their own orders for us  to sign and fax back for infusion and nursing.

## 2024-01-02 ENCOUNTER — Ambulatory Visit: Admitting: Family Medicine

## 2024-01-02 ENCOUNTER — Encounter: Payer: Self-pay | Admitting: Family Medicine

## 2024-01-02 VITALS — BP 130/80 | HR 63 | Temp 97.6°F | Ht 63.0 in | Wt 180.6 lb

## 2024-01-02 DIAGNOSIS — E1169 Type 2 diabetes mellitus with other specified complication: Secondary | ICD-10-CM

## 2024-01-02 DIAGNOSIS — Z23 Encounter for immunization: Secondary | ICD-10-CM | POA: Diagnosis not present

## 2024-01-02 DIAGNOSIS — I2510 Atherosclerotic heart disease of native coronary artery without angina pectoris: Secondary | ICD-10-CM

## 2024-01-02 DIAGNOSIS — E1159 Type 2 diabetes mellitus with other circulatory complications: Secondary | ICD-10-CM | POA: Diagnosis not present

## 2024-01-02 DIAGNOSIS — I252 Old myocardial infarction: Secondary | ICD-10-CM

## 2024-01-02 DIAGNOSIS — Z7985 Long-term (current) use of injectable non-insulin antidiabetic drugs: Secondary | ICD-10-CM | POA: Diagnosis not present

## 2024-01-02 DIAGNOSIS — Z1231 Encounter for screening mammogram for malignant neoplasm of breast: Secondary | ICD-10-CM

## 2024-01-02 DIAGNOSIS — E785 Hyperlipidemia, unspecified: Secondary | ICD-10-CM

## 2024-01-02 DIAGNOSIS — I152 Hypertension secondary to endocrine disorders: Secondary | ICD-10-CM

## 2024-01-02 DIAGNOSIS — G35D Multiple sclerosis, unspecified: Secondary | ICD-10-CM

## 2024-01-02 LAB — BAYER DCA HB A1C WAIVED: HB A1C (BAYER DCA - WAIVED): 8.2 % — ABNORMAL HIGH (ref 4.8–5.6)

## 2024-01-02 MED ORDER — TIRZEPATIDE 7.5 MG/0.5ML ~~LOC~~ SOAJ: 7.5000 mg | SUBCUTANEOUS | 3 refills | Status: DC

## 2024-01-02 NOTE — Patient Instructions (Addendum)

## 2024-01-02 NOTE — Progress Notes (Signed)
 Subjective:  Patient ID: Kaylee Bryant, female    DOB: 1965-11-23, 58 y.o.   MRN: [id number]  Patient Care Team: Severa Rock HERO, FNP as PCP - General (Family Medicine)   Chief Complaint:  Medical Management of Chronic Issues (3 month chronic follow up )   HPI: Kaylee Bryant is a 58 y.o. female presenting on 01/02/2024 for Medical Management of Chronic Issues (3 month chronic follow up )   Kaylee Bryant is a 58 year old female with diabetes who presents for medication management and follow-up.  She has been off her diabetes medication for two weeks and has not experienced any symptoms. Her last recorded A1c was 6.8, but she has not been checking her blood sugars regularly. She wants to start monitoring her blood sugar levels again.  She reports increased urination and thirst. She is currently trying to resolve insurance issues to ensure coverage for her medications. Her husband's insurance covers most of her medications, but there are challenges with coverage for specific medications like tirzepatide .  She works 12-hour shifts and is transitioning to a third shift in November, which she believes will be more manageable. Her work is physically demanding, involving long hours of standing and extensive tasks that were not part of her previous roles. She is looking forward to retirement.  She recently visited an eye doctor and was informed that her vision has been corrected to 20/20, although she has early cataracts that may require attention in 5 to 10 years. She experiences star-like effects from headlights at night, which she attributes to the cataracts.          Relevant past medical, surgical, family, and social history reviewed and updated as indicated.  Allergies and medications reviewed and updated. Data reviewed: Chart in Epic.   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   LIMA to LAD off pump    Past Surgical History:  Procedure Laterality Date   CARDIAC CATHETERIZATION N/A 04/05/2015   Procedure: Left Heart Cath and Coronary Angiography;  Surgeon: Debby DELENA Sor, MD;  Location: [facility] INVASIVE CV LAB;  Service: Cardiovascular;  Laterality: N/A;   CERVICAL CONE BIOPSY  2004   CESAREAN SECTION     1996, 2006   CHOLECYSTECTOMY     1988   COLPOSCOPY  2004   CORONARY ARTERY BYPASS GRAFT N/A 04/07/2015   Procedure: CORONARY ARTERY BYPASS GRAFTING (CABG), OFF PUMP, TIMES ONE, USING LEFT INTERNAL MAMMARY ARTERY;  Surgeon: Sudie VEAR Laine, MD;  Location: [facility] OR;  Service: Open Heart Surgery;  Laterality: N/A;  LIMA to LAD   TEE WITHOUT CARDIOVERSION N/A 04/07/2015   Procedure: TRANSESOPHAGEAL ECHOCARDIOGRAM (TEE);  Surgeon: Sudie VEAR Laine, MD;  Location[facility];  Service: Open Heart Surgery;  Laterality: N/A;    Social History   Socioeconomic History   Marital status: Married    Spouse name: Not on file   Number of children: 4   Years of education: college   Highest education level:  Not on file  Occupational History   Occupation: Teacher, adult education: [employer]  Tobacco Use   Smoking status: Former    Current packs/day: 0.00    Average packs/day: 1.5 packs/day for 15.0 years (22.5 ttl pk-yrs)    Types: Cigarettes    Start date: 02/02/1989    Quit date: 02/03/2004    Years since quitting: 19.9   Smokeless tobacco: Never  Substance and Sexual Activity   Alcohol use: Yes    Alcohol/week: 1.0 standard drink of alcohol    Types: 1 Glasses of wine per week   Drug use: No   Sexual activity: Yes    Partners: Male    Birth control/protection:  Surgical    Comment: vasectomy  Other Topics Concern   Not on file  Social History Narrative   Patient lives at home with her husband and children. Patient works for the [employer] in [employer]. As a Engineer, civil (consulting). College education.    Social Drivers of Corporate investment banker Strain: Not on file  Food Insecurity: Not on file  Transportation Needs: Not on file  Physical Activity: Not on file  Stress: Not on file  Social Connections: Not on file  Intimate Partner Violence: Not on file    Outpatient Encounter Medications as of 01/02/2024  Medication Sig   acetaminophen  (TYLENOL ) 500 MG tablet Take 1,000 mg by mouth every 6 (six) hours as needed for mild pain.   aspirin  EC 81 MG tablet Take 81 mg by mouth daily.   atorvastatin  (LIPITOR ) 80 MG tablet TAKE 1 TABLET BY MOUTH DAILY AT 6 PM.   baclofen  (LIORESAL ) 10 MG tablet TAKE 2 TABLETS BY MOUTH 3 TIMES A DAY   celecoxib  (CELEBREX ) 50 MG capsule Take 1 capsule (50 mg total) by mouth 2 (two) times daily as needed for pain.   gabapentin  (NEURONTIN ) 300 MG capsule Take 2 capsules (600 mg total) by mouth 4 (four) times daily.   lisinopril  (ZESTRIL ) 10 MG tablet TAKE 1 TABLET BY MOUTH TWICE A DAY   metoprolol  tartrate (LOPRESSOR ) 25 MG tablet TAKE 1 TABLET BY MOUTH TWICE A DAY   traMADol  (ULTRAM ) 50 MG tablet Take 1 tablet (50 mg total) by mouth every 6 (six) hours as needed.   traZODone  (DESYREL ) 50 MG tablet Take 1 tablet (50 mg total) by mouth at bedtime.   venlafaxine  XR (EFFEXOR -XR) 150 MG 24 hr capsule Take 1 capsule (150 mg total) by mouth daily with breakfast.   venlafaxine  XR (EFFEXOR -XR) 75 MG 24 hr capsule Take 1 capsule (75 mg total) by mouth daily with breakfast.   [DISCONTINUED] tirzepatide  (MOUNJARO ) 7.5 MG/0.5ML Pen Inject 7.5 mg into the skin once a week.   tirzepatide  (MOUNJARO ) 7.5 MG/0.5ML Pen Inject 7.5 mg into the skin once a week.   [DISCONTINUED] methylPREDNISolone  (MEDROL  DOSEPAK) 4 MG TBPK tablet Take as instructed    [DISCONTINUED] ocrelizumab  (OCREVUS ) 300 MG/10ML injection Inject 20 mLs (600 mg total) into the vein every 6 (six) months.   [DISCONTINUED] rOPINIRole  (REQUIP ) 0.5 MG tablet Take 2 tablets (1 mg total) by mouth 3 (three) times daily.   No facility-administered encounter medications on file as of 01/02/2024.    No Known Allergies  Pertinent ROS per HPI, otherwise unremarkable      Objective:  BP 130/80   Pulse 63   Temp 97.6 F (36.4 C)   Ht 5' 3 (1.6 m)   Wt 180 lb 9.6 oz (81.9 kg)  LMP 10/03/2014 Comment: last period was approx 6 months ago, i don't have it anymore  SpO2 96%   BMI 31.99 kg/m    Wt Readings from Last 3 Encounters:  01/02/24 180 lb 9.6 oz (81.9 kg)  10/31/23 178 lb 8 oz (81 kg)  10/02/23 170 lb 12.8 oz (77.5 kg)    Physical Exam Vitals and nursing note reviewed.  Constitutional:      General: She is not in acute distress.    Appearance: Normal appearance. She is not ill-appearing, toxic-appearing or diaphoretic.  HENT:     Head: Normocephalic and atraumatic.     Nose: Nose normal.     Mouth/Throat:     Mouth: Mucous membranes are moist.  Eyes:     Pupils: Pupils are equal, round, and reactive to light.  Cardiovascular:     Rate and Rhythm: Normal rate and regular rhythm.     Heart sounds: Normal heart sounds.  Pulmonary:     Effort: Pulmonary effort is normal.     Breath sounds: Normal breath sounds.  Musculoskeletal:     Cervical back: Neck supple.     Right lower leg: No edema.     Left lower leg: No edema.  Skin:    General: Skin is warm and dry.     Capillary Refill: Capillary refill takes less than 2 seconds.  Neurological:     General: No focal deficit present.     Mental Status: She is alert and oriented to person, place, and time.     Gait: Gait abnormal (slow).  Psychiatric:        Mood and Affect: Mood normal.        Behavior: Behavior normal.        Thought Content: Thought content normal.        Judgment: Judgment normal.       Results for orders placed or performed in visit on 10/02/23  Bayer DCA Hb A1c Waived   Collection Time: 10/02/23  3:31 PM  Result Value Ref Range   HB A1C (BAYER DCA - WAIVED) 6.8 (H) 4.8 - 5.6 %       Pertinent labs & imaging results that were available during my care of the patient were reviewed by me and considered in my medical decision making.  Assessment & Plan:  Kemisha was seen today for medical management of chronic issues.  Diagnoses and all orders for this visit:  Type 2 diabetes mellitus with other specified complication, without long-term current use of insulin  (HCC) -     Bayer DCA Hb A1c Waived -     tirzepatide  (MOUNJARO ) 7.5 MG/0.5ML Pen; Inject 7.5 mg into the skin once a week.  Hypertension associated with type 2 diabetes mellitus (HCC) -     Bayer DCA Hb A1c Waived -     tirzepatide  (MOUNJARO ) 7.5 MG/0.5ML Pen; Inject 7.5 mg into the skin once a week.  Hyperlipidemia associated with type 2 diabetes mellitus (HCC) -     Bayer DCA Hb A1c Waived -     tirzepatide  (MOUNJARO ) 7.5 MG/0.5ML Pen; Inject 7.5 mg into the skin once a week.  Encounter for screening mammogram for malignant neoplasm of breast -     MM DIGITAL SCREENING BILATERAL; Future  Coronary artery disease involving native coronary artery of native heart without angina pectoris -     Bayer DCA Hb A1c Waived -     tirzepatide  (MOUNJARO ) 7.5 MG/0.5ML Pen; Inject 7.5 mg into the skin once  a week.  History of non-ST elevation myocardial infarction (NSTEMI) -     Bayer DCA Hb A1c Waived -     tirzepatide  (MOUNJARO ) 7.5 MG/0.5ML Pen; Inject 7.5 mg into the skin once a week.  Multiple sclerosis (HCC) -     Bayer DCA Hb A1c Waived  Morbid obesity (HCC) -     Bayer DCA Hb A1c Waived -     tirzepatide  (MOUNJARO ) 7.5 MG/0.5ML Pen; Inject 7.5 mg into the skin once a week.  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 Reports increased urination and thirst, suggesting hyperglycemia. Previous A1c was 6.8. Current medication regimen includes tirzepatide , with insurance coverage issues affecting access to medication. - Resume tirzepatide  starting at 2.5 mg and titrate up to 7.5 mg as tolerated. Samples provided in office today. - Check blood sugars regularly before increasing tirzepatide  dose. - Submit prescription for tirzepatide  7.5 mg to insurance for coverage determination. - Utilize coupon for one free month of tirzepatide  if insurance does not cover. - Consider alternative tirzepatide  option at $500/month if insurance issues persist.  Cataracts Cataracts present, with no immediate need for intervention. Vision corrected to 20/20 with glasses. Cataracts noted to be 5-10 years away from requiring treatment.  General Health Maintenance Routine health maintenance discussed, including vaccinations and screenings. Pneumonia and flu vaccines planned for today. Tetanus vaccine due, but deferred to next visit. Eye exam completed recently. - Administer pneumonia vaccine today. - Administer flu vaccine today. - Schedule tetanus vaccine for next visit. - Schedule mammogram based on her availability.          Continue all other maintenance medications.  Follow up plan: Return in about 3 months (around 04/03/2024), or if symptoms worsen or fail to improve, for HTN, DM, all labs.   Continue healthy lifestyle choices, including diet (rich in fruits, vegetables, and lean proteins, and low in salt and simple carbohydrates) and exercise (at least 30 minutes of moderate physical activity daily).  Educational handout given for DM  The above assessment and management plan was discussed with the  patient. The patient verbalized understanding of and has agreed to the management plan. Patient is aware to call the clinic if they develop any new symptoms or if symptoms persist or worsen. Patient is aware when to return to the clinic for a follow-up visit. Patient educated on when it is appropriate to go to the emergency department.   Rosaline Bruns, FNP-C [facility] [phone]

## 2024-01-03 ENCOUNTER — Ambulatory Visit: Payer: Self-pay | Admitting: Family Medicine

## 2024-01-09 ENCOUNTER — Encounter: Admitting: Vascular Surgery

## 2024-01-09 ENCOUNTER — Telehealth: Payer: Self-pay | Admitting: Neurology

## 2024-01-09 ENCOUNTER — Encounter (HOSPITAL_COMMUNITY)

## 2024-01-09 NOTE — Telephone Encounter (Signed)
 Called Stacy back. She is wondering where pt at in process of getting on Briumvi. Relayed latest update I see:   Aware I will reach out to [facility] to get update on things and then update her.   I called Delon at [phone]. Insurance told them they are OON and pt has to use their pharmacy: [facility]. She will email form needing completion. They will outsource to home infusion company within her insurance network once they receive orders.  Received below from Jennifer/[facility]:   I called pharmacy and spoke w/ rep. [facility] is insurance not pharmacy. Pharmacy [facility]. Paid claim on 01/06/24 via good year (insurance). I asked who initiated this. He placed me on hold. [facility] initiated. They were going to ship med to pt. Unsure who was going to do infusion.  Called [facility] at [phone], option 5 for prescriber. Spoke w/ Valesca. Transferred me to specialty department/Teresa. States order for Briumvi ok, about to into insurance verification stage.  (can take up to 72 hr) they will then call pt to get consent to ship from there to [facility]. I relayed to [facility].   I asked [facility] for update. They provided below. They will be doing pt infusion.

## 2024-01-09 NOTE — Telephone Encounter (Signed)
 Glade Ingles @ [facility]  is asking for a call back from April, RN she can be reached at [phone]

## 2024-01-09 NOTE — Telephone Encounter (Signed)
 I sent update to Stacy/Briumvi pt support.

## 2024-02-19 ENCOUNTER — Ambulatory Visit
Admission: RE | Admit: 2024-02-19 | Discharge: 2024-02-19 | Disposition: A | Source: Ambulatory Visit | Attending: Family Medicine

## 2024-02-19 ENCOUNTER — Other Ambulatory Visit: Payer: Self-pay | Admitting: Family Medicine

## 2024-02-19 DIAGNOSIS — G35D Multiple sclerosis, unspecified: Secondary | ICD-10-CM

## 2024-02-19 DIAGNOSIS — E1169 Type 2 diabetes mellitus with other specified complication: Secondary | ICD-10-CM

## 2024-02-19 DIAGNOSIS — I2510 Atherosclerotic heart disease of native coronary artery without angina pectoris: Secondary | ICD-10-CM

## 2024-02-19 DIAGNOSIS — Z1231 Encounter for screening mammogram for malignant neoplasm of breast: Secondary | ICD-10-CM

## 2024-02-19 DIAGNOSIS — E1159 Type 2 diabetes mellitus with other circulatory complications: Secondary | ICD-10-CM

## 2024-02-19 DIAGNOSIS — Z23 Encounter for immunization: Secondary | ICD-10-CM

## 2024-02-19 DIAGNOSIS — I252 Old myocardial infarction: Secondary | ICD-10-CM

## 2024-02-27 ENCOUNTER — Ambulatory Visit: Payer: Self-pay | Admitting: Family Medicine

## 2024-03-17 ENCOUNTER — Other Ambulatory Visit: Payer: Self-pay | Admitting: Neurology

## 2024-03-17 NOTE — Telephone Encounter (Signed)
 Last seen on 10/31/23 Follow up scheduled on 05/18/24

## 2024-03-23 ENCOUNTER — Other Ambulatory Visit: Payer: Self-pay | Admitting: Family

## 2024-03-23 DIAGNOSIS — F5101 Primary insomnia: Secondary | ICD-10-CM

## 2024-04-07 ENCOUNTER — Ambulatory Visit: Payer: Self-pay | Admitting: Family Medicine

## 2024-04-07 ENCOUNTER — Encounter: Payer: Self-pay | Admitting: Family Medicine

## 2024-04-07 VITALS — BP 133/85 | HR 78 | Temp 97.3°F | Ht 63.0 in | Wt 179.4 lb

## 2024-04-07 DIAGNOSIS — Z1211 Encounter for screening for malignant neoplasm of colon: Secondary | ICD-10-CM

## 2024-04-07 DIAGNOSIS — I152 Hypertension secondary to endocrine disorders: Secondary | ICD-10-CM

## 2024-04-07 DIAGNOSIS — Z1212 Encounter for screening for malignant neoplasm of rectum: Secondary | ICD-10-CM | POA: Diagnosis not present

## 2024-04-07 DIAGNOSIS — G35D Multiple sclerosis, unspecified: Secondary | ICD-10-CM

## 2024-04-07 DIAGNOSIS — E1159 Type 2 diabetes mellitus with other circulatory complications: Secondary | ICD-10-CM

## 2024-04-07 DIAGNOSIS — E1169 Type 2 diabetes mellitus with other specified complication: Secondary | ICD-10-CM | POA: Diagnosis not present

## 2024-04-07 DIAGNOSIS — E785 Hyperlipidemia, unspecified: Secondary | ICD-10-CM | POA: Diagnosis not present

## 2024-04-07 DIAGNOSIS — E66811 Obesity, class 1: Secondary | ICD-10-CM

## 2024-04-07 LAB — CMP14+EGFR
ALT: 36 IU/L — ABNORMAL HIGH (ref 0–32)
AST: 33 IU/L (ref 0–40)
Albumin: 4.3 g/dL (ref 3.8–4.9)
Alkaline Phosphatase: 72 IU/L (ref 49–135)
BUN/Creatinine Ratio: 13 (ref 9–23)
BUN: 10 mg/dL (ref 6–24)
Bilirubin Total: 0.6 mg/dL (ref 0.0–1.2)
CO2: 25 mmol/L (ref 20–29)
Calcium: 9 mg/dL (ref 8.7–10.2)
Chloride: 99 mmol/L (ref 96–106)
Creatinine, Ser: 0.77 mg/dL (ref 0.57–1.00)
Globulin, Total: 2.5 g/dL (ref 1.5–4.5)
Glucose: 179 mg/dL — ABNORMAL HIGH (ref 70–99)
Potassium: 4.3 mmol/L (ref 3.5–5.2)
Sodium: 139 mmol/L (ref 134–144)
Total Protein: 6.8 g/dL (ref 6.0–8.5)
eGFR: 90 mL/min/1.73

## 2024-04-07 LAB — CBC WITH DIFFERENTIAL/PLATELET
Basophils Absolute: 0.1 x10E3/uL (ref 0.0–0.2)
Basos: 1 %
EOS (ABSOLUTE): 0.2 x10E3/uL (ref 0.0–0.4)
Eos: 2 %
Hematocrit: 43.6 % (ref 34.0–46.6)
Hemoglobin: 15.1 g/dL (ref 11.1–15.9)
Immature Grans (Abs): 0 x10E3/uL (ref 0.0–0.1)
Immature Granulocytes: 0 %
Lymphocytes Absolute: 2.9 x10E3/uL (ref 0.7–3.1)
Lymphs: 37 %
MCH: 30.8 pg (ref 26.6–33.0)
MCHC: 34.6 g/dL (ref 31.5–35.7)
MCV: 89 fL (ref 79–97)
Monocytes Absolute: 0.7 x10E3/uL (ref 0.1–0.9)
Monocytes: 9 %
Neutrophils Absolute: 4 x10E3/uL (ref 1.4–7.0)
Neutrophils: 51 %
Platelets: 331 x10E3/uL (ref 150–450)
RBC: 4.91 x10E6/uL (ref 3.77–5.28)
RDW: 12.2 % (ref 11.7–15.4)
WBC: 7.9 x10E3/uL (ref 3.4–10.8)

## 2024-04-07 LAB — LIPID PANEL
Chol/HDL Ratio: 3.8 ratio (ref 0.0–4.4)
Cholesterol, Total: 137 mg/dL (ref 100–199)
HDL: 36 mg/dL — ABNORMAL LOW
LDL Chol Calc (NIH): 82 mg/dL (ref 0–99)
Triglycerides: 101 mg/dL (ref 0–149)
VLDL Cholesterol Cal: 19 mg/dL (ref 5–40)

## 2024-04-07 LAB — BAYER DCA HB A1C WAIVED: HB A1C (BAYER DCA - WAIVED): 9.8 % — ABNORMAL HIGH (ref 4.8–5.6)

## 2024-04-07 LAB — TSH: TSH: 1.32 u[IU]/mL (ref 0.450–4.500)

## 2024-04-07 LAB — T4, FREE: Free T4: 1.22 ng/dL (ref 0.82–1.77)

## 2024-04-07 MED ORDER — RYBELSUS 7 MG PO TABS: 7.0000 mg | ORAL_TABLET | Freq: Every day | ORAL | 1 refills | Status: AC

## 2024-04-07 NOTE — Patient Instructions (Addendum)

## 2024-04-07 NOTE — Progress Notes (Signed)
 "    Subjective:  Patient ID: Kaylee Bryant, female    DOB: 1965/10/04, 58 y.o.   MRN: [id number]  Patient Care Team: Severa Rock HERO, FNP as PCP - General (Family Medicine)   Chief Complaint:  Diabetes (3 month follow up )   HPI: Kaylee Bryant is a 59 y.o. female presenting on 04/07/2024 for Diabetes (3 month follow up )   Discussed the use of AI scribe software for clinical note transcription with the patient, who gave verbal consent to proceed.  History of Present Illness   Kaylee Bryant is a 59 year old female with multiple sclerosis and diabetes who presents with difficulty obtaining medications.  She has been experiencing difficulty obtaining her medications, particularly her diabetes medication, Mounjaro , and her multiple sclerosis (MS) medication. She has been unable to access her MS medication for over a year and has experienced increased falls over the past few months. [facility] provided her with a contact number for her insurance company to resolve the issue, but her night shifts make it challenging to call during business hours.  Previously, she was on 7.5 mg of Mounjaro  for diabetes management but has been unable to continue due to insurance coverage issues. Her blood sugar levels are not well-controlled, and she has not been checking them regularly due to the high cost of glucose monitoring supplies. Her last hemoglobin A1c was 9.8%.  She continues to take her other medications, including atorvastatin , blood pressure medications, a beta blocker, and Effexor , without issues.  She works 12-hour night shifts, which impacts her ability to manage her health care needs effectively.  No chest pain, swelling, or shortness of breath.          Relevant past medical, surgical, family, and social history reviewed and updated as indicated.  Allergies and medications reviewed and updated. Data reviewed: Chart in Epic.   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   LIMA to LAD off pump    Past Surgical History:  Procedure Laterality Date   CARDIAC CATHETERIZATION N/A 04/05/2015   Procedure: Left Heart Cath and Coronary Angiography;  Surgeon: Debby DELENA Sor, MD;  Location: [facility] INVASIVE CV LAB;  Service: Cardiovascular;  Laterality: N/A;   CERVICAL CONE BIOPSY  2004   CESAREAN SECTION     1996, 2006   CHOLECYSTECTOMY     1988   COLPOSCOPY  2004   CORONARY ARTERY BYPASS GRAFT N/A 04/07/2015   Procedure: CORONARY ARTERY BYPASS GRAFTING (CABG), OFF PUMP, TIMES ONE, USING LEFT INTERNAL MAMMARY ARTERY;  Surgeon: Sudie VEAR Laine, MD;  Location: [facility] OR;  Service: Open Heart Surgery;  Laterality: N/A;  LIMA to LAD   TEE WITHOUT CARDIOVERSION N/A 04/07/2015   Procedure: TRANSESOPHAGEAL ECHOCARDIOGRAM (TEE);  Surgeon: Sudie VEAR Laine, MD;  Location: [facility] OR;  Service: Open Heart Surgery;  Laterality: N/A;    Social History   Socioeconomic History   Marital status: Married    Spouse name: Not on file   Number of children:  4   Years of education: college   Highest education level: Not on file  Occupational History   Occupation: [employer]    Employer: [employer]  Tobacco Use   Smoking status: Former    Current packs/day: 0.00    Average packs/day: 1.5 packs/day for 15.0 years (22.5 ttl pk-yrs)    Types: Cigarettes    Start date: 02/02/1989    Quit date: 02/03/2004    Years since quitting: 20.1   Smokeless tobacco: Never  Substance and Sexual Activity   Alcohol use: Yes    Alcohol/week: 1.0 standard drink of alcohol    Types: 1 Glasses of wine per week   Drug  use: No   Sexual activity: Yes    Partners: Male    Birth control/protection: Surgical    Comment: vasectomy  Other Topics Concern   Not on file  Social History Narrative   Patient lives at home with her husband and children. Patient works for the [employer] in [place]. As a engineer, civil (consulting). College education.    Social Drivers of Health   Tobacco Use: Medium Risk (04/07/2024)   Patient History    Smoking Tobacco Use: Former    Smokeless Tobacco Use: Never    Passive Exposure: Not on Actuary Strain: Not on file  Food Insecurity: Not on file  Transportation Needs: Not on file  Physical Activity: Not on file  Stress: Not on file  Social Connections: Not on file  Intimate Partner Violence: Not on file  Depression (PHQ2-9): Low Risk (04/07/2024)   Depression (PHQ2-9)    PHQ-2 Score: 2  Alcohol Screen: Not on file  Housing: Not on file  Utilities: Not on file  Health Literacy: Not on file    Outpatient Encounter Medications as of 04/07/2024  Medication Sig   acetaminophen  (TYLENOL ) 500 MG tablet Take 1,000 mg by mouth every 6 (six) hours as needed for mild pain.   aspirin  EC 81 MG tablet Take 81 mg by mouth daily.   atorvastatin  (LIPITOR ) 80 MG tablet TAKE 1 TABLET BY MOUTH DAILY AT 6 PM.   baclofen  (LIORESAL ) 10 MG tablet TAKE 2 TABLETS BY MOUTH 3 TIMES A DAY   celecoxib  (CELEBREX ) 50 MG capsule Take 1 capsule (50 mg total) by mouth 2 (two) times daily as needed for pain.   gabapentin  (NEURONTIN ) 300 MG capsule TAKE 2 CAPSULES BY MOUTH 3 TIMES DAILY.   lisinopril  (ZESTRIL ) 10 MG tablet TAKE 1 TABLET BY MOUTH TWICE A DAY   metoprolol  tartrate (LOPRESSOR ) 25 MG tablet TAKE 1 TABLET BY MOUTH TWICE A DAY   Semaglutide  (RYBELSUS ) 7 MG TABS Take 1 tablet (7 mg total) by mouth daily.   traMADol  (ULTRAM ) 50 MG tablet Take 1 tablet (50 mg total) by mouth every 6 (six) hours as needed.   traZODone  (DESYREL ) 50 MG tablet TAKE 1 TABLET BY MOUTH EVERYDAY AT BEDTIME   venlafaxine   XR (EFFEXOR -XR) 150 MG 24 hr capsule Take 1 capsule (150 mg total) by mouth daily with breakfast.   venlafaxine  XR (EFFEXOR -XR) 75 MG 24 hr capsule Take 1 capsule (75 mg total) by mouth daily with breakfast.   [DISCONTINUED] tirzepatide  (MOUNJARO ) 7.5 MG/0.5ML Pen Inject 7.5 mg into the skin once a week.   No facility-administered encounter medications on file as of 04/07/2024.    Allergies[1]  Pertinent ROS per HPI, otherwise unremarkable      Objective:  BP 133/85   Pulse 78   Temp (!) 97.3  F (36.3 C)   Ht 5' 3 (1.6 m)   Wt 179 lb 6.4 oz (81.4 kg)   LMP 10/03/2014 Comment: last period was approx 6 months ago, i don't have it anymore  SpO2 97%   BMI 31.78 kg/m    Wt Readings from Last 3 Encounters:  04/07/24 179 lb 6.4 oz (81.4 kg)  01/02/24 180 lb 9.6 oz (81.9 kg)  10/31/23 178 lb 8 oz (81 kg)    Physical Exam Vitals and nursing note reviewed.  Constitutional:      Appearance: Normal appearance. She is obese.  HENT:     Head: Normocephalic and atraumatic.     Nose: Nose normal.     Mouth/Throat:     Mouth: Mucous membranes are moist.  Eyes:     Pupils: Pupils are equal, round, and reactive to light.  Cardiovascular:     Rate and Rhythm: Normal rate and regular rhythm.     Pulses:          Dorsalis pedis pulses are 2+ on the right side and 2+ on the left side.       Posterior tibial pulses are 2+ on the right side and 2+ on the left side.     Heart sounds: Normal heart sounds.  Pulmonary:     Effort: Pulmonary effort is normal.     Breath sounds: Normal breath sounds.  Musculoskeletal:     Cervical back: Neck supple.     Right lower leg: No edema.     Left lower leg: No edema.     Right foot: Normal range of motion. No deformity, bunion, Charcot foot, foot drop or prominent metatarsal heads.     Left foot: Normal range of motion. No deformity, bunion, Charcot foot, foot drop or prominent metatarsal heads.  Feet:     Right foot:     Protective Sensation:  10 sites tested.  8 sites sensed.     Skin integrity: Dry skin present.     Toenail Condition: Right toenails are normal.     Left foot:     Protective Sensation: 10 sites tested.  8 sites sensed.     Skin integrity: Dry skin present.     Toenail Condition: Left toenails are normal.  Skin:    General: Skin is warm and dry.     Capillary Refill: Capillary refill takes less than 2 seconds.  Neurological:     General: No focal deficit present.     Mental Status: She is alert and oriented to person, place, and time.     Motor: Weakness (chronic, MS history) present.     Gait: Gait abnormal (slow).  Psychiatric:        Mood and Affect: Mood normal.        Behavior: Behavior normal.        Thought Content: Thought content normal.        Judgment: Judgment normal.      Results for orders placed or performed in visit on 01/02/24  Bayer DCA Hb A1c Waived   Collection Time: 01/02/24  2:52 PM  Result Value Ref Range   HB A1C (BAYER DCA - WAIVED) 8.2 (H) 4.8 - 5.6 %       Pertinent labs & imaging results that were available during my care of the patient were reviewed by me and considered in my medical decision making.  Assessment & Plan:  Kaylee Bryant was seen today for diabetes.  Diagnoses and all orders for this  visit:  Type 2 diabetes mellitus with other specified complication, without long-term current use of insulin  (HCC) -     Bayer DCA Hb A1c Waived -     CBC with Differential/Platelet -     CMP14+EGFR -     Lipid panel -     TSH -     T4, free -     Microalbumin / creatinine urine ratio -     Semaglutide  (RYBELSUS ) 7 MG TABS; Take 1 tablet (7 mg total) by mouth daily.  Hypertension associated with type 2 diabetes mellitus (HCC) -     Bayer DCA Hb A1c Waived -     CBC with Differential/Platelet -     CMP14+EGFR -     Lipid panel -     TSH -     T4, free -     Microalbumin / creatinine urine ratio -     Semaglutide  (RYBELSUS ) 7 MG TABS; Take 1 tablet (7 mg total) by mouth  daily.  Hyperlipidemia associated with type 2 diabetes mellitus (HCC) -     Bayer DCA Hb A1c Waived -     CBC with Differential/Platelet -     CMP14+EGFR -     Lipid panel -     TSH -     T4, free -     Microalbumin / creatinine urine ratio -     Semaglutide  (RYBELSUS ) 7 MG TABS; Take 1 tablet (7 mg total) by mouth daily.  Screening for colorectal cancer -     Ambulatory referral to Gastroenterology  Multiple sclerosis (HCC) -     CBC with Differential/Platelet -     CMP14+EGFR  Morbid obesity (HCC) -     Bayer DCA Hb A1c Waived -     CBC with Differential/Platelet -     CMP14+EGFR -     Lipid panel -     TSH -     T4, free      Type 2 diabetes mellitus with other specified complication Type 2 diabetes mellitus with complications, including difficulty obtaining medication due to insurance issues. Current A1c is 9.8, indicating suboptimal glycemic control. She has not been regularly checking blood sugars due to cost concerns. - Prescribed Rybelsus  7 mg orally, to be taken in the morning on an empty stomach with a full glass of water. - [facility] and [facility] coverage. - Provided Ozempic  injectables as an alternative if Rybelsus  is not covered. - Advised to contact if Rybelsus  is not covered for alternative options.  Multiple sclerosis Recent exacerbation due to inability to obtain medication for over a year. Reports increased falls and some sensory deficits.  Morbid obesity Management complicated by insurance issues affecting medication access. Previous treatment with Mounjaro  was not effective, and she is unable to obtain it due to insurance problems. - Prescribed Rybelsus  as an alternative to Mounjaro  for weight management.  General Health Maintenance She prefers not to return to previous provider and is open to scheduling at [facility]. - Scheduled colonoscopy at [facility].          Continue all other maintenance medications.  Follow up  plan: Return in 3 months (on 07/06/2024), or if symptoms worsen or fail to improve, for DM.   Continue healthy lifestyle choices, including diet (rich in fruits, vegetables, and lean proteins, and low in salt and simple carbohydrates) and exercise (at least 30 minutes of moderate physical activity daily).  Educational handout given for DM  The above  assessment and management plan was discussed with the patient. The patient verbalized understanding of and has agreed to the management plan. Patient is aware to call the clinic if they develop any new symptoms or if symptoms persist or worsen. Patient is aware when to return to the clinic for a follow-up visit. Patient educated on when it is appropriate to go to the emergency department.   Rosaline Bruns, FNP-C [facility] [phone]     [1] No Known Allergies  "

## 2024-04-09 ENCOUNTER — Ambulatory Visit: Admitting: Family Medicine

## 2024-04-24 ENCOUNTER — Other Ambulatory Visit: Payer: Self-pay | Admitting: Neurology

## 2024-04-27 NOTE — Telephone Encounter (Signed)
 Last seen on 10/31/23 Follow up scheduled on 05/18/24

## 2024-05-18 ENCOUNTER — Ambulatory Visit: Admitting: Neurology

## 2024-07-08 ENCOUNTER — Ambulatory Visit: Admitting: Family Medicine
